# Patient Record
Sex: Male | Born: 1945 | Race: White | Hispanic: No | Marital: Married | Smoking: Never smoker
Health system: Southern US, Community
[De-identification: ages and names within clinical notes are randomized; demographics above are authoritative.]

## PROBLEM LIST (undated history)

## (undated) DIAGNOSIS — R519 Headache, unspecified: Secondary | ICD-10-CM

## (undated) DIAGNOSIS — J189 Pneumonia, unspecified organism: Secondary | ICD-10-CM

## (undated) DIAGNOSIS — I2510 Atherosclerotic heart disease of native coronary artery without angina pectoris: Secondary | ICD-10-CM

## (undated) DIAGNOSIS — I517 Cardiomegaly: Secondary | ICD-10-CM

## (undated) DIAGNOSIS — R062 Wheezing: Secondary | ICD-10-CM

## (undated) DIAGNOSIS — I10 Essential (primary) hypertension: Secondary | ICD-10-CM

## (undated) DIAGNOSIS — N471 Phimosis: Secondary | ICD-10-CM

## (undated) DIAGNOSIS — E785 Hyperlipidemia, unspecified: Secondary | ICD-10-CM

## (undated) DIAGNOSIS — R55 Syncope and collapse: Secondary | ICD-10-CM

## (undated) DIAGNOSIS — R0600 Dyspnea, unspecified: Secondary | ICD-10-CM

## (undated) DIAGNOSIS — K219 Gastro-esophageal reflux disease without esophagitis: Secondary | ICD-10-CM

## (undated) DIAGNOSIS — M1990 Unspecified osteoarthritis, unspecified site: Secondary | ICD-10-CM

## (undated) DIAGNOSIS — Z5189 Encounter for other specified aftercare: Secondary | ICD-10-CM

## (undated) DIAGNOSIS — Z973 Presence of spectacles and contact lenses: Secondary | ICD-10-CM

## (undated) DIAGNOSIS — C4491 Basal cell carcinoma of skin, unspecified: Secondary | ICD-10-CM

## (undated) DIAGNOSIS — I209 Angina pectoris, unspecified: Secondary | ICD-10-CM

## (undated) DIAGNOSIS — D179 Benign lipomatous neoplasm, unspecified: Secondary | ICD-10-CM

## (undated) DIAGNOSIS — A159 Respiratory tuberculosis unspecified: Secondary | ICD-10-CM

## (undated) HISTORY — DX: Hyperlipidemia, unspecified: E78.5

## (undated) HISTORY — PX: COLONOSCOPY: SHX174

## (undated) HISTORY — DX: Gastro-esophageal reflux disease without esophagitis: K21.9

## (undated) HISTORY — DX: Atherosclerotic heart disease of native coronary artery without angina pectoris: I25.10

## (undated) HISTORY — DX: Encounter for other specified aftercare: Z51.89

## (undated) HISTORY — DX: Essential (primary) hypertension: I10

## (undated) HISTORY — PX: JOINT REPLACEMENT: SHX530

## (undated) HISTORY — PX: CARDIAC CATHETERIZATION: SHX172

## (undated) HISTORY — PX: CORONARY ARTERY BYPASS GRAFT: SHX141

## (undated) HISTORY — DX: Respiratory tuberculosis unspecified: A15.9

## (undated) HISTORY — PX: BACK SURGERY: SHX140

## (undated) HISTORY — PX: EYE SURGERY: SHX253

## (undated) HISTORY — PX: CORONARY ANGIOPLASTY: SHX604

---
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
## 1996-03-10 HISTORY — PX: LUMBAR DISC SURGERY: SHX700

## 1999-01-17 ENCOUNTER — Other Ambulatory Visit: Admission: RE | Admit: 1999-01-17 | Discharge: 1999-01-17 | Payer: Self-pay | Admitting: Orthopedic Surgery

## 1999-03-11 HISTORY — PX: KNEE ARTHROSCOPY: SUR90

## 1999-04-25 ENCOUNTER — Encounter: Payer: Self-pay | Admitting: Orthopedic Surgery

## 1999-04-25 ENCOUNTER — Ambulatory Visit (HOSPITAL_COMMUNITY): Admission: RE | Admit: 1999-04-25 | Discharge: 1999-04-25 | Payer: Self-pay | Admitting: Orthopedic Surgery

## 2001-03-10 DIAGNOSIS — Z8719 Personal history of other diseases of the digestive system: Secondary | ICD-10-CM

## 2001-03-10 HISTORY — DX: Personal history of other diseases of the digestive system: Z87.19

## 2001-10-15 ENCOUNTER — Ambulatory Visit (HOSPITAL_COMMUNITY): Admission: RE | Admit: 2001-10-15 | Discharge: 2001-10-15 | Payer: Self-pay | Admitting: Internal Medicine

## 2001-10-15 ENCOUNTER — Encounter: Payer: Self-pay | Admitting: Internal Medicine

## 2004-03-08 ENCOUNTER — Inpatient Hospital Stay (HOSPITAL_COMMUNITY): Admission: EM | Admit: 2004-03-08 | Discharge: 2004-03-19 | Payer: Self-pay | Admitting: Emergency Medicine

## 2004-03-10 DIAGNOSIS — Z5189 Encounter for other specified aftercare: Secondary | ICD-10-CM

## 2004-03-10 HISTORY — DX: Encounter for other specified aftercare: Z51.89

## 2004-03-10 HISTORY — PX: OTHER SURGICAL HISTORY: SHX169

## 2004-04-05 ENCOUNTER — Encounter
Admission: RE | Admit: 2004-04-05 | Discharge: 2004-04-05 | Payer: Self-pay | Admitting: Thoracic Surgery (Cardiothoracic Vascular Surgery)

## 2004-04-08 ENCOUNTER — Encounter (HOSPITAL_COMMUNITY): Admission: RE | Admit: 2004-04-08 | Discharge: 2004-07-07 | Payer: Self-pay | Admitting: *Deleted

## 2004-12-23 ENCOUNTER — Ambulatory Visit: Payer: Self-pay | Admitting: Internal Medicine

## 2005-12-30 ENCOUNTER — Ambulatory Visit: Payer: Self-pay | Admitting: Internal Medicine

## 2006-03-10 DIAGNOSIS — Z860100 Personal history of colon polyps, unspecified: Secondary | ICD-10-CM

## 2006-03-10 HISTORY — DX: Personal history of colonic polyps: Z86.010

## 2006-03-10 HISTORY — DX: Personal history of colon polyps, unspecified: Z86.0100

## 2006-12-21 ENCOUNTER — Ambulatory Visit: Payer: Self-pay | Admitting: Internal Medicine

## 2006-12-29 ENCOUNTER — Ambulatory Visit: Payer: Self-pay | Admitting: Internal Medicine

## 2006-12-29 ENCOUNTER — Encounter: Payer: Self-pay | Admitting: Internal Medicine

## 2008-03-10 HISTORY — PX: ANGIOPLASTY: SHX39

## 2008-04-03 ENCOUNTER — Ambulatory Visit (HOSPITAL_COMMUNITY): Admission: RE | Admit: 2008-04-03 | Discharge: 2008-04-04 | Payer: Self-pay | Admitting: Cardiology

## 2008-06-14 ENCOUNTER — Observation Stay (HOSPITAL_COMMUNITY): Admission: AD | Admit: 2008-06-14 | Discharge: 2008-06-15 | Payer: Self-pay | Admitting: Cardiology

## 2010-03-10 HISTORY — PX: CATARACT EXTRACTION W/ INTRAOCULAR LENS  IMPLANT, BILATERAL: SHX1307

## 2010-06-19 LAB — CARDIAC PANEL(CRET KIN+CKTOT+MB+TROPI): Relative Index: 4.7 — ABNORMAL HIGH (ref 0.0–2.5)

## 2010-06-19 LAB — CBC
HCT: 42.1 % (ref 39.0–52.0)
HCT: 43.4 % (ref 39.0–52.0)
Hemoglobin: 15 g/dL (ref 13.0–17.0)
Hemoglobin: 15 g/dL (ref 13.0–17.0)
MCHC: 34.6 g/dL (ref 30.0–36.0)
MCHC: 35.6 g/dL (ref 30.0–36.0)
RBC: 4.35 MIL/uL (ref 4.22–5.81)
RDW: 13.1 % (ref 11.5–15.5)
RDW: 13.3 % (ref 11.5–15.5)

## 2010-06-19 LAB — BASIC METABOLIC PANEL
BUN: 14 mg/dL (ref 6–23)
Calcium: 8.8 mg/dL (ref 8.4–10.5)
GFR calc non Af Amer: >60 mL/min (ref >60)
Glucose, Bld: 112 mg/dL — ABNORMAL HIGH (ref 70–99)

## 2010-06-24 LAB — BASIC METABOLIC PANEL
BUN: 13 mg/dL (ref 6–23)
CO2: 26 mEq/L (ref 19–32)
GFR calc non Af Amer: >60 mL/min (ref >60)
Glucose, Bld: 93 mg/dL (ref 70–99)
Potassium: 3.7 mEq/L (ref 3.5–5.1)

## 2010-06-24 LAB — CBC
HCT: 42.8 % (ref 39.0–52.0)
MCHC: 34.8 g/dL (ref 30.0–36.0)
Platelets: 172 10*3/uL (ref 150–400)
RDW: 12.3 % (ref 11.5–15.5)

## 2010-07-23 NOTE — Cardiovascular Report (Signed)
NAMEOWYN, RAULSTON               ACCOUNT NO.:  [id number]   MEDICAL RECORD NO.:  [id number]          PATIENT TYPE:  INP   LOCATION:  2503                         FACILITY:  [facility]   PHYSICIAN:  Cristy Hilts. Jacinto Halim, MD       DATE OF BIRTH:  March 22, 1945   DATE OF PROCEDURE:  04/03/2008  DATE OF DISCHARGE:                            CARDIAC CATHETERIZATION   PROCEDURE PERFORMED:  PTCA and stenting of the saphenous vein graft to  obtuse marginal which has a skip graft supplying the diagonal 1 which is  a free RIMA that attaches from the saphenous vein graft to the diagonal  1.  There is stenosis in the ostium of this bypass graft.   INDICATIONS:  Mr. Terelle Dobler is a pleasant 65 year old gentleman who  has been complaining of exertional chest pain similar to his angina  pectoris prior to his bypass surgery.  He had undergone diagnostic  cardiac catheterization at [facility] on March 15, 2008.  This had revealed a high-grade stenosis in saphenous vein graft to the  obtuse marginal.  Given the fact that this supplied 2 vessels that is  the diagonal 1 and obtuse marginal, he is now brought to the cardiac  catheterization lab for elective angioplasty.   INTERVENTIONAL DATA:  Successful PTCA and stenting of the proximal  segment of the saphenous vein graft with implantation of a 4.0 x 15-mm  Driver stent which was deployed at peak of 14 atmospheric pressure.  The  stenosis was reduced from 80% to 0% with brisk TIMI 3 to TIMI 3 flow  maintained at the end of the procedure.   RECOMMENDATIONS:  The patient will be continued on aspirin and Plavix.  He will be discharged home in the morning if he remains stable.  A total  of 85 mL of contrast was utilized for diagnostic angiography.   As far as the right coronary artery stenosis is concerned, I reviewed  the cineangiograms with my colleagues, there is dual supply of the PDA  and PLA branches and the stenosis proximal to the graft  insertion does  not appear to be significant as this retrogradely fills the PLA branch.  Hence, this is to be left alone.   I suspect he will have significant improvement in his anginal symptoms.   TECHNIQUE OF THE PROCEDURE:  Under usual sterile precautions using a 7-  French right femoral arterial access, initially a 7-French hockey-stick  guide and then eventually a 7-French AL-1 and then a 7-French LCB  catheter was utilized to engage the saphenous vein graft.  Using  Angiomax for anticoagulation, a 190 cm x 0.014th of an inch Asahi  Prowater guidewire was utilized and the lesion was easily crossed.  The  lesion was predilated with a 3.5 x 12-mm Sprinter balloon at 14  atmospheric pressure followed by stenting with a 4.0 x 15-mm Driver  stent.  The same stent balloon was utilized for performing multiple  balloon inflations at a peak of 14 atmospheric pressure for 35-40  seconds.  Intracoronary nitroglycerin was administered.  Angiography was  performed.  Excellent  results were noted.  The guidewire was withdrawn.  Guide catheter disengaged and pulled out of body.   Right femoral arteriography was performed through the arterial access  sheath and access was closed with StarClose with excellent hemostasis.      Cristy Hilts. Jacinto Halim, MD  Electronically Signed     JRG/MEDQ  D:  04/03/2008  T:  04/04/2008  Job:  [id number]   cc:   Gaspar Garbe, M.D.

## 2010-07-23 NOTE — Discharge Summary (Signed)
[name], [name]               ACCOUNT NO.:  [id number]   MEDICAL RECORD NO.:  [id number]          PATIENT TYPE:  INP   LOCATION:  2504                         FACILITY:  [facility]   PHYSICIAN:  Cristy Hilts. Jacinto Halim, MD       DATE OF BIRTH:  Nov 08, 1945   DATE OF ADMISSION:  06/14/2008  DATE OF DISCHARGE:  06/15/2008                               DISCHARGE SUMMARY   DISCHARGE DIAGNOSES:  1. Recurrent angina despite medication titration.  2. Known coronary artery disease with history of CABG in 2006 with      LIMA to LAD, SVG to PDA, SVG to diagonal 1 and OM1, recent status      post PTCA and stenting of his  skip vein graft on April 03, 2008,      with implantation of non drug-eluting stent.  3. Recath secondary to recurrent angina despite medication titration      on June 14, 2008, electively revealing high grade and his distal      RCA and mid RCA with stenting using a Zion 3.0 x 18 and a Zion 3.5      x 15.  His RIMA to his diagonal was patent with patent stents on      April 03, 2008 and his SVG to his OM1 had some stenosis with a      valve abnormality.  His LIMA to his LAD was patent.  He did have 80-      90% in his proximal LAD.  4. Hyperlipidemia.  5. Hypertension.  6. ED.   DISCHARGE MEDICATIONS:  1. Prevacid 30 mg every day.  2. Metoprolol tartrate 25 mg every day.  3. Ramipril 5 mg every day.  4. Crestor 20 mg half every day.  5. Niaspan 1000 mg two every day.  6. Fish oil 1000 mg two every day.  7. Aspirin 325 mg a day.  8. Zetia 10 mg a day.  9. Nitroglycerin p.r.n. for chest discomfort.  10.Plavix 75 mg a day.  11.fish oil 1000 mg two a day.  12.Tylenol p.r.n.  13.Docusate sodium 100 mg a day.  14.CoQ10 50 mg twice a day.  15.Multivitamin and Vitamin C daily.   DISCHARGE INSTRUCTIONS:  The patient should do no strenuous activity,  pushing, pulling, or extended walking for 1 week.  He has an appointment  with Dr. Jacinto Halim on June 23, 2008 at 3:45.   HOSPITAL  COURSE:  Mr. Tobler is a 65 year old male who had known CAD  who was seen in my office by Dr. Jacinto Halim.  He is complaining of a chest  pain.  It was nitro-responsive chest pain like a band across his chest.  It was felt that he should undergo cardiac catheterization.  He was  having lifestyle-limiting chest pain.  He underwent cath on June 14, 2008 electively.  He was found to have high-grade to his distal and mid  RCA.  He had in his skip graft of SVG to his OM1.  He did have some area  of stenosis in vein valve that was somewhat obtrusive.  He underwent  stenting to his high grade RCA, and he did well.  He was seen by cardiac  rehab the following day.  His right groin was stable.  His blood  pressure was stable.  Dr. Jacinto Halim decided that he could be discharged  home.  If he continued to have chest pain, then he would pursue a PCI to  his skip vein graft to his OMs.      Lezlie Octave, N.P.      Cristy Hilts. Jacinto Halim, MD  Electronically Signed    BB/MEDQ  D:  06/15/2008  T:  06/16/2008  Job:  [id number]   cc:   Gaspar Garbe, M.D.

## 2010-07-26 NOTE — Discharge Summary (Signed)
NAMEANSAR, SKODA               ACCOUNT NO.:  [id number]   MEDICAL RECORD NO.:  [id number]          PATIENT TYPE:  INP   LOCATION:  2005                         FACILITY:  [facility]   PHYSICIAN:  Salvatore Decent. Dorris Fetch, M.D.DATE OF BIRTH:  14-Sep-1945   DATE OF ADMISSION:  03/08/2004  DATE OF DISCHARGE:  03/19/2004                                 DISCHARGE SUMMARY   HISTORY OF PRESENT ILLNESS:  This is a 65 year old white male with a past  medical history of hypertension, remote TB, surgical repair to herniated  disk, arthroscopic knee surgery, who presented to [facility] emergency  department with chest pain in the mid substernal region radiating to the  left chest and left arm.  It was associated with shortness of breath,  nausea, vomiting, and diaphoresis.  It was exertional, relieved with rest  after about 1 hour. The patient first developed symptoms approximately 3  weeks ago but they have become increasingly frequent and severe.  On initial  evaluation he was chest pain free after initial emergency department prompt  treatment with nitroglycerin.  He was felt to require further evaluation and  treatment for probable cardiac disease.   PAST MEDICAL HISTORY:  As noted above.   ALLERGIES:  CODEINE (causes nausea), PENICILLIN (causes rash).   MEDICATIONS ON ADMISSION:  1.  Lipitor 10 mg daily.  2.  Hydrochlorothiazide 25 mg daily.  3.  Prevacid 30 mg daily.  4.  Vitamin C 1000 mg daily.  5.  D.O.S. 100 mg b.i.d.  6.  Viagra p.r.n.  7.  Lamisil p.r.n.  8.  Ibuprofen p.r.n.  9.  Tylenol p.r.n.   For family history, social history, review of systems and physical exam  please see the history and physical done at the time of admission.   HOSPITAL COURSE:  The patient was admitted emergently for unstable angina  and rule out myocardial infarction. Beta blocker was initially held  secondary to heart rate in the low 60's. He was started on intravenous  heparin and nitroglycerin,  aspirin, continued on statin and was scheduled  for cardiac catheterization.  The patient did rule in for myocardial damage  by troponin with peak at 0.22.  He remained chest pain free up until  catheterization on March 12, 2004.  This showed significant multi-vessel  coronary artery disease. Please see the detailed catheterization report for  all details. Due to the findings, surgical consultation was obtained with  Viviann Spare C. Dorris Fetch, M.D. who evaluated the patient and studies. Please  see his consultation for further details, but he was noted to be an  acceptable candidate for surgical revascularization.  He was scheduled for  the procedure and on March 14, 2004 he was taken to the operating room  where he underwent the following procedure: Coronary artery bypass grafting  x4. The following grafts were placed:  1.  Left internal mammary artery to the LAD.  2.  Right internal mammary artery as a free graft to the diagonal.  3.  Saphenous vein graft to the obtuse marginal.  4.  Saphenous vein graft to the posterior descending.  The patient tolerated the procedure well and was taken to the surgical  intensive care unit in stable condition.   POSTOPERATIVE HOSPITAL COURSE:  The patient has done well. Significant  issues however were postoperative anemia for which he did require  transfusion.  He is felt to be clinically stable. He did not tolerate iron.  His most recent hemoglobin and hematocrit dated March 19, 2004 is 8.6 and  24.5.  He additionally has had some difficulty with nausea and medications  had been readjusted.  He does not appear to tolerate pain medications very  well due to this nausea.  Additionally as stated, the Niferex iron  supplement was discontinued.   Other laboratories are stable. BUN and creatinine are within normal limits  at 15 and 1.0 on March 18, 2004.  He has tolerated a gentle diuresis. All  incisions are healing well without signs of infection.  He has tolerated  routine pulmonary toilet and oxygen has been weaned, maintaining good oxygen  saturations on room air. His cardiac rhythm is normal sinus rhythm and he  has had no significant ectopy or dysrhythmia postoperatively. His  rehabilitation has gone nicely and he has tolerated routine cardiac  rehabilitation phase I modalities without problem. His current status is  felt to be quite stable for discharge on March 19, 2004.   DISCHARGE MEDICATIONS:  1.  Aspirin 325 mg daily.  2.  Lopressor 25 mg b.i.d.  3.  Zocor 80 mg daily.  4.  He is also instructed to resume his Prevacid and can resume his vitamins      as well.  5.  For pain Ultram 50 mg 1-2 q.6h p.r.n. He is instructed to take this with      food.   ACTIVITY:  He is given written instructions regarding activities, diet,  wound care and follow up.   FOLLOW UP:  Dr. Dorris Fetch in 3 weeks, the office will call with this.  He is also instructed to see his cardiologist, Dr. Domingo Sep in 2 weeks.   FINAL DIAGNOSES:  1.  Severe multi-vessel coronary artery disease as described. No status post      surgical revascularization.  He did have unstable angina on admission      with positive troponin I enzymes.  2.  Hypertension.  3.  Hyperlipidemia.  4.  Previous surgeries as described.   CONDITION ON DISCHARGE:  Stable and improved.       WEG/MEDQ  D:  03/19/2004  T:  03/19/2004  Job:  [id number]

## 2010-07-26 NOTE — Cardiovascular Report (Signed)
NAMEJAYTHAN, HINELY               ACCOUNT NO.:  [id number]   MEDICAL RECORD NO.:  [id number]          PATIENT TYPE:  INP   LOCATION:  4705                         FACILITY:  [facility]   PHYSICIAN:  Cristy Hilts. Jacinto Halim, MD       DATE OF BIRTH:  02-12-1946   DATE OF PROCEDURE:  03/12/2004  DATE OF DISCHARGE:                              CARDIAC CATHETERIZATION   REFERRING PHYSICIAN:  [facility] [address].   PROCEDURE PERFORMED:  1.  Left ventriculography.  2.  Selective right-to-left coronary arteriography.  3.  Unbypassed left internal mammary artery and right internal mammary      artery arteriography including visualization of right and left      subclavian.  4.  Abdominal aortogram.   INDICATION:  Mr. Coor is a 65 year old Caucasian male with a strong  family history of coronary artery disease, history of abdominal aortic  aneurysm in the family who was admitted to the hospital with unstable angina  with positive troponin, negative CPKs.  Multiple cardiac risk factors  includes hypertension, hyperlipidemia, and family history.  He was brought  to the cardiac catheterization lab to evaluate his coronary anatomy.  Abdominal aortography was performed because of family history of abdominal  aortic aneurysm and premature death secondary to ruptured aortic aneurysm.   HEMODYNAMIC DATA:  The left ventricular pressure were 121/210 with end-  diastolic pressure of 9 mmHg.  The aortic pressure was 115/73, with a mean  of 90 mmHg.  There was no pressure gradient across the aortic valve.   ANGIOGRAPHIC DATA:  1.  Left ventricle:  Left ventricular function was normal.  The ejection      fraction was estimated at 60%.  There was no significant mitral      regurgitation.  2.  Right coronary artery:  Right coronary artery is a large caliber vessel.      The ostium has a 70% stenosis. and there was damping of the 6 [place] JR-      4.  The distal RCA also has a 70% luminal irregularity.  It  gives off a      large PDA and PLA.  3.  Left main:  Left main is a large caliber vessel.  It is normal.  4.  Circumflex:  The circumflex is a large caliber vessel.  It gives origin      to a large first marginal #1.  The circumflex ostium has an 80 to 90%      stenosis and is hazy.  The obtuse marginal #1 has mid 70% stenosis.  5.  Left anterior descending:  The left anterior descending is a large      caliber vessel.  At the proximal segment, it has a shelf-like lesion and      has an 80% stenosis in its proximal segment.  It gives off a very large      diagonal #1.  The diagonal #1 has an ulcerated 90% stenosis in its      proximal segment.  The LAD itself after the origin of the diagonal has  90% and, just after the origin of diagonal #2, also has a 90% lesion.      The diagonal #2 ostium also has a 90% stenosis.  6.  Right innominate artery and LIMA:  The right innominate and LIMA are      widely patent.  7.  Left subclavian artery and LIMA:  The left subclavian and LIMA are      widely patent.  8.  Abdominal aortogram:  Abdominal aortogram revealed presence of two renal      arteries, one on either side, which are widely patent.  The LAD      bifurcation was grossly normal.  There was no evidence of abdominal      aortic aneurysm.   IMPRESSIONS:  1.  Normal left ventricular systolic function and ejection fraction of 60%.  2.  Complex two-vessel coronary artery disease including ostial 70% right      coronary artery, distal 70% right coronary artery stenosis.  Ostial      circumflex 80 to 90% stenosis.  Obtuse marginal #1 70% stenosis.  Ramus      intermediate has an 80% ostial stenosis; however, it is very small.  3.  Left anterior descending has complex stenotic lesions including proximal      80% long segment stenosis, large diagonal #1 which has an ulcerated 90%      proximal stenosis.  After the origin of the diagonal #1, the left      anterior descending itself has a 90%  stenosis, and the left anterior      descending after the origin of the diagonal #2 has another 90% stenosis,      and the diagonal #2 has an ostial 90% stenosis.   Given his coronary anatomy, recommend coronary artery bypass grafting,  continue aggressive risk factor modification.  CVTS has been called.   TECHNIQUE OF THE PROCEDURE:  Under the usual sterile fashion using a 6  French right femoral artery access, a 6 [place] multipurpose PT catheter was  advanced to the ascending aorta over an 0.035-inch J-wire.  The catheter was  gently advanced to the left ventricle.  The left ventricular pressure was  monitored.  Hand contrast injection of the left ventricle was performed in  both the LAO and RAO projections.  The catheter was flushed with saline,  pulled back into the ascending aorta and pressure gradient across the aortic  valve was monitored.  The right coronary artery was selectively engaged and  angiography was performed.  Then the catheter was pulled back into the  abdominal aorta and abdominal aortogram was performed.  A 6 French Judkins  left 4 diagnostic catheter was utilized to engage the left mammary coronary  artery and angiography was performed.  Then, the catheter was pulled out of  the body and a 6 [place] Judkins right 4 diagnostic catheter was utilized to  engage the right coronary artery and angiography was performed.  This same  catheter was utilized to engage the right innominate and left subclavian  artery and angiography was performed.  Then, the catheter was pulled  from  the body in the usual fashion.  The patient tolerated the procedure.  No  immediate was complication noted.      Kirk Ruths  D:  03/12/2004  T:  03/12/2004  Job:  [id number]

## 2010-07-26 NOTE — Op Note (Signed)
[name], [name]               ACCOUNT NO.:  [id number]   MEDICAL RECORD NO.:  [id number]          PATIENT TYPE:  INP   LOCATION:  2005                         FACILITY:  [facility]   PHYSICIAN:  Salvatore Decent. 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   PROCEDURES:  1.  Median sternotomy.  2.  Extracorporeal circulation.  3.  Coronary artery bypass grafting times four (left internal mammary artery      to the LAD, free right internal mammary artery to first diagonal,      saphenous vein graft to obtuse marginal one, saphenous vein graft to      posterior descending artery).   SURGEON:  Salvatore Decent. Dorris Fetch, M.D.   ASSISTANT:  Toribio Harbour, N.P.   ANESTHESIA:  General.   FINDINGS:  OM1 fair quality, posterior descending artery poor quality,  diagonal and LAD good quality, internal mammary artery is good quality,  saphenous vein segments used were fair quality. She had a vein which  trifurcated on both sides with very little vein which was of adequate size  and caliber for use.   CLINICAL NOTE:  The patient is a 65 year old gentleman who presented with  new onset angina which progressed to unstable angina. He underwent  catheterization which showed complex three vessel coronary artery disease  which was not amenable to percutaneous intervention.  The patient was  referred for coronary artery bypass grafting.  The indications, risks,  benefits and alternatives were discussed in detail with the patient. He  understood and accepted the risks and agreed to proceed.   DESCRIPTION OF PROCEDURE:  The patient was brought to the preoperative  holding area on March 14, 2004.  There lines were placed to  monitor  arterial, central venous and pulmonary arterial pressure.  Intravenous  antibiotics were administered.  The patient was taken to the operating room,  anesthetized and intubated. A Foley catheter was placed.  The chest, abdomen  and legs were prepped and draped in the usual fashion.   A median sternotomy was performed.  The left internal mammary artery was  harvested using standard technique.  Simultaneously an incision was made in  the medial aspect of the right leg at the level of the knee.  The greater  saphenous vein was explored.  There was a small vein which was very  superficial and as the vein was traced proximally and distally just below  the knee there was a confluence of three branches which came together and  then split again as three separate branches.  None of these branches were  large enough to harvest individually.  Therefore, an incision was made in  the medial aspect of the left leg and a similar anatomic finding was present  there as well.  The vein was large at the ankle and appeared to be of  good  quality.  An incision was made at the ankle overlying the saphenous vein and  it was harvested from the ankle using standard open technique. As the  incision was lengthened approximately five to six inches above the ankle the  vein trifurcated.  An incision then was made in the right groin and at the  saphenofemoral junction the vein was of good quality.  As it was dissected  downward the vein trifurcated and only a very short segment of this was  usable.  The left ankle was similarly explored and there was very little  usable vein there.  The left groin was explored and there was adequate vein  for posterior descending graft.  The segments from the right ankle and groin  were used as a composite graft to the obtuse marginal.  The patient was  heparinized prior to dividing the distal end of the left mammary artery.  This was a good quality vessel with good flow. The  right internal mammary  artery then was harvested.  After harvesting the right internal mammary  artery which had a relatively high bifurcation, it was seen to be too short  to reach any of the target vessels as a pedicle graft.  Therefore, it was  divided proximally and the stump was suture ligated with a 2-0 silk suture.   The pericardium was opened and the ascending aorta was inspected and  palpated.  There was no palpable atherosclerotic disease.  After ensuring  adequate anticoagulation with ACT measurement the aorta was cannulated via  concentric 2-0 Ethibond non-pledgeted pursestring sutures.  A dual stage  venous cannula was placed via pursestring suture in the right atrial  appendage.  Cardiopulmonary bypass was instituted and the patient was cooled  to 32 degrees Celsius.  The coronary arteries were inspected and anastomotic  sites were chosen.  The conduits were inspected and cut to length.  A foam  pad was placed in the pericardium to protect the left phrenic nerve.  A  temperature probe was placed in the myocardial septum and a cardioplegia  cannula was placed in the ascending aorta.   The aorta was cross clamped and the left ventricle was emptied via the  aortic root.  Cardiac arrest was achieved with a combination of cold  antegrade blood cardioplegia and topical iced saline.  After achieving a  complete diastolic arrest and adequate myocardial septal cooling, the  following distal anastomoses were performed.   First, a reverse saphenous vein graft was placed inside the posterior  descending. This was a 1.0 mm poor quality target.  It was diffusely  diseased.  The graft was placed in the mid portion of the vessel beyond all  the obvious plaque, but it was a very small and poor quality vessel beyond  that.  The anastomosis was performed with a running 7-0 Prolene suture.  There was adequate flow through the graft.  Cardioplegia was administered and there was good  hemostasis.   Next, a reverse saphenous vein graft was placed end to side to the first  obtuse marginal branch of the left circumflex coronary artery.  This vessel  had a 70% stenosis.  There also was an ostial 80 to 90% circumflex stenosis.  The OM1 was a 1.5 mm vessel. It was of fair quality and it was diffusely  diseased.  The vein graft was placed with a running 7-0 Prolene suture in an  end to side fashion.  There was good flow through this graft.  Cardioplegia  was administered.  There was good hemostasis.   Next, the distal end of the free right internal mammary artery was  spatulated.  It was anastomosed end to side to the first diagonal.  The  first diagonal branch was a 1.5 mm good quality target.  It was definitely a  better target than either the posterior descending or the OM and that is why  the right mammary was used for it.  The anastomosis was performed with a  running 8-0 Prolene suture.  The anastomosis was probed proximally and  distally prior to tying the suture.  Cardioplegia was administered and there  was good back bleeding from the graft.   Next the left internal mammary artery was brought through a window in the  pericardium. The distal end was spatulated and then was anastomosed end to  side to the distal LAD.  The LAD was a 1.5 mm good quality target.  After  completion of the mammary to LAD anastomosis, the bulldog clamp was briefly  removed to inspect for hemostasis.  Immediate and rapid septal re-warming  was noted.  The bulldog clamp was replaced.  The mammary pedicle was tacked  to the epicardial surface of the heart with 6-0 Prolene sutures.   Additional cardioplegia was administered.  The vein grafts in the right  internal mammary artery were cut to length.  The proximal vein graft  anastomoses were performed to 4.4 mm punch aortotomies with running 6-0  Prolene sutures. At the completion of the final vein graft anastomosis the  patient was placed in  Trendelenburg position.  The aortic root was de-aired,  lidocaine was administered and re-warming was begun. After de-airing the  aortic root the aortic cross clamp was removed.  Total cross clamp time was  67 minutes.   Bulldog clamps were placed proximally and distally on the obtuse marginal  vein graft.  A longitudinal venotomy was performed and the proximal free  right mammary anastomosis was performed end to side to the vein graft with a  running 7-0 Prolene suture. This anastomosis was de-aired and the bulldog  clamps were removed.  All proximal and distal anastomoses were inspected for  hemostasis.  Epicardial pacing wires were placed on the right ventricle and  right atrium.  When the patient had been re-warmed to a core temperature of  37.0 degrees Celsius, he was weaned from cardiopulmonary bypass without  difficulty.  The total bypass time was 182 minutes.  A test dose of Protamine was administered and was well tolerated. The atrial  and aortic cannulae were removed.  The remainder of the Protamine was  administered without incident.  The chest was irrigated with one liter of  warm normal saline containing 1 gram of vancomycin.  Hemostasis was  achieved.  The pericardium was reapproximated with interrupted 3-0 silk  sutures and came together easily without tension.  The sternum was closed  with heavy gauge stainless steel wires.  The remainder of the incisions were  closed in standard fashion.  Subcuticular closures were used for the skin.  All sponge, needle and instrument counts were correct at the end of the  procedure.  There were no intraoperative complications.  The patient was  taken from the operating room to the surgical intensive care unit in  critical but stable condition.       SCH/MEDQ  D:  03/18/2004  T:  03/18/2004  Job:  [id number]   cc:   Dani Gobble, MD  Fax:  [phone] 

## 2010-07-26 NOTE — Consult Note (Signed)
[name], [name]               ACCOUNT NO.:  [id number]   MEDICAL RECORD NO.:  [id number]          PATIENT TYPE:  INP   LOCATION:  4705                         FACILITY:  [facility]   PHYSICIAN:  Salvatore Decent. Dorris Fetch, M.D.DATE OF BIRTH:  March 29, 1945   DATE OF CONSULTATION:  03/12/2004  DATE OF DISCHARGE:                                   CONSULTATION   REASON FOR CONSULTATION:  Three-vessel coronary disease with unstable  angina.   HISTORY OF PRESENT ILLNESS:  [name] is a 65 year old gentleman with a  history of hypertension and dyslipidemia.  He presented on March 08, 2004  with a complaint of mid sternal chest pain radiating to his left arm.  He  did have shortness of breath as well as some nausea and diaphoresis.  He had  first noticed this pain about three weeks prior to admission.  This would  come on with exertion.  It was localized to his chest.  It was a tightness,  a pressure-type sensation associated with shortness of breath and relieved  with rest.  He had noted that this had gradually increased over time.  The  day of presentation, he had an episode with only mild-to-moderate exertion  which began in his chest and radiated to his arm.  It was severe and lasted  approximately an hour.  It was accompanied by the symptoms previously  mentioned.  He presented to [facility] emergency department and was given  nitroglycerin and had prompt relief of his discomfort.  He has no prior  cardiac history.  He was admitted and ruled out for myocardial infarction.  He was treated with aspirin and heparin.  He also was given Plavix on  December 31.  The cardiac catheterization was performed today which showed a  good left ventricular function.  There was severe three-vessel disease with  both ostial LAD, circumflex, and right coronary artery disease.  There was  an ulcerated 90% plaque in the large first diagonal branch off the LAD.  The  patient is currently painfree.   PAST  MEDICAL HISTORY:  Significant for hypertension, dyslipidemia,  gastroesophageal reflux.  Previous surgeries for herniated disk, knee  arthroscopy.  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  He denies any diabetes, stroke,  TIA, DVT, PE.   MEDICATIONS ON ADMISSION:  1.  Lipitor 10 mg daily.  2.  Hydrochlorothiazide 25 mg daily.  3.  Prevacid 30 mg daily.  4.  Vitamin C 1000 mg daily.  5.  He was using Viagra p.r.n.  6.  Tylenol p.r.n.  7.  Stool softener.  8.  Lamisil.   ALLERGIES:  He is allergic to CODEINE, which causes nausea, and PENICILLIN,  which causes a rash.   FAMILY HISTORY:  Significant for a stroke, coronary artery disease,  tuberculosis, and abdominal aortic aneurysm.   SOCIAL HISTORY:  He is married.  He works as a Consulting civil engineer.  He has  three children.  He has not used tobacco or alcohol.   REVIEW OF SYSTEMS:  Overall good health until this recent event.  He does  have some occasional  back pain, some arthritis-type complaints.  No recent  significant illnesses.  He denies any recent change in bowel or bladder  habits other than some mild reflux symptoms that are stable.   PHYSICAL EXAMINATION:  VITAL SIGNS:  Listed on the flow sheet.  GENERAL:  [name] is a 65 year old white male in no acute distress.  He  is well-developed and well-nourished in no acute distress.  HEENT:  Unremarkable.  NECK:  Supple without thyromegaly, adenopathy, or bruits.  LUNGS:  Clear with equal breath sounds bilaterally.  HEART:  Regular rate and rhythm.  Normal S1 and S2.  There is a 1-2/6  systolic murmur at the left upper sternal border.  ABDOMEN:  Soft and nontender.  EXTREMITIES:  Pulses 2+ throughout.  There is a slightly delayed refilling  with Allen's test on the left side.  There is brisk capillary refill  throughout and 2+ pulses throughout.  NEUROLOGIC:  Alert and oriented x 3.  Grossly intact.  SKIN:  Warm and dry.   LABORATORY DATA:  EKG showed sinus rhythm with no acute  ischemic changes.   White count 6.7, hematocrit 45, platelets 290.  Sodium 136, potassium 3.5,  BUN and creatinine 14 and 1.1.  His glucose was 94.  PT was 12.3.  PTT 30.  LFTs and albumin within normal limits.  CK was 105 with an MB of 3.0.  Troponin was 0.26.   IMPRESSION:  [name] is a 65 year old gentleman who presents with new  onset of progressive angina, which has now progressed to unstable angina.  He has had chest pain with no exertion while in the hospital.  He has severe  three-vessel disease at catheterization, and coronary artery bypass grafting  is indicating for survival benefit as well as relief of symptoms.  He has  good target from the left anterior descending artery distribution as well as  the first obtuse marginal.  His distal circumflex does not appear to be  graftable.  His posterior descending is graftable but does have what appears  to be more diffuse disease.  He certainly is a good candidate for coronary  artery bypass grafting.  Given his young age, would plan to use bilateral  mammary arteries.   I think any additional benefit from radial artery in this case would be  negligible.  I discussed in details with [name] the indications, risks,  benefits and alternatives as well as the nature and extent of the operation,  including incisions to be used.  He understands the palliative nature of the  operation.  He may need additional procedures in the future in the form of  catheterization, angioplasty, and/or bypass surgery.  He understands the  risks include but  are not limited to death, stroke, MI, DVT, PE, bleeding, possible need for  transfusions, infections, as well as other organ system dysfunctions,  including respiratory, renal, or GI complications.  He understands and  accepts these risks and agrees to proceed.  We will plan for surgery at the  earliest possible date, which will be March 14, 2004.      SCH/MEDQ  D:  03/12/2004  T:   03/12/2004  Job:  [id number]   cc:   Cristy Hilts. Jacinto Halim, MD  [address]  [place]  [place] [address]  Fax: [phone]

## 2010-07-26 NOTE — Assessment & Plan Note (Signed)
[facility]                           GASTROENTEROLOGY OFFICE NOTE   [name], [name]                      MRN:          [id number]  DATE:12/30/2005                            DOB:          [birth year]    HISTORY:  The patient presents today for followup.  He is a 65 year old with  a history of gastroesophageal reflux disease complicated by erosive  esophagitis and peptic stricture, which has required prior esophageal  dilation in August of 2003.  Since that time, he has been maintained on  Prevacid 30 mg daily.  He was last evaluated December 23, 2004, and was doing  well.  Since that time, he has continued on Prevacid 30 mg daily.  In recent  months, he has noted some breakthrough heartburn, particularly at night.  If  he takes Tums at night, this symptom is relieved.  He denies recurrent  dysphagia.  He has had weight gain over the past year.  He also has a family  history of colon cancer in his father and possibly his mother.  He is due  for a repeat screen next year.  No lower GI complaints, such as change in  bowel habits or bleeding.   CURRENT MEDICATIONS:  1. Prevacid 30 mg daily.  2. Toprol XL 50 mg daily.  3. Altace 5 mg daily.  4. Crestor 20 mg daily.  5. TriCor 48 mg daily.  6. Niaspan 1000 mg daily.  7. Aspirin 325 mg daily.  8. Fish oil 2 g daily.  9. Multivitamin 1 daily.  10.Plavix 75 mg daily.   PHYSICAL EXAM:  Well-appearing male in no acute distress.  Blood pressure 126/70, heart rate is 68, weight is 192 pounds (increased 16  pounds).  HEENT:  Sclerae anicteric.  LUNGS:  Clear.  HEART:  Regular.  ABDOMEN:  Soft without tenderness, mass, or hernia.  Good bowel sounds  heard.   IMPRESSION:  1. Gastroesophageal reflux disease with a history of ulcerative      esophagitis and peptic stricture.  The patient is currently      experiencing breakthrough symptoms on 30 mg of Prevacid daily.  I      suspect this is due to  his weight gain.  No recurrent dysphagia.  2. Family history of colon cancer in a first degree relative.  Due for      followup screening in October of 2008.   RECOMMENDATIONS:  1. Continue Prevacid 30 mg daily.  2. Reflux precautions with attention to weight loss.  3. Okay to use Tums at night p.r.n.  4. Screening colonoscopy in October of 2008.  5. Office followup in 1 year unless interval questions or problems.            ______________________________  Wilhemina Bonito. Eda Keys., MD      JNP/MedQ  DD:  12/30/2005  DT:  12/31/2005  Job #:  [id number]

## 2011-03-11 DIAGNOSIS — K5790 Diverticulosis of intestine, part unspecified, without perforation or abscess without bleeding: Secondary | ICD-10-CM

## 2011-03-11 HISTORY — DX: Diverticulosis of intestine, part unspecified, without perforation or abscess without bleeding: K57.90

## 2011-12-22 ENCOUNTER — Encounter: Payer: Self-pay | Admitting: Internal Medicine

## 2011-12-25 ENCOUNTER — Encounter: Payer: Self-pay | Admitting: Internal Medicine

## 2012-02-03 ENCOUNTER — Encounter: Payer: Self-pay | Admitting: Internal Medicine

## 2012-02-03 ENCOUNTER — Ambulatory Visit (AMBULATORY_SURGERY_CENTER): Payer: Medicare Other | Admitting: *Deleted

## 2012-02-03 VITALS — Ht 67.0 in | Wt 200.0 lb

## 2012-02-03 DIAGNOSIS — Z1211 Encounter for screening for malignant neoplasm of colon: Secondary | ICD-10-CM

## 2012-02-03 MED ORDER — MOVIPREP 100 G PO SOLR: ORAL | Status: DC

## 2012-02-24 ENCOUNTER — Ambulatory Visit (AMBULATORY_SURGERY_CENTER): Payer: Medicare Other | Admitting: Internal Medicine

## 2012-02-24 ENCOUNTER — Encounter: Payer: Self-pay | Admitting: Internal Medicine

## 2012-02-24 VITALS — BP 123/69 | HR 46 | Temp 97.7°F | Resp 14 | Ht 67.0 in | Wt 200.0 lb

## 2012-02-24 DIAGNOSIS — Z86010 Personal history of colonic polyps: Secondary | ICD-10-CM

## 2012-02-24 DIAGNOSIS — Z1211 Encounter for screening for malignant neoplasm of colon: Secondary | ICD-10-CM

## 2012-02-24 MED ORDER — SODIUM CHLORIDE 0.9 % IV SOLN: 500.0000 mL | INTRAVENOUS | Status: DC

## 2012-02-24 NOTE — Progress Notes (Signed)
Patient did not experience any of the following events: a burn prior to discharge; a fall within the facility; wrong site/side/patient/procedure/implant event; or a hospital transfer or hospital admission upon discharge from the facility. (G8907) Patient did not have preoperative order for IV antibiotic SSI prophylaxis. (G8918)  

## 2012-02-24 NOTE — Patient Instructions (Addendum)
Mild diverticulosis.  Otherwise a normal colon.  Recommend repeat colonoscopy in 10 years.    YOU HAD AN ENDOSCOPIC PROCEDURE TODAY AT THE [facility]: Refer to the procedure report that was given to you for any specific questions about what was found during the examination.  If the procedure report does not answer your questions, please call your gastroenterologist to clarify.  If you requested that your care partner not be given the details of your procedure findings, then the procedure report has been included in a sealed envelope for you to review at your convenience later.  YOU SHOULD EXPECT: Some feelings of bloating in the abdomen. Passage of more gas than usual.  Walking can help get rid of the air that was put into your GI tract during the procedure and reduce the bloating. If you had a lower endoscopy (such as a colonoscopy or flexible sigmoidoscopy) you may notice spotting of blood in your stool or on the toilet paper. If you underwent a bowel prep for your procedure, then you may not have a normal bowel movement for a few days.  DIET: Your first meal following the procedure should be a light meal and then it is ok to progress to your normal diet.  A half-sandwich or bowl of soup is an example of a good first meal.  Heavy or fried foods are harder to digest and may make you feel nauseous or bloated.  Likewise meals heavy in dairy and vegetables can cause extra gas to form and this can also increase the bloating.  Drink plenty of fluids but you should avoid alcoholic beverages for 24 hours.  ACTIVITY: Your care partner should take you home directly after the procedure.  You should plan to take it easy, moving slowly for the rest of the day.  You can resume normal activity the day after the procedure however you should NOT DRIVE or use heavy machinery for 24 hours (because of the sedation medicines used during the test).    SYMPTOMS TO REPORT IMMEDIATELY: A gastroenterologist can be  reached at any hour.  During normal business hours, 8:30 AM to 5:00 PM Monday through Friday, call [phone].  After hours and on weekends, please call the GI answering service at [phone] who will take a message and have the physician on call contact you.   Following lower endoscopy (colonoscopy or flexible sigmoidoscopy):  Excessive amounts of blood in the stool  Significant tenderness or worsening of abdominal pains  Swelling of the abdomen that is new, acute  Fever of 100F or higher   FOLLOW UP: If any biopsies were taken you will be contacted by phone or by letter within the next 1-3 weeks.  Call your gastroenterologist if you have not heard about the biopsies in 3 weeks.  Our staff will call the home number listed on your records the next business day following your procedure to check on you and address any questions or concerns that you may have at that time regarding the information given to you following your procedure. This is a courtesy call and so if there is no answer at the home number and we have not heard from you through the emergency physician on call, we will assume that you have returned to your regular daily activities without incident.  SIGNATURES/CONFIDENTIALITY: You and/or your care partner have signed paperwork which will be entered into your electronic medical record.  These signatures attest to the fact that that the information above on  your After Visit Summary has been reviewed and is understood.  Full responsibility of the confidentiality of this discharge information lies with you and/or your care-partner.

## 2012-02-24 NOTE — Op Note (Signed)
[facility] [address] [place], [address]   COLONOSCOPY PROCEDURE REPORT  PATIENT: [name], [name]  MR#: [id number] BIRTHDATE: 1945-05-29 , 66  yrs. old GENDER: Male ENDOSCOPIST: Roxy Cedar, MD REFERRED HQ:[id number] Program Recall PROCEDURE DATE:  02/24/2012 PROCEDURE:   Colonoscopy, surveillance ASA CLASS:   Class II INDICATIONS:Patient's personal history of adenomatous colon polyps. Small adenoma 12-2006 MEDICATIONS: MAC sedation, administered by CRNA and propofol (Diprivan) 200mg  IV  DESCRIPTION OF PROCEDURE:   After the risks benefits and alternatives of the procedure were thoroughly explained, informed consent was obtained.  A digital rectal exam revealed no abnormalities of the rectum.   The LB [id number]  endoscope was introduced through the anus and advanced to the cecum, which was identified by both the appendix and ileocecal valve. No adverse events experienced.   The quality of the prep was adequate, using MoviPrep  The instrument was then slowly withdrawn as the colon was fully examined.      COLON FINDINGS: Mild diverticulosis was noted in the sigmoid colon. The colon was otherwise normal.  There was no  inflammation, polyps or cancers .  Retroflexed views revealed internal hemorrhoids. The time to cecum=3 minutes 29 seconds.  Withdrawal time=9 minutes 24 seconds.  The scope was withdrawn and the procedure completed. COMPLICATIONS: There were no complications.  ENDOSCOPIC IMPRESSION: 1.   Mild diverticulosis was noted in the sigmoid colon 2.   The colon was otherwise normal  RECOMMENDATIONS: 1. Continue current colorectal screening recommendations for "routine risk" patients with a repeat colonoscopy in 10 years.   eSigned:  Roxy Cedar, MD 02/24/2012 10:32 AM   cc: Guerry Bruin, MD and The Patient

## 2012-02-25 ENCOUNTER — Telehealth: Payer: Self-pay

## 2012-02-25 NOTE — Telephone Encounter (Signed)
  Follow up Call-  Call back number 02/24/2012  Post procedure Call Back phone  # [phone]  Permission to leave phone message Yes     Patient questions:  Do you have a fever, pain , or abdominal swelling? no Pain Score  0 *  Have you tolerated food without any problems? yes  Have you been able to return to your normal activities? yes  Do you have any questions about your discharge instructions: Diet   no Medications  no Follow up visit  no  Do you have questions or concerns about your Care? no  Actions: * If pain score is 4 or above: No action needed, pain <4.

## 2013-10-26 ENCOUNTER — Encounter (HOSPITAL_BASED_OUTPATIENT_CLINIC_OR_DEPARTMENT_OTHER): Payer: Self-pay | Admitting: *Deleted

## 2013-10-26 NOTE — H&P (Signed)
  [facility] [address] [place], [place] [address] [phone] A Division of [facility]  Ninetta Lights, M.D.   Robert A. Noemi Chapel, M.D.   Faythe Casa, M.D.   Johnny Bridge, M.D.   Almedia Balls, M.D Ernesta Amble. Percell Miller, M.D.  Joseph Pierini, M.D.  Lanier Prude, M.D.    Verner Chol, M.D. Mary L. Fenton Malling, PA-C  Kirstin A. Shepperson, PA-C  Josh Laredo, PA-C [place], [place]   RE: Ming, [place]   [id number]      DOB: May 08, 1945 PROGRESS NOTE: 10-19-13 Reason for visit:  Referral from Dr. Alfonso Ramus for right triceps tendon rupture. Date of injury: 10/06/13. History of present illness:  Mr. [name] was trying to close a window  and as he was pushing down on it he felt a  pop and immediate pain at his posterior elbow. He had x-rays which were negative. He had a recent MRI that demonstrated a majority rupture of his triceps tendon there is some small amount of medial fibers intact. MRI also showed tearing of his mobile wad tendon fibers but this is partial thickness.  He has had pain and difficulty with activity, swelling has come down.   Please see associated documentation for this clinic visit for further past medical, family, surgical and social history, review of systems, and exam findings as this was reviewed by me.  EXAMINATION: Well appearing male in no apparent distress. Right upper extremity has palpable defect at his triceps tendon insertion. He has active extension of roughly 3/5 he has tenderness at his lateral epicondyle but no palpable defect he has active wrist with some pain but good strength. He has lack of full extension of roughly 30 degrees.  IMAGING: Review of his MRI demonstrates almost complete tear of his triceps tendon there are a few intact medial fibers. Small amount of tearing and inflammation the origin of his extensor tendon of the wrist.  ASSESSMENT/PLAN: 1. He has a  partial injury to his mobile wad tendon and almost complete tear of triceps tendon. 2. I think the lateral epicondyle we will observe and do therapy for that.  3. For his triceps tendon I discussed we could consider non-operative treatment, I think he would be limited in his motion with extension and weakness. He is working and extremely active and does not want to accept residual weakness so he would like to have a repair of his triceps tendon I think this is a reasonable option and I will set him up for this. Discussed risks benefits and possible complications and he would like to go forward.  Ernesta Amble.  Percell Miller, M.D.  Electronically verified by Ernesta Amble. Percell Miller, M.D. TDM:kah D 10-19-13 T 10-21-13

## 2013-10-26 NOTE — Progress Notes (Signed)
Pt working-saw dr ganji-cleared for surgery-called for notes-no resp problems

## 2013-10-26 NOTE — Progress Notes (Signed)
10/26/13 0935  OBSTRUCTIVE SLEEP APNEA  Have you ever been diagnosed with sleep apnea through a sleep study? No  Do you snore loudly (loud enough to be heard through closed doors)?  0  Do you often feel tired, fatigued, or sleepy during the daytime? 0  Has anyone observed you stop breathing during your sleep? 0  Do you have, or are you being treated for high blood pressure? 1  BMI more than 35 kg/m2? 0  Age over 69 years old? 1  Neck circumference greater than 40 cm/16 inches? 1  Gender: 1  Obstructive Sleep Apnea Score 4  Score 4 or greater  Results sent to PCP

## 2013-10-27 ENCOUNTER — Encounter (HOSPITAL_BASED_OUTPATIENT_CLINIC_OR_DEPARTMENT_OTHER): Payer: Self-pay

## 2013-10-27 ENCOUNTER — Encounter (HOSPITAL_BASED_OUTPATIENT_CLINIC_OR_DEPARTMENT_OTHER)
Admission: RE | Disposition: A | Discharge status: Home / Self Care | Payer: Self-pay | Source: Ambulatory Visit | Attending: Orthopedic Surgery

## 2013-10-27 ENCOUNTER — Ambulatory Visit (HOSPITAL_BASED_OUTPATIENT_CLINIC_OR_DEPARTMENT_OTHER)
Admission: RE | Admit: 2013-10-27 | Discharge: 2013-10-27 | Disposition: A | Discharge status: Home / Self Care | Payer: Worker's Compensation | Source: Ambulatory Visit | Attending: Orthopedic Surgery | Admitting: Orthopedic Surgery

## 2013-10-27 ENCOUNTER — Encounter (HOSPITAL_BASED_OUTPATIENT_CLINIC_OR_DEPARTMENT_OTHER): Payer: Worker's Compensation | Admitting: Anesthesiology

## 2013-10-27 ENCOUNTER — Ambulatory Visit (HOSPITAL_BASED_OUTPATIENT_CLINIC_OR_DEPARTMENT_OTHER): Payer: Worker's Compensation | Admitting: Anesthesiology

## 2013-10-27 DIAGNOSIS — Z79899 Other long term (current) drug therapy: Secondary | ICD-10-CM | POA: Insufficient documentation

## 2013-10-27 DIAGNOSIS — I2510 Atherosclerotic heart disease of native coronary artery without angina pectoris: Secondary | ICD-10-CM | POA: Insufficient documentation

## 2013-10-27 DIAGNOSIS — K219 Gastro-esophageal reflux disease without esophagitis: Secondary | ICD-10-CM | POA: Insufficient documentation

## 2013-10-27 DIAGNOSIS — M249 Joint derangement, unspecified: Secondary | ICD-10-CM | POA: Insufficient documentation

## 2013-10-27 DIAGNOSIS — I10 Essential (primary) hypertension: Secondary | ICD-10-CM | POA: Insufficient documentation

## 2013-10-27 HISTORY — DX: Unspecified osteoarthritis, unspecified site: M19.90

## 2013-10-27 HISTORY — PX: TENDON RECONSTRUCTION: SHX2487

## 2013-10-27 LAB — POCT I-STAT, CHEM 8
BUN: 18 mg/dL (ref 6–23)
CHLORIDE: 102 meq/L (ref 96–112)
Calcium, Ion: 1.27 mmol/L (ref 1.13–1.30)
Creatinine, Ser: 1.2 mg/dL (ref 0.50–1.35)
Glucose, Bld: 117 mg/dL — ABNORMAL HIGH (ref 70–99)
HEMATOCRIT: 46 % (ref 39.0–52.0)
HEMOGLOBIN: 15.6 g/dL (ref 13.0–17.0)
Potassium: 4.2 mEq/L (ref 3.7–5.3)
SODIUM: 137 meq/L (ref 137–147)
TCO2: 26 mmol/L (ref 0–100)

## 2013-10-27 SURGERY — RECONSTRUCTION, TENDON OR LIGAMENT, ELBOW
Anesthesia: Regional | Site: Arm Upper | Laterality: Right

## 2013-10-27 MED ORDER — ACETAMINOPHEN 500 MG PO TABS
1000.0000 mg | ORAL_TABLET | Freq: Once | ORAL | Status: AC
  Administered 2013-10-27: 1000 mg via ORAL

## 2013-10-27 MED ORDER — DEXAMETHASONE SODIUM PHOSPHATE 4 MG/ML IJ SOLN
INTRAMUSCULAR | Status: DC | PRN
  Administered 2013-10-27: 10 mg via INTRAVENOUS

## 2013-10-27 MED ORDER — FENTANYL CITRATE 0.05 MG/ML IJ SOLN
INTRAMUSCULAR | Status: AC
  Filled 2013-10-27: qty 2

## 2013-10-27 MED ORDER — MIDAZOLAM HCL 2 MG/2ML IJ SOLN
INTRAMUSCULAR | Status: AC
  Filled 2013-10-27: qty 2

## 2013-10-27 MED ORDER — LACTATED RINGERS IV SOLN
INTRAVENOUS | Status: DC
  Administered 2013-10-27: 09:00:00 via INTRAVENOUS

## 2013-10-27 MED ORDER — DEXTROSE-NACL 5-0.45 % IV SOLN: 100.0000 mL/h | INTRAVENOUS | Status: DC

## 2013-10-27 MED ORDER — FENTANYL CITRATE 0.05 MG/ML IJ SOLN
50.0000 ug | INTRAMUSCULAR | Status: DC | PRN
  Administered 2013-10-27: 100 ug via INTRAVENOUS

## 2013-10-27 MED ORDER — LIDOCAINE HCL (CARDIAC) 20 MG/ML IV SOLN
INTRAVENOUS | Status: DC | PRN
  Administered 2013-10-27: 40 mg via INTRAVENOUS

## 2013-10-27 MED ORDER — PROPOFOL 10 MG/ML IV BOLUS
INTRAVENOUS | Status: DC | PRN
  Administered 2013-10-27: 140 mg via INTRAVENOUS

## 2013-10-27 MED ORDER — METHOCARBAMOL 500 MG PO TABS: 500.0000 mg | ORAL_TABLET | Freq: Four times a day (QID) | ORAL | Status: DC | PRN

## 2013-10-27 MED ORDER — CEFAZOLIN SODIUM-DEXTROSE 2-3 GM-% IV SOLR
INTRAVENOUS | Status: AC
  Filled 2013-10-27: qty 50

## 2013-10-27 MED ORDER — FENTANYL CITRATE 0.05 MG/ML IJ SOLN
INTRAMUSCULAR | Status: AC
  Filled 2013-10-27: qty 6

## 2013-10-27 MED ORDER — ACETAMINOPHEN 500 MG PO TABS
ORAL_TABLET | ORAL | Status: AC
  Filled 2013-10-27: qty 2

## 2013-10-27 MED ORDER — DOCUSATE SODIUM 100 MG PO CAPS
100.0000 mg | ORAL_CAPSULE | Freq: Two times a day (BID) | ORAL | Status: DC
Start: 2013-10-27 — End: 2016-03-16

## 2013-10-27 MED ORDER — MIDAZOLAM HCL 2 MG/2ML IJ SOLN
1.0000 mg | INTRAMUSCULAR | Status: DC | PRN
  Administered 2013-10-27: 2 mg via INTRAVENOUS

## 2013-10-27 MED ORDER — CEFAZOLIN SODIUM-DEXTROSE 2-3 GM-% IV SOLR: 2.0000 g | INTRAVENOUS | Status: DC

## 2013-10-27 MED ORDER — ONDANSETRON HCL 4 MG/2ML IJ SOLN
INTRAMUSCULAR | Status: DC | PRN
  Administered 2013-10-27: 4 mg via INTRAVENOUS

## 2013-10-27 MED ORDER — OXYCODONE-ACETAMINOPHEN 5-325 MG PO TABS: 2.0000 | ORAL_TABLET | ORAL | Status: DC | PRN

## 2013-10-27 MED ORDER — ROPIVACAINE HCL 5 MG/ML IJ SOLN
INTRAMUSCULAR | Status: DC | PRN
  Administered 2013-10-27: 40 mL via PERINEURAL

## 2013-10-27 MED ORDER — ONDANSETRON HCL 4 MG PO TABS: 4.0000 mg | ORAL_TABLET | Freq: Three times a day (TID) | ORAL | Status: DC | PRN

## 2013-10-27 MED ORDER — CEFAZOLIN SODIUM-DEXTROSE 2-3 GM-% IV SOLR
INTRAVENOUS | Status: DC | PRN
  Administered 2013-10-27: 2 g via INTRAVENOUS

## 2013-10-27 SURGICAL SUPPLY — 68 items
ANCHOR SUT BIO SW 4.75 W/FIB (Anchor) ×3 IMPLANT
BANDAGE ELASTIC 4 VELCRO ST LF (GAUZE/BANDAGES/DRESSINGS) ×6 IMPLANT
BENZOIN TINCTURE PRP APPL 2/3 (GAUZE/BANDAGES/DRESSINGS) IMPLANT
BLADE SURG 15 STRL LF DISP TIS (BLADE) ×1 IMPLANT
BLADE SURG 15 STRL SS (BLADE) ×2
BNDG COHESIVE 4X5 TAN STRL (GAUZE/BANDAGES/DRESSINGS) ×3 IMPLANT
BNDG ESMARK 4X9 LF (GAUZE/BANDAGES/DRESSINGS) ×3 IMPLANT
CANISTER SUCT 1200ML W/VALVE (MISCELLANEOUS) ×3 IMPLANT
CHLORAPREP W/TINT 26ML (MISCELLANEOUS) ×3 IMPLANT
CLOSURE WOUND 1/2 X4 (GAUZE/BANDAGES/DRESSINGS)
COVER TABLE BACK 60X90 (DRAPES) ×3 IMPLANT
CUFF TOURNIQUET SINGLE 18IN (TOURNIQUET CUFF) ×3 IMPLANT
DECANTER SPIKE VIAL GLASS SM (MISCELLANEOUS) IMPLANT
DRAPE EXTREMITY T 121X128X90 (DRAPE) ×3 IMPLANT
DRAPE U 20/CS (DRAPES) ×3 IMPLANT
DRAPE U-SHAPE 47X51 STRL (DRAPES) ×3 IMPLANT
DRSG PAD ABDOMINAL 8X10 ST (GAUZE/BANDAGES/DRESSINGS) ×3 IMPLANT
ELECT REM PT RETURN 9FT ADLT (ELECTROSURGICAL) ×3
ELECTRODE REM PT RTRN 9FT ADLT (ELECTROSURGICAL) ×1 IMPLANT
GAUZE SPONGE 4X4 12PLY STRL (GAUZE/BANDAGES/DRESSINGS) ×3 IMPLANT
GAUZE XEROFORM 1X8 LF (GAUZE/BANDAGES/DRESSINGS) IMPLANT
GLOVE BIO SURGEON STRL SZ 6.5 (GLOVE) ×2 IMPLANT
GLOVE BIO SURGEON STRL SZ7.5 (GLOVE) ×3 IMPLANT
GLOVE BIO SURGEONS STRL SZ 6.5 (GLOVE) ×1
GLOVE BIOGEL PI IND STRL 7.0 (GLOVE) ×2 IMPLANT
GLOVE BIOGEL PI IND STRL 8 (GLOVE) ×1 IMPLANT
GLOVE BIOGEL PI INDICATOR 7.0 (GLOVE) ×4
GLOVE BIOGEL PI INDICATOR 8 (GLOVE) ×2
GLOVE ORTHO TXT STRL SZ7.5 (GLOVE) ×3 IMPLANT
GOWN STRL REUS W/ TWL LRG LVL3 (GOWN DISPOSABLE) ×3 IMPLANT
GOWN STRL REUS W/ TWL XL LVL3 (GOWN DISPOSABLE) ×1 IMPLANT
GOWN STRL REUS W/TWL LRG LVL3 (GOWN DISPOSABLE) ×6
GOWN STRL REUS W/TWL XL LVL3 (GOWN DISPOSABLE) ×5 IMPLANT
KIT SUTURETAK 3 SPEAR TROCAR (KITS) ×3 IMPLANT
NEEDLE 1/2 CIR CATGUT .05X1.09 (NEEDLE) ×3 IMPLANT
NEEDLE HYPO 25X1 1.5 SAFETY (NEEDLE) IMPLANT
NS IRRIG 1000ML POUR BTL (IV SOLUTION) ×3 IMPLANT
PACK BASIN DAY SURGERY FS (CUSTOM PROCEDURE TRAY) ×3 IMPLANT
PAD CAST 4YDX4 CTTN HI CHSV (CAST SUPPLIES) ×3 IMPLANT
PADDING CAST ABS 4INX4YD NS (CAST SUPPLIES) ×2
PADDING CAST ABS COTTON 4X4 ST (CAST SUPPLIES) ×1 IMPLANT
PADDING CAST COTTON 4X4 STRL (CAST SUPPLIES) ×6
PENCIL BUTTON HOLSTER BLD 10FT (ELECTRODE) ×3 IMPLANT
SLEEVE SCD COMPRESS KNEE MED (MISCELLANEOUS) ×3 IMPLANT
SLING ARM LRG ADULT FOAM STRAP (SOFTGOODS) IMPLANT
SLING ARM MED ADULT FOAM STRAP (SOFTGOODS) IMPLANT
SPLINT FAST PLASTER 5X30 (CAST SUPPLIES) ×20
SPLINT PLASTER CAST FAST 5X30 (CAST SUPPLIES) ×10 IMPLANT
STOCKINETTE 4X48 STRL (DRAPES) ×3 IMPLANT
STRIP CLOSURE SKIN 1/2X4 (GAUZE/BANDAGES/DRESSINGS) IMPLANT
SUCTION FRAZIER TIP 10 FR DISP (SUCTIONS) ×3 IMPLANT
SUT 2 FIBERLOOP 20 STRT BLUE (SUTURE)
SUT FIBERWIRE #2 38 T-5 BLUE (SUTURE) ×3
SUT VIC AB 0 CT1 27 (SUTURE) ×2
SUT VIC AB 0 CT1 27XBRD ANBCTR (SUTURE) ×1 IMPLANT
SUT VIC AB 2-0 SH 27 (SUTURE)
SUT VIC AB 2-0 SH 27XBRD (SUTURE) IMPLANT
SUT VICRYL 3-0 CR8 SH (SUTURE) ×3 IMPLANT
SUTURE 2 FIBERLOOP 20 STRT BLU (SUTURE) IMPLANT
SUTURE FIBERWR #2 38 T-5 BLUE (SUTURE) ×1 IMPLANT
SYR BULB 3OZ (MISCELLANEOUS) ×3 IMPLANT
TOPAZ MICROBLATION (SURGICAL WAND) IMPLANT
TOWEL OR 17X24 6PK STRL BLUE (TOWEL DISPOSABLE) ×3 IMPLANT
TOWEL OR NON WOVEN STRL DISP B (DISPOSABLE) ×6 IMPLANT
TUBE CONNECTING 20'X1/4 (TUBING) ×1
TUBE CONNECTING 20X1/4 (TUBING) ×2 IMPLANT
UNDERPAD 30X30 INCONTINENT (UNDERPADS AND DIAPERS) ×3 IMPLANT
WAND TOPAZ EPF (SURGICAL WAND) IMPLANT

## 2013-10-27 NOTE — Discharge Instructions (Signed)
Keep splint clean and dry until follow up  Resume your aspirin  Regional Anesthesia Blocks  1. Numbness or the inability to move the "blocked" extremity may last from 3-48 hours after placement. The length of time depends on the medication injected and your individual response to the medication. If the numbness is not going away after 48 hours, call your surgeon.  2. The extremity that is blocked will need to be protected until the numbness is gone and the  Strength has returned. Because you cannot feel it, you will need to take extra care to avoid injury. Because it may be weak, you may have difficulty moving it or using it. You may not know what position it is in without looking at it while the block is in effect.  3. For blocks in the legs and feet, returning to weight bearing and walking needs to be done carefully. You will need to wait until the numbness is entirely gone and the strength has returned. You should be able to move your leg and foot normally before you try and bear weight or walk. You will need someone to be with you when you first try to ensure you do not fall and possibly risk injury.  4. Bruising and tenderness at the needle site are common side effects and will resolve in a few days.  5. Persistent numbness or new problems with movement should be communicated to the surgeon or the [place] [phone] [place] [phone]).   Post Anesthesia Home Care Instructions  Activity: Get plenty of rest for the remainder of the day. A responsible adult should stay with you for 24 hours following the procedure.  For the next 24 hours, DO NOT: -Drive a car -Paediatric nurse -Drink alcoholic beverages -Take any medication unless instructed by your physician -Make any legal decisions or sign important papers.  Meals: Start with liquid foods such as gelatin or soup. Progress to regular foods as tolerated. Avoid greasy, spicy, heavy foods. If  nausea and/or vomiting occur, drink only clear liquids until the nausea and/or vomiting subsides. Call your physician if vomiting continues.  Special Instructions/Symptoms: Your throat may feel dry or sore from the anesthesia or the breathing tube placed in your throat during surgery. If this causes discomfort, gargle with warm salt water. The discomfort should disappear within 24 hours.

## 2013-10-27 NOTE — Interval H&P Note (Signed)
History and Physical Interval Note:  10/27/2013 12:33 AM  [name]  has presented today for surgery, with the diagnosis of Right Sprain/Strain/Tear Upper Arm Unspecified  The various methods of treatment have been discussed with the patient and family. After consideration of risks, benefits and other options for treatment, the patient has consented to  Procedure(s): RIGHT REINSERTION FUPTURED BICEPS/TRICEPS TENDON DISTAL WITHOUT TENDON GRAFT (Right) as a surgical intervention .  The patient's history has been reviewed, patient examined, no change in status, stable for surgery.  I have reviewed the patient's chart and labs.  Questions were answered to the patient's satisfaction.     Luann Aspinwall, D

## 2013-10-27 NOTE — Anesthesia Procedure Notes (Addendum)
Anesthesia Regional Block:  Supraclavicular block  Pre-Anesthetic Checklist: ,, timeout performed, Correct Patient, Correct Site, Correct Laterality, Correct Procedure, Correct Position, site marked, Risks and benefits discussed, pre-op evaluation, post-op pain management  Laterality: Right  Prep: Maximum Sterile Barrier Precautions used and chloraprep       Needles:  Injection technique: Single-shot  Needle Type: Echogenic Stimulator Needle     Needle Length: 5cm 5 cm Needle Gauge: 22 and 22 G    Additional Needles:  Procedures: ultrasound guided (picture in chart) Supraclavicular block Narrative:  Start time: 10/27/2013 8:41 AM End time: 10/27/2013 8:51 AM Injection made incrementally with aspirations every 5 mL. Anesthesiologist: Fitzgerald,MD  Additional Notes: 2% Lidocaine skin wheel. Intercostobrachial block with 10cc of 0.5% Ropivicaine plain.   Procedure Name: LMA Insertion Performed by: Tawni Millers Pre-anesthesia Checklist: Patient identified, Emergency Drugs available, Suction available and Patient being monitored Patient Re-evaluated:Patient Re-evaluated prior to inductionOxygen Delivery Method: Circle System Utilized Preoxygenation: Pre-oxygenation with 100% oxygen Intubation Type: IV induction Ventilation: Mask ventilation without difficulty LMA: LMA inserted LMA Size: 5.0 Number of attempts: 1 Airway Equipment and Method: bite block Placement Confirmation: positive ETCO2 Tube secured with: Tape Dental Injury: Teeth and Oropharynx as per pre-operative assessment

## 2013-10-27 NOTE — Anesthesia Preprocedure Evaluation (Addendum)
Anesthesia Evaluation  Patient identified by MRN, date of birth, ID band Patient awake    Reviewed: Allergy & Precautions, H&P , NPO status , Patient's Chart, lab work & pertinent test results, reviewed documented beta blocker date and time   Airway Mallampati: II TM Distance: >3 FB Neck ROM: Full    Dental no notable dental hx. (+) Teeth Intact, Dental Advisory Given   Pulmonary neg pulmonary ROS,  breath sounds clear to auscultation  Pulmonary exam normal       Cardiovascular hypertension, On Medications and On Home Beta Blockers + angina with exertion + CAD Rhythm:Regular Rate:Normal     Neuro/Psych negative neurological ROS  negative psych ROS   GI/Hepatic Neg liver ROS, GERD-  Medicated and Controlled,  Endo/Other  negative endocrine ROS  Renal/GU negative Renal ROS  negative genitourinary   Musculoskeletal   Abdominal   Peds  Hematology negative hematology ROS (+)   Anesthesia Other Findings   Reproductive/Obstetrics negative OB ROS                          Anesthesia Physical Anesthesia Plan  ASA: III  Anesthesia Plan: General and Regional   Post-op Pain Management:    Induction: Intravenous  Airway Management Planned: LMA  Additional Equipment:   Intra-op Plan:   Post-operative Plan: Extubation in OR  Informed Consent: I have reviewed the patients History and Physical, chart, labs and discussed the procedure including the risks, benefits and alternatives for the proposed anesthesia with the patient or authorized representative who has indicated his/her understanding and acceptance.   Dental advisory given  Plan Discussed with: CRNA  Anesthesia Plan Comments:         Anesthesia Quick Evaluation

## 2013-10-27 NOTE — Progress Notes (Signed)
Assisted Dr. Ola Spurr with right, ultrasound guided, interscalene  block. Side rails up, monitors on throughout procedure. See vital signs in flow sheet. Tolerated Procedure well.

## 2013-10-27 NOTE — Transfer of Care (Signed)
Immediate Anesthesia Transfer of Care Note  Patient: [name]  Procedure(s) Performed: Procedure(s): RIGHT OPEN TRICEPS REPAIR (Right)  Patient Location: PACU  Anesthesia Type:GA combined with regional for post-op pain  Level of Consciousness: sedated and patient cooperative  Airway & Oxygen Therapy: Patient Spontanous Breathing and Patient connected to nasal cannula oxygen  Post-op Assessment: Report given to PACU RN and Post -op Vital signs reviewed and stable  Post vital signs: Reviewed and stable  Complications: No apparent anesthesia complications

## 2013-10-27 NOTE — Op Note (Signed)
10/27/2013  10:07 AM  PATIENT:  [name]    PRE-OPERATIVE DIAGNOSIS:  Right Sprain/Strain/Tear Upper Arm Unspecified  POST-OPERATIVE DIAGNOSIS:  Same  PROCEDURE:  RIGHT REINSERTION FUPTURED BICEPS/TRICEPS TENDON DISTAL WITHOUT TENDON GRAFT  SURGEON:  Edmonia Lynch, D, MD  ASSISTANT: Joya Gaskins, OPA  ANESTHESIA:   Gen  PREOPERATIVE INDICATIONS:  [name] is a  68 y.o. male with a diagnosis of Right Sprain/Strain/Tear Upper Arm Unspecified who failed conservative measures and elected for surgical management.    The risks benefits and alternatives were discussed with the patient preoperatively including but not limited to the risks of infection, bleeding, nerve injury, cardiopulmonary complications, the need for revision surgery, among others, and the patient was willing to proceed.  OPERATIVE IMPLANTS: 4.5 corkscrew  OPERATIVE FINDINGS: triceps partial rupture  BLOOD LOSS: min  COMPLICATIONS: none  TOURNIQUET TIME: 50min  OPERATIVE PROCEDURE:  Patient was identified in the preoperative holding area and site was marked by me He was transported to the operating theater and placed on the table in supine position taking care to pad all bony prominences. After a preincinduction time out anesthesia was induced. The left upper extremity was prepped and draped in normal sterile fashion and a pre-incision timeout was performed. He received ancef for preoperative antibiotics.   I inflated the tourniquet to 250 mm mercury after exsanguinating the limb. I then made a roughly 6 cm incision from the tip of his olecranon status triceps. I dissected down sharply to the fascia over top of the triceps tendon there was there were superficial fibers intact I did split this and identifiable within the main substance of the tendon was a ruptured retracted portion of tendon the remaining tendon was actually more extensive than I thought it would be based on the MRI but was somewhat  wispy.  I then placed a tap and punch for a 45 corkscrew I took a fluoroscopic shots to confirm that it was not within the joint and was happy with that. I loaded the corkscrew with a fiber tape as well as the FiberWire stitch the reason I added the FiberWire was to have a stitch to close down the split in the remaining tendon.  I then used the fiber tape to whipstitch up and back into the ruptured portion of the tendon and this delivered nicely down to the anchor. I tied this into place and was happy with no gapping at 90 of flexion. I then cut this and use the FiberWire to close the defect in the tendon that had remained. I buried and not on this and kept as well. As very happy with the closure the tendon the rehabilitation position and the contour of it there is no longer a palpable defect. There is no tension at 90. I then thoroughly irrigated the wound closed with a Vicryl and a buried Monocryl stitch. Sterile dressing was applied and placed in a splint extended to just past 90.  His taken the PACU in stable condition.  POST OPERATIVE PLAN: He'll resume his normal aspirin he'll ambulate for DVT prophylaxis to keep the splint clean dry and intact.

## 2013-10-27 NOTE — Interval H&P Note (Signed)
History and Physical Interval Note:  10/27/2013 12:34 AM  [name]  has presented today for surgery, with the diagnosis of Right Sprain/Strain/Tear Upper Arm Unspecified  The various methods of treatment have been discussed with the patient and family. After consideration of risks, benefits and other options for treatment, the patient has consented to  Procedure(s): RIGHT REINSERTION FUPTURED BICEPS/TRICEPS TENDON DISTAL WITHOUT TENDON GRAFT (Right) as a surgical intervention .  The patient's history has been reviewed, patient examined, no change in status, stable for surgery.  I have reviewed the patient's chart and labs.  Questions were answered to the patient's satisfaction.     Gayle Collard, D

## 2013-10-27 NOTE — Anesthesia Postprocedure Evaluation (Signed)
  Anesthesia Post-op Note  Patient: [name]  Procedure(s) Performed: Procedure(s): RIGHT OPEN TRICEPS REPAIR (Right)  Patient Location: PACU  Anesthesia Type:General and block  Level of Consciousness: awake and alert   Airway and Oxygen Therapy: Patient Spontanous Breathing  Post-op Pain: none  Post-op Assessment: Post-op Vital signs reviewed, Patient's Cardiovascular Status Stable and Respiratory Function Stable  Post-op Vital Signs: Reviewed  Filed Vitals:   10/27/13 1030  BP: 163/75  Pulse: 50  Temp:   Resp: 17    Complications: No apparent anesthesia complications

## 2013-10-31 ENCOUNTER — Emergency Department (HOSPITAL_COMMUNITY): Payer: Worker's Compensation

## 2013-10-31 ENCOUNTER — Encounter (HOSPITAL_COMMUNITY): Payer: Self-pay | Admitting: Emergency Medicine

## 2013-10-31 ENCOUNTER — Emergency Department (HOSPITAL_COMMUNITY)
Admission: EM | Admit: 2013-10-31 | Discharge: 2013-10-31 | Disposition: A | Discharge status: Home / Self Care | Payer: Worker's Compensation | Attending: Emergency Medicine | Admitting: Emergency Medicine

## 2013-10-31 DIAGNOSIS — Z79899 Other long term (current) drug therapy: Secondary | ICD-10-CM | POA: Diagnosis not present

## 2013-10-31 DIAGNOSIS — K5909 Other constipation: Secondary | ICD-10-CM | POA: Diagnosis not present

## 2013-10-31 DIAGNOSIS — Z7982 Long term (current) use of aspirin: Secondary | ICD-10-CM | POA: Insufficient documentation

## 2013-10-31 DIAGNOSIS — K219 Gastro-esophageal reflux disease without esophagitis: Secondary | ICD-10-CM | POA: Insufficient documentation

## 2013-10-31 DIAGNOSIS — I10 Essential (primary) hypertension: Secondary | ICD-10-CM | POA: Diagnosis not present

## 2013-10-31 DIAGNOSIS — R1084 Generalized abdominal pain: Secondary | ICD-10-CM | POA: Diagnosis not present

## 2013-10-31 DIAGNOSIS — E785 Hyperlipidemia, unspecified: Secondary | ICD-10-CM | POA: Diagnosis not present

## 2013-10-31 DIAGNOSIS — R112 Nausea with vomiting, unspecified: Secondary | ICD-10-CM | POA: Diagnosis not present

## 2013-10-31 DIAGNOSIS — M129 Arthropathy, unspecified: Secondary | ICD-10-CM | POA: Insufficient documentation

## 2013-10-31 DIAGNOSIS — Z880 Allergy status to penicillin: Secondary | ICD-10-CM | POA: Diagnosis not present

## 2013-10-31 DIAGNOSIS — R079 Chest pain, unspecified: Secondary | ICD-10-CM

## 2013-10-31 DIAGNOSIS — I2510 Atherosclerotic heart disease of native coronary artery without angina pectoris: Secondary | ICD-10-CM | POA: Diagnosis not present

## 2013-10-31 DIAGNOSIS — Z8611 Personal history of tuberculosis: Secondary | ICD-10-CM | POA: Insufficient documentation

## 2013-10-31 DIAGNOSIS — Z951 Presence of aortocoronary bypass graft: Secondary | ICD-10-CM | POA: Diagnosis not present

## 2013-10-31 LAB — BASIC METABOLIC PANEL
Anion gap: 12 (ref 5–15)
BUN: 17 mg/dL (ref 6–23)
CALCIUM: 9.5 mg/dL (ref 8.4–10.5)
CO2: 25 meq/L (ref 19–32)
CREATININE: 0.97 mg/dL (ref 0.50–1.35)
Chloride: 95 mEq/L — ABNORMAL LOW (ref 96–112)
GFR calc Af Amer: >90 mL/min (ref >90)
GFR calc non Af Amer: 82 mL/min — ABNORMAL LOW (ref >90)
Glucose, Bld: 111 mg/dL — ABNORMAL HIGH (ref 70–99)
Potassium: 4.9 mEq/L (ref 3.7–5.3)
SODIUM: 132 meq/L — AB (ref 137–147)

## 2013-10-31 LAB — CBC WITH DIFFERENTIAL/PLATELET
Basophils Absolute: 0 10*3/uL (ref 0.0–0.1)
Basophils Relative: 0 % (ref 0–1)
EOS PCT: 3 % (ref 0–5)
Eosinophils Absolute: 0.2 10*3/uL (ref 0.0–0.7)
HEMATOCRIT: 39.8 % (ref 39.0–52.0)
Hemoglobin: 14.4 g/dL (ref 13.0–17.0)
LYMPHS ABS: 2.5 10*3/uL (ref 0.7–4.0)
LYMPHS PCT: 31 % (ref 12–46)
MCH: 32.8 pg (ref 26.0–34.0)
MCHC: 36.2 g/dL — ABNORMAL HIGH (ref 30.0–36.0)
MCV: 90.7 fL (ref 78.0–100.0)
MONO ABS: 0.8 10*3/uL (ref 0.1–1.0)
Monocytes Relative: 9 % (ref 3–12)
Neutro Abs: 4.7 10*3/uL (ref 1.7–7.7)
Neutrophils Relative %: 57 % (ref 43–77)
Platelets: 246 10*3/uL (ref 150–400)
RBC: 4.39 MIL/uL (ref 4.22–5.81)
RDW: 12.1 % (ref 11.5–15.5)
WBC: 8.1 10*3/uL (ref 4.0–10.5)

## 2013-10-31 LAB — TROPONIN I

## 2013-10-31 LAB — HEPATIC FUNCTION PANEL
ALK PHOS: 52 U/L (ref 39–117)
ALT: 17 U/L (ref 0–53)
AST: 47 U/L — ABNORMAL HIGH (ref 0–37)
Albumin: 3.4 g/dL — ABNORMAL LOW (ref 3.5–5.2)
Bilirubin, Direct: <0.2 mg/dL (ref 0.0–0.3)
TOTAL PROTEIN: 7.4 g/dL (ref 6.0–8.3)
Total Bilirubin: 1 mg/dL (ref 0.3–1.2)

## 2013-10-31 LAB — D-DIMER, QUANTITATIVE: D-Dimer, Quant: 1.3 ug/mL-FEU — ABNORMAL HIGH (ref 0.00–0.48)

## 2013-10-31 LAB — LIPASE, BLOOD: Lipase: 18 U/L (ref 11–59)

## 2013-10-31 MED ORDER — TECHNETIUM TC 99M DIETHYLENETRIAME-PENTAACETIC ACID
40.0000 | Freq: Once | INTRAVENOUS | Status: DC | PRN
Start: 2013-10-31 — End: 2013-10-31

## 2013-10-31 MED ORDER — TECHNETIUM TO 99M ALBUMIN AGGREGATED
6.0000 | Freq: Once | INTRAVENOUS | Status: AC | PRN
  Administered 2013-10-31: 6 via INTRAVENOUS

## 2013-10-31 MED ORDER — IOHEXOL 300 MG/ML  SOLN
25.0000 mL | Freq: Once | INTRAMUSCULAR | Status: AC | PRN
  Administered 2013-10-31: 25 mL via ORAL

## 2013-10-31 MED ORDER — ONDANSETRON HCL 4 MG/2ML IJ SOLN
4.0000 mg | Freq: Once | INTRAMUSCULAR | Status: AC
  Administered 2013-10-31: 4 mg via INTRAVENOUS
  Filled 2013-10-31: qty 2

## 2013-10-31 MED ORDER — MORPHINE SULFATE 4 MG/ML IJ SOLN
6.0000 mg | Freq: Once | INTRAMUSCULAR | Status: AC
  Administered 2013-10-31: 6 mg via INTRAVENOUS
  Filled 2013-10-31: qty 2

## 2013-10-31 MED ORDER — GI COCKTAIL ~~LOC~~
30.0000 mL | Freq: Once | ORAL | Status: AC
  Administered 2013-10-31: 30 mL via ORAL
  Filled 2013-10-31: qty 30

## 2013-10-31 MED ORDER — IOHEXOL 350 MG/ML SOLN
100.0000 mL | Freq: Once | INTRAVENOUS | Status: AC | PRN
  Administered 2013-10-31: 100 mL via INTRAVENOUS

## 2013-10-31 MED ORDER — SODIUM CHLORIDE 0.9 % IV BOLUS (SEPSIS)
1000.0000 mL | Freq: Once | INTRAVENOUS | Status: AC
  Administered 2013-10-31: 1000 mL via INTRAVENOUS

## 2013-10-31 MED ORDER — POLYETHYLENE GLYCOL 3350 17 G PO PACK: 17.0000 g | PACK | Freq: Every day | ORAL | Status: DC | PRN

## 2013-10-31 MED ORDER — MORPHINE SULFATE 4 MG/ML IJ SOLN: 6.0000 mg | Freq: Once | INTRAMUSCULAR | Status: DC

## 2013-10-31 NOTE — ED Provider Notes (Signed)
Case signed out to me by Doctor Venora Maples in followup on CT scan. Patient was seen for chest pain and shortness of breath. His initial workup included a d-dimer which was elevated. Because the patient had recent surgery and immobilization of the arm, PE was considered. CT angiography of chest was performed to further evaluate. It was an inadequate study, no acute pathology seen. Patient had resolution of the chest pain and was complaining of abdominal pain, therefore CT abdomen and pelvis was performed. This was an unremarkable study other than constipation. Patient had a repeat troponin which was negative. Upon my recheck of the patient he had not had any chest pain for quite some time here in the ER, was feeling much improved. A VQ scan was ordered because of the inadequate CT angiography, this was negative for PE. Patient will be discharged home, follow up with his primary doctor in the office this week.  Orpah Greek, MD 10/31/13 1318

## 2013-10-31 NOTE — ED Notes (Addendum)
Pt arrives via EMS with c/o chest pain with ST depression per EMS EKG. Pain left side of chest and abdomen. Extensive cardiac hx, GERD. Constipation since Friday. 3 SL nitro and 325 MG aspirin PTA. No IV. 130/72 last BP. 2L placed by EMS for [facility], 97% RA

## 2013-10-31 NOTE — Discharge Instructions (Signed)
Chest Pain (Nonspecific) °It is often hard to give a specific diagnosis for the cause of chest pain. There is always a chance that your pain could be related to something serious, such as a heart attack or a blood clot in the lungs. You need to follow up with your health care provider for further evaluation. °CAUSES  °· Heartburn. °· Pneumonia or bronchitis. °· Anxiety or stress. °· Inflammation around your heart (pericarditis) or lung (pleuritis or pleurisy). °· A blood clot in the lung. °· A collapsed lung (pneumothorax). It can develop suddenly on its own (spontaneous pneumothorax) or from trauma to the chest. °· Shingles infection (herpes zoster virus). °The chest wall is composed of bones, muscles, and cartilage. Any of these can be the source of the pain. °· The bones can be bruised by injury. °· The muscles or cartilage can be strained by coughing or overwork. °· The cartilage can be affected by inflammation and become sore (costochondritis). °DIAGNOSIS  °Lab tests or other studies may be needed to find the cause of your pain. Your health care provider may have you take a test called an ambulatory electrocardiogram (ECG). An ECG records your heartbeat patterns over a 24-hour period. You may also have other tests, such as: °· Transthoracic echocardiogram (TTE). During echocardiography, sound waves are used to evaluate how blood flows through your heart. °· Transesophageal echocardiogram (TEE). °· Cardiac monitoring. This allows your health care provider to monitor your heart rate and rhythm in real time. °· Holter monitor. This is a portable device that records your heartbeat and can help diagnose heart arrhythmias. It allows your health care provider to track your heart activity for several days, if needed. °· Stress tests by exercise or by giving medicine that makes the heart beat faster. °TREATMENT  °· Treatment depends on what may be causing your chest pain. Treatment may include: °¨ Acid blockers for  heartburn. °¨ Anti-inflammatory medicine. °¨ Pain medicine for inflammatory conditions. °¨ Antibiotics if an infection is present. °· You may be advised to change lifestyle habits. This includes stopping smoking and avoiding alcohol, caffeine, and chocolate. °· You may be advised to keep your head raised (elevated) when sleeping. This reduces the chance of acid going backward from your stomach into your esophagus. °Most of the time, nonspecific chest pain will improve within 2-3 days with rest and mild pain medicine.  °HOME CARE INSTRUCTIONS  °· If antibiotics were prescribed, take them as directed. Finish them even if you start to feel better. °· For the next few days, avoid physical activities that bring on chest pain. Continue physical activities as directed. °· Do not use any tobacco products, including cigarettes, chewing tobacco, or electronic cigarettes. °· Avoid drinking alcohol. °· Only take medicine as directed by your health care provider. °· Follow your health care provider's suggestions for further testing if your chest pain does not go away. °· Keep any follow-up appointments you made. If you do not go to an appointment, you could develop lasting (chronic) problems with pain. If there is any problem keeping an appointment, call to reschedule. °SEEK MEDICAL CARE IF:  °· Your chest pain does not go away, even after treatment. °· You have a rash with blisters on your chest. °· You have a fever. °SEEK IMMEDIATE MEDICAL CARE IF:  °· You have increased chest pain or pain that spreads to your arm, neck, jaw, back, or abdomen. °· You have shortness of breath. °· You have an increasing cough, or you cough   up blood.  You have severe back or abdominal pain.  You feel nauseous or vomit.  You have severe weakness.  You faint.  You have chills. This is an emergency. Do not wait to see if the pain will go away. Get medical help at once. Call your local emergency services (911 in [place] Do not drive  yourself to the hospital. MAKE SURE YOU:   Understand these instructions.  Will watch your condition.  Will get help right away if you are not doing well or get worse. Document Released: 12/04/2004 Document Revised: 03/01/2013 Document Reviewed: 09/30/2007 [facility] Patient Information 2015 [place], [place]. This information is not intended to replace advice given to you by your health care provider. Make sure you discuss any questions you have with your health care provider.  Abdominal Pain Many things can cause abdominal pain. Usually, abdominal pain is not caused by a disease and will improve without treatment. It can often be observed and treated at home. Your health care provider will do a physical exam and possibly order blood tests and X-rays to help determine the seriousness of your pain. However, in many cases, more time must pass before a clear cause of the pain can be found. Before that point, your health care provider may not know if you need more testing or further treatment. HOME CARE INSTRUCTIONS  Monitor your abdominal pain for any changes. The following actions may help to alleviate any discomfort you are experiencing:  Only take over-the-counter or prescription medicines as directed by your health care provider.  Do not take laxatives unless directed to do so by your health care provider.  Try a clear liquid diet (broth, tea, or water) as directed by your health care provider. Slowly move to a bland diet as tolerated. SEEK MEDICAL CARE IF:  You have unexplained abdominal pain.  You have abdominal pain associated with nausea or diarrhea.  You have pain when you urinate or have a bowel movement.  You experience abdominal pain that wakes you in the night.  You have abdominal pain that is worsened or improved by eating food.  You have abdominal pain that is worsened with eating fatty foods.  You have a fever. SEEK IMMEDIATE MEDICAL CARE IF:   Your pain does not go  away within 2 hours.  You keep throwing up (vomiting).  Your pain is felt only in portions of the abdomen, such as the right side or the left lower portion of the abdomen.  You pass bloody or black tarry stools. MAKE SURE YOU:  Understand these instructions.   Will watch your condition.   Will get help right away if you are not doing well or get worse.  Document Released: 12/04/2004 Document Revised: 03/01/2013 Document Reviewed: 11/03/2012 [facility] Patient Information 2015 [place], [place]. This information is not intended to replace advice given to you by your health care provider. Make sure you discuss any questions you have with your health care provider.  Constipation Constipation is when a person has fewer than three bowel movements a week, has difficulty having a bowel movement, or has stools that are dry, hard, or larger than normal. As people grow older, constipation is more common. If you try to fix constipation with medicines that make you have a bowel movement (laxatives), the problem may get worse. Long-term laxative use may cause the muscles of the colon to become weak. A low-fiber diet, not taking in enough fluids, and taking certain medicines may make constipation worse.  CAUSES  Certain medicines, such as antidepressants, pain medicine, iron supplements, antacids, and water pills.   Certain diseases, such as diabetes, irritable bowel syndrome (IBS), thyroid disease, or depression.   Not drinking enough water.   Not eating enough fiber-rich foods.   Stress or travel.   Lack of physical activity or exercise.   Ignoring the urge to have a bowel movement.   Using laxatives too much.  SIGNS AND SYMPTOMS   Having fewer than three bowel movements a week.   Straining to have a bowel movement.   Having stools that are hard, dry, or larger than normal.   Feeling full or bloated.   Pain in the lower abdomen.   Not feeling relief after having a  bowel movement.  DIAGNOSIS  Your health care provider will take a medical history and perform a physical exam. Further testing may be done for severe constipation. Some tests may include:  A barium enema X-ray to examine your rectum, colon, and, sometimes, your small intestine.   A sigmoidoscopy to examine your lower colon.   A colonoscopy to examine your entire colon. TREATMENT  Treatment will depend on the severity of your constipation and what is causing it. Some dietary treatments include drinking more fluids and eating more fiber-rich foods. Lifestyle treatments may include regular exercise. If these diet and lifestyle recommendations do not help, your health care provider may recommend taking over-the-counter laxative medicines to help you have bowel movements. Prescription medicines may be prescribed if over-the-counter medicines do not work.  HOME CARE INSTRUCTIONS   Eat foods that have a lot of fiber, such as fruits, vegetables, whole grains, and beans.  Limit foods high in fat and processed sugars, such as french fries, hamburgers, cookies, candies, and soda.   A fiber supplement may be added to your diet if you cannot get enough fiber from foods.   Drink enough fluids to keep your urine clear or pale yellow.   Exercise regularly or as directed by your health care provider.   Go to the restroom when you have the urge to go. Do not hold it.   Only take over-the-counter or prescription medicines as directed by your health care provider. Do not take other medicines for constipation without talking to your health care provider first.  [place] IF:   You have bright red blood in your stool.   Your constipation lasts for more than 4 days or gets worse.   You have abdominal or rectal pain.   You have thin, pencil-like stools.   You have unexplained weight loss. MAKE SURE YOU:   Understand these instructions.  Will watch your  condition.  Will get help right away if you are not doing well or get worse. Document Released: 11/23/2003 Document Revised: 03/01/2013 Document Reviewed: 12/06/2012 [facility] Patient Information 2015 [place], [place]. This information is not intended to replace advice given to you by your health care provider. Make sure you discuss any questions you have with your health care provider.

## 2013-10-31 NOTE — ED Provider Notes (Addendum)
CSN: [id number]     Arrival date & time 10/31/13  0236 History   First MD Initiated Contact with Patient 10/31/13 0239     Chief Complaint  Patient presents with  . Chest Pain      HPI Patient presents emergency department complaining of some chest pain which are broken this evening.  He does report some shortness of breath as well.  His chest pain however was pleuritic.  He feels that he can't get a good deep breath.  He recently had right arm surgery and has his right arm casted.  This occurred approximately 3 days ago.  No history of DVT or pulmonary embolism.  He reports that is also had developing upper and left-sided abdominal pain over the past 24 hours.  His pain seems to be worsening at this time.  He had nausea and vomiting in route.  He was concerned about his chest pain as he does have a history of coronary artery disease status post CABG.  Aspirin nitroglycerin were given.  His EKG on arrival demonstrates normal sinus rhythm with no ST changes.  He still having discomfort at this time.  He does report some constipation since his surgery.  He reports his never had abdominal pain like this before.  Denies diarrhea.  No fevers or chills.   Past Medical History  Diagnosis Date  . Hypertension   . Hyperlipidemia   . CAD (coronary artery disease)   . Blood transfusion without reported diagnosis 2006    during open heart surgery  . GERD (gastroesophageal reflux disease)   . Tuberculosis 1956  . Arthritis    Past Surgical History  Procedure Laterality Date  . Heart bypass  2006    4 vessels  . Angioplasty  2010    3 stents placed  . Lumbar disc surgery  1998  . Knee arthroscopy  2001  . Cataract extraction w/ intraocular lens  implant, bilateral  2012    bilateral  . Colonoscopy     Family History  Problem Relation Age of Onset  . Colon cancer Mother 72  . Colon cancer Father 74  . Stomach cancer Neg Hx    History  Substance Use Topics  . Smoking status: Never Smoker    . Smokeless tobacco: Never Used  . Alcohol Use: No    Review of Systems  All other systems reviewed and are negative.     Allergies  Codeine and Penicillins  Home Medications   Prior to Admission medications   Medication Sig Start Date End Date Taking? Authorizing Provider  acetaminophen (TYLENOL 8 HOUR) 650 MG CR tablet Take 650 mg by mouth every 8 (eight) hours as needed.    Historical Provider, MD  aspirin 325 MG tablet Take 325 mg by mouth daily.    Historical Provider, MD  Calcium Carbonate Antacid (TUMS ULTRA PO) Take by mouth as needed.    Historical Provider, MD  co-enzyme Q-10 30 MG capsule Take 200 mg by mouth 2 (two) times daily.    Historical Provider, MD  CRESTOR 20 MG tablet Take 20 mg by mouth daily. Takes 1/2 tablet daily 12/04/11   Historical Provider, MD  docusate sodium (COLACE) 100 MG capsule Take 100 mg by mouth 2 (two) times daily.    Historical Provider, MD  docusate sodium (COLACE) 100 MG capsule Take 1 capsule (100 mg total) by mouth 2 (two) times daily. 10/27/13   Renette Butters, MD  fish oil-omega-3 fatty acids 1000 MG capsule  Take 1 g by mouth 2 (two) times daily.    Historical Provider, MD  lansoprazole (PREVACID) 15 MG capsule Take 15 mg by mouth 2 (two) times daily.    Historical Provider, MD  losartan (COZAAR) 100 MG tablet Take 100 mg by mouth daily. Takes 1/2 tablet daily    Historical Provider, MD  methocarbamol (ROBAXIN) 500 MG tablet Take 1 tablet (500 mg total) by mouth every 6 (six) hours as needed. 10/27/13   Renette Butters, MD  metoprolol tartrate (LOPRESSOR) 25 MG tablet Take 25 mg by mouth 2 (two) times daily. Takes 1/2 tablet daily 12/19/11   Historical Provider, MD  Multiple Vitamin (MULTIVITAMIN) tablet Take 1 tablet by mouth daily.    Historical Provider, MD  NITROSTAT 0.4 MG SL tablet Place 0.4 mg under the tongue every 5 (five) minutes as needed.  10/28/11   Historical Provider, MD  ondansetron (ZOFRAN) 4 MG tablet Take 1 tablet (4 mg  total) by mouth every 8 (eight) hours as needed for nausea or vomiting. 10/27/13   Renette Butters, MD  oxyCODONE-acetaminophen (ROXICET) 5-325 MG per tablet Take 2 tablets by mouth every 4 (four) hours as needed. 10/27/13   Renette Butters, MD  Polyethyl Glycol-Propyl Glycol (SYSTANE OP) Apply to eye as needed.    Historical Provider, MD  Polyethyl Glycol-Propyl Glycol (SYSTANE) 0.4-0.3 % GEL Apply to eye as needed.    Historical Provider, MD  RANEXA 1000 MG SR tablet Take 1,000 mg by mouth 2 (two) times daily.  01/28/12   Historical Provider, MD  tadalafil (CIALIS) 20 MG tablet Take 20 mg by mouth daily as needed.    Historical Provider, MD  vitamin C (ASCORBIC ACID) 500 MG tablet Take 500 mg by mouth daily.    Historical Provider, MD  ZETIA 10 MG tablet Take 10 mg by mouth daily. Takes 1/2 tablet daily 12/19/11   Historical Provider, MD   BP 152/74  Pulse 69  Temp(Src) 98.1 F (36.7 C) (Oral)  Resp 14  Ht 5\' 8"  (1.727 m)  Wt 195 lb (88.451 kg)  BMI 29.66 kg/m2  SpO2 97% Physical Exam  Nursing note and vitals reviewed. Constitutional: He is oriented to person, place, and time. He appears well-developed and well-nourished.  HENT:  Head: Normocephalic and atraumatic.  Eyes: EOM are normal.  Neck: Normal range of motion.  Cardiovascular: Normal rate, regular rhythm, normal heart sounds and intact distal pulses.   Pulmonary/Chest: Effort normal and breath sounds normal. No respiratory distress. He exhibits no tenderness.  Abdominal: Soft.  Epigastric and left upper quadrant abdominal tenderness without guarding or rebound.  Mild abdominal distention  Musculoskeletal: Normal range of motion. He exhibits no edema.  Right upper extremity is in a plaster splint.  Can't we'll right fingers.  Normal right radial pulse.  No obvious swelling noted of his right hand  Neurological: He is alert and oriented to person, place, and time.  Skin: Skin is warm and dry.  Psychiatric: He has a normal  mood and affect. Judgment normal.    ED Course  Procedures (including critical care time)  Angiocath insertion Performed by: Hoy Morn Consent: Verbal consent obtained. Risks and benefits: risks, benefits and alternatives were discussed Time out: Immediately prior to procedure a "time out" was called to verify the correct patient, procedure, equipment, support staff and site/side marked as required. Preparation: Patient was prepped and draped in the usual sterile fashion. Vein Location: left antecubital fossa Ultrasound Guided Gauge: 20 Normal blood  return and flush without difficulty Patient tolerance: Patient tolerated the procedure well with no immediate complications.    Labs Review Labs Reviewed  CBC WITH DIFFERENTIAL - Abnormal; Notable for the following:    MCHC 36.2 (*)    All other components within normal limits  BASIC METABOLIC PANEL - Abnormal; Notable for the following:    Sodium 132 (*)    Chloride 95 (*)    Glucose, Bld 111 (*)    GFR calc non Af Amer 82 (*)    All other components within normal limits  D-DIMER, QUANTITATIVE - Abnormal; Notable for the following:    D-Dimer, Quant 1.30 (*)    All other components within normal limits  HEPATIC FUNCTION PANEL - Abnormal; Notable for the following:    Albumin 3.4 (*)    AST 47 (*)    All other components within normal limits  TROPONIN I  LIPASE, BLOOD  TROPONIN I    Imaging Review Dg Chest Portable 1 View  10/31/2013   CLINICAL DATA:  Left chest and abdomen pain.  Constipation.  EXAM: PORTABLE CHEST - 1 VIEW  COMPARISON:  04/03/2008  FINDINGS: Postoperative changes in the mediastinum. The heart size and mediastinal contours are within normal limits. Both lungs are clear. The visualized skeletal structures are unremarkable.  IMPRESSION: No active disease.   Electronically Signed   By: Lucienne Capers M.D.   On: 10/31/2013 03:36  I personally reviewed the imaging tests through PACS system I reviewed  available ER/hospitalization records through the EMR    EKG Interpretation   Date/Time:  Monday October 31 2013 04:20:46 EDT Ventricular Rate:  63 PR Interval:  148 QRS Duration: 104 QT Interval:  411 QTC Calculation: 421 R Axis:   -28 Text Interpretation:  Sinus rhythm Probable left atrial enlargement  Borderline left axis deviation No significant change was found Confirmed  by Jinnifer Montejano  MD, Gurley Climer ([id number]) on 10/31/2013 8:06:00 AM      MDM   Final diagnoses:  None   Unclear etiology of patient's symptoms.  The majority of his complaints when I went in the room her more focused in his abdomen.  He was still having some chest pain as well.  Given the pleuritic component a d-dimer was obtained.  He is at risk for DVT given his recent surgery and immobilization of his right upper extremity.  Patient will undergo CT angiogram of his chest to further evaluate.  As the ER course when on his chest pain is completely resolved he continued to have upper abdominal discomfort and nausea.  CT scan of his abdomen and pelvis were also be performed.  My suspicion for aortic dissection is low at this time.  EKG x2 are without ischemic changes.  A second troponin will be added.  CT scan is pending at this time.  Care to Dr Betsey Holiday.      Hoy Morn, MD 10/31/13 1761  Hoy Morn, MD 10/31/13 [phone]

## 2013-11-01 ENCOUNTER — Encounter (HOSPITAL_BASED_OUTPATIENT_CLINIC_OR_DEPARTMENT_OTHER): Payer: Self-pay | Admitting: Orthopedic Surgery

## 2014-03-14 ENCOUNTER — Ambulatory Visit (INDEPENDENT_AMBULATORY_CARE_PROVIDER_SITE_OTHER): Payer: Worker's Compensation | Admitting: Sports Medicine

## 2014-03-14 ENCOUNTER — Encounter: Payer: Self-pay | Admitting: Sports Medicine

## 2014-03-14 VITALS — BP 122/65 | HR 61 | Ht 68.0 in | Wt 200.0 lb

## 2014-03-14 DIAGNOSIS — M25529 Pain in unspecified elbow: Secondary | ICD-10-CM

## 2014-03-14 DIAGNOSIS — M25521 Pain in right elbow: Secondary | ICD-10-CM

## 2014-03-14 DIAGNOSIS — M79629 Pain in unspecified upper arm: Secondary | ICD-10-CM

## 2014-03-14 DIAGNOSIS — G5621 Lesion of ulnar nerve, right upper limb: Secondary | ICD-10-CM | POA: Insufficient documentation

## 2014-03-14 MED ORDER — TRIAMCINOLONE ACETONIDE 10 MG/ML IJ SUSP
10.0000 mg | Freq: Once | INTRAMUSCULAR | Status: AC
  Administered 2014-03-14: 5 mg via INTRA_ARTICULAR

## 2014-03-14 NOTE — Progress Notes (Signed)
  [name] - 69 y.o. male MRN [id number]  Date of birth: 1945-05-03  Referring Physician: Fredonia Highland, MD Consultation request: Ulnar nerve entrapment  SUBJECTIVE:  Including CC & ROS.  Patient is a 69 yo male present by request of his orthopedic surgeon for continue ulnar nerve symptoms which developed after a few week of being in a sling after surgery for triceps tendon repair. Patient has had 4-6 weeks of pain localized over the cubital tunnel with radiating numbness and tingling down into the 4-5th digits. Pain is worse and radiating with full extension and full flexion. He has been working PT for is triceps strengthening and they have treated is ulnar nerve pain with a night splint and modalities with PT. The patient triceps tear was related to a work related injury.    ROS: Review of systems otherwise negative except for information present in HPI  HISTORY: Past Medical, Surgical, Social, and Family History Reviewed & Updated per EMR. Pertinent Historical Findings include: HTN, CAD, history of back  DATA REVIEWED: Last xray from July 2015 right after injury at work causing triceps muscle tear. No fracture on xray  PHYSICAL EXAM:  VS: BP:122/65 mmHg  HR:61bpm  TEMP: ( )  RESP:   HT:5\' 8"  (172.7 cm)   WT:200 lb (90.719 kg)  BMI:30.5 ELBOW EXAM: General: well nourished Skin of UE: warm; dry, no rashes, lesions, ecchymosis or erythema. Vascular: radial pulses 2+ bilaterally Neurologically: Sensation to light touch upper extremities equal and intact bilaterally.     Observation: no elbow edema, no swelling, no erythema, no bruising  Palpation: no tenderness to deep over the olecranon process, radial head, medial epicondyle or lateral epicondyle    ROM: normal supination and pronation, elbow extension, full flexion    Strength: Normal 5/5 strength with extension/ flexion, pronation and supination.           Special test: stable medial and lateral collateral ligaments, no  tenderness with finger extension   MSK [place]: Evaluation of the cubital tunnel revealed thickening and scar tissue around the ulnar nerve on the lateral assess close to the olecranon with some compression of the nerve with in the grove.   ASSESSMENT & PLAN: See problem based charting & AVS for pt instructions. Patient present for [place] evaluation of ulnar nerve entrapment follow repair of the triceps tear in 10/27/13. Patient has had ulnar neuropathic pain radiating into the fingers for several week.   Recommendations: -After ulnar nerve [place] evaluation in the cubital tunnel with scar tissue entrapment we recommended hydrodissection.  -Patient will be f/u with surgeon Dr. Percell Miller on 03/29/13 -Patient will continue the light duty previously discussed with Dr. Percell Miller at his last visit.   Injection procedure: Consent obtained and verified. Sterile betadine prep. Furthur cleansed with alcohol and betadine. Topical analgesic spray: Ethyl chloride. Injection Indication: ulnar nerve entrapment Approached in typical fashion with: lateral approach to transverse ulnar nerve using long axis  Approach with [place] 8L probe Meds: 0.5cc of 10mg  Kenalog 0.5cc of lidocaine 1%  Needle: 25g 5/8 inch Completed without difficulty  Aftercare instructions and Red flags advised. Advised to call if fevers/chills, erythema, induration, drainage, or persistent bleeding.

## 2014-03-23 NOTE — Addendum Note (Signed)
Addended by: Louie Casa on: 03/23/2014 10:41 AM   Modules accepted: Level of Service

## 2015-02-23 ENCOUNTER — Other Ambulatory Visit: Payer: Self-pay | Admitting: General Surgery

## 2015-03-23 DIAGNOSIS — R7302 Impaired glucose tolerance (oral): Secondary | ICD-10-CM | POA: Diagnosis not present

## 2015-03-23 DIAGNOSIS — Z125 Encounter for screening for malignant neoplasm of prostate: Secondary | ICD-10-CM | POA: Diagnosis not present

## 2015-03-23 DIAGNOSIS — I10 Essential (primary) hypertension: Secondary | ICD-10-CM | POA: Diagnosis not present

## 2015-03-30 DIAGNOSIS — H9190 Unspecified hearing loss, unspecified ear: Secondary | ICD-10-CM | POA: Diagnosis not present

## 2015-03-30 DIAGNOSIS — I209 Angina pectoris, unspecified: Secondary | ICD-10-CM | POA: Diagnosis not present

## 2015-03-30 DIAGNOSIS — Z1212 Encounter for screening for malignant neoplasm of rectum: Secondary | ICD-10-CM | POA: Diagnosis not present

## 2015-03-30 DIAGNOSIS — N183 Chronic kidney disease, stage 3 (moderate): Secondary | ICD-10-CM | POA: Diagnosis not present

## 2015-03-30 DIAGNOSIS — Z0000 Encounter for general adult medical examination without abnormal findings: Secondary | ICD-10-CM | POA: Diagnosis not present

## 2015-03-30 DIAGNOSIS — Z1389 Encounter for screening for other disorder: Secondary | ICD-10-CM | POA: Diagnosis not present

## 2015-03-30 DIAGNOSIS — E668 Other obesity: Secondary | ICD-10-CM | POA: Diagnosis not present

## 2015-03-30 DIAGNOSIS — Z6832 Body mass index (BMI) 32.0-32.9, adult: Secondary | ICD-10-CM | POA: Diagnosis not present

## 2015-03-30 DIAGNOSIS — F5221 Male erectile disorder: Secondary | ICD-10-CM | POA: Diagnosis not present

## 2015-03-30 DIAGNOSIS — Z9861 Coronary angioplasty status: Secondary | ICD-10-CM | POA: Diagnosis not present

## 2015-03-30 DIAGNOSIS — I1310 Hypertensive heart and chronic kidney disease without heart failure, with stage 1 through stage 4 chronic kidney disease, or unspecified chronic kidney disease: Secondary | ICD-10-CM | POA: Diagnosis not present

## 2015-03-30 DIAGNOSIS — E291 Testicular hypofunction: Secondary | ICD-10-CM | POA: Diagnosis not present

## 2015-03-30 DIAGNOSIS — R7302 Impaired glucose tolerance (oral): Secondary | ICD-10-CM | POA: Diagnosis not present

## 2015-07-26 DIAGNOSIS — E782 Mixed hyperlipidemia: Secondary | ICD-10-CM | POA: Diagnosis not present

## 2015-07-26 DIAGNOSIS — I25119 Atherosclerotic heart disease of native coronary artery with unspecified angina pectoris: Secondary | ICD-10-CM | POA: Diagnosis not present

## 2015-08-01 DIAGNOSIS — I209 Angina pectoris, unspecified: Secondary | ICD-10-CM | POA: Diagnosis not present

## 2015-08-01 DIAGNOSIS — R0609 Other forms of dyspnea: Secondary | ICD-10-CM | POA: Diagnosis not present

## 2015-08-01 DIAGNOSIS — I25119 Atherosclerotic heart disease of native coronary artery with unspecified angina pectoris: Secondary | ICD-10-CM | POA: Diagnosis not present

## 2015-08-09 DIAGNOSIS — I2510 Atherosclerotic heart disease of native coronary artery without angina pectoris: Secondary | ICD-10-CM | POA: Diagnosis not present

## 2015-08-09 DIAGNOSIS — R0602 Shortness of breath: Secondary | ICD-10-CM | POA: Diagnosis not present

## 2015-08-09 DIAGNOSIS — I209 Angina pectoris, unspecified: Secondary | ICD-10-CM | POA: Diagnosis not present

## 2015-08-10 DIAGNOSIS — R0602 Shortness of breath: Secondary | ICD-10-CM | POA: Diagnosis not present

## 2015-08-10 DIAGNOSIS — I200 Unstable angina: Secondary | ICD-10-CM | POA: Diagnosis not present

## 2015-08-10 DIAGNOSIS — I10 Essential (primary) hypertension: Secondary | ICD-10-CM | POA: Diagnosis not present

## 2015-08-10 DIAGNOSIS — I2510 Atherosclerotic heart disease of native coronary artery without angina pectoris: Secondary | ICD-10-CM | POA: Diagnosis not present

## 2015-09-19 DIAGNOSIS — H353111 Nonexudative age-related macular degeneration, right eye, early dry stage: Secondary | ICD-10-CM | POA: Diagnosis not present

## 2015-09-19 DIAGNOSIS — H52222 Regular astigmatism, left eye: Secondary | ICD-10-CM | POA: Diagnosis not present

## 2015-09-19 DIAGNOSIS — H40013 Open angle with borderline findings, low risk, bilateral: Secondary | ICD-10-CM | POA: Diagnosis not present

## 2015-09-19 DIAGNOSIS — H11423 Conjunctival edema, bilateral: Secondary | ICD-10-CM | POA: Diagnosis not present

## 2015-09-19 DIAGNOSIS — H11153 Pinguecula, bilateral: Secondary | ICD-10-CM | POA: Diagnosis not present

## 2015-09-19 DIAGNOSIS — H18413 Arcus senilis, bilateral: Secondary | ICD-10-CM | POA: Diagnosis not present

## 2015-09-19 DIAGNOSIS — H18893 Other specified disorders of cornea, bilateral: Secondary | ICD-10-CM | POA: Diagnosis not present

## 2015-09-19 DIAGNOSIS — H5212 Myopia, left eye: Secondary | ICD-10-CM | POA: Diagnosis not present

## 2015-09-19 DIAGNOSIS — Z9849 Cataract extraction status, unspecified eye: Secondary | ICD-10-CM | POA: Diagnosis not present

## 2015-09-19 DIAGNOSIS — H5340 Unspecified visual field defects: Secondary | ICD-10-CM | POA: Diagnosis not present

## 2015-09-19 DIAGNOSIS — H5201 Hypermetropia, right eye: Secondary | ICD-10-CM | POA: Diagnosis not present

## 2015-09-19 DIAGNOSIS — Z961 Presence of intraocular lens: Secondary | ICD-10-CM | POA: Diagnosis not present

## 2015-09-25 DIAGNOSIS — Z9861 Coronary angioplasty status: Secondary | ICD-10-CM | POA: Diagnosis not present

## 2015-09-25 DIAGNOSIS — I130 Hypertensive heart and chronic kidney disease with heart failure and stage 1 through stage 4 chronic kidney disease, or unspecified chronic kidney disease: Secondary | ICD-10-CM | POA: Diagnosis not present

## 2015-09-25 DIAGNOSIS — I209 Angina pectoris, unspecified: Secondary | ICD-10-CM | POA: Diagnosis not present

## 2015-09-25 DIAGNOSIS — I272 Other secondary pulmonary hypertension: Secondary | ICD-10-CM | POA: Diagnosis not present

## 2015-09-25 DIAGNOSIS — N183 Chronic kidney disease, stage 3 (moderate): Secondary | ICD-10-CM | POA: Diagnosis not present

## 2015-09-25 DIAGNOSIS — I519 Heart disease, unspecified: Secondary | ICD-10-CM | POA: Diagnosis not present

## 2015-09-25 DIAGNOSIS — Z1389 Encounter for screening for other disorder: Secondary | ICD-10-CM | POA: Diagnosis not present

## 2015-09-25 DIAGNOSIS — Z6832 Body mass index (BMI) 32.0-32.9, adult: Secondary | ICD-10-CM | POA: Diagnosis not present

## 2015-09-25 DIAGNOSIS — E291 Testicular hypofunction: Secondary | ICD-10-CM | POA: Diagnosis not present

## 2015-09-25 DIAGNOSIS — D692 Other nonthrombocytopenic purpura: Secondary | ICD-10-CM | POA: Diagnosis not present

## 2015-09-25 DIAGNOSIS — E668 Other obesity: Secondary | ICD-10-CM | POA: Diagnosis not present

## 2015-09-25 DIAGNOSIS — R7302 Impaired glucose tolerance (oral): Secondary | ICD-10-CM | POA: Diagnosis not present

## 2016-02-06 DIAGNOSIS — M545 Low back pain: Secondary | ICD-10-CM | POA: Diagnosis not present

## 2016-02-11 DIAGNOSIS — E782 Mixed hyperlipidemia: Secondary | ICD-10-CM | POA: Diagnosis not present

## 2016-02-11 DIAGNOSIS — R0609 Other forms of dyspnea: Secondary | ICD-10-CM | POA: Diagnosis not present

## 2016-02-11 DIAGNOSIS — I209 Angina pectoris, unspecified: Secondary | ICD-10-CM | POA: Diagnosis not present

## 2016-02-11 DIAGNOSIS — I25119 Atherosclerotic heart disease of native coronary artery with unspecified angina pectoris: Secondary | ICD-10-CM | POA: Diagnosis not present

## 2016-02-22 DIAGNOSIS — R062 Wheezing: Secondary | ICD-10-CM | POA: Diagnosis not present

## 2016-03-16 ENCOUNTER — Emergency Department (HOSPITAL_COMMUNITY): Payer: PPO

## 2016-03-16 ENCOUNTER — Encounter (HOSPITAL_COMMUNITY): Payer: Self-pay | Admitting: *Deleted

## 2016-03-16 ENCOUNTER — Observation Stay (HOSPITAL_COMMUNITY): Payer: PPO

## 2016-03-16 ENCOUNTER — Observation Stay (HOSPITAL_COMMUNITY)
Admission: EM | Admit: 2016-03-16 | Discharge: 2016-03-17 | Disposition: A | Discharge status: Home / Self Care | Payer: PPO | Attending: Internal Medicine | Admitting: Internal Medicine

## 2016-03-16 DIAGNOSIS — R0789 Other chest pain: Principal | ICD-10-CM | POA: Insufficient documentation

## 2016-03-16 DIAGNOSIS — Z8611 Personal history of tuberculosis: Secondary | ICD-10-CM | POA: Diagnosis not present

## 2016-03-16 DIAGNOSIS — R079 Chest pain, unspecified: Secondary | ICD-10-CM | POA: Diagnosis present

## 2016-03-16 DIAGNOSIS — I2510 Atherosclerotic heart disease of native coronary artery without angina pectoris: Secondary | ICD-10-CM | POA: Diagnosis not present

## 2016-03-16 DIAGNOSIS — I10 Essential (primary) hypertension: Secondary | ICD-10-CM | POA: Diagnosis not present

## 2016-03-16 DIAGNOSIS — R1031 Right lower quadrant pain: Secondary | ICD-10-CM | POA: Diagnosis not present

## 2016-03-16 DIAGNOSIS — E785 Hyperlipidemia, unspecified: Secondary | ICD-10-CM | POA: Diagnosis present

## 2016-03-16 DIAGNOSIS — R109 Unspecified abdominal pain: Secondary | ICD-10-CM

## 2016-03-16 DIAGNOSIS — E7849 Other hyperlipidemia: Secondary | ICD-10-CM

## 2016-03-16 DIAGNOSIS — E782 Mixed hyperlipidemia: Secondary | ICD-10-CM | POA: Insufficient documentation

## 2016-03-16 DIAGNOSIS — Z955 Presence of coronary angioplasty implant and graft: Secondary | ICD-10-CM | POA: Diagnosis not present

## 2016-03-16 DIAGNOSIS — R072 Precordial pain: Secondary | ICD-10-CM | POA: Diagnosis not present

## 2016-03-16 DIAGNOSIS — Z951 Presence of aortocoronary bypass graft: Secondary | ICD-10-CM | POA: Insufficient documentation

## 2016-03-16 DIAGNOSIS — K5900 Constipation, unspecified: Secondary | ICD-10-CM | POA: Diagnosis not present

## 2016-03-16 DIAGNOSIS — Z7982 Long term (current) use of aspirin: Secondary | ICD-10-CM | POA: Diagnosis not present

## 2016-03-16 DIAGNOSIS — K219 Gastro-esophageal reflux disease without esophagitis: Secondary | ICD-10-CM | POA: Insufficient documentation

## 2016-03-16 DIAGNOSIS — R0602 Shortness of breath: Secondary | ICD-10-CM | POA: Diagnosis not present

## 2016-03-16 DIAGNOSIS — I25119 Atherosclerotic heart disease of native coronary artery with unspecified angina pectoris: Secondary | ICD-10-CM | POA: Diagnosis present

## 2016-03-16 DIAGNOSIS — I25118 Atherosclerotic heart disease of native coronary artery with other forms of angina pectoris: Secondary | ICD-10-CM | POA: Diagnosis present

## 2016-03-16 HISTORY — DX: Angina pectoris, unspecified: I20.9

## 2016-03-16 HISTORY — DX: Pneumonia, unspecified organism: J18.9

## 2016-03-16 LAB — CBC
HCT: 44 % (ref 39.0–52.0)
HEMOGLOBIN: 15.6 g/dL (ref 13.0–17.0)
MCH: 33.2 pg (ref 26.0–34.0)
MCHC: 35.5 g/dL (ref 30.0–36.0)
MCV: 93.6 fL (ref 78.0–100.0)
Platelets: 181 10*3/uL (ref 150–400)
RBC: 4.7 MIL/uL (ref 4.22–5.81)
RDW: 13 % (ref 11.5–15.5)
WBC: 7.3 10*3/uL (ref 4.0–10.5)

## 2016-03-16 LAB — I-STAT TROPONIN, ED: Troponin i, poc: 0 ng/mL (ref 0.00–0.08)

## 2016-03-16 LAB — TROPONIN I: Troponin I: <0.03 ng/mL (ref <0.03)

## 2016-03-16 LAB — BASIC METABOLIC PANEL
ANION GAP: 9 (ref 5–15)
BUN: 23 mg/dL — ABNORMAL HIGH (ref 6–20)
CALCIUM: 9.1 mg/dL (ref 8.9–10.3)
CO2: 23 mmol/L (ref 22–32)
Chloride: 103 mmol/L (ref 101–111)
Creatinine, Ser: 1.19 mg/dL (ref 0.61–1.24)
GFR calc Af Amer: >60 mL/min (ref >60)
GFR calc non Af Amer: 60 mL/min — ABNORMAL LOW (ref >60)
GLUCOSE: 137 mg/dL — AB (ref 65–99)
POTASSIUM: 4.5 mmol/L (ref 3.5–5.1)
Sodium: 135 mmol/L (ref 135–145)

## 2016-03-16 LAB — URINALYSIS, ROUTINE W REFLEX MICROSCOPIC
Bilirubin Urine: NEGATIVE
Glucose, UA: NEGATIVE mg/dL
Hgb urine dipstick: NEGATIVE
Ketones, ur: NEGATIVE mg/dL
LEUKOCYTES UA: NEGATIVE
Nitrite: NEGATIVE
Protein, ur: NEGATIVE mg/dL
SPECIFIC GRAVITY, URINE: 1.023 (ref 1.005–1.030)
pH: 5 (ref 5.0–8.0)

## 2016-03-16 LAB — LIPID PANEL
CHOLESTEROL: 84 mg/dL (ref 0–200)
HDL: 32 mg/dL — ABNORMAL LOW (ref >40)
LDL CALC: 39 mg/dL (ref 0–99)
TRIGLYCERIDES: 67 mg/dL (ref <150)
Total CHOL/HDL Ratio: 2.6 RATIO
VLDL: 13 mg/dL (ref 0–40)

## 2016-03-16 LAB — HEPATIC FUNCTION PANEL
ALT: 19 U/L (ref 17–63)
AST: 20 U/L (ref 15–41)
Albumin: 3.2 g/dL — ABNORMAL LOW (ref 3.5–5.0)
Alkaline Phosphatase: 43 U/L (ref 38–126)
BILIRUBIN DIRECT: 0.3 mg/dL (ref 0.1–0.5)
Indirect Bilirubin: 1.5 mg/dL — ABNORMAL HIGH (ref 0.3–0.9)
Total Bilirubin: 1.8 mg/dL — ABNORMAL HIGH (ref 0.3–1.2)
Total Protein: 6.4 g/dL — ABNORMAL LOW (ref 6.5–8.1)

## 2016-03-16 LAB — MRSA PCR SCREENING: MRSA BY PCR: NEGATIVE

## 2016-03-16 LAB — I-STAT CG4 LACTIC ACID, ED: Lactic Acid, Venous: 1.73 mmol/L (ref 0.5–1.9)

## 2016-03-16 LAB — LIPASE, BLOOD: Lipase: 13 U/L (ref 11–51)

## 2016-03-16 MED ORDER — ALBUTEROL SULFATE HFA 108 (90 BASE) MCG/ACT IN AERS: 2.0000 | INHALATION_SPRAY | Freq: Every day | RESPIRATORY_TRACT | Status: DC | PRN

## 2016-03-16 MED ORDER — ROSUVASTATIN CALCIUM 20 MG PO TABS
20.0000 mg | ORAL_TABLET | Freq: Every day | ORAL | Status: DC
  Filled 2016-03-16 (×2): qty 1

## 2016-03-16 MED ORDER — GI COCKTAIL ~~LOC~~
30.0000 mL | Freq: Four times a day (QID) | ORAL | Status: DC | PRN
  Administered 2016-03-16: 30 mL via ORAL
  Filled 2016-03-16: qty 30

## 2016-03-16 MED ORDER — METOPROLOL TARTRATE 12.5 MG HALF TABLET
12.5000 mg | ORAL_TABLET | Freq: Two times a day (BID) | ORAL | Status: DC
  Administered 2016-03-16 – 2016-03-17 (×2): 12.5 mg via ORAL
  Filled 2016-03-16 (×2): qty 1

## 2016-03-16 MED ORDER — LOSARTAN POTASSIUM 25 MG PO TABS
25.0000 mg | ORAL_TABLET | Freq: Every day | ORAL | Status: DC
  Administered 2016-03-17: 25 mg via ORAL
  Filled 2016-03-16 (×2): qty 1

## 2016-03-16 MED ORDER — ONDANSETRON HCL 4 MG/2ML IJ SOLN
4.0000 mg | Freq: Four times a day (QID) | INTRAMUSCULAR | Status: DC | PRN
  Administered 2016-03-16: 4 mg via INTRAVENOUS
  Filled 2016-03-16: qty 2

## 2016-03-16 MED ORDER — TESTOSTERONE 50 MG/5GM (1%) TD GEL: 15.0000 g | Freq: Every day | TRANSDERMAL | Status: DC

## 2016-03-16 MED ORDER — OXYCODONE-ACETAMINOPHEN 5-325 MG PO TABS: 2.0000 | ORAL_TABLET | ORAL | Status: DC | PRN

## 2016-03-16 MED ORDER — ALBUTEROL SULFATE (2.5 MG/3ML) 0.083% IN NEBU: 3.0000 mL | INHALATION_SOLUTION | Freq: Every day | RESPIRATORY_TRACT | Status: DC | PRN

## 2016-03-16 MED ORDER — IOPAMIDOL (ISOVUE-300) INJECTION 61%
INTRAVENOUS | Status: AC
  Administered 2016-03-16: 100 mL
  Filled 2016-03-16: qty 30

## 2016-03-16 MED ORDER — PANTOPRAZOLE SODIUM 20 MG PO TBEC
20.0000 mg | DELAYED_RELEASE_TABLET | Freq: Every day | ORAL | Status: DC
  Administered 2016-03-17: 20 mg via ORAL
  Filled 2016-03-16 (×2): qty 1

## 2016-03-16 MED ORDER — TESTOSTERONE 20.25 MG/ACT (1.62%) TD GEL: 3.0000 | Freq: Every day | TRANSDERMAL | Status: DC

## 2016-03-16 MED ORDER — SODIUM CHLORIDE 0.9 % IV BOLUS (SEPSIS)
1000.0000 mL | Freq: Once | INTRAVENOUS | Status: AC
  Administered 2016-03-16: 1000 mL via INTRAVENOUS

## 2016-03-16 MED ORDER — ACETAMINOPHEN 325 MG PO TABS
650.0000 mg | ORAL_TABLET | ORAL | Status: DC | PRN
  Administered 2016-03-16 – 2016-03-17 (×2): 650 mg via ORAL
  Filled 2016-03-16 (×2): qty 2

## 2016-03-16 MED ORDER — LOSARTAN POTASSIUM 50 MG PO TABS: 50.0000 mg | ORAL_TABLET | Freq: Every day | ORAL | Status: DC

## 2016-03-16 MED ORDER — IOPAMIDOL (ISOVUE-300) INJECTION 61%
INTRAVENOUS | Status: AC
  Filled 2016-03-16: qty 100

## 2016-03-16 MED ORDER — RANOLAZINE ER 500 MG PO TB12
1000.0000 mg | ORAL_TABLET | Freq: Two times a day (BID) | ORAL | Status: DC
  Administered 2016-03-17: 1000 mg via ORAL
  Filled 2016-03-16 (×2): qty 2

## 2016-03-16 MED ORDER — POLYETHYLENE GLYCOL 3350 17 G PO PACK: 17.0000 g | PACK | Freq: Every day | ORAL | Status: DC | PRN

## 2016-03-16 MED ORDER — NITROGLYCERIN IN D5W 200-5 MCG/ML-% IV SOLN
0.0000 ug/min | INTRAVENOUS | Status: DC
  Administered 2016-03-16: 5 ug/min via INTRAVENOUS
  Filled 2016-03-16: qty 250

## 2016-03-16 MED ORDER — ASPIRIN 325 MG PO TABS
325.0000 mg | ORAL_TABLET | Freq: Every day | ORAL | Status: DC
  Filled 2016-03-16: qty 1

## 2016-03-16 MED ORDER — DOCUSATE SODIUM 100 MG PO CAPS
100.0000 mg | ORAL_CAPSULE | Freq: Two times a day (BID) | ORAL | Status: DC
  Filled 2016-03-16: qty 1

## 2016-03-16 MED ORDER — POLYETHYL GLYCOL-PROPYL GLYCOL 0.4-0.3 % OP GEL
1.0000 "application " | Freq: Every day | OPHTHALMIC | Status: DC
  Filled 2016-03-16: qty 10

## 2016-03-16 MED ORDER — ARTIFICIAL TEARS OP OINT
TOPICAL_OINTMENT | Freq: Every day | OPHTHALMIC | Status: DC
  Administered 2016-03-16: 1 via OPHTHALMIC
  Filled 2016-03-16 (×2): qty 3.5

## 2016-03-16 MED ORDER — MORPHINE SULFATE (PF) 4 MG/ML IV SOLN: 2.0000 mg | INTRAVENOUS | Status: DC | PRN

## 2016-03-16 MED ORDER — HEPARIN SODIUM (PORCINE) 5000 UNIT/ML IJ SOLN
5000.0000 [IU] | Freq: Three times a day (TID) | INTRAMUSCULAR | Status: DC
  Administered 2016-03-16 – 2016-03-17 (×2): 5000 [IU] via SUBCUTANEOUS
  Filled 2016-03-16 (×2): qty 1

## 2016-03-16 MED ORDER — METHOCARBAMOL 500 MG PO TABS: 500.0000 mg | ORAL_TABLET | Freq: Four times a day (QID) | ORAL | Status: DC | PRN

## 2016-03-16 NOTE — ED Provider Notes (Signed)
[facility] DEPT Provider Note   CSN: [id number] Arrival date & time: 03/16/16  1041     History   Chief Complaint No chief complaint on file.   HPI [name] is a 71 y.o. male.  71 year old Caucasian male with a past medical history significant for CAD status post quadruple bypass with 3 stents placed approx 11 year ago, hypertension that presents to the ED today with chest pain. Patient states the chest pain started approximately 8 AM this morning. Describes it as a pressure that was substernal and radiated to the left chest. Denies any radiation to the left arm or jaw. He reports being hot and cold with the episode. He also reports mild nausea no emesis. Patient took 2 nitroglycerin and aspirin at home that relieved the pain. States that the pressure is about a 2 out of 10 at this time. Patient endorses mild shortness of breath. The chest pain is not pleuritic in nature. The patient denies any exertional chest pain. States this feels different than his prior cp. Patient denies any history of DVT, unilateral leg swelling, prolonged immobilization/surgeries, tobacco use. Patient reports a 3 day history of cough that is nonproductive. Denies any other symptoms. Patient denies any fever, chills, headache, vision changes, lightheadedness, dizziness, abdominal pain, urinary symptoms, change in bowel habits.      Past Medical History:  Diagnosis Date  . Arthritis   . Blood transfusion without reported diagnosis 2006   during open heart surgery  . CAD (coronary artery disease)   . GERD (gastroesophageal reflux disease)   . Hyperlipidemia   . Hypertension   . Tuberculosis 1956    Patient Active Problem List   Diagnosis Date Noted  . 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 ANGIOPLASTY  2010   3 stents placed  . CATARACT EXTRACTION W/ INTRAOCULAR LENS  IMPLANT, BILATERAL  2012   bilateral  .  COLONOSCOPY    . heart bypass  2006   4 vessels  . KNEE ARTHROSCOPY  2001  . [place] SURGERY  1998  . TENDON RECONSTRUCTION Right 10/27/2013   Procedure: RIGHT OPEN TRICEPS REPAIR;  Surgeon: Renette Butters, MD;  Location: [facility];  Service: Orthopedics;  Laterality: Right;       Home Medications    Prior to Admission medications   Medication Sig Start Date End Date Taking? Authorizing Provider  acetaminophen (TYLENOL) 500 MG tablet Take 1,000 mg by mouth every morning.    Historical Provider, MD  aspirin 325 MG tablet Take 325 mg by mouth daily.    Historical Provider, MD  Calcium Carbonate Antacid (TUMS ULTRA PO) Take 2 tablets by mouth daily as needed (acid).     Historical Provider, MD  co-enzyme Q-10 30 MG capsule Take 100 mg by mouth 2 (two) times daily.     Historical Provider, MD  CRESTOR 20 MG tablet Take 20 mg by mouth daily.  12/04/11   Historical Provider, MD  docusate sodium (COLACE) 100 MG capsule Take 1 capsule (100 mg total) by mouth 2 (two) times daily. 10/27/13   Renette Butters, MD  fish oil-omega-3 fatty acids 1000 MG capsule Take 1 g by mouth 2 (two) times daily.    Historical Provider, MD  furosemide (LASIX) 20 MG tablet  02/10/14   Historical Provider, MD  lansoprazole (PREVACID) 15 MG capsule Take 15 mg by mouth 2 (two) times daily.  Historical Provider, MD  losartan (COZAAR) 100 MG tablet Take 50 mg by mouth daily. Takes 1/2 tablet daily    Historical Provider, MD  methocarbamol (ROBAXIN) 500 MG tablet Take 500 mg by mouth every 6 (six) hours as needed for muscle spasms.    Historical Provider, MD  metoprolol tartrate (LOPRESSOR) 25 MG tablet Take 12.5 mg by mouth 2 (two) times daily. Takes 1/2 tablet daily 12/19/11   Historical Provider, MD  Multiple Vitamin (MULTIVITAMIN) tablet Take 1 tablet by mouth daily.    Historical Provider, MD  Multiple Vitamins-Minerals (OCUVITE PRESERVISION PO) Take 1 tablet by mouth daily.    Historical  Provider, MD  NITROSTAT 0.4 MG SL tablet Place 0.4 mg under the tongue every 5 (five) minutes as needed for chest pain.  10/28/11   Historical Provider, MD  ondansetron (ZOFRAN) 4 MG tablet Take 1 tablet (4 mg total) by mouth every 8 (eight) hours as needed for nausea or vomiting. 10/27/13   Renette Butters, MD  oxyCODONE-acetaminophen (PERCOCET/ROXICET) 5-325 MG per tablet Take 2 tablets by mouth every 4 (four) hours as needed for severe pain.    Historical Provider, MD  Polyethyl Glycol-Propyl Glycol (SYSTANE OP) Apply 1-2 drops to eye daily as needed (dry eyes).     Historical Provider, MD  Polyethyl Glycol-Propyl Glycol (SYSTANE) 0.4-0.3 % GEL Apply 1-2 drops to eye daily as needed (dry eyes).     Historical Provider, MD  polyethylene glycol (MIRALAX / GLYCOLAX) packet Take 17 g by mouth daily as needed for moderate constipation. 10/31/13   Orpah Greek, MD  RANEXA 1000 MG SR tablet Take 1,000 mg by mouth 2 (two) times daily.  01/28/12   Historical Provider, MD  tadalafil (CIALIS) 20 MG tablet Take 20 mg by mouth daily as needed for erectile dysfunction.     Historical Provider, MD  vitamin C (ASCORBIC ACID) 500 MG tablet Take 500 mg by mouth daily.    Historical Provider, MD  ZETIA 10 MG tablet Take 5 mg by mouth daily. Takes 1/2 tablet daily 12/19/11   Historical Provider, MD    Family History Family History  Problem Relation Age of Onset  . Colon cancer Mother 70  . Colon cancer Father 39  . Stomach cancer Neg Hx     Social History Social History  Substance Use Topics  . Smoking status: Never Smoker  . Smokeless tobacco: Never Used  . Alcohol use No     Allergies   Codeine and Penicillins   Review of Systems Review of Systems  Constitutional: Negative for chills and fever.  HENT: Negative for congestion.   Respiratory: Positive for cough and shortness of breath.   Cardiovascular: Positive for chest pain. Negative for palpitations and leg swelling.    Gastrointestinal: Negative for abdominal pain, diarrhea, nausea and vomiting.  Genitourinary: Negative for dysuria, flank pain, frequency, hematuria and urgency.  Musculoskeletal: Negative for back pain.  Skin: Negative.   Neurological: Negative for dizziness, syncope, weakness, light-headedness, numbness and headaches.  All other systems reviewed and are negative.    Physical Exam Updated Vital Signs There were no vitals taken for this visit.  Physical Exam  Constitutional: He is oriented to person, place, and time. He appears well-developed and well-nourished. No distress.  Patient lying in stretcher in mild discomfort due to pain.  HENT:  Head: Normocephalic and atraumatic.  Mouth/Throat: Oropharynx is clear and moist.  Eyes: Conjunctivae are normal. Pupils are equal, round, and reactive to light. Right eye  exhibits no discharge. Left eye exhibits no discharge. No scleral icterus.  Neck: Normal range of motion. Neck supple. No thyromegaly present.  Cardiovascular: Normal rate, regular rhythm, normal heart sounds and intact distal pulses.  Exam reveals no gallop and no friction rub.   No murmur heard. Pulmonary/Chest: Effort normal and breath sounds normal. No respiratory distress. He has no wheezes.  Chest pain is not reproducible with palpation of chest wall.  Abdominal: Soft. Bowel sounds are normal. He exhibits no distension. There is no tenderness. There is no rebound and no guarding.  Musculoskeletal: Normal range of motion.  Lymphadenopathy:    He has no cervical adenopathy.  Neurological: He is alert and oriented to person, place, and time.  Skin: Skin is warm and dry. Capillary refill takes less than 2 seconds.  Nursing note and vitals reviewed.    ED Treatments / Results  Labs (all labs ordered are listed, but only abnormal results are displayed) Labs Reviewed  BASIC METABOLIC PANEL - Abnormal; Notable for the following:       Result Value   Glucose, Bld 137 (*)     BUN 23 (*)    GFR calc non Af Amer 60 (*)    All other components within normal limits  CBC  I-STAT TROPOININ, ED    EKG  EKG Interpretation  Date/Time:  Sunday March 16 2016 10:49:58 EST Ventricular Rate:  94 PR Interval:    QRS Duration: 104 QT Interval:  338 QTC Calculation: 423 R Axis:   -67 Text Interpretation:  Sinus rhythm Left anterior fascicular block Abnormal R-wave progression, late transition No significant change since last tracing Confirmed by Winfred Leeds  MD, [facility] [phone]) on 03/16/2016 10:59:15 AM Also confirmed by Winfred Leeds  MD, [place] [phone]), editor [place], Leda Gauze [phone])  on 03/16/2016 12:05:05 PM       Radiology Dg Chest Port 1 View  Result Date: 03/16/2016 CLINICAL DATA:  Chest pain and nausea, known coronary disease, remote bypass EXAM: PORTABLE CHEST 1 VIEW COMPARISON:  10/31/2013 FINDINGS: Previous coronary bypass changes noted. Stable heart size and vascularity. Lungs remain clear. No focal pneumonia, collapse or consolidation. Negative for edema, effusion or pneumothorax. Trachea is midline. Monitor leads overlie the chest. No acute osseous finding. IMPRESSION: Stable postoperative findings.  No superimposed acute chest process Electronically Signed   By: Jerilynn Mages.  Shick M.D.   On: 03/16/2016 11:08    Procedures Procedures (including critical care time)  Medications Ordered in ED Medications  nitroGLYCERIN 50 mg in dextrose 5 % 250 mL (0.2 mg/mL) infusion (15 mcg/min Intravenous Rate/Dose Change 03/16/16 1226)  sodium chloride 0.9 % bolus 1,000 mL (1,000 mLs Intravenous New Bag/Given 03/16/16 1118)     Initial Impression / Assessment and Plan / ED Course  I have reviewed the triage vital signs and the nursing notes.  Pertinent labs & imaging results that were available during my care of the patient were reviewed by me and considered in my medical decision making (see chart for details).  Clinical Course   Patient presents to the ED with chest pain. History  of bypass surgery and 3 stent placements 11 years ago. Concern for cardiac etiology of Chest Pain. Cardiology has been consulted and will see patient in the once admitted to the hospital. Talked with patient cardiologist Dr. Essie Hart who will see patient in the ED. Patient was continued to have chest pain in the ED. Rates his pain a 5 out of 10. He was started on nitroglycerin drip. Initial blood  pressure was slightly hypotensive. Patient given a liter of fluid. Blood pressure improved. Patient was started on nitro drip. Patient states that his pain has reduced from a 4 out of 10-2 out of 10. Chest x-ray was unremarkable. EKG without any significant changes. First troponin was negative. Chest pain is not pleuritic in nature. Patient has no history of DVT. Doubt PE. Pt has been re-evaluated prior to consult and VSS, NAD, heart RRR, pain 2/10, lungs CTAB. No acute abnormalities found on EKG and first round of cardiac enzymes negative. This case was discussed with Dr. Winfred Leeds who has seen the patient and agrees with plan to admit. Consult tried hospitalist. Dr. Aggie Moats agrees to admit patient to stepdown due to the nitro drip. Patient is hemodynamically stable this time. Patient informed of plan.  CRITICAL CARE Performed by: Ocie Cornfield   Total critical care time: 30 minutes  Critical care time was exclusive of separately billable procedures and treating other patients.  Critical care was necessary to treat or prevent imminent or life-threatening deterioration.  Critical care was time spent personally by me on the following activities: development of treatment plan with patient and/or surrogate as well as nursing, discussions with consultants, evaluation of patient's response to treatment, examination of patient, obtaining history from patient or surrogate, ordering and performing treatments and interventions, ordering and review of laboratory studies, ordering and review of radiographic studies, pulse  oximetry and re-evaluation of patient's condition.    Final Clinical Impressions(s) / ED Diagnoses   Final diagnoses:  Chest pain, unspecified type    New Prescriptions New Prescriptions   No medications on file     Doristine Devoid, PA-C 03/16/16 1235    Doristine Devoid, PA-C 03/16/16 [place], MD 03/16/16 1700

## 2016-03-16 NOTE — ED Notes (Signed)
RN and tech attempted bedside commode with pt. Pt became sweaty and vomited. Pt was unable to use restroom and placed back in bed. Pt now comfortable. Will continue to monitor.

## 2016-03-16 NOTE — ED Notes (Signed)
Lab at bedside collecting blood

## 2016-03-16 NOTE — Consult Note (Signed)
CARDIOLOGY CONSULT NOTE  Patient ID: [name] MRN: [id number] DOB/AGE: 1945/12/09 71 y.o.  Admit date: 03/16/2016 Referring Physician  [place] service Primary Physician:  Haywood Pao, MD Reason for Consultation  Chest pain  HPI: [name]  is a 71 y.o. male  with known history of coronary artery disease and chronic stable angina.He takes his nitro once or twice a month.  He woke up not feeling well and started having chest pain since 8 am and presented to the ED.   He has been having significant nausea,chest pain is continuous, points to middle of the chest and also lower chest area and upper abdominal area.  His wife is present the bedside  He states that he did take nitroglycerin with her shortly for chest pain  Due to ongoing chest pain, he was started on IV nitroglycerin.  In spite of this she continues to have chest pain and overall does not feel well.  He has chronic dyspnea, Which is mild and is remained stable. He denies any fever, but did notice that he was feeling cold and was having mild chills. No headache, no visual disturbances, no neurologic deficits.  Denies symptoms to suggest TIA or claudication.  No hemoptysis, no melena or bloody stools.  No recent weight change.  Past Medical History:  Diagnosis Date  . Arthritis   . Blood transfusion without reported diagnosis 2006   during open heart surgery  . CAD (coronary artery disease)   . GERD (gastroesophageal reflux disease)   . Hyperlipidemia   . Hypertension   . Tuberculosis 1956     Past Surgical History:  Procedure Laterality Date  . ANGIOPLASTY  2010   3 stents placed  . CATARACT EXTRACTION W/ INTRAOCULAR LENS  IMPLANT, BILATERAL  2012   bilateral  . COLONOSCOPY    . heart bypass  2006   4 vessels  . KNEE ARTHROSCOPY  2001  . [place] SURGERY  1998  . TENDON RECONSTRUCTION Right 10/27/2013   Procedure: RIGHT OPEN TRICEPS REPAIR;  Surgeon: Renette Butters, MD;  Location: [facility];  Service: Orthopedics;  Laterality: Right;     Family History  Problem Relation Age of Onset  . Colon cancer Mother 5  . Colon cancer Father 73  . Stomach cancer Neg Hx      Social History: Social History   Social History  . Marital status: Married    Spouse name: N/A  . Number of children: N/A  . Years of education: N/A   Occupational History  . Not on file.   Social History Main Topics  . Smoking status: Never Smoker  . Smokeless tobacco: Never Used  . Alcohol use No  . Drug use: No  . Sexual activity: Not on file   Other Topics Concern  . Not on file   Social History Narrative  . No narrative on file    ROS: General: Feels poorly,  Has chills Eyes: no blurry vision, diplopia, or amaurosis ENT: no sore throat or hearing loss Resp: Mild chronic dyspnea. No cough, wheezing, or hemoptysis CV: no edema or palpitations GI: Has mild  abdominal pain and nausea, vomiting, No diarrhea. No bloody stools GU: no dysuria, frequency, or hematuria Skin: no rash Neuro: no headache, numbness, tingling, or weakness of extremities Musculoskeletal: no joint pain or swelling Heme: no bleeding, DVT, or easy bruising Endo: no polydipsia or polyuria    Physical Exam: Blood pressure 108/67, pulse 78, temperature 98.5  F (36.9 C), temperature source Oral, resp. rate 22, SpO2 100 %.   General appearance: alert, cooperative, appears stated age, no distress and appears to look mildly ill. Lungs: clear to auscultation bilaterally Heart: regular rate and rhythm, S1, S2 normal, no murmur, click, rub or gallop Abdomen: Mild diffuse tenderness and right hypogastric tenderness present Extremities: extremities normal, atraumatic, no cyanosis or edema Pulses: 2+ and symmetric Neurologic: Grossly normal  Labs:   Lab Results  Component Value Date   WBC 7.3 03/16/2016   HGB 15.6 03/16/2016   HCT 44.0 03/16/2016   MCV 93.6 03/16/2016   PLT 181 03/16/2016    Recent Labs Lab  03/16/16 1104 03/16/16 1521  NA 135  --   K 4.5  --   CL 103  --   CO2 23  --   BUN 23*  --   CREATININE 1.19  --   CALCIUM 9.1  --   PROT  --  6.4*  BILITOT  --  1.8*  ALKPHOS  --  43  ALT  --  19  AST  --  20  GLUCOSE 137*  --     Lipid Panel     Component Value Date/Time   CHOL 84 03/16/2016 1521   TRIG 67 03/16/2016 1521   HDL 32 (L) 03/16/2016 1521   CHOLHDL 2.6 03/16/2016 1521   VLDL 13 03/16/2016 1521   LDLCALC 39 03/16/2016 1521    Recent Labs  03/16/16 1521  TROPONINI <0.03    TSH No results for input(s): TSH in the last 8760 hours.  Radiology: Dg Chest Port 1 View  Result Date: 03/16/2016 CLINICAL DATA:  Chest pain and nausea, known coronary disease, remote bypass EXAM: PORTABLE CHEST 1 VIEW COMPARISON:  10/31/2013 FINDINGS: Previous coronary bypass changes noted. Stable heart size and vascularity. Lungs remain clear. No focal pneumonia, collapse or consolidation. Negative for edema, effusion or pneumothorax. Trachea is midline. Monitor leads overlie the chest. No acute osseous finding. IMPRESSION: Stable postoperative findings.  No superimposed acute chest process Electronically Signed   By: Jerilynn Mages.  Shick M.D.   On: 03/16/2016 11:08    Scheduled Meds: . artificial tears   Both Eyes QHS  . [START ON 03/17/2016] aspirin  325 mg Oral Daily  . docusate sodium  100 mg Oral BID  . iopamidol      . losartan  25 mg Oral Daily  . metoprolol tartrate  12.5 mg Oral BID  . pantoprazole  20 mg Oral Daily  . ranolazine  1,000 mg Oral BID  . rosuvastatin  20 mg Oral Daily  . testosterone  15 g Transdermal Daily   Continuous Infusions: . nitroGLYCERIN Stopped (03/16/16 1457)   PRN Meds:.acetaminophen, albuterol, gi cocktail, methocarbamol, morphine injection, ondansetron (ZOFRAN) IV, oxyCODONE-acetaminophen   EKG:  Normal sinus rhythm, LAD, LAFB, No ischemia. unchanged from previous tracings.  Office Exercise sestamibi stress test/ Lexiscan sestamibi stress test  08/10/2015: 1. Resting EKG, normal sinus rhythm, incomplete right bundle branch block. Stress EKG was nondiagnostic for ischemia due to inability to achieve target heart rate 73% MPHR). Patient exercised on Bruce protocol for 8 minutes and achieved 10.16 mets. There was no ST-T changes at peak exercise. Stress terminated due to fatigue. Stress converted to [facility] pharmacologic stress. There was no additional ST-T wave changes with from plastic stress protocol. Stress symptoms included dyspnea. 2. The perfusion imaging study demonstrates a very small sized very mild ischemia in the inferior wall. Left ventricular systolic function calculated QGS was  normal at 70%. This is a low risk study.  ASSESSMENT AND PLAN:  1. Atypical chest pain, No EKG abnormalities, negative cardiac markers in spite of ongoing chest pain for several hours. Patient also appears ill, was warm to touch,having significant nausea and abdominal discomfort. 2. Atherosclerosis of native coronary artery of native heart with angina pectoris. H/O CABG 1996; Cardiac cath 4/10: LIMA to LAD, Skip SVG to OM1 + D1 with ostial 4x15 non DES, has residual stenosis in the OM 1 limb of the skip graft. SVG to PD, Xience Stent RCA 3x18, 3.5x15 patent.  3. Mixed hyperlipidemia  Recommendation: I'm not very convinced that he has unstable angina pectoris  Would keep him nothing by mouth for tonight for possible coronary angiography if he continues to have chest discomfort or cardiac markers are positive.  He also has mild abdominal discomfort and he appears to be warm to touch.  White count is normal, decreased albumin  New,etiology not known.  We'll continue to follow along with family team.  Due to low blood pressure, I have advised them to hold off on IV nitroglycerin for now unless he has recurrence of chest pain or  Cardiac markers come positive.   [name], MD 03/16/2016, 5:25 PM [facility]. [place] Pager: [phone] Office:  [phone] If no answer Cell [phone]

## 2016-03-16 NOTE — Progress Notes (Signed)
md paged about diet .  Pt needs to eat something with medication. [place] pt stated unable to take percocet or morphine but has taken Tramadol before for pain .  Will continue to monitor. Saunders Revel T

## 2016-03-16 NOTE — ED Notes (Signed)
Cardiology at bedside.

## 2016-03-16 NOTE — ED Triage Notes (Signed)
Pt arrived by [facility] for chest pain that started this am. Had nausea and sob. deneis diaphoresis. Pt took asa 324mg  and 2 nitro at home.

## 2016-03-16 NOTE — ED Notes (Signed)
Pt transported to CT ?

## 2016-03-16 NOTE — ED Provider Notes (Signed)
Complains of left anterior chest pain, feels like prior "heart" pain onset 8 AM today. He treated himself with 2 sublingual nitroglycerin with partial relief. He was also treated with aspirin 324 mg in the field. Presently pain is 4 on a scale of 1-10. On exam he is alert appears mildly uncomfortable lungs clear auscultation heart regular rate and rhythm abdomen nondistended extremities without edema.  3:30 PM patient complains of diffuse right-sided abdominal pain. On reexamination he is tender at right upper and right lower quadrants. Negative Murphy sign. Dr. Nadyne Coombes evaluate patient in the ED and is in agreement the patient may need further evaluation of abdominal discomfort. Chest discomfort has resolved. CT scan of abdomen pelvis ordered   Orlie Dakin, MD 03/16/16 1700

## 2016-03-16 NOTE — ED Notes (Signed)
Called Dr. Kela Millin and was advised to hold  the nitro drip. Per Dr. order to give colace to help move his bowels. Also to sit pt. On side of the bed and assist with bed side commode.

## 2016-03-16 NOTE — H&P (Signed)
History and Physical    [name] [id number] DOB: 1945/06/15 DOA: 03/16/2016  PCP: Haywood Pao, MD Patient coming from: home  Chief Complaint: chest pain  HPI: [name] is a 71 y.o. male with medical history significant for CAD status post CABG 2006 status post stents 2010, remote TB, hypertension hyperlipidemia presents to emergency Department chief complaint chest pain. Patient being admitted to rule out.  Information is obtained from the patient and his wife is at the bedside. He was in his usual state of health until this morning he developed sudden onset left anterior chest "pressure". He reports he walked out to the newspaper and when he back to the house the pressure came on suddenly. He rated the pain a 10 out of 10. Associated symptoms include nausea without emesis and shortness of breath. He denies headache dizziness syncope or near-syncope. He denies diaphoresis abdominal pain lower extremity edema dysuria hematuria frequency or urgency. He states he took 324 mg aspirin and 2 nitroglycerin. He came to the emergency department and rated the pain a 2 out of 10. He does report that this feels different than the pain he had in the past. He also reports he had a cold about a month ago for which she was given an inhaler. His cough cleared up until 2 days ago gradually developed a nonproductive dry cough.    ED Course: In the emergency department he is afebrile hemodynamically stable mild tachypnea is not hypoxic. He is provided with further analgesia with no relief and a nitroglycerin drip is initiated  Review of Systems: As per HPI otherwise 10 point review of systems negative.   Ambulatory Status: He ambulates independentlyand falls  Past Medical History:  Diagnosis Date  . Arthritis   . Blood transfusion without reported diagnosis 2006   during open heart surgery  . CAD (coronary artery disease)   . GERD (gastroesophageal reflux disease)   . Hyperlipidemia     . Hypertension   . Tuberculosis 1956    Past Surgical History:  Procedure Laterality Date  . ANGIOPLASTY  2010   3 stents placed  . CATARACT EXTRACTION W/ INTRAOCULAR LENS  IMPLANT, BILATERAL  2012   bilateral  . COLONOSCOPY    . heart bypass  2006   4 vessels  . KNEE ARTHROSCOPY  2001  . [place] SURGERY  1998  . TENDON RECONSTRUCTION Right 10/27/2013   Procedure: RIGHT OPEN TRICEPS REPAIR;  Surgeon: Renette Butters, MD;  Location: [facility];  Service: Orthopedics;  Laterality: Right;    Social History   Social History  . Marital status: Married    Spouse name: N/A  . Number of children: N/A  . Years of education: N/A   Occupational History  . Not on file.   Social History Main Topics  . Smoking status: Never Smoker  . Smokeless tobacco: Never Used  . Alcohol use No  . Drug use: No  . Sexual activity: Not on file   Other Topics Concern  . Not on file   Social History Narrative  . No narrative on file   Lives at home with his wife. He is retired Allergies  Allergen Reactions  . Codeine Nausea And Vomiting  . Penicillins Rash    Tolerates Cefazolin without problems...[employer]    Family History  Problem Relation Age of Onset  . Colon cancer Mother 27  . Colon cancer Father 63  . Stomach cancer Neg Hx  Prior to Admission medications   Medication Sig Start Date End Date Taking? Authorizing Provider  aspirin 325 MG tablet Take 325 mg by mouth daily.   Yes Historical Provider, MD  Calcium Carbonate Antacid (TUMS ULTRA PO) Take 2 tablets by mouth daily as needed (acid).    Yes Historical Provider, MD  co-enzyme Q-10 30 MG capsule Take 100 mg by mouth 2 (two) times daily.    Yes Historical Provider, MD  CRESTOR 20 MG tablet Take 20 mg by mouth every evening.  12/04/11  Yes Historical Provider, MD  fish oil-omega-3 fatty acids 1000 MG capsule Take 1 g by mouth 2 (two) times daily.   Yes Historical Provider, MD  lansoprazole (PREVACID) 15 MG  capsule Take 15 mg by mouth 2 (two) times daily.   Yes Historical Provider, MD  losartan (COZAAR) 25 MG tablet Take 25 mg by mouth daily.   Yes Historical Provider, MD  NITROSTAT 0.4 MG SL tablet Place 0.4 mg under the tongue every 5 (five) minutes as needed for chest pain.  10/28/11  Yes Historical Provider, MD  acetaminophen (TYLENOL) 500 MG tablet Take 1,000 mg by mouth every morning.    Historical Provider, MD  docusate sodium (COLACE) 100 MG capsule Take 1 capsule (100 mg total) by mouth 2 (two) times daily. 10/27/13   Renette Butters, MD  furosemide (LASIX) 20 MG tablet  02/10/14   Historical Provider, MD  losartan (COZAAR) 100 MG tablet Take 50 mg by mouth daily. Takes 1/2 tablet daily    Historical Provider, MD  methocarbamol (ROBAXIN) 500 MG tablet Take 500 mg by mouth every 6 (six) hours as needed for muscle spasms.    Historical Provider, MD  metoprolol tartrate (LOPRESSOR) 25 MG tablet Take 12.5 mg by mouth 2 (two) times daily. Takes 1/2 tablet daily 12/19/11   Historical Provider, MD  Multiple Vitamin (MULTIVITAMIN) tablet Take 1 tablet by mouth daily.    Historical Provider, MD  Multiple Vitamins-Minerals (OCUVITE PRESERVISION PO) Take 1 tablet by mouth daily.    Historical Provider, MD  ondansetron (ZOFRAN) 4 MG tablet Take 1 tablet (4 mg total) by mouth every 8 (eight) hours as needed for nausea or vomiting. 10/27/13   Renette Butters, MD  oxyCODONE-acetaminophen (PERCOCET/ROXICET) 5-325 MG per tablet Take 2 tablets by mouth every 4 (four) hours as needed for severe pain.    Historical Provider, MD  Polyethyl Glycol-Propyl Glycol (SYSTANE OP) Apply 1-2 drops to eye daily as needed (dry eyes).     Historical Provider, MD  Polyethyl Glycol-Propyl Glycol (SYSTANE) 0.4-0.3 % GEL Apply 1-2 drops to eye daily as needed (dry eyes).     Historical Provider, MD  polyethylene glycol (MIRALAX / GLYCOLAX) packet Take 17 g by mouth daily as needed for moderate constipation. 10/31/13   Orpah Greek, MD  RANEXA 1000 MG SR tablet Take 1,000 mg by mouth 2 (two) times daily.  01/28/12   Historical Provider, MD  tadalafil (CIALIS) 20 MG tablet Take 20 mg by mouth daily as needed for erectile dysfunction.     Historical Provider, MD  vitamin C (ASCORBIC ACID) 500 MG tablet Take 500 mg by mouth daily.    Historical Provider, MD  ZETIA 10 MG tablet Take 5 mg by mouth daily. Takes 1/2 tablet daily 12/19/11   Historical Provider, MD    Physical Exam: Vitals:   03/16/16 1210 03/16/16 1215 03/16/16 1230 03/16/16 1245  BP: 133/75 132/72 132/75 108/72  Pulse: 84 85 94  87  Resp: 24 (!) 33 (!) 33 20  Temp:      TempSrc:      SpO2: 95% 99% 99% 99%     General:  Appears Slightly anxious and somewhat uncomfortable Eyes:  PERRL, EOMI, normal lids, iris ENT:  grossly normal hearing, lips & tongue, mucous membranes of his mouth are pink but dry Neck:  no LAD, masses or thyromegaly Cardiovascular:  RRR, no m/r/g. No LE edema. Chest non-tender to palpation Respiratory:  CTA bilaterally, no w/r/r. Normal respiratory effort. Dry cough during exam Abdomen:  soft, ntnd, obese positive bowel sounds no guarding or rebounding Skin:  no rash or induration seen on limited exam Musculoskeletal:  grossly normal tone BUE/BLE, good ROM, no bony abnormality Psychiatric:  grossly normal mood and affect, speech fluent and appropriate, AOx3 Neurologic:  CN 2-12 grossly intact, moves all extremities in coordinated fashion, sensation intact  Labs on Admission: I have personally reviewed following labs and imaging studies  CBC:  Recent Labs Lab 03/16/16 1104  WBC 7.3  HGB 15.6  HCT 44.0  MCV 93.6  PLT 0000000   Basic Metabolic Panel:  Recent Labs Lab 03/16/16 1104  NA 135  K 4.5  CL 103  CO2 23  GLUCOSE 137*  BUN 23*  CREATININE 1.19  CALCIUM 9.1   GFR: CrCl cannot be calculated (Unknown ideal weight.). Liver Function Tests: No results for input(s): AST, ALT, ALKPHOS, BILITOT, PROT,  ALBUMIN in the last 168 hours. No results for input(s): LIPASE, AMYLASE in the last 168 hours. No results for input(s): AMMONIA in the last 168 hours. Coagulation Profile: No results for input(s): INR, PROTIME in the last 168 hours. Cardiac Enzymes: No results for input(s): CKTOTAL, CKMB, CKMBINDEX, TROPONINI in the last 168 hours. BNP (last 3 results) No results for input(s): PROBNP in the last 8760 hours. HbA1C: No results for input(s): HGBA1C in the last 72 hours. CBG: No results for input(s): GLUCAP in the last 168 hours. Lipid Profile: No results for input(s): CHOL, HDL, LDLCALC, TRIG, CHOLHDL, LDLDIRECT in the last 72 hours. Thyroid Function Tests: No results for input(s): TSH, T4TOTAL, FREET4, T3FREE, THYROIDAB in the last 72 hours. Anemia Panel: No results for input(s): VITAMINB12, FOLATE, FERRITIN, TIBC, IRON, RETICCTPCT in the last 72 hours. Urine analysis: No results found for: COLORURINE, APPEARANCEUR, LABSPEC, PHURINE, GLUCOSEU, HGBUR, BILIRUBINUR, KETONESUR, PROTEINUR, UROBILINOGEN, NITRITE, LEUKOCYTESUR  Creatinine Clearance: CrCl cannot be calculated (Unknown ideal weight.).  Sepsis Labs: @LABRCNTIP (procalcitonin:4,lacticidven:4) )No results found for this or any previous visit (from the past 240 hour(s)).   Radiological Exams on Admission: Dg Chest Port 1 View  Result Date: 03/16/2016 CLINICAL DATA:  Chest pain and nausea, known coronary disease, remote bypass EXAM: PORTABLE CHEST 1 VIEW COMPARISON:  10/31/2013 FINDINGS: Previous coronary bypass changes noted. Stable heart size and vascularity. Lungs remain clear. No focal pneumonia, collapse or consolidation. Negative for edema, effusion or pneumothorax. Trachea is midline. Monitor leads overlie the chest. No acute osseous finding. IMPRESSION: Stable postoperative findings.  No superimposed acute chest process Electronically Signed   By: Jerilynn Mages.  Shick M.D.   On: 03/16/2016 11:08    EKG: Independently reviewed. Sinus  rhythm Left anterior fascicular block Abnormal R-wave progression, late transition  Assessment/Plan Principal Problem:   Chest pain Active Problems:   Hypertension   Hyperlipidemia   CAD (coronary artery disease)   Constipation   #1. Chest pain. Heart score 5. Some typical features. Risk factors include hypertension, hyperlipidemia, family history, CAD. Initial troponin negative. EKG without  acute changes. Chest x-ray no acute chest process. Pain persisted in the emergency department rated 2 out of 10. Nitroglycerin drip initiated. Dr. Einar Gip notified and indicated he would see the patient in consultation -Admit to telemetry -Cycle troponin -Serial EKG -When necessary Zofran -GI cocktail -When necessary morphine -Continue aspirin -Continue nitroglycerin drip -Continue statin -Lipid panel  #2. Hypertension. Fair control in the emergency department. Home medications include losartan, metoprolol -Continue home medications with parameters -Monitor closely  #3. Hyperlipidemia. -Obtain lipid panel -Continue Crestor  #4. CAD. Status post CABG 2006 status post stent 2010. Patient reports most recent stress test 6 months ago and reports results were "okay" -See above -Await Dr.Ganji recommendation  #5. Constipation. Patient reports been 2 days since his last bowel movement. Home medications include Colace twice a day. -continue stool softner -consider MOM    DVT prophylaxis: lovenox  Code Status: full  Family Communication: wife and daughter at bedside  Disposition Plan: home  Consults called: ganji  Admission status: obs    Radene Gunning MD [facility]  If 7PM-7AM, please contact night-coverage [email or web address] Password [email or web address]  03/16/2016, 1:13 PM

## 2016-03-17 ENCOUNTER — Observation Stay (HOSPITAL_COMMUNITY): Payer: PPO

## 2016-03-17 DIAGNOSIS — K5900 Constipation, unspecified: Secondary | ICD-10-CM | POA: Diagnosis not present

## 2016-03-17 DIAGNOSIS — Z955 Presence of coronary angioplasty implant and graft: Secondary | ICD-10-CM | POA: Diagnosis not present

## 2016-03-17 DIAGNOSIS — Z951 Presence of aortocoronary bypass graft: Secondary | ICD-10-CM | POA: Diagnosis not present

## 2016-03-17 DIAGNOSIS — E782 Mixed hyperlipidemia: Secondary | ICD-10-CM | POA: Diagnosis not present

## 2016-03-17 DIAGNOSIS — R079 Chest pain, unspecified: Secondary | ICD-10-CM

## 2016-03-17 DIAGNOSIS — R0789 Other chest pain: Secondary | ICD-10-CM | POA: Diagnosis not present

## 2016-03-17 DIAGNOSIS — R0602 Shortness of breath: Secondary | ICD-10-CM | POA: Diagnosis not present

## 2016-03-17 DIAGNOSIS — R1011 Right upper quadrant pain: Secondary | ICD-10-CM | POA: Diagnosis not present

## 2016-03-17 DIAGNOSIS — I10 Essential (primary) hypertension: Secondary | ICD-10-CM | POA: Diagnosis not present

## 2016-03-17 DIAGNOSIS — Z8611 Personal history of tuberculosis: Secondary | ICD-10-CM | POA: Diagnosis not present

## 2016-03-17 DIAGNOSIS — I2510 Atherosclerotic heart disease of native coronary artery without angina pectoris: Secondary | ICD-10-CM | POA: Diagnosis not present

## 2016-03-17 DIAGNOSIS — Z7982 Long term (current) use of aspirin: Secondary | ICD-10-CM | POA: Diagnosis not present

## 2016-03-17 DIAGNOSIS — K219 Gastro-esophageal reflux disease without esophagitis: Secondary | ICD-10-CM | POA: Diagnosis not present

## 2016-03-17 LAB — COMPREHENSIVE METABOLIC PANEL
ALK PHOS: 40 U/L (ref 38–126)
ALT: 17 U/L (ref 17–63)
AST: 22 U/L (ref 15–41)
Albumin: 3.1 g/dL — ABNORMAL LOW (ref 3.5–5.0)
Anion gap: 5 (ref 5–15)
BUN: 15 mg/dL (ref 6–20)
CHLORIDE: 103 mmol/L (ref 101–111)
CO2: 25 mmol/L (ref 22–32)
CREATININE: 1.13 mg/dL (ref 0.61–1.24)
Calcium: 8.2 mg/dL — ABNORMAL LOW (ref 8.9–10.3)
GFR calc Af Amer: >60 mL/min (ref >60)
Glucose, Bld: 117 mg/dL — ABNORMAL HIGH (ref 65–99)
Potassium: 3.8 mmol/L (ref 3.5–5.1)
Sodium: 133 mmol/L — ABNORMAL LOW (ref 135–145)
Total Bilirubin: 1.4 mg/dL — ABNORMAL HIGH (ref 0.3–1.2)
Total Protein: 6 g/dL — ABNORMAL LOW (ref 6.5–8.1)

## 2016-03-17 MED ORDER — PANTOPRAZOLE SODIUM 40 MG PO TBEC: 40.0000 mg | DELAYED_RELEASE_TABLET | Freq: Every day | ORAL | 1 refills | Status: AC

## 2016-03-17 MED ORDER — POLYETHYLENE GLYCOL 3350 17 G PO PACK: 17.0000 g | PACK | Freq: Every day | ORAL | 0 refills | Status: DC

## 2016-03-17 NOTE — Discharge Summary (Signed)
[name], is a 71 y.o. male  DOB [birth year]  MRN [id number].  Admission date:  03/16/2016  Admitting Physician  Elwin Mocha, MD  Discharge Date:  03/17/2016   Primary MD  Haywood Pao, MD  Recommendations for primary care physician for things to follow:  - patient to folow with dr Einar Gip as outpatient, to have Echo done as outpatient   Admission Diagnosis  Essential hypertension [I10] Other hyperlipidemia [E78.4] Chest pain, unspecified type [R07.9]   Discharge Diagnosis  Essential hypertension [I10] Other hyperlipidemia [E78.4] Chest pain, unspecified type [R07.9]   Principal Problem:   Chest pain Active Problems:   Hypertension   Hyperlipidemia   CAD (coronary artery disease)   Constipation      Past Medical History:  Diagnosis Date  . Anginal pain ([place])   . Arthritis   . Blood transfusion without reported diagnosis 2006   during open heart surgery  . CAD (coronary artery disease)   . GERD (gastroesophageal reflux disease)   . Hyperlipidemia   . Hypertension   . Pneumonia   . Tuberculosis 1956    Past Surgical History:  Procedure Laterality Date  . ANGIOPLASTY  2010   3 stents placed  . BACK SURGERY    . CATARACT EXTRACTION W/ INTRAOCULAR LENS  IMPLANT, BILATERAL  2012   bilateral  . COLONOSCOPY    . CORONARY ARTERY BYPASS GRAFT    . heart bypass  2006   4 vessels  . KNEE ARTHROSCOPY  2001  . [place] SURGERY  1998  . TENDON RECONSTRUCTION Right 10/27/2013   Procedure: RIGHT OPEN TRICEPS REPAIR;  Surgeon: Renette Butters, MD;  Location: [facility];  Service: Orthopedics;  Laterality: Right;       History of present illness and  Hospital Course:     Kindly see H&P for history of present illness and admission details, please review complete Labs, Consult reports and Test reports for all details in brief  HPI  from the history and  physical done on the day of admission 03/16/2016 HPI: [name] is a 71 y.o. male with medical history significant for CAD status post CABG 2006 status post stents 2010, remote TB, hypertension hyperlipidemia presents to emergency Department chief complaint chest pain. Patient being admitted to rule out.  Information is obtained from the patient and his wife is at the bedside. He was in his usual state of health until this morning he developed sudden onset left anterior chest "pressure". He reports he walked out to the newspaper and when he back to the house the pressure came on suddenly. He rated the pain a 10 out of 10. Associated symptoms include nausea without emesis and shortness of breath. He denies headache dizziness syncope or near-syncope. He denies diaphoresis abdominal pain lower extremity edema dysuria hematuria frequency or urgency. He states he took 324 mg aspirin and 2 nitroglycerin. He came to the emergency department and rated the pain a 2 out of 10. He does report that  this feels different than the pain he had in the past. He also reports he had a cold about a month ago for which she was given an inhaler. His cough cleared up until 2 days ago gradually developed a nonproductive dry cough.    ED Course: In the emergency department he is afebrile hemodynamically stable mild tachypnea is not hypoxic. He is provided with further analgesia with no relief and a nitroglycerin drip is initiated   Hospital Course    Chest pain. -Cardiology input appreciated , per cards Atypical chest pain, easily reproducible and not NTG responsive. No EKG abnormalities, negative cardiac markers in spite of ongoing chest pain for several hours. - Follow with Dr. Einar Gip as outpatient, echo can be done in outpatient settings  Abdominal pain - No acute finding in CT abdomen pelvis, as well no acute finding in the right upper quadrant ultrasound, LFTs within normal limits, abdominal exam is benign  today, most likely related to gastritis, or constipation. We'll change his PPI to Protonix 40 mg twice a day, and will start him on laxatives  Hypertension.  - Continue home medication  Hyperlipidemia. -Continue Crestor  CAD.  - Continue with home medication       Discharge Condition: stable  Follow UP  Follow-up Information    Haywood Pao, MD Follow up in 1 week(s).   Specialty:  Internal Medicine Contact information: [place] [address] [phone]        [name], MD Follow up in 2 week(s).   Specialty:  Cardiology Contact information: [address] [place] [address] [phone]             Discharge Instructions  and  Discharge Medications     Discharge Instructions    Discharge instructions    Complete by:  As directed    Follow with Primary MD Haywood Pao, MD in 7 days   Get CBC, CMP, checked  by Primary MD next visit.    Activity: As tolerated with Full fall precautions use walker/cane & assistance as needed   Disposition Home    Diet: Heart Healthy  , with feeding assistance and aspiration precautions.  For Heart failure patients - Check your Weight same time everyday, if you gain over 2 pounds, or you develop in leg swelling, experience more shortness of breath or chest pain, call your Primary MD immediately. Follow Cardiac Low Salt Diet and 1.5 lit/day fluid restriction.   On your next visit with your primary care physician please Get Medicines reviewed and adjusted.   Please request your Prim.MD to go over all Hospital Tests and Procedure/Radiological results at the follow up, please get all Hospital records sent to your Prim MD by signing hospital release before you go home.   If you experience worsening of your admission symptoms, develop shortness of breath, life threatening emergency, suicidal or homicidal thoughts you must seek medical attention immediately by calling 911  or calling your MD immediately  if symptoms less severe.  You Must read complete instructions/literature along with all the possible adverse reactions/side effects for all the Medicines you take and that have been prescribed to you. Take any new Medicines after you have completely understood and accpet all the possible adverse reactions/side effects.   Do not drive, operating heavy machinery, perform activities at heights, swimming or participation in water activities or provide baby sitting services if your were admitted for syncope or siezures until you have seen by Primary  MD or a Neurologist and advised to do so again.  Do not drive when taking Pain medications.    Do not take more than prescribed Pain, Sleep and Anxiety Medications  Special Instructions: If you have smoked or chewed Tobacco  in the last 2 yrs please stop smoking, stop any regular Alcohol  and or any Recreational drug use.  Wear Seat belts while driving.   Please note  You were cared for by a hospitalist during your hospital stay. If you have any questions about your discharge medications or the care you received while you were in the hospital after you are discharged, you can call the unit and asked to speak with the hospitalist on call if the hospitalist that took care of you is not available. Once you are discharged, your primary care physician will handle any further medical issues. Please note that NO REFILLS for any discharge medications will be authorized once you are discharged, as it is imperative that you return to your primary care physician (or establish a relationship with a primary care physician if you do not have one) for your aftercare needs so that they can reassess your need for medications and monitor your lab values.   Increase activity slowly    Complete by:  As directed      Allergies as of 03/17/2016      Reactions   Codeine Nausea And Vomiting   Penicillins Rash   Tolerates Cefazolin without  problems...[employer]      Medication List    STOP taking these medications   lansoprazole 15 MG capsule Commonly known as:  PREVACID     TAKE these medications   acetaminophen 500 MG tablet Commonly known as:  TYLENOL Take 1,000 mg by mouth every morning.   ANDROGEL PUMP 20.25 MG/ACT (1.62%) Gel Generic drug:  Testosterone Apply 3 sprays topically every morning.   aspirin 325 MG tablet Take 325 mg by mouth every evening.   CoQ10 100 MG Caps Take 100 mg by mouth 2 (two) times daily.   CRESTOR 20 MG tablet Generic drug:  rosuvastatin Take 20 mg by mouth every evening.   fish oil-omega-3 fatty acids 1000 MG capsule Take 1 g by mouth 2 (two) times daily.   losartan 25 MG tablet Commonly known as:  COZAAR Take 25 mg by mouth daily.   metoprolol tartrate 25 MG tablet Commonly known as:  LOPRESSOR Take 12.5 mg by mouth 2 (two) times daily.   multivitamin tablet Take 1 tablet by mouth daily.   NITROSTAT 0.4 MG SL tablet Generic drug:  nitroGLYCERIN Place 0.4 mg under the tongue every 5 (five) minutes as needed for chest pain.   pantoprazole 40 MG tablet Commonly known as:  PROTONIX Take 1 tablet (40 mg total) by mouth daily.   polyethylene glycol packet Commonly known as:  MIRALAX Take 17 g by mouth daily.   PRESERVISION AREDS 2 Caps Take 1 capsule by mouth 2 (two) times daily.   PROAIR HFA 108 (90 Base) MCG/ACT inhaler Generic drug:  albuterol Inhale 2 puffs into the lungs daily as needed for shortness of breath.   RANEXA 1000 MG SR tablet Generic drug:  ranolazine Take 1,000 mg by mouth 2 (two) times daily.   SYSTANE 0.4-0.3 % Gel ophthalmic gel Generic drug:  Polyethyl Glycol-Propyl Glycol Apply 1-2 drops to eye daily as needed (dry eyes).   SYSTANE 0.4-0.3 % Gel ophthalmic gel Generic drug:  Polyethyl Glycol-Propyl Glycol Place 1 application into both eyes at  bedtime.   tadalafil 20 MG tablet Commonly known as:  CIALIS Take 20 mg by mouth daily as  needed for erectile dysfunction.   TUMS ULTRA PO Take 2 tablets by mouth daily as needed (acid).   vitamin C 500 MG tablet Commonly known as:  ASCORBIC ACID Take 500 mg by mouth daily.   ZETIA 10 MG tablet Generic drug:  ezetimibe Take 5 mg by mouth every evening. Takes 1/2 tablet daily         Diet and Activity recommendation: See Discharge Instructions above   Consults obtained -  Cardiology Dr Einar Gip   Major procedures and Radiology Reports - PLEASE review detailed and final reports for all details, in brief -     Ct Abdomen Pelvis W Contrast  Result Date: 03/16/2016 CLINICAL DATA:  Acute onset of right lower quadrant abdominal pain, nausea and vomiting. Initial encounter. EXAM: CT ABDOMEN AND PELVIS WITH CONTRAST TECHNIQUE: Multidetector CT imaging of the abdomen and pelvis was performed using the standard protocol following bolus administration of intravenous contrast. CONTRAST:  178mL ISOVUE-300 IOPAMIDOL (ISOVUE-300) INJECTION 61% COMPARISON:  CT of the abdomen and pelvis performed 10/31/2013 FINDINGS: Lower chest: Minimal bibasilar atelectasis is noted. Diffuse coronary artery calcifications are seen. The patient is status post median sternotomy. Hepatobiliary: The liver is unremarkable in appearance. The gallbladder is unremarkable in appearance. The common bile duct remains normal in caliber. Pancreas: The pancreas is within normal limits. Spleen: The spleen is unremarkable in appearance. Adrenals/Urinary Tract: The adrenal glands are unremarkable in appearance. The kidneys are within normal limits. There is no evidence of hydronephrosis. No renal or ureteral stones are identified. Nonspecific perinephric stranding is noted bilaterally. There is diffuse distention of the left ureter, without evidence of distal obstruction. This is of uncertain significance. Stomach/Bowel: The stomach is unremarkable in appearance. The small bowel is within normal limits. The appendix is normal  in caliber, without evidence of appendicitis. The colon is unremarkable in appearance. Vascular/Lymphatic: Scattered calcification is seen along the abdominal aorta and its branches. The abdominal aorta is otherwise grossly unremarkable. The inferior vena cava is grossly unremarkable. No retroperitoneal lymphadenopathy is seen. No pelvic sidewall lymphadenopathy is identified. Reproductive: The bladder is moderately distended and grossly unremarkable. The prostate is borderline prominent, measuring 4.9 cm in transverse dimension, with minimal calcification. Other: No additional soft tissue abnormalities are seen. Musculoskeletal: No acute osseous abnormalities are identified. The visualized musculature is unremarkable in appearance. IMPRESSION: 1. No acute abnormality seen to explain the patient's symptoms. 2. Scattered aortic atherosclerosis. 3. Diffuse coronary artery calcifications seen. 4. Borderline prominent prostate. Electronically Signed   By: Garald Balding M.D.   On: 03/16/2016 19:10   Dg Chest Port 1 View  Result Date: 03/16/2016 CLINICAL DATA:  Chest pain and nausea, known coronary disease, remote bypass EXAM: PORTABLE CHEST 1 VIEW COMPARISON:  10/31/2013 FINDINGS: Previous coronary bypass changes noted. Stable heart size and vascularity. Lungs remain clear. No focal pneumonia, collapse or consolidation. Negative for edema, effusion or pneumothorax. Trachea is midline. Monitor leads overlie the chest. No acute osseous finding. IMPRESSION: Stable postoperative findings.  No superimposed acute chest process Electronically Signed   By: Jerilynn Mages.  Shick M.D.   On: 03/16/2016 11:08   US Abdomen Limited Ruq  Result Date: 03/17/2016 CLINICAL DATA:  Chronic right upper quadrant abdominal pain, nausea. EXAM: US ABDOMEN LIMITED - RIGHT UPPER QUADRANT COMPARISON:  None. FINDINGS: Gallbladder: No gallstones or wall thickening visualized. No sonographic Murphy sign noted by sonographer. Common bile duct:  Diameter: 6.2  mm which is within normal limits. Liver: No focal lesion identified. Within normal limits in parenchymal echogenicity. IMPRESSION: No abnormality seen in the right upper quadrant of the abdomen. Electronically Signed   By: Marijo Conception, M.D.   On: 03/17/2016 10:43    Micro Results    Recent Results (from the past 240 hour(s))  MRSA PCR Screening     Status: None   Collection Time: 03/16/16  8:45 PM  Result Value Ref Range Status   MRSA by PCR NEGATIVE NEGATIVE Final    Comment:        The GeneXpert MRSA Assay (FDA approved for NASAL specimens only), is one component of a comprehensive MRSA colonization surveillance program. It is not intended to diagnose MRSA infection nor to guide or monitor treatment for MRSA infections.        Today   Subjective:   Christepher Fronheiser today has no headache,Further chest pain today, hurts epigastric pain significantly subsided  rated lunch with no problems .  Objective:   Blood pressure 105/68, pulse 61, temperature 98.1 F (36.7 C), temperature source Oral, resp. rate 17, height 5\' 8"  (1.727 m), weight 91.1 kg (200 lb 12.8 oz), SpO2 98 %.   Intake/Output Summary (Last 24 hours) at 03/17/16 1412 Last data filed at 03/17/16 1200  Gross per 24 hour  Intake              240 ml  Output              975 ml  Net             -735 ml    Exam Awake Alert, Oriented x 3, No new F.N deficits, Normal affect [place].AT,PERRAL Supple Neck,No JVD, No cervical lymphadenopathy appriciated.  Symmetrical Chest wall movement, Good air movement bilaterally, CTAB RRR,No Gallops,Rubs or new Murmurs, No Parasternal Heave +ve B.Sounds, Abd Soft, Non tender, No organomegaly appriciated, No rebound -guarding or rigidity. No Cyanosis, Clubbing or edema, No new Rash or bruise  Data Review   CBC w Diff:  Lab Results  Component Value Date   WBC 7.3 03/16/2016   HGB 15.6 03/16/2016   HCT 44.0 03/16/2016   PLT 181 03/16/2016   LYMPHOPCT 31 10/31/2013    MONOPCT 9 10/31/2013   EOSPCT 3 10/31/2013   BASOPCT 0 10/31/2013    CMP:  Lab Results  Component Value Date   NA 133 (L) 03/17/2016   K 3.8 03/17/2016   CL 103 03/17/2016   CO2 25 03/17/2016   BUN 15 03/17/2016   CREATININE 1.13 03/17/2016   PROT 6.0 (L) 03/17/2016   ALBUMIN 3.1 (L) 03/17/2016   BILITOT 1.4 (H) 03/17/2016   ALKPHOS 40 03/17/2016   AST 22 03/17/2016   ALT 17 03/17/2016  .   Total Time in preparing paper work, data evaluation and todays exam - 35 minutes  Kristelle Cavallaro M.D on 03/17/2016 at 2:12 PM  [facility]   Office  [phone]

## 2016-03-17 NOTE — Care Management Obs Status (Signed)
[facility] NOTIFICATION   Patient Details  Name: [name] MRN: [id number] Date of Birth: 03/21/1945   Medicare Observation Status Notification Given:  Yes    Lacretia Leigh, RN 03/17/2016, 11:07 AM

## 2016-03-17 NOTE — Discharge Instructions (Signed)
Follow with Primary MD Haywood Pao, MD in 7 days   Get CBC, CMP, checked  by Primary MD next visit.    Activity: As tolerated with Full fall precautions use walker/cane & assistance as needed   Disposition Home    Diet: Heart Healthy  , with feeding assistance and aspiration precautions.  For Heart failure patients - Check your Weight same time everyday, if you gain over 2 pounds, or you develop in leg swelling, experience more shortness of breath or chest pain, call your Primary MD immediately. Follow Cardiac Low Salt Diet and 1.5 lit/day fluid restriction.   On your next visit with your primary care physician please Get Medicines reviewed and adjusted.   Please request your Prim.MD to go over all Hospital Tests and Procedure/Radiological results at the follow up, please get all Hospital records sent to your Prim MD by signing hospital release before you go home.   If you experience worsening of your admission symptoms, develop shortness of breath, life threatening emergency, suicidal or homicidal thoughts you must seek medical attention immediately by calling 911 or calling your MD immediately  if symptoms less severe.  You Must read complete instructions/literature along with all the possible adverse reactions/side effects for all the Medicines you take and that have been prescribed to you. Take any new Medicines after you have completely understood and accpet all the possible adverse reactions/side effects.   Do not drive, operating heavy machinery, perform activities at heights, swimming or participation in water activities or provide baby sitting services if your were admitted for syncope or siezures until you have seen by Primary MD or a Neurologist and advised to do so again.  Do not drive when taking Pain medications.    Do not take more than prescribed Pain, Sleep and Anxiety Medications  Special Instructions: If you have smoked or chewed Tobacco  in the last 2 yrs  please stop smoking, stop any regular Alcohol  and or any Recreational drug use.  Wear Seat belts while driving.   Please note  You were cared for by a hospitalist during your hospital stay. If you have any questions about your discharge medications or the care you received while you were in the hospital after you are discharged, you can call the unit and asked to speak with the hospitalist on call if the hospitalist that took care of you is not available. Once you are discharged, your primary care physician will handle any further medical issues. Please note that NO REFILLS for any discharge medications will be authorized once you are discharged, as it is imperative that you return to your primary care physician (or establish a relationship with a primary care physician if you do not have one) for your aftercare needs so that they can reassess your need for medications and monitor your lab values.

## 2016-03-17 NOTE — Progress Notes (Signed)
Subjective:  Still nauseous. Feels better and thinks appetite coming back. Still has diffuse abdominal discomfort and chest pain.  Objective:  Vital Signs in the last 24 hours: Temp:  [97.5 F (36.4 C)-98.5 F (36.9 C)] 98.5 F (36.9 C) (01/08 0850) Pulse Rate:  [61-100] 62 (01/08 0850) Resp:  [14-33] 17 (01/08 0850) BP: (98-137)/(62-84) 123/67 (01/08 0850) SpO2:  [95 %-100 %] 99 % (01/08 0850) Weight:  [91.1 kg (200 lb 12.8 oz)] 91.1 kg (200 lb 12.8 oz) (01/07 2107)  Intake/Output from previous day: 01/07 0701 - 01/08 0700 In: 120 [P.O.:120] Out: 975 [Urine:975]  Physical Exam:  General appearance: alert, cooperative, appears stated age and no distress Eyes: negative findings: lids and lashes normal Neck: no adenopathy, no carotid bruit, no JVD, supple, symmetrical, trachea midline and thyroid not enlarged, symmetric, no tenderness/mass/nodules Neck: JVP - normal, carotids 2+= without bruits Resp: clear to auscultation bilaterally Chest wall: mild sternal tenderness Cardio: regular rate and rhythm, S1, S2 normal, no murmur, click, rub or gallop GI: soft, non-tender; bowel sounds normal; no masses,  no organomegaly Extremities: extremities normal, atraumatic, no cyanosis or edema    Lab Results: BMP  Recent Labs  03/16/16 1104  NA 135  K 4.5  CL 103  CO2 23  GLUCOSE 137*  BUN 23*  CREATININE 1.19  CALCIUM 9.1  GFRNONAA 60*  GFRAA >60    CBC  Recent Labs Lab 03/16/16 1104  WBC 7.3  RBC 4.70  HGB 15.6  HCT 44.0  PLT 181  MCV 93.6  MCH 33.2  MCHC 35.5  RDW 13.0    HEMOGLOBIN A1C No results found for: HGBA1C, MPG  Cardiac Panel (last 3 results)  Recent Labs  03/16/16 1521 03/16/16 1949  TROPONINI <0.03 <0.03    BNP (last 3 results) No results for input(s): PROBNP in the last 8760 hours.  TSH No results for input(s): TSH in the last 8760 hours.  CHOLESTEROL  Recent Labs  03/16/16 1521  CHOL 84    Hepatic Function Panel  Recent  Labs  03/16/16 1521  PROT 6.4*  ALBUMIN 3.2*  AST 20  ALT 19  ALKPHOS 43  BILITOT 1.8*  BILIDIR 0.3  IBILI 1.5*    Imaging: Imaging results have been reviewed  Cardiac Studies:  EKG: NSR, LAD, LAFB, No ischemia. No change from previous..   Assessment/Plan:  1. Atypical chest pain, easily reproducible and not NTG responsive. No EKG abnormalities, negative cardiac markers in spite of ongoing chest pain for several hours. Patient also appears ill, was warm to touch,having significant nausea and abdominal discomfort. 2. Atherosclerosis of native coronary artery of native heart with angina pectoris. H/O CABG 1996; Cardiac cath 4/10: LIMA to LAD, Skip SVG to OM1 + D1 with ostial 4x15 non DES, has residual stenosis in the OM 1 limb of the skip graft. SVG to PD, Xience Stent RCA 3x18, 3.5x15 patent.  3. Mixed hyperlipidemia 4. Non specific Acute gastritis  Recommendation: I do not think his presentation is consistent with ACS. I have ordered an echocardiogram however this is not necessary from my standpoint for cardiac evaluation and he can be discharged home as he feels better and his appetite is improving and if he tolerates her breakfast as there is nothing specific that he can find by CT scan and by physical exam, I suspect nonspecific acute gastritis. He is advised to follow-up with his PCP if symptoms persist. I'll be happy to continue to see him, patient is aware that he  can easily get in touch with me at any point. I have known him for many years.   [name], M.D. 03/17/2016, 9:38 AM [facility] Pager: [phone] Office: [phone] If no answer: [phone]

## 2016-03-21 DIAGNOSIS — H18413 Arcus senilis, bilateral: Secondary | ICD-10-CM | POA: Diagnosis not present

## 2016-03-21 DIAGNOSIS — H5340 Unspecified visual field defects: Secondary | ICD-10-CM | POA: Diagnosis not present

## 2016-03-21 DIAGNOSIS — H40023 Open angle with borderline findings, high risk, bilateral: Secondary | ICD-10-CM | POA: Diagnosis not present

## 2016-03-21 DIAGNOSIS — H11423 Conjunctival edema, bilateral: Secondary | ICD-10-CM | POA: Diagnosis not present

## 2016-03-21 DIAGNOSIS — Z961 Presence of intraocular lens: Secondary | ICD-10-CM | POA: Diagnosis not present

## 2016-03-21 DIAGNOSIS — H11153 Pinguecula, bilateral: Secondary | ICD-10-CM | POA: Diagnosis not present

## 2016-03-21 DIAGNOSIS — Z9849 Cataract extraction status, unspecified eye: Secondary | ICD-10-CM | POA: Diagnosis not present

## 2016-03-21 DIAGNOSIS — H353131 Nonexudative age-related macular degeneration, bilateral, early dry stage: Secondary | ICD-10-CM | POA: Diagnosis not present

## 2016-03-26 DIAGNOSIS — I10 Essential (primary) hypertension: Secondary | ICD-10-CM | POA: Diagnosis not present

## 2016-03-26 DIAGNOSIS — E7800 Pure hypercholesterolemia, unspecified: Secondary | ICD-10-CM | POA: Diagnosis not present

## 2016-03-26 DIAGNOSIS — Z125 Encounter for screening for malignant neoplasm of prostate: Secondary | ICD-10-CM | POA: Diagnosis not present

## 2016-03-26 DIAGNOSIS — R7302 Impaired glucose tolerance (oral): Secondary | ICD-10-CM | POA: Diagnosis not present

## 2016-04-02 DIAGNOSIS — E291 Testicular hypofunction: Secondary | ICD-10-CM | POA: Diagnosis not present

## 2016-04-02 DIAGNOSIS — D692 Other nonthrombocytopenic purpura: Secondary | ICD-10-CM | POA: Diagnosis not present

## 2016-04-02 DIAGNOSIS — N183 Chronic kidney disease, stage 3 (moderate): Secondary | ICD-10-CM | POA: Diagnosis not present

## 2016-04-02 DIAGNOSIS — R7302 Impaired glucose tolerance (oral): Secondary | ICD-10-CM | POA: Diagnosis not present

## 2016-04-02 DIAGNOSIS — E668 Other obesity: Secondary | ICD-10-CM | POA: Diagnosis not present

## 2016-04-02 DIAGNOSIS — I130 Hypertensive heart and chronic kidney disease with heart failure and stage 1 through stage 4 chronic kidney disease, or unspecified chronic kidney disease: Secondary | ICD-10-CM | POA: Diagnosis not present

## 2016-04-02 DIAGNOSIS — I519 Heart disease, unspecified: Secondary | ICD-10-CM | POA: Diagnosis not present

## 2016-04-02 DIAGNOSIS — Z1389 Encounter for screening for other disorder: Secondary | ICD-10-CM | POA: Diagnosis not present

## 2016-04-02 DIAGNOSIS — Z0000 Encounter for general adult medical examination without abnormal findings: Secondary | ICD-10-CM | POA: Diagnosis not present

## 2016-04-02 DIAGNOSIS — I209 Angina pectoris, unspecified: Secondary | ICD-10-CM | POA: Diagnosis not present

## 2016-04-02 DIAGNOSIS — Z6831 Body mass index (BMI) 31.0-31.9, adult: Secondary | ICD-10-CM | POA: Diagnosis not present

## 2016-04-08 DIAGNOSIS — R0989 Other specified symptoms and signs involving the circulatory and respiratory systems: Secondary | ICD-10-CM | POA: Diagnosis not present

## 2016-04-08 DIAGNOSIS — R079 Chest pain, unspecified: Secondary | ICD-10-CM | POA: Diagnosis not present

## 2016-04-15 DIAGNOSIS — Z1212 Encounter for screening for malignant neoplasm of rectum: Secondary | ICD-10-CM | POA: Diagnosis not present

## 2016-04-18 DIAGNOSIS — M7041 Prepatellar bursitis, right knee: Secondary | ICD-10-CM | POA: Diagnosis not present

## 2016-04-23 DIAGNOSIS — M7041 Prepatellar bursitis, right knee: Secondary | ICD-10-CM | POA: Diagnosis not present

## 2016-05-07 DIAGNOSIS — M7041 Prepatellar bursitis, right knee: Secondary | ICD-10-CM | POA: Diagnosis not present

## 2016-08-20 DIAGNOSIS — I25119 Atherosclerotic heart disease of native coronary artery with unspecified angina pectoris: Secondary | ICD-10-CM | POA: Diagnosis not present

## 2016-08-20 DIAGNOSIS — I209 Angina pectoris, unspecified: Secondary | ICD-10-CM | POA: Diagnosis not present

## 2016-08-20 DIAGNOSIS — I951 Orthostatic hypotension: Secondary | ICD-10-CM | POA: Diagnosis not present

## 2016-08-20 DIAGNOSIS — R0609 Other forms of dyspnea: Secondary | ICD-10-CM | POA: Diagnosis not present

## 2016-09-19 DIAGNOSIS — Z9841 Cataract extraction status, right eye: Secondary | ICD-10-CM | POA: Diagnosis not present

## 2016-09-19 DIAGNOSIS — H1712 Central corneal opacity, left eye: Secondary | ICD-10-CM | POA: Diagnosis not present

## 2016-09-19 DIAGNOSIS — H11423 Conjunctival edema, bilateral: Secondary | ICD-10-CM | POA: Diagnosis not present

## 2016-09-19 DIAGNOSIS — H04123 Dry eye syndrome of bilateral lacrimal glands: Secondary | ICD-10-CM | POA: Diagnosis not present

## 2016-09-19 DIAGNOSIS — H11153 Pinguecula, bilateral: Secondary | ICD-10-CM | POA: Diagnosis not present

## 2016-09-19 DIAGNOSIS — Z961 Presence of intraocular lens: Secondary | ICD-10-CM | POA: Diagnosis not present

## 2016-09-19 DIAGNOSIS — H353131 Nonexudative age-related macular degeneration, bilateral, early dry stage: Secondary | ICD-10-CM | POA: Diagnosis not present

## 2016-09-19 DIAGNOSIS — H02403 Unspecified ptosis of bilateral eyelids: Secondary | ICD-10-CM | POA: Diagnosis not present

## 2016-09-19 DIAGNOSIS — H40023 Open angle with borderline findings, high risk, bilateral: Secondary | ICD-10-CM | POA: Diagnosis not present

## 2016-09-19 DIAGNOSIS — H35363 Drusen (degenerative) of macula, bilateral: Secondary | ICD-10-CM | POA: Diagnosis not present

## 2016-09-19 DIAGNOSIS — H5340 Unspecified visual field defects: Secondary | ICD-10-CM | POA: Diagnosis not present

## 2016-09-19 DIAGNOSIS — H18413 Arcus senilis, bilateral: Secondary | ICD-10-CM | POA: Diagnosis not present

## 2016-10-09 DIAGNOSIS — R7302 Impaired glucose tolerance (oral): Secondary | ICD-10-CM | POA: Diagnosis not present

## 2016-10-09 DIAGNOSIS — E298 Other testicular dysfunction: Secondary | ICD-10-CM | POA: Diagnosis not present

## 2016-10-09 DIAGNOSIS — Z6831 Body mass index (BMI) 31.0-31.9, adult: Secondary | ICD-10-CM | POA: Diagnosis not present

## 2016-10-09 DIAGNOSIS — D692 Other nonthrombocytopenic purpura: Secondary | ICD-10-CM | POA: Diagnosis not present

## 2016-10-09 DIAGNOSIS — Z9861 Coronary angioplasty status: Secondary | ICD-10-CM | POA: Diagnosis not present

## 2016-10-09 DIAGNOSIS — I208 Other forms of angina pectoris: Secondary | ICD-10-CM | POA: Diagnosis not present

## 2016-10-09 DIAGNOSIS — I519 Heart disease, unspecified: Secondary | ICD-10-CM | POA: Diagnosis not present

## 2016-10-09 DIAGNOSIS — N183 Chronic kidney disease, stage 3 (moderate): Secondary | ICD-10-CM | POA: Diagnosis not present

## 2016-10-09 DIAGNOSIS — I2789 Other specified pulmonary heart diseases: Secondary | ICD-10-CM | POA: Diagnosis not present

## 2016-10-09 DIAGNOSIS — E7800 Pure hypercholesterolemia, unspecified: Secondary | ICD-10-CM | POA: Diagnosis not present

## 2016-10-09 DIAGNOSIS — I130 Hypertensive heart and chronic kidney disease with heart failure and stage 1 through stage 4 chronic kidney disease, or unspecified chronic kidney disease: Secondary | ICD-10-CM | POA: Diagnosis not present

## 2016-10-09 DIAGNOSIS — E668 Other obesity: Secondary | ICD-10-CM | POA: Diagnosis not present

## 2016-11-11 DIAGNOSIS — H02831 Dermatochalasis of right upper eyelid: Secondary | ICD-10-CM | POA: Diagnosis not present

## 2016-11-11 DIAGNOSIS — H02834 Dermatochalasis of left upper eyelid: Secondary | ICD-10-CM | POA: Diagnosis not present

## 2017-01-04 DIAGNOSIS — R509 Fever, unspecified: Secondary | ICD-10-CM | POA: Diagnosis not present

## 2017-01-04 DIAGNOSIS — J206 Acute bronchitis due to rhinovirus: Secondary | ICD-10-CM | POA: Diagnosis not present

## 2017-01-04 DIAGNOSIS — J039 Acute tonsillitis, unspecified: Secondary | ICD-10-CM | POA: Diagnosis not present

## 2017-01-27 DIAGNOSIS — H02831 Dermatochalasis of right upper eyelid: Secondary | ICD-10-CM | POA: Diagnosis not present

## 2017-01-27 DIAGNOSIS — H53453 Other localized visual field defect, bilateral: Secondary | ICD-10-CM | POA: Diagnosis not present

## 2017-01-27 DIAGNOSIS — H02834 Dermatochalasis of left upper eyelid: Secondary | ICD-10-CM | POA: Diagnosis not present

## 2017-03-19 DIAGNOSIS — I209 Angina pectoris, unspecified: Secondary | ICD-10-CM | POA: Diagnosis not present

## 2017-03-19 DIAGNOSIS — I951 Orthostatic hypotension: Secondary | ICD-10-CM | POA: Diagnosis not present

## 2017-03-19 DIAGNOSIS — I25119 Atherosclerotic heart disease of native coronary artery with unspecified angina pectoris: Secondary | ICD-10-CM | POA: Diagnosis not present

## 2017-03-19 DIAGNOSIS — R0609 Other forms of dyspnea: Secondary | ICD-10-CM | POA: Diagnosis not present

## 2017-03-27 DIAGNOSIS — H04123 Dry eye syndrome of bilateral lacrimal glands: Secondary | ICD-10-CM | POA: Diagnosis not present

## 2017-03-27 DIAGNOSIS — Z961 Presence of intraocular lens: Secondary | ICD-10-CM | POA: Diagnosis not present

## 2017-03-27 DIAGNOSIS — H40023 Open angle with borderline findings, high risk, bilateral: Secondary | ICD-10-CM | POA: Diagnosis not present

## 2017-03-27 DIAGNOSIS — H40003 Preglaucoma, unspecified, bilateral: Secondary | ICD-10-CM | POA: Diagnosis not present

## 2017-03-27 DIAGNOSIS — Z9849 Cataract extraction status, unspecified eye: Secondary | ICD-10-CM | POA: Diagnosis not present

## 2017-03-27 DIAGNOSIS — H5340 Unspecified visual field defects: Secondary | ICD-10-CM | POA: Diagnosis not present

## 2017-03-27 DIAGNOSIS — H18413 Arcus senilis, bilateral: Secondary | ICD-10-CM | POA: Diagnosis not present

## 2017-03-27 DIAGNOSIS — H353131 Nonexudative age-related macular degeneration, bilateral, early dry stage: Secondary | ICD-10-CM | POA: Diagnosis not present

## 2017-03-27 DIAGNOSIS — H11423 Conjunctival edema, bilateral: Secondary | ICD-10-CM | POA: Diagnosis not present

## 2017-03-27 DIAGNOSIS — H11153 Pinguecula, bilateral: Secondary | ICD-10-CM | POA: Diagnosis not present

## 2017-04-01 DIAGNOSIS — E7800 Pure hypercholesterolemia, unspecified: Secondary | ICD-10-CM | POA: Diagnosis not present

## 2017-04-01 DIAGNOSIS — R82998 Other abnormal findings in urine: Secondary | ICD-10-CM | POA: Diagnosis not present

## 2017-04-01 DIAGNOSIS — R7302 Impaired glucose tolerance (oral): Secondary | ICD-10-CM | POA: Diagnosis not present

## 2017-04-01 DIAGNOSIS — I10 Essential (primary) hypertension: Secondary | ICD-10-CM | POA: Diagnosis not present

## 2017-04-01 DIAGNOSIS — Z125 Encounter for screening for malignant neoplasm of prostate: Secondary | ICD-10-CM | POA: Diagnosis not present

## 2017-04-08 DIAGNOSIS — I2789 Other specified pulmonary heart diseases: Secondary | ICD-10-CM | POA: Diagnosis not present

## 2017-04-08 DIAGNOSIS — I208 Other forms of angina pectoris: Secondary | ICD-10-CM | POA: Diagnosis not present

## 2017-04-08 DIAGNOSIS — D692 Other nonthrombocytopenic purpura: Secondary | ICD-10-CM | POA: Diagnosis not present

## 2017-04-08 DIAGNOSIS — R7302 Impaired glucose tolerance (oral): Secondary | ICD-10-CM | POA: Diagnosis not present

## 2017-04-08 DIAGNOSIS — I130 Hypertensive heart and chronic kidney disease with heart failure and stage 1 through stage 4 chronic kidney disease, or unspecified chronic kidney disease: Secondary | ICD-10-CM | POA: Diagnosis not present

## 2017-04-08 DIAGNOSIS — Z9861 Coronary angioplasty status: Secondary | ICD-10-CM | POA: Diagnosis not present

## 2017-04-08 DIAGNOSIS — N183 Chronic kidney disease, stage 3 (moderate): Secondary | ICD-10-CM | POA: Diagnosis not present

## 2017-04-08 DIAGNOSIS — Z0000 Encounter for general adult medical examination without abnormal findings: Secondary | ICD-10-CM | POA: Diagnosis not present

## 2017-04-08 DIAGNOSIS — I519 Heart disease, unspecified: Secondary | ICD-10-CM | POA: Diagnosis not present

## 2017-04-08 DIAGNOSIS — Z1389 Encounter for screening for other disorder: Secondary | ICD-10-CM | POA: Diagnosis not present

## 2017-04-08 DIAGNOSIS — Z6831 Body mass index (BMI) 31.0-31.9, adult: Secondary | ICD-10-CM | POA: Diagnosis not present

## 2017-04-08 DIAGNOSIS — E668 Other obesity: Secondary | ICD-10-CM | POA: Diagnosis not present

## 2017-04-09 DIAGNOSIS — Z23 Encounter for immunization: Secondary | ICD-10-CM | POA: Diagnosis not present

## 2017-04-14 DIAGNOSIS — Z1212 Encounter for screening for malignant neoplasm of rectum: Secondary | ICD-10-CM | POA: Diagnosis not present

## 2017-04-20 DIAGNOSIS — M5137 Other intervertebral disc degeneration, lumbosacral region: Secondary | ICD-10-CM | POA: Diagnosis not present

## 2017-04-20 DIAGNOSIS — Q72891 Other reduction defects of right lower limb: Secondary | ICD-10-CM | POA: Diagnosis not present

## 2017-04-20 DIAGNOSIS — M9903 Segmental and somatic dysfunction of lumbar region: Secondary | ICD-10-CM | POA: Diagnosis not present

## 2017-04-20 DIAGNOSIS — M5417 Radiculopathy, lumbosacral region: Secondary | ICD-10-CM | POA: Diagnosis not present

## 2017-04-20 DIAGNOSIS — M9904 Segmental and somatic dysfunction of sacral region: Secondary | ICD-10-CM | POA: Diagnosis not present

## 2017-04-20 DIAGNOSIS — M9905 Segmental and somatic dysfunction of pelvic region: Secondary | ICD-10-CM | POA: Diagnosis not present

## 2017-04-21 DIAGNOSIS — M5137 Other intervertebral disc degeneration, lumbosacral region: Secondary | ICD-10-CM | POA: Diagnosis not present

## 2017-04-21 DIAGNOSIS — M5417 Radiculopathy, lumbosacral region: Secondary | ICD-10-CM | POA: Diagnosis not present

## 2017-04-21 DIAGNOSIS — Q72891 Other reduction defects of right lower limb: Secondary | ICD-10-CM | POA: Diagnosis not present

## 2017-04-21 DIAGNOSIS — M9904 Segmental and somatic dysfunction of sacral region: Secondary | ICD-10-CM | POA: Diagnosis not present

## 2017-04-21 DIAGNOSIS — M9903 Segmental and somatic dysfunction of lumbar region: Secondary | ICD-10-CM | POA: Diagnosis not present

## 2017-04-21 DIAGNOSIS — M9905 Segmental and somatic dysfunction of pelvic region: Secondary | ICD-10-CM | POA: Diagnosis not present

## 2017-04-22 DIAGNOSIS — M5137 Other intervertebral disc degeneration, lumbosacral region: Secondary | ICD-10-CM | POA: Diagnosis not present

## 2017-04-22 DIAGNOSIS — Q72891 Other reduction defects of right lower limb: Secondary | ICD-10-CM | POA: Diagnosis not present

## 2017-04-22 DIAGNOSIS — M9903 Segmental and somatic dysfunction of lumbar region: Secondary | ICD-10-CM | POA: Diagnosis not present

## 2017-04-22 DIAGNOSIS — M9905 Segmental and somatic dysfunction of pelvic region: Secondary | ICD-10-CM | POA: Diagnosis not present

## 2017-04-22 DIAGNOSIS — M9904 Segmental and somatic dysfunction of sacral region: Secondary | ICD-10-CM | POA: Diagnosis not present

## 2017-04-22 DIAGNOSIS — M5417 Radiculopathy, lumbosacral region: Secondary | ICD-10-CM | POA: Diagnosis not present

## 2017-04-23 DIAGNOSIS — M5417 Radiculopathy, lumbosacral region: Secondary | ICD-10-CM | POA: Diagnosis not present

## 2017-04-23 DIAGNOSIS — M9903 Segmental and somatic dysfunction of lumbar region: Secondary | ICD-10-CM | POA: Diagnosis not present

## 2017-04-23 DIAGNOSIS — M5137 Other intervertebral disc degeneration, lumbosacral region: Secondary | ICD-10-CM | POA: Diagnosis not present

## 2017-04-23 DIAGNOSIS — Q72891 Other reduction defects of right lower limb: Secondary | ICD-10-CM | POA: Diagnosis not present

## 2017-04-23 DIAGNOSIS — M9905 Segmental and somatic dysfunction of pelvic region: Secondary | ICD-10-CM | POA: Diagnosis not present

## 2017-04-23 DIAGNOSIS — M9904 Segmental and somatic dysfunction of sacral region: Secondary | ICD-10-CM | POA: Diagnosis not present

## 2017-04-24 DIAGNOSIS — M9903 Segmental and somatic dysfunction of lumbar region: Secondary | ICD-10-CM | POA: Diagnosis not present

## 2017-04-24 DIAGNOSIS — M5137 Other intervertebral disc degeneration, lumbosacral region: Secondary | ICD-10-CM | POA: Diagnosis not present

## 2017-04-24 DIAGNOSIS — M545 Low back pain: Secondary | ICD-10-CM | POA: Diagnosis not present

## 2017-04-24 DIAGNOSIS — Q72891 Other reduction defects of right lower limb: Secondary | ICD-10-CM | POA: Diagnosis not present

## 2017-04-24 DIAGNOSIS — M9904 Segmental and somatic dysfunction of sacral region: Secondary | ICD-10-CM | POA: Diagnosis not present

## 2017-04-24 DIAGNOSIS — M9905 Segmental and somatic dysfunction of pelvic region: Secondary | ICD-10-CM | POA: Diagnosis not present

## 2017-04-24 DIAGNOSIS — M5417 Radiculopathy, lumbosacral region: Secondary | ICD-10-CM | POA: Diagnosis not present

## 2017-04-27 DIAGNOSIS — M5417 Radiculopathy, lumbosacral region: Secondary | ICD-10-CM | POA: Diagnosis not present

## 2017-04-27 DIAGNOSIS — M9904 Segmental and somatic dysfunction of sacral region: Secondary | ICD-10-CM | POA: Diagnosis not present

## 2017-04-27 DIAGNOSIS — M9905 Segmental and somatic dysfunction of pelvic region: Secondary | ICD-10-CM | POA: Diagnosis not present

## 2017-04-27 DIAGNOSIS — M5137 Other intervertebral disc degeneration, lumbosacral region: Secondary | ICD-10-CM | POA: Diagnosis not present

## 2017-04-27 DIAGNOSIS — Q72891 Other reduction defects of right lower limb: Secondary | ICD-10-CM | POA: Diagnosis not present

## 2017-04-27 DIAGNOSIS — M9903 Segmental and somatic dysfunction of lumbar region: Secondary | ICD-10-CM | POA: Diagnosis not present

## 2017-04-28 DIAGNOSIS — M5417 Radiculopathy, lumbosacral region: Secondary | ICD-10-CM | POA: Diagnosis not present

## 2017-04-28 DIAGNOSIS — M9905 Segmental and somatic dysfunction of pelvic region: Secondary | ICD-10-CM | POA: Diagnosis not present

## 2017-04-28 DIAGNOSIS — M9903 Segmental and somatic dysfunction of lumbar region: Secondary | ICD-10-CM | POA: Diagnosis not present

## 2017-04-28 DIAGNOSIS — M9904 Segmental and somatic dysfunction of sacral region: Secondary | ICD-10-CM | POA: Diagnosis not present

## 2017-04-28 DIAGNOSIS — Q72891 Other reduction defects of right lower limb: Secondary | ICD-10-CM | POA: Diagnosis not present

## 2017-04-28 DIAGNOSIS — M5137 Other intervertebral disc degeneration, lumbosacral region: Secondary | ICD-10-CM | POA: Diagnosis not present

## 2017-04-29 DIAGNOSIS — M545 Low back pain: Secondary | ICD-10-CM | POA: Diagnosis not present

## 2017-04-29 DIAGNOSIS — M5416 Radiculopathy, lumbar region: Secondary | ICD-10-CM | POA: Diagnosis not present

## 2017-04-29 DIAGNOSIS — M5136 Other intervertebral disc degeneration, lumbar region: Secondary | ICD-10-CM | POA: Diagnosis not present

## 2017-04-30 DIAGNOSIS — Q72891 Other reduction defects of right lower limb: Secondary | ICD-10-CM | POA: Diagnosis not present

## 2017-04-30 DIAGNOSIS — M5137 Other intervertebral disc degeneration, lumbosacral region: Secondary | ICD-10-CM | POA: Diagnosis not present

## 2017-04-30 DIAGNOSIS — M9904 Segmental and somatic dysfunction of sacral region: Secondary | ICD-10-CM | POA: Diagnosis not present

## 2017-04-30 DIAGNOSIS — M9905 Segmental and somatic dysfunction of pelvic region: Secondary | ICD-10-CM | POA: Diagnosis not present

## 2017-04-30 DIAGNOSIS — M5417 Radiculopathy, lumbosacral region: Secondary | ICD-10-CM | POA: Diagnosis not present

## 2017-04-30 DIAGNOSIS — M9903 Segmental and somatic dysfunction of lumbar region: Secondary | ICD-10-CM | POA: Diagnosis not present

## 2017-05-02 DIAGNOSIS — M545 Low back pain: Secondary | ICD-10-CM | POA: Diagnosis not present

## 2017-05-04 DIAGNOSIS — M5137 Other intervertebral disc degeneration, lumbosacral region: Secondary | ICD-10-CM | POA: Diagnosis not present

## 2017-05-04 DIAGNOSIS — M9903 Segmental and somatic dysfunction of lumbar region: Secondary | ICD-10-CM | POA: Diagnosis not present

## 2017-05-04 DIAGNOSIS — M9905 Segmental and somatic dysfunction of pelvic region: Secondary | ICD-10-CM | POA: Diagnosis not present

## 2017-05-04 DIAGNOSIS — Q72891 Other reduction defects of right lower limb: Secondary | ICD-10-CM | POA: Diagnosis not present

## 2017-05-04 DIAGNOSIS — M9904 Segmental and somatic dysfunction of sacral region: Secondary | ICD-10-CM | POA: Diagnosis not present

## 2017-05-04 DIAGNOSIS — M5417 Radiculopathy, lumbosacral region: Secondary | ICD-10-CM | POA: Diagnosis not present

## 2017-05-05 DIAGNOSIS — M9904 Segmental and somatic dysfunction of sacral region: Secondary | ICD-10-CM | POA: Diagnosis not present

## 2017-05-05 DIAGNOSIS — M5137 Other intervertebral disc degeneration, lumbosacral region: Secondary | ICD-10-CM | POA: Diagnosis not present

## 2017-05-05 DIAGNOSIS — Q72891 Other reduction defects of right lower limb: Secondary | ICD-10-CM | POA: Diagnosis not present

## 2017-05-05 DIAGNOSIS — M9903 Segmental and somatic dysfunction of lumbar region: Secondary | ICD-10-CM | POA: Diagnosis not present

## 2017-05-05 DIAGNOSIS — M9905 Segmental and somatic dysfunction of pelvic region: Secondary | ICD-10-CM | POA: Diagnosis not present

## 2017-05-05 DIAGNOSIS — M5417 Radiculopathy, lumbosacral region: Secondary | ICD-10-CM | POA: Diagnosis not present

## 2017-05-07 DIAGNOSIS — Q72891 Other reduction defects of right lower limb: Secondary | ICD-10-CM | POA: Diagnosis not present

## 2017-05-07 DIAGNOSIS — M5137 Other intervertebral disc degeneration, lumbosacral region: Secondary | ICD-10-CM | POA: Diagnosis not present

## 2017-05-07 DIAGNOSIS — M9903 Segmental and somatic dysfunction of lumbar region: Secondary | ICD-10-CM | POA: Diagnosis not present

## 2017-05-07 DIAGNOSIS — M9904 Segmental and somatic dysfunction of sacral region: Secondary | ICD-10-CM | POA: Diagnosis not present

## 2017-05-07 DIAGNOSIS — M5417 Radiculopathy, lumbosacral region: Secondary | ICD-10-CM | POA: Diagnosis not present

## 2017-05-07 DIAGNOSIS — M9905 Segmental and somatic dysfunction of pelvic region: Secondary | ICD-10-CM | POA: Diagnosis not present

## 2017-05-11 DIAGNOSIS — Q72891 Other reduction defects of right lower limb: Secondary | ICD-10-CM | POA: Diagnosis not present

## 2017-05-11 DIAGNOSIS — M5417 Radiculopathy, lumbosacral region: Secondary | ICD-10-CM | POA: Diagnosis not present

## 2017-05-11 DIAGNOSIS — M5137 Other intervertebral disc degeneration, lumbosacral region: Secondary | ICD-10-CM | POA: Diagnosis not present

## 2017-05-11 DIAGNOSIS — M9905 Segmental and somatic dysfunction of pelvic region: Secondary | ICD-10-CM | POA: Diagnosis not present

## 2017-05-11 DIAGNOSIS — M9904 Segmental and somatic dysfunction of sacral region: Secondary | ICD-10-CM | POA: Diagnosis not present

## 2017-05-11 DIAGNOSIS — M9903 Segmental and somatic dysfunction of lumbar region: Secondary | ICD-10-CM | POA: Diagnosis not present

## 2017-05-12 DIAGNOSIS — M9905 Segmental and somatic dysfunction of pelvic region: Secondary | ICD-10-CM | POA: Diagnosis not present

## 2017-05-12 DIAGNOSIS — M5137 Other intervertebral disc degeneration, lumbosacral region: Secondary | ICD-10-CM | POA: Diagnosis not present

## 2017-05-12 DIAGNOSIS — M9903 Segmental and somatic dysfunction of lumbar region: Secondary | ICD-10-CM | POA: Diagnosis not present

## 2017-05-12 DIAGNOSIS — Q72891 Other reduction defects of right lower limb: Secondary | ICD-10-CM | POA: Diagnosis not present

## 2017-05-12 DIAGNOSIS — M5417 Radiculopathy, lumbosacral region: Secondary | ICD-10-CM | POA: Diagnosis not present

## 2017-05-12 DIAGNOSIS — M9904 Segmental and somatic dysfunction of sacral region: Secondary | ICD-10-CM | POA: Diagnosis not present

## 2017-05-14 DIAGNOSIS — M5417 Radiculopathy, lumbosacral region: Secondary | ICD-10-CM | POA: Diagnosis not present

## 2017-05-14 DIAGNOSIS — Q72891 Other reduction defects of right lower limb: Secondary | ICD-10-CM | POA: Diagnosis not present

## 2017-05-14 DIAGNOSIS — M5137 Other intervertebral disc degeneration, lumbosacral region: Secondary | ICD-10-CM | POA: Diagnosis not present

## 2017-05-14 DIAGNOSIS — M9903 Segmental and somatic dysfunction of lumbar region: Secondary | ICD-10-CM | POA: Diagnosis not present

## 2017-05-14 DIAGNOSIS — M9905 Segmental and somatic dysfunction of pelvic region: Secondary | ICD-10-CM | POA: Diagnosis not present

## 2017-05-14 DIAGNOSIS — M9904 Segmental and somatic dysfunction of sacral region: Secondary | ICD-10-CM | POA: Diagnosis not present

## 2017-05-18 DIAGNOSIS — M5416 Radiculopathy, lumbar region: Secondary | ICD-10-CM | POA: Diagnosis not present

## 2017-05-18 DIAGNOSIS — M5136 Other intervertebral disc degeneration, lumbar region: Secondary | ICD-10-CM | POA: Diagnosis not present

## 2017-05-18 DIAGNOSIS — M545 Low back pain: Secondary | ICD-10-CM | POA: Diagnosis not present

## 2017-06-05 DIAGNOSIS — M5416 Radiculopathy, lumbar region: Secondary | ICD-10-CM | POA: Diagnosis not present

## 2017-06-05 DIAGNOSIS — M5136 Other intervertebral disc degeneration, lumbar region: Secondary | ICD-10-CM | POA: Diagnosis not present

## 2017-06-05 DIAGNOSIS — M545 Low back pain: Secondary | ICD-10-CM | POA: Diagnosis not present

## 2017-06-22 DIAGNOSIS — M545 Low back pain: Secondary | ICD-10-CM | POA: Diagnosis not present

## 2017-06-22 DIAGNOSIS — M5136 Other intervertebral disc degeneration, lumbar region: Secondary | ICD-10-CM | POA: Diagnosis not present

## 2017-06-22 DIAGNOSIS — M5416 Radiculopathy, lumbar region: Secondary | ICD-10-CM | POA: Diagnosis not present

## 2017-09-24 DIAGNOSIS — Z9849 Cataract extraction status, unspecified eye: Secondary | ICD-10-CM | POA: Diagnosis not present

## 2017-09-24 DIAGNOSIS — H18413 Arcus senilis, bilateral: Secondary | ICD-10-CM | POA: Diagnosis not present

## 2017-09-24 DIAGNOSIS — H5212 Myopia, left eye: Secondary | ICD-10-CM | POA: Diagnosis not present

## 2017-09-24 DIAGNOSIS — H353131 Nonexudative age-related macular degeneration, bilateral, early dry stage: Secondary | ICD-10-CM | POA: Diagnosis not present

## 2017-09-24 DIAGNOSIS — H40003 Preglaucoma, unspecified, bilateral: Secondary | ICD-10-CM | POA: Diagnosis not present

## 2017-09-24 DIAGNOSIS — H11823 Conjunctivochalasis, bilateral: Secondary | ICD-10-CM | POA: Diagnosis not present

## 2017-09-24 DIAGNOSIS — H11153 Pinguecula, bilateral: Secondary | ICD-10-CM | POA: Diagnosis not present

## 2017-09-24 DIAGNOSIS — Z961 Presence of intraocular lens: Secondary | ICD-10-CM | POA: Diagnosis not present

## 2017-09-24 DIAGNOSIS — H0100A Unspecified blepharitis right eye, upper and lower eyelids: Secondary | ICD-10-CM | POA: Diagnosis not present

## 2017-09-24 DIAGNOSIS — H0100B Unspecified blepharitis left eye, upper and lower eyelids: Secondary | ICD-10-CM | POA: Diagnosis not present

## 2017-09-24 DIAGNOSIS — H04123 Dry eye syndrome of bilateral lacrimal glands: Secondary | ICD-10-CM | POA: Diagnosis not present

## 2017-09-24 DIAGNOSIS — H40023 Open angle with borderline findings, high risk, bilateral: Secondary | ICD-10-CM | POA: Diagnosis not present

## 2017-10-03 DIAGNOSIS — T169XXA Foreign body in ear, unspecified ear, initial encounter: Secondary | ICD-10-CM | POA: Diagnosis not present

## 2017-10-09 DIAGNOSIS — I272 Pulmonary hypertension, unspecified: Secondary | ICD-10-CM | POA: Diagnosis not present

## 2017-10-09 DIAGNOSIS — I519 Heart disease, unspecified: Secondary | ICD-10-CM | POA: Diagnosis not present

## 2017-10-09 DIAGNOSIS — E668 Other obesity: Secondary | ICD-10-CM | POA: Diagnosis not present

## 2017-10-09 DIAGNOSIS — D692 Other nonthrombocytopenic purpura: Secondary | ICD-10-CM | POA: Diagnosis not present

## 2017-10-09 DIAGNOSIS — Z6832 Body mass index (BMI) 32.0-32.9, adult: Secondary | ICD-10-CM | POA: Diagnosis not present

## 2017-10-09 DIAGNOSIS — R7302 Impaired glucose tolerance (oral): Secondary | ICD-10-CM | POA: Diagnosis not present

## 2017-10-09 DIAGNOSIS — I208 Other forms of angina pectoris: Secondary | ICD-10-CM | POA: Diagnosis not present

## 2017-10-09 DIAGNOSIS — N183 Chronic kidney disease, stage 3 (moderate): Secondary | ICD-10-CM | POA: Diagnosis not present

## 2017-10-09 DIAGNOSIS — E7800 Pure hypercholesterolemia, unspecified: Secondary | ICD-10-CM | POA: Diagnosis not present

## 2017-10-09 DIAGNOSIS — I130 Hypertensive heart and chronic kidney disease with heart failure and stage 1 through stage 4 chronic kidney disease, or unspecified chronic kidney disease: Secondary | ICD-10-CM | POA: Diagnosis not present

## 2017-10-09 DIAGNOSIS — Z9861 Coronary angioplasty status: Secondary | ICD-10-CM | POA: Diagnosis not present

## 2017-10-09 DIAGNOSIS — I10 Essential (primary) hypertension: Secondary | ICD-10-CM | POA: Diagnosis not present

## 2017-10-26 DIAGNOSIS — I25119 Atherosclerotic heart disease of native coronary artery with unspecified angina pectoris: Secondary | ICD-10-CM | POA: Diagnosis not present

## 2017-10-26 DIAGNOSIS — R0609 Other forms of dyspnea: Secondary | ICD-10-CM | POA: Diagnosis not present

## 2017-10-26 DIAGNOSIS — I209 Angina pectoris, unspecified: Secondary | ICD-10-CM | POA: Diagnosis not present

## 2017-10-26 DIAGNOSIS — I951 Orthostatic hypotension: Secondary | ICD-10-CM | POA: Diagnosis not present

## 2017-12-15 DIAGNOSIS — M25561 Pain in right knee: Secondary | ICD-10-CM | POA: Diagnosis not present

## 2017-12-15 DIAGNOSIS — M7041 Prepatellar bursitis, right knee: Secondary | ICD-10-CM | POA: Diagnosis not present

## 2017-12-17 DIAGNOSIS — M7041 Prepatellar bursitis, right knee: Secondary | ICD-10-CM | POA: Diagnosis not present

## 2017-12-19 DIAGNOSIS — M7041 Prepatellar bursitis, right knee: Secondary | ICD-10-CM | POA: Diagnosis not present

## 2017-12-21 DIAGNOSIS — M7041 Prepatellar bursitis, right knee: Secondary | ICD-10-CM | POA: Diagnosis not present

## 2017-12-28 DIAGNOSIS — M7041 Prepatellar bursitis, right knee: Secondary | ICD-10-CM | POA: Diagnosis not present

## 2018-03-08 DIAGNOSIS — Z6833 Body mass index (BMI) 33.0-33.9, adult: Secondary | ICD-10-CM | POA: Diagnosis not present

## 2018-03-08 DIAGNOSIS — J209 Acute bronchitis, unspecified: Secondary | ICD-10-CM | POA: Diagnosis not present

## 2018-03-08 DIAGNOSIS — I10 Essential (primary) hypertension: Secondary | ICD-10-CM | POA: Diagnosis not present

## 2018-03-08 DIAGNOSIS — R05 Cough: Secondary | ICD-10-CM | POA: Diagnosis not present

## 2018-04-01 DIAGNOSIS — H40003 Preglaucoma, unspecified, bilateral: Secondary | ICD-10-CM | POA: Diagnosis not present

## 2018-04-01 DIAGNOSIS — H11823 Conjunctivochalasis, bilateral: Secondary | ICD-10-CM | POA: Diagnosis not present

## 2018-04-01 DIAGNOSIS — H11153 Pinguecula, bilateral: Secondary | ICD-10-CM | POA: Diagnosis not present

## 2018-04-01 DIAGNOSIS — H01023 Squamous blepharitis right eye, unspecified eyelid: Secondary | ICD-10-CM | POA: Diagnosis not present

## 2018-04-01 DIAGNOSIS — H40023 Open angle with borderline findings, high risk, bilateral: Secondary | ICD-10-CM | POA: Diagnosis not present

## 2018-04-01 DIAGNOSIS — H18413 Arcus senilis, bilateral: Secondary | ICD-10-CM | POA: Diagnosis not present

## 2018-04-01 DIAGNOSIS — H01026 Squamous blepharitis left eye, unspecified eyelid: Secondary | ICD-10-CM | POA: Diagnosis not present

## 2018-04-01 DIAGNOSIS — H353131 Nonexudative age-related macular degeneration, bilateral, early dry stage: Secondary | ICD-10-CM | POA: Diagnosis not present

## 2018-04-01 DIAGNOSIS — H04123 Dry eye syndrome of bilateral lacrimal glands: Secondary | ICD-10-CM | POA: Diagnosis not present

## 2018-04-05 DIAGNOSIS — R7302 Impaired glucose tolerance (oral): Secondary | ICD-10-CM | POA: Diagnosis not present

## 2018-04-05 DIAGNOSIS — R82998 Other abnormal findings in urine: Secondary | ICD-10-CM | POA: Diagnosis not present

## 2018-04-05 DIAGNOSIS — I10 Essential (primary) hypertension: Secondary | ICD-10-CM | POA: Diagnosis not present

## 2018-04-05 DIAGNOSIS — Z125 Encounter for screening for malignant neoplasm of prostate: Secondary | ICD-10-CM | POA: Diagnosis not present

## 2018-04-05 DIAGNOSIS — E7800 Pure hypercholesterolemia, unspecified: Secondary | ICD-10-CM | POA: Diagnosis not present

## 2018-04-06 DIAGNOSIS — H40023 Open angle with borderline findings, high risk, bilateral: Secondary | ICD-10-CM | POA: Diagnosis not present

## 2018-04-12 DIAGNOSIS — R7302 Impaired glucose tolerance (oral): Secondary | ICD-10-CM | POA: Diagnosis not present

## 2018-04-12 DIAGNOSIS — Z6831 Body mass index (BMI) 31.0-31.9, adult: Secondary | ICD-10-CM | POA: Diagnosis not present

## 2018-04-12 DIAGNOSIS — Z9861 Coronary angioplasty status: Secondary | ICD-10-CM | POA: Diagnosis not present

## 2018-04-12 DIAGNOSIS — Z0000 Encounter for general adult medical examination without abnormal findings: Secondary | ICD-10-CM | POA: Diagnosis not present

## 2018-04-12 DIAGNOSIS — Z23 Encounter for immunization: Secondary | ICD-10-CM | POA: Diagnosis not present

## 2018-04-12 DIAGNOSIS — I208 Other forms of angina pectoris: Secondary | ICD-10-CM | POA: Diagnosis not present

## 2018-04-12 DIAGNOSIS — I130 Hypertensive heart and chronic kidney disease with heart failure and stage 1 through stage 4 chronic kidney disease, or unspecified chronic kidney disease: Secondary | ICD-10-CM | POA: Diagnosis not present

## 2018-04-12 DIAGNOSIS — N183 Chronic kidney disease, stage 3 (moderate): Secondary | ICD-10-CM | POA: Diagnosis not present

## 2018-04-12 DIAGNOSIS — E668 Other obesity: Secondary | ICD-10-CM | POA: Diagnosis not present

## 2018-04-12 DIAGNOSIS — I2789 Other specified pulmonary heart diseases: Secondary | ICD-10-CM | POA: Diagnosis not present

## 2018-04-12 DIAGNOSIS — I10 Essential (primary) hypertension: Secondary | ICD-10-CM | POA: Diagnosis not present

## 2018-04-12 DIAGNOSIS — Z1331 Encounter for screening for depression: Secondary | ICD-10-CM | POA: Diagnosis not present

## 2018-04-12 DIAGNOSIS — E7800 Pure hypercholesterolemia, unspecified: Secondary | ICD-10-CM | POA: Diagnosis not present

## 2018-04-14 ENCOUNTER — Other Ambulatory Visit: Payer: Self-pay | Admitting: Internal Medicine

## 2018-04-14 DIAGNOSIS — R599 Enlarged lymph nodes, unspecified: Secondary | ICD-10-CM

## 2018-04-16 DIAGNOSIS — Z1212 Encounter for screening for malignant neoplasm of rectum: Secondary | ICD-10-CM | POA: Diagnosis not present

## 2018-04-20 ENCOUNTER — Ambulatory Visit
Admission: RE | Admit: 2018-04-20 | Discharge: 2018-04-20 | Disposition: A | Discharge status: Home / Self Care | Payer: PPO | Source: Ambulatory Visit | Attending: Internal Medicine | Admitting: Internal Medicine

## 2018-04-20 DIAGNOSIS — D170 Benign lipomatous neoplasm of skin and subcutaneous tissue of head, face and neck: Secondary | ICD-10-CM | POA: Diagnosis not present

## 2018-04-20 DIAGNOSIS — D179 Benign lipomatous neoplasm, unspecified: Secondary | ICD-10-CM

## 2018-04-20 DIAGNOSIS — R599 Enlarged lymph nodes, unspecified: Secondary | ICD-10-CM

## 2018-04-20 HISTORY — DX: Benign lipomatous neoplasm, unspecified: D17.9

## 2018-04-20 MED ORDER — IOPAMIDOL (ISOVUE-300) INJECTION 61%
75.0000 mL | Freq: Once | INTRAVENOUS | Status: AC | PRN
  Administered 2018-04-20: 75 mL via INTRAVENOUS

## 2018-05-03 ENCOUNTER — Ambulatory Visit: Payer: Self-pay | Admitting: Cardiology

## 2018-05-09 DIAGNOSIS — I517 Cardiomegaly: Secondary | ICD-10-CM

## 2018-05-09 DIAGNOSIS — I454 Nonspecific intraventricular block: Secondary | ICD-10-CM

## 2018-05-09 DIAGNOSIS — I444 Left anterior fascicular block: Secondary | ICD-10-CM

## 2018-05-09 DIAGNOSIS — R9431 Abnormal electrocardiogram [ECG] [EKG]: Secondary | ICD-10-CM

## 2018-05-09 HISTORY — DX: Left anterior fascicular block: I44.4

## 2018-05-09 HISTORY — DX: Nonspecific intraventricular block: I45.4

## 2018-05-09 HISTORY — DX: Abnormal electrocardiogram (ECG) (EKG): R94.31

## 2018-05-09 HISTORY — DX: Cardiomegaly: I51.7

## 2018-05-18 DIAGNOSIS — N471 Phimosis: Secondary | ICD-10-CM | POA: Diagnosis not present

## 2018-05-27 NOTE — Progress Notes (Signed)
Subjective:  Primary Physician:  Haywood Pao, MD  Patient ID: [name], male    DOB: 1945-10-28, 73 y.o.   MRN: [id number]  Chief Complaint  Patient presents with  . Coronary Artery Disease  . Follow-up    62mh     HPI: [name] is a 73y.o. male  with known history of coronary artery disease and chronic stable angina, hypertension, mixed hyperlipidemia, GERD, chronic back pain, DJD. He has stable angina. 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  Patient presents for 6 month follow up for CAD. He has mild dyspnea that is remained stable, no PND or orthopnea. No TIA or claudication.  He is presently doing well and has not used sublingual nitroglycerin in the past 6 months.  He has mild chronic dyspnea that is remained stable.  Past Medical History:  Diagnosis Date  . Anginal pain ([place]   . Arthritis   . Blood transfusion without reported diagnosis 2006   during open heart surgery  . CAD (coronary artery disease)   . GERD (gastroesophageal reflux disease)   . Hyperlipidemia   . Hypertension   . Pneumonia   . Tuberculosis 1956    Past Surgical History:  Procedure Laterality Date  . ANGIOPLASTY  2010   3 stents placed  . BACK SURGERY    . CATARACT EXTRACTION W/ INTRAOCULAR LENS  IMPLANT, BILATERAL  2012   bilateral  . COLONOSCOPY    . CORONARY ARTERY BYPASS GRAFT    . heart bypass  2006   4 vessels  . KNEE ARTHROSCOPY  2001  . [place]  1998  . TENDON RECONSTRUCTION Right 10/27/2013   Procedure: RIGHT OPEN TRICEPS REPAIR;  Surgeon: TRenette Butters MD;  [facility] [facility]  Service: Orthopedics;  Laterality: Right;    Social History   Socioeconomic History  . Marital status: Married    Spouse name: Not on file  . Number of children: 3  . Years of education: Not on file  . Highest education level: Not on file   Occupational History  . Not on file  Social Needs  . Financial resource strain: Not on file  . Food insecurity:    Worry: Not on file    Inability: Not on file  . Transportation needs:    Medical: Not on file    Non-medical: Not on file  Tobacco Use  . Smoking status: Never Smoker  . Smokeless tobacco: Never Used  Substance and Sexual Activity  . Alcohol use: No  . Drug use: No  . Sexual activity: Not on file  Lifestyle  . Physical activity:    Days per week: Not on file    Minutes per session: Not on file  . Stress: Not on file  Relationships  . Social connections:    Talks on phone: Not on file    Gets together: Not on file    Attends religious service: Not on file    Active member of club or organization: Not on file    Attends meetings of clubs or organizations: Not on file    Relationship status: Not on file  . Intimate partner violence:    Fear of current or ex partner: Not on file    Emotionally abused: Not on file    Physically abused: Not on file    Forced sexual activity: Not  on file  Other Topics Concern  . Not on file  Social History Narrative  . Not on file    Current Outpatient Medications on File Prior to Visit  Medication Sig Dispense Refill  . acetaminophen (TYLENOL) 500 MG tablet Take 1,000 mg by mouth every morning.    Marland Kitchen aspirin 325 MG tablet Take 325 mg by mouth every evening.     . Calcium Carbonate Antacid (TUMS ULTRA PO) Take 2 tablets by mouth daily as needed (acid).     . Coenzyme Q10 (COQ10) 100 MG CAPS Take 100 mg by mouth 2 (two) times daily.    . CRESTOR 20 MG tablet Take 20 mg by mouth every evening.     . finasteride (PROSCAR) 5 MG tablet Take 1 tablet by mouth daily.    . fish oil-omega-3 fatty acids 1000 MG capsule Take 1 g by mouth 2 (two) times daily.    Marland Kitchen losartan (COZAAR) 25 MG tablet Take 25 mg by mouth daily.    . Methylcobalamin (B12-ACTIVE PO) Take by mouth daily.    . metoprolol tartrate (LOPRESSOR) 25 MG tablet Take 25 mg  by mouth daily.     . Multiple Vitamin (MULTIVITAMIN) tablet Take 1 tablet by mouth daily.    . Multiple Vitamins-Minerals (PRESERVISION AREDS 2) CAPS Take 1 capsule by mouth 2 (two) times daily.    Marland Kitchen NITROSTAT 0.4 MG SL tablet Place 0.4 mg under the tongue every 5 (five) minutes as needed for chest pain.     . pantoprazole (PROTONIX) 40 MG tablet Take 1 tablet (40 mg total) by mouth daily. 30 tablet 1  . Polyethyl Glycol-Propyl Glycol (SYSTANE) 0.4-0.3 % GEL ophthalmic gel Place 1 application into both eyes at bedtime.    Vladimir Faster Glycol-Propyl Glycol (SYSTANE) 0.4-0.3 % GEL Apply 1-2 drops to eye daily as needed (dry eyes).     . polyethylene glycol (MIRALAX) packet Take 17 g by mouth daily. 14 each 0  . RANEXA 1000 MG SR tablet Take 1,000 mg by mouth 2 (two) times daily.     . vitamin C (ASCORBIC ACID) 500 MG tablet Take 500 mg by mouth daily.    Marland Kitchen ZETIA 10 MG tablet Take 5 mg by mouth every evening. Takes 1/2 tablet daily     No current facility-administered medications on file prior to visit.     Review of Systems  Constitutional: Negative for malaise/fatigue and weight loss.  Respiratory: Positive for shortness of breath (on exertion, mild and stable) and wheezing (occasional). Negative for cough and hemoptysis.   Cardiovascular: Negative for chest pain, palpitations, claudication and leg swelling.  Gastrointestinal: Positive for constipation. Negative for abdominal pain, blood in stool, heartburn and vomiting.  Genitourinary: Negative for dysuria.  Musculoskeletal: Positive for back pain (stable) and joint pain. Negative for myalgias.  Neurological: Positive for dizziness (when looking up on occasion). Negative for focal weakness and headaches.  Endo/Heme/Allergies: Does not bruise/bleed easily.  Psychiatric/Behavioral: Negative for depression. The patient is not nervous/anxious.   All other systems reviewed and are negative.      Objective:    Height 5' 7"  (1.702 m), weight  201 lb (91.2 kg), SpO2 99 %. Body mass index is 31.48 kg/m.  Flowsheets     05/28/18 9:44 AM  05/28/18 10:01 AM  05/28/18 10:02 AM   BP  143/71  149/70  135/63   BP Location  LeftArm  LeftArm  LeftArm   Patient Position  Supine  Sitting  Standing  Cuff Size  Large  Large  Large   Pulse  53  55  56   SpO2  95%  98%  99%   Weight  201lb6.4oz(91.4kg)  201lb(91.2kg)  201lb(91.2kg)   Height  5'7"(1.785m  5'7"(1.7066m 5'7"(1.70234m    Physical Exam  Constitutional: He appears well-developed. No distress.  Mildly obese  HENT:  Head: Atraumatic.  Eyes: Conjunctivae are normal.  Neck: Neck supple. No thyromegaly present.  Short neck and difficult to evaluate JVP  Cardiovascular: Normal rate, regular rhythm and normal heart sounds. Exam reveals no gallop.  No murmur heard. Pulses:      Carotid pulses are 2+ on the right side and 2+ on the left side.      Dorsalis pedis pulses are 2+ on the right side and 2+ on the left side.       Posterior tibial pulses are 2+ on the right side and 2+ on the left side.  Femoral and popliteal pulse difficult to feel due to patient's body habitus.   Pulmonary/Chest: Effort normal and breath sounds normal.  Abdominal: Soft. Bowel sounds are normal. A hernia is present. Hernia confirmed positive in the ventral area (reducible).  Obese. Pannus present  Musculoskeletal: Normal range of motion.        General: No edema.  Neurological: He is alert.  Skin: Skin is warm and dry.  Psychiatric: He has a normal mood and affect.   CARDIAC STUDIES:   Echocardiogram 04/08/2016: 1. Left ventricle cavity is normal in size. Mild concentric hypertrophy of the left ventricle. Normal global wall motion. Calculated EF 55%. 2. Left atrial cavity is normal in size. An aneurysm of inter atrial septum is present. No convincing evidence for patent foramen ovale. 3. Right atrial cavity appears to be slightly dilated. 4. Right  ventricle cavity is slightly dilated. Normal right ventricular function. 5. Trace aortic regurgitation. Mild calcification of the aortic valve annulus. Mild aortic valve leaflet calcification. Mildly restricted aortic valve leaflets. Trace aortic valve stenosis with peak gradient 16 and mean gradient 8 mm of Hg. 6. Trace mitral regurgitation. 7. Mild tricuspid regurgitation. Mild pulmonary hypertension with approx. PA syst. pressure of 31 mm of Hg.  c.f. echo. of 08/09/2015, RV, RA appear to be slightly dilated. There is trace AR.  Treadmill stress test 08/10/2015: 1. Resting EKG, normal sinus rhythm, incomplete right bundle branch block.  Stress EKG was nondiagnostic for ischemia due to inability to achieve target heart rate 73% MPHR).  Patient exercised on Bruce protocol for 8 minutes and achieved 10.16 mets.  There was no ST-T changes at peak exercise. Stress terminated due to fatigue.  Stress converted to [facility] stress.  There was no additional ST-T wave changes with from plastic stress protocol. Stress symptoms included dyspnea. 2. The perfusion imaging study demonstrates a very small sized very mild ischemia in the inferior wall.  Left ventricular systolic function calculated QGS was normal at 70%. This is a low risk study.  Carotid artery duplex 08/17/2014: No hemodynamically significant arterial disease in the internal carotid artery bilaterally. Mild soft plaque noted.  CT CHEST 10/31/2013: 1. Poor quality exam for evaluation of pulmonary embolism. Motion and patient arm position degradation. No evidence of large/central pulmonary embolism. 2. Cardiomegaly with prior median sternotomy. 3. Small hiatal hernia.  CT ABDOMEN AND PELVIS 10/31/2013: 1.  No acute process or explanation for pain. 2. Mid to distal left ureteric dilatation. No cause identified. 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 3.  Aortic and branch vessel  atherosclerosis. 4.  Possible constipation.  Cardiac cath 06/2008: LIMA to LAD, Skip SVG to OM1 + D1 with ostial 4x15 non DES, has residual stenosis in the OM 1 limb of the skip graft. SVG to PD, Xience  Stent RCA 3x18, 3.5x15. CABG 1996.  Recent Labs:   04/05/2018: A1c 5.9%.glucose 121, creatinine 1.4, eGFR 49/60, potassium 4.5, CMP normal. Normal H and H, macrocytic indicies. cholesterol 105, triglycerides 122, HDL 40, LDL 41.   04/01/2017: Glucose 113, creatinine 1.2, EGFR 59/72, potassium 4.5, CMP normal.  Normal H&H, MCV 99.7, MCH 33.3, RDW 11.7%, CBC otherwise normal. Cholesterol 103, triglycerides 137, HDL 35, LDL 41.  Apolipoprotein B 60.  03/26/2016: Apolipoprotein B 65. 04/02/2016: normal testosterone level  07/27/2013:  Serum testosterone 260, markedly reduced, free testosterone 4.4, markedly reduced.    Assessment & Recommendations:   Coronary artery disease of native artery of native heart with stable angina pectoris (HCC) - LIMA to LAD, Skip SVG to OM1 + D1 with ostial 4x15 non DES, has residual stenosis in the OM 1 limb of the skip graft. SVG to PD, Xience  Stent RCA 3x18, 3.5x15. - Plan: EKG 12-Lead  Hx of CABG  Essential hypertension  Mixed hyperlipidemia  Screening for AAA (abdominal aortic aneurysm) - Plan: PCV AORTA DUPLEX   EKG 05/28/2018: Sinus bradycardia at the rate of 52 bpm, left atrial abnormality, left axis deviation, left anterior fascicular block.  IVCD, LVH. No significant change from  EKG 10/26/2017.   Recommendation:  Patient is here on a six-month office visit and follow-up of coronary artery disease, he has had to use one sublingual or tenderness in the past 6 months, he remains otherwise stable.  Blood pressure is well controlled, I reviewed his recent labs, lipids are also noted excellent control.  Continue present medications.  With regard to abdominal aortic aneurysm, in 2017 he has had a CT of the abdomen which did not reveal any abdominal aortic  aneurysm but his father died at age of 80 with AAA rupture.  And also there is a question about MRI revealing AAA when it was done a year ago for back pain.  In the office concern, I'll go ahead and set up a abdominal duplex.  Unless abnormal I'll see him back in a year.  [name], MD, [facility] 05/28/2018, 10:12 AM [facility] [place] Pager: [phone] Office: [phone] If no answer Cell [phone]

## 2018-05-28 ENCOUNTER — Ambulatory Visit: Payer: PPO | Admitting: Cardiology

## 2018-05-28 ENCOUNTER — Other Ambulatory Visit: Payer: Self-pay

## 2018-05-28 ENCOUNTER — Encounter: Payer: Self-pay | Admitting: Cardiology

## 2018-05-28 VITALS — Ht 67.0 in | Wt 201.0 lb

## 2018-05-28 DIAGNOSIS — I10 Essential (primary) hypertension: Secondary | ICD-10-CM

## 2018-05-28 DIAGNOSIS — E782 Mixed hyperlipidemia: Secondary | ICD-10-CM | POA: Diagnosis not present

## 2018-05-28 DIAGNOSIS — Z136 Encounter for screening for cardiovascular disorders: Secondary | ICD-10-CM | POA: Diagnosis not present

## 2018-05-28 DIAGNOSIS — I25118 Atherosclerotic heart disease of native coronary artery with other forms of angina pectoris: Secondary | ICD-10-CM | POA: Diagnosis not present

## 2018-05-28 DIAGNOSIS — Z951 Presence of aortocoronary bypass graft: Secondary | ICD-10-CM | POA: Diagnosis not present

## 2018-06-10 ENCOUNTER — Other Ambulatory Visit: Payer: Self-pay | Admitting: Cardiology

## 2018-06-14 ENCOUNTER — Other Ambulatory Visit: Payer: PPO

## 2018-07-21 DIAGNOSIS — D485 Neoplasm of uncertain behavior of skin: Secondary | ICD-10-CM | POA: Diagnosis not present

## 2018-07-21 DIAGNOSIS — C4441 Basal cell carcinoma of skin of scalp and neck: Secondary | ICD-10-CM | POA: Diagnosis not present

## 2018-07-21 DIAGNOSIS — B351 Tinea unguium: Secondary | ICD-10-CM | POA: Diagnosis not present

## 2018-07-28 ENCOUNTER — Other Ambulatory Visit: Payer: Self-pay | Admitting: Urology

## 2018-08-03 ENCOUNTER — Other Ambulatory Visit: Payer: Self-pay | Admitting: Cardiology

## 2018-08-03 MED ORDER — METOPROLOL TARTRATE 25 MG PO TABS: 25.0000 mg | ORAL_TABLET | Freq: Every day | ORAL | 3 refills | Status: DC

## 2018-08-06 ENCOUNTER — Other Ambulatory Visit: Payer: Self-pay

## 2018-08-24 ENCOUNTER — Ambulatory Visit (INDEPENDENT_AMBULATORY_CARE_PROVIDER_SITE_OTHER): Payer: PPO

## 2018-08-24 ENCOUNTER — Other Ambulatory Visit: Payer: Self-pay

## 2018-08-24 DIAGNOSIS — Z136 Encounter for screening for cardiovascular disorders: Secondary | ICD-10-CM

## 2018-08-25 NOTE — Progress Notes (Signed)
S/w patient gave him results of Aortic Duplex

## 2018-08-26 ENCOUNTER — Other Ambulatory Visit (HOSPITAL_COMMUNITY)
Admission: RE | Admit: 2018-08-26 | Discharge: 2018-08-26 | Disposition: A | Discharge status: Home / Self Care | Payer: PPO | Source: Ambulatory Visit | Attending: Urology | Admitting: Urology

## 2018-08-26 DIAGNOSIS — Z1159 Encounter for screening for other viral diseases: Secondary | ICD-10-CM | POA: Diagnosis not present

## 2018-08-26 NOTE — H&P (Signed)
HPI: [name] is a 73 year-old male with symptomatic phimosis.  He has had difficulties with rolling his foreskin back for 1 year. His symptoms have gotten worse over the last year. He does have diabetes.   He does not have dysuria. He has not had inflammation of the head of his penis. He has not had to use creams on the lesion.   He would like to be circumcised.   05/18/18: The patient is a 73 year old male who recently noted difficulty retracting his foreskin. This has been occurring for approximately 1 year and has been slowly worsening. He said it got to the point where he was unable to retract his foreskin but had been using Neosporin and is able to now retract the foreskin but it still causes pain. He has no voiding symptoms other than some postvoid dribbling.     ALLERGIES: Codeine - Vomiting Penicillin - Skin Rash    MEDICATIONS: Crestor 20 mg tablet  Finasteride 5 mg tablet  Metoprolol Succinate 25 mg tablet, extended release 24 hr  Aspirin Ec 325 mg tablet, delayed release  Coq10  Fish Oil  Losartan Potassium 25 mg tablet  Miralax 17 gram/dose powder  Multivitamin  Nitroglycerin 0.4 mg tablet, sublingual  Pantoprazole Sodium 40 mg tablet, delayed release  Preservision Areds 2  Ranolazine Er 1,000 mg tablet, extended release 12 hr  Systane Balance 0.6 % drops  Tums  Vitamin B12 5,000 mcg tablet,disintegrating  Vitamin B6  Vitamin C 500 mg tablet  Zetia 10 mg tablet     GU PSH: None   NON-GU PSH: Cabg; Artery-vein; Four - 2006 Cardiac Stent Placement - 2010 Cataract surgery, Bilateral Elbow Arthroscopy/surgery, Left - 2015 Knee Arthroscopy/surgery, Right Revise Lumbar Spine    GU PMH: None   NON-GU PMH: Arthritis Coronary Artery Disease GERD Hypercholesterolemia Hypertension Personal history of tuberculosis    FAMILY HISTORY: copd - Brother, Father Glaucoma - Brother Heart Disease - Mother, Brother macular degeneration - Mother Poliomyelitis -  Brother Prostate Cancer - Father Tuberculosis - Brother, Father   SOCIAL HISTORY: Marital Status: Married Preferred Language: English; Ethnicity: Not Hispanic Or Latino; Race: White Current Smoking Status: Patient has never smoked.   Tobacco Use Assessment Completed: Used Tobacco in last 30 days? Does not use smokeless tobacco. Has not drank since 05/08/1988.  Does not use drugs. Drinks 4+ caffeinated drinks per day. Has had a blood transfusion.    REVIEW OF SYSTEMS:    GU Review Male:   Patient reports frequent urination, hard to postpone urination, get up at night to urinate, leakage of urine, stream starts and stops, and erection problems. Patient denies burning/ pain with urination, trouble starting your stream, have to strain to urinate , and penile pain.  Gastrointestinal (Upper):   Patient reports indigestion/ heartburn. Patient denies nausea and vomiting.  Gastrointestinal (Lower):   Patient reports constipation. Patient denies diarrhea.  Constitutional:   Patient reports fatigue. Patient denies fever, night sweats, and weight loss.  Skin:   Patient denies skin rash/ lesion and itching.  Eyes:   Patient denies blurred vision and double vision.  Ears/ Nose/ Throat:   Patient denies sore throat and sinus problems.  Hematologic/Lymphatic:   Patient reports easy bruising. Patient denies swollen glands.  Cardiovascular:   Patient denies leg swelling and chest pains.  Respiratory:   Patient reports cough and shortness of breath.   Endocrine:   Patient denies excessive thirst.  Musculoskeletal:   Patient reports back pain and joint pain.  Neurological:   Patient reports dizziness. Patient denies headaches.  Psychologic:   Patient denies depression and anxiety.   VITAL SIGNS:    Weight 200 lb / 90.72 kg  Height 67 in / 170.18 cm  BP 144/75 mmHg  Pulse 54 /min  BMI 31.3 kg/m   GU PHYSICAL EXAMINATION:    Scrotum: No lesions. No edema. No cysts. No warts.  Epididymides: Right:  no spermatocele, no masses, no cysts, no tenderness, no induration, no enlargement. Left: no spermatocele, no masses, no cysts, no tenderness, no induration, no enlargement.  Testes: No tenderness, no swelling, no enlargement left testes. No tenderness, no swelling, no enlargement right testes. Normal location left testes. Normal location right testes. No mass, no cyst, no varicocele, no hydrocele left testes. No mass, no cyst, no varicocele, no hydrocele right testes.  Urethral Meatus: Normal size. No lesion, no wart, no discharge, no polyp. Normal location.  Penis: Penis uncircumcised, phimosis. No foreskin warts, no cracks. No dorsal peyronie's plaques, no left corporal peyronie's plaques, no right corporal peyronie's plaques, no scarring, no shaft warts. No balanitis, no meatal stenosis.    MULTI-SYSTEM PHYSICAL EXAMINATION:    Constitutional: Well-nourished. No physical deformities. Normally developed. Good grooming.  Neck: Neck symmetrical, not swollen. Normal tracheal position.  Respiratory: No labored breathing, no use of accessory muscles.   Cardiovascular: Normal temperature, normal extremity pulses, no swelling, no varicosities.  Lymphatic: No enlargement of neck, axillae, groin.  Skin: No paleness, no jaundice, no cyanosis. No lesion, no ulcer, no rash.  Neurologic / Psychiatric: Oriented to time, oriented to place, oriented to person. No depression, no anxiety, no agitation.  Gastrointestinal: No mass, no tenderness, no rigidity, non obese abdomen.  Eyes: Normal conjunctivae. Normal eyelids.  Ears, Nose, Mouth, and Throat: Left ear no scars, no lesions, no masses. Right ear no scars, no lesions, no masses. Nose no scars, no lesions, no masses. Normal hearing. Normal lips.  Musculoskeletal: Normal gait and station of head and neck.     PAST DATA REVIEWED:  Source Of History:  Patient  Lab Test Review:   PSA, BUN/Creatinine  Urine Test Review:   Urinalysis  Notes:                      In 1/20 his creatinine was 1.4 with a PSA of 0.39 and a urinalysis that was completely clear.   PROCEDURES:          Urinalysis Dipstick Dipstick Cont'd  Color: Yellow Bilirubin: Neg mg/dL  Appearance: Clear Ketones: Neg mg/dL  Specific Gravity: 1.020 Blood: Neg ery/uL  pH: 6.5 Protein: Neg mg/dL  Glucose: Neg mg/dL Urobilinogen: 0.2 mg/dL    Nitrites: Neg    Leukocyte Esterase: Neg leu/uL    ASSESSMENT/PLAN:     ICD-10 Details  1 GU:   Phimosis - N47.1 After discussing conservative management with possible topical steroid he has elected to proceed with surgical management and therefore will be scheduled for a circumcision.          Notes:   We discussed the surgical management of phimosis with a circumcision. I went over with him the incision used, the risks and complications, the probability of success, the outpatient nature of the procedure as well as the anticipated postoperative course. He understands and has elected to proceed.

## 2018-08-27 ENCOUNTER — Encounter (HOSPITAL_BASED_OUTPATIENT_CLINIC_OR_DEPARTMENT_OTHER): Payer: Self-pay

## 2018-08-27 ENCOUNTER — Other Ambulatory Visit: Payer: Self-pay

## 2018-08-27 LAB — SARS CORONAVIRUS 2 (TAT 6-24 HRS): SARS Coronavirus 2: NEGATIVE

## 2018-08-27 NOTE — Progress Notes (Signed)
SPOKE W/  [name]     SCREENING SYMPTOMS OF COVID 19:   COUGH--NO  RUNNY NOSE--- NO  SORE THROAT---NO  NASAL CONGESTION----NO  SNEEZING----NO  SHORTNESS OF BREATH---YES  DIFFICULTY BREATHING---NO  TEMP >100.0 -----NO  UNEXPLAINED BODY ACHES------NO  CHILLS -------- NO  HEADACHES ---------NO  LOSS OF SMELL/ TASTE --------NO    HAVE YOU OR ANY FAMILY MEMBER TRAVELLED PAST 14 DAYS OUT OF THE   COUNTY---NO STATE----NO COUNTRY----NO  HAVE YOU OR ANY FAMILY MEMBER BEEN EXPOSED TO ANYONE WITH COVID 19? NO

## 2018-08-27 NOTE — Progress Notes (Signed)
Spoke with:  [name] and [name] NPO:  After Midnight, no gum, candy, or mints   Arrival time: 1000AM Labs: Istat 4 (EKG chart/epic) AM medications: Finasteride, Pantoprazole, Ranolazine Pre op orders: Yes Ride home:  [name] (Wife) [phone]

## 2018-08-27 NOTE — Anesthesia Preprocedure Evaluation (Addendum)
Anesthesia Evaluation  Patient identified by MRN, date of birth, ID band Patient awake    Reviewed: Allergy & Precautions, NPO status , Patient's Chart, lab work & pertinent test results  Airway Mallampati: II  TM Distance: >3 FB Neck ROM: Full    Dental  (+) Teeth Intact, Dental Advisory Given   Pulmonary    breath sounds clear to auscultation       Cardiovascular hypertension,  Rhythm:Regular Rate:Normal     Neuro/Psych    GI/Hepatic   Endo/Other    Renal/GU      Musculoskeletal   Abdominal   Peds  Hematology   Anesthesia Other Findings   Reproductive/Obstetrics                            Anesthesia Physical Anesthesia Plan  ASA: III  Anesthesia Plan: General   Post-op Pain Management:    Induction: Intravenous  PONV Risk Score and Plan: Ondansetron  Airway Management Planned: LMA  Additional Equipment:   Intra-op Plan:   Post-operative Plan:   Informed Consent: I have reviewed the patients History and Physical, chart, labs and discussed the procedure including the risks, benefits and alternatives for the proposed anesthesia with the patient or authorized representative who has indicated his/her understanding and acceptance.     Dental advisory given  Plan Discussed with: CRNA and Anesthesiologist  Anesthesia Plan Comments: (Stable CAD S/P CABG 2006, no angina normal EF by echo 2018.  Plan GA with LMA  See PAT note 08/27/2018, Konrad Felix, PA-C  [facility] )      Anesthesia Quick Evaluation

## 2018-08-27 NOTE — Progress Notes (Signed)
Anesthesia Chart Review   Case: [id number] Date/Time: 08/30/18 1145   Procedure: CIRCUMCISION ADULT (N/A ) - ONLY NEEDS 45 MIN   Anesthesia type: General   Pre-op diagnosis: PHIMOSIS   Location: [facility] OR ROOM 3 / [place]   Surgeon: Kathie Rhodes, MD      DISCUSSION: 73 yo never smoker with h/o HTN, CAD (s/p CABG), GERD, HLD, phimosis scheduled for above procedure 08/30/2018 with Dr. Kathie Rhodes.   Pt last seen by cardiologist, Dr. [name], 05/28/2018.  Stable at this visit with 1 year follow up recommended.   Anticipate pt can proceed with planned procedure barring acute status change and after evaluation DOS (SDW). VS: Ht 5\' 7"  (1.702 m)   Wt 90.7 kg   BMI 31.32 kg/m   PROVIDERS: Tisovec, Fransico Him, MD is PCP   Clayton Lefort, MD is Cardiologist  LABS: SDW (all labs ordered are listed, but only abnormal results are displayed)  Labs Reviewed - No data to display   IMAGES:   EKG: EKG 05/28/2018: Sinus bradycardia at the rate of 52 bpm, left atrial abnormality, left axis deviation, left anterior fascicular block. IVCD, LVH.  CV:  Past Medical History:  Diagnosis Date  . Anginal pain ([place])   . Arthritis   . Basal cell carcinoma    back of neck  . Blood transfusion without reported diagnosis 2006   during open heart surgery  . CAD (coronary artery disease)   . Diverticulosis 2013   Noted on Colonoscopy  . Dyspnea    with activity and walking, sometime has SOB walking to mailbox and back to the house  . GERD (gastroesophageal reflux disease)   . History of colon polyps 2008   Noted on Colonoscopy  . History of esophageal stricture 2003   Noted on EGD  . Hyperlipidemia   . Hypertension   . IVCD (intraventricular conduction defect) 05/2018   Noted on EKG  . 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 Left axis deviation 05/2018   Noted on EKG  . 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 LVH (left  ventricular hypertrophy) 05/2018   Noted on EKG  . Phimosis   . Pneumonia   . Tuberculosis 1956  . Wears glasses   . Wheezing     Past Surgical History:  Procedure Laterality Date  . ANGIOPLASTY  2010   3 stents placed  . CATARACT EXTRACTION W/ INTRAOCULAR LENS  IMPLANT, BILATERAL  2012   bilateral  . COLONOSCOPY  2013, 2008  . CORONARY ARTERY BYPASS GRAFT  2006   x4  . ESOPHAGOGASTRODUODENOSCOPY  10/2011  . KNEE ARTHROSCOPY  2001  . [place] SURGERY  1998  . TENDON RECONSTRUCTION Right 10/27/2013   Procedure: RIGHT OPEN TRICEPS REPAIR;  Surgeon: Renette Butters, MD;  Location: [facility];  Service: Orthopedics;  Laterality: Right;    MEDICATIONS: No current facility-administered medications for this encounter.    Marland Kitchen OVER THE COUNTER MEDICATION  . terbinafine (LAMISIL) 250 MG tablet  . acetaminophen (TYLENOL) 500 MG tablet  . aspirin 325 MG tablet  . Calcium Carbonate Antacid (TUMS ULTRA PO)  . Coenzyme Q10 (COQ10) 100 MG CAPS  . CRESTOR 20 MG tablet  . finasteride (PROSCAR) 5 MG tablet  . fish oil-omega-3 fatty acids 1000 MG capsule  . losartan (COZAAR) 25 MG tablet  . Methylcobalamin (B12-ACTIVE PO)  . metoprolol tartrate (LOPRESSOR) 25  MG tablet  . Multiple Vitamin (MULTIVITAMIN) tablet  . Multiple Vitamins-Minerals (PRESERVISION AREDS 2) CAPS  . NITROSTAT 0.4 MG SL tablet  . pantoprazole (PROTONIX) 40 MG tablet  . Polyethyl Glycol-Propyl Glycol (SYSTANE) 0.4-0.3 % GEL ophthalmic gel  . Polyethyl Glycol-Propyl Glycol (SYSTANE) 0.4-0.3 % GEL  . polyethylene glycol (MIRALAX) packet  . ranolazine (RANEXA) 1000 MG SR tablet  . vitamin C (ASCORBIC ACID) 500 MG tablet  . ZETIA 10 MG tablet   Maia Plan [facility] Pre-Surgical Testing [phone] 08/27/18 3:39 PM

## 2018-08-30 ENCOUNTER — Encounter (HOSPITAL_BASED_OUTPATIENT_CLINIC_OR_DEPARTMENT_OTHER)
Admission: RE | Disposition: A | Discharge status: Home / Self Care | Payer: Self-pay | Source: Home / Self Care | Attending: Urology

## 2018-08-30 ENCOUNTER — Ambulatory Visit (HOSPITAL_BASED_OUTPATIENT_CLINIC_OR_DEPARTMENT_OTHER): Payer: PPO | Admitting: Physician Assistant

## 2018-08-30 ENCOUNTER — Encounter (HOSPITAL_BASED_OUTPATIENT_CLINIC_OR_DEPARTMENT_OTHER): Payer: Self-pay | Admitting: *Deleted

## 2018-08-30 ENCOUNTER — Ambulatory Visit (HOSPITAL_BASED_OUTPATIENT_CLINIC_OR_DEPARTMENT_OTHER)
Admission: RE | Admit: 2018-08-30 | Discharge: 2018-08-30 | Disposition: A | Discharge status: Home / Self Care | Payer: PPO | Attending: Urology | Admitting: Urology

## 2018-08-30 ENCOUNTER — Other Ambulatory Visit: Payer: Self-pay

## 2018-08-30 DIAGNOSIS — E119 Type 2 diabetes mellitus without complications: Secondary | ICD-10-CM | POA: Insufficient documentation

## 2018-08-30 DIAGNOSIS — N471 Phimosis: Secondary | ICD-10-CM | POA: Diagnosis not present

## 2018-08-30 DIAGNOSIS — E785 Hyperlipidemia, unspecified: Secondary | ICD-10-CM | POA: Insufficient documentation

## 2018-08-30 DIAGNOSIS — E7800 Pure hypercholesterolemia, unspecified: Secondary | ICD-10-CM | POA: Insufficient documentation

## 2018-08-30 DIAGNOSIS — Z79899 Other long term (current) drug therapy: Secondary | ICD-10-CM | POA: Diagnosis not present

## 2018-08-30 DIAGNOSIS — Z951 Presence of aortocoronary bypass graft: Secondary | ICD-10-CM | POA: Insufficient documentation

## 2018-08-30 DIAGNOSIS — Z7982 Long term (current) use of aspirin: Secondary | ICD-10-CM | POA: Insufficient documentation

## 2018-08-30 DIAGNOSIS — I10 Essential (primary) hypertension: Secondary | ICD-10-CM | POA: Insufficient documentation

## 2018-08-30 DIAGNOSIS — I2510 Atherosclerotic heart disease of native coronary artery without angina pectoris: Secondary | ICD-10-CM | POA: Insufficient documentation

## 2018-08-30 DIAGNOSIS — K219 Gastro-esophageal reflux disease without esophagitis: Secondary | ICD-10-CM | POA: Insufficient documentation

## 2018-08-30 DIAGNOSIS — Z85828 Personal history of other malignant neoplasm of skin: Secondary | ICD-10-CM | POA: Diagnosis not present

## 2018-08-30 DIAGNOSIS — Z955 Presence of coronary angioplasty implant and graft: Secondary | ICD-10-CM | POA: Diagnosis not present

## 2018-08-30 HISTORY — PX: CIRCUMCISION: SHX1350

## 2018-08-30 HISTORY — DX: Presence of spectacles and contact lenses: Z97.3

## 2018-08-30 HISTORY — DX: Dyspnea, unspecified: R06.00

## 2018-08-30 HISTORY — DX: Basal cell carcinoma of skin, unspecified: C44.91

## 2018-08-30 HISTORY — DX: Wheezing: R06.2

## 2018-08-30 HISTORY — DX: Phimosis: N47.1

## 2018-08-30 LAB — POCT I-STAT 4, (NA,K, GLUC, HGB,HCT)
Glucose, Bld: 112 mg/dL — ABNORMAL HIGH (ref 70–99)
HCT: 41 % (ref 39.0–52.0)
Hemoglobin: 13.9 g/dL (ref 13.0–17.0)
Potassium: 4.7 mmol/L (ref 3.5–5.1)
Sodium: 137 mmol/L (ref 135–145)

## 2018-08-30 SURGERY — CIRCUMCISION, ADULT
Anesthesia: General | Site: Penis

## 2018-08-30 MED ORDER — LIDOCAINE HCL (CARDIAC) PF 100 MG/5ML IV SOSY
PREFILLED_SYRINGE | INTRAVENOUS | Status: DC | PRN
  Administered 2018-08-30: 50 mg via INTRAVENOUS

## 2018-08-30 MED ORDER — LIDOCAINE 2% (20 MG/ML) 5 ML SYRINGE
INTRAMUSCULAR | Status: AC
  Filled 2018-08-30: qty 5

## 2018-08-30 MED ORDER — BUPIVACAINE HCL 0.5 % IJ SOLN
INTRAMUSCULAR | Status: DC | PRN
  Administered 2018-08-30: 10 mL

## 2018-08-30 MED ORDER — FENTANYL CITRATE (PF) 100 MCG/2ML IJ SOLN
INTRAMUSCULAR | Status: AC
  Filled 2018-08-30: qty 2

## 2018-08-30 MED ORDER — PROPOFOL 10 MG/ML IV BOLUS
INTRAVENOUS | Status: AC
  Filled 2018-08-30: qty 20

## 2018-08-30 MED ORDER — ONDANSETRON HCL 4 MG/2ML IJ SOLN
INTRAMUSCULAR | Status: AC
  Filled 2018-08-30: qty 2

## 2018-08-30 MED ORDER — PHENYLEPHRINE 40 MCG/ML (10ML) SYRINGE FOR IV PUSH (FOR BLOOD PRESSURE SUPPORT)
PREFILLED_SYRINGE | INTRAVENOUS | Status: AC
  Filled 2018-08-30: qty 10

## 2018-08-30 MED ORDER — ONDANSETRON HCL 4 MG/2ML IJ SOLN
INTRAMUSCULAR | Status: DC | PRN
  Administered 2018-08-30: 4 mg via INTRAVENOUS

## 2018-08-30 MED ORDER — FENTANYL CITRATE (PF) 100 MCG/2ML IJ SOLN
INTRAMUSCULAR | Status: DC | PRN
  Administered 2018-08-30: 75 ug via INTRAVENOUS

## 2018-08-30 MED ORDER — EPHEDRINE SULFATE-NACL 50-0.9 MG/10ML-% IV SOSY
PREFILLED_SYRINGE | INTRAVENOUS | Status: DC | PRN
  Administered 2018-08-30 (×2): 10 mg via INTRAVENOUS
  Administered 2018-08-30: 5 mg via INTRAVENOUS
  Administered 2018-08-30: 10 mg via INTRAVENOUS

## 2018-08-30 MED ORDER — FENTANYL CITRATE (PF) 100 MCG/2ML IJ SOLN
25.0000 ug | INTRAMUSCULAR | Status: DC | PRN
  Filled 2018-08-30: qty 1

## 2018-08-30 MED ORDER — DEXAMETHASONE SODIUM PHOSPHATE 10 MG/ML IJ SOLN
INTRAMUSCULAR | Status: AC
  Filled 2018-08-30: qty 1

## 2018-08-30 MED ORDER — SODIUM CHLORIDE 0.9 % IR SOLN
Status: DC | PRN
  Administered 2018-08-30: 500 mL

## 2018-08-30 MED ORDER — DEXAMETHASONE SODIUM PHOSPHATE 10 MG/ML IJ SOLN
INTRAMUSCULAR | Status: DC | PRN
  Administered 2018-08-30: 5 mg via INTRAVENOUS

## 2018-08-30 MED ORDER — PHENYLEPHRINE 40 MCG/ML (10ML) SYRINGE FOR IV PUSH (FOR BLOOD PRESSURE SUPPORT)
PREFILLED_SYRINGE | INTRAVENOUS | Status: DC | PRN
  Administered 2018-08-30 (×2): 80 ug via INTRAVENOUS

## 2018-08-30 MED ORDER — BACITRACIN-NEOMYCIN-POLYMYXIN 400-5-5000 EX OINT
TOPICAL_OINTMENT | CUTANEOUS | Status: DC | PRN
  Administered 2018-08-30: 1 via TOPICAL

## 2018-08-30 MED ORDER — ONDANSETRON HCL 4 MG/2ML IJ SOLN
4.0000 mg | Freq: Once | INTRAMUSCULAR | Status: DC | PRN
  Filled 2018-08-30: qty 2

## 2018-08-30 MED ORDER — EPHEDRINE 5 MG/ML INJ
INTRAVENOUS | Status: AC
  Filled 2018-08-30: qty 10

## 2018-08-30 MED ORDER — CEFAZOLIN SODIUM-DEXTROSE 2-4 GM/100ML-% IV SOLN
2.0000 g | Freq: Once | INTRAVENOUS | Status: AC
  Administered 2018-08-30: 2 g via INTRAVENOUS
  Filled 2018-08-30: qty 100

## 2018-08-30 MED ORDER — CEFAZOLIN SODIUM-DEXTROSE 2-4 GM/100ML-% IV SOLN
INTRAVENOUS | Status: AC
  Filled 2018-08-30: qty 100

## 2018-08-30 MED ORDER — LACTATED RINGERS IV SOLN
INTRAVENOUS | Status: DC
  Administered 2018-08-30 (×2): via INTRAVENOUS
  Filled 2018-08-30: qty 1000

## 2018-08-30 MED ORDER — HYDROCODONE-ACETAMINOPHEN 10-325 MG PO TABS: 1.0000 | ORAL_TABLET | ORAL | 0 refills | Status: DC | PRN

## 2018-08-30 MED ORDER — PROPOFOL 10 MG/ML IV BOLUS
INTRAVENOUS | Status: DC | PRN
  Administered 2018-08-30: 170 mg via INTRAVENOUS

## 2018-08-30 SURGICAL SUPPLY — 36 items
BANDAGE CO FLEX L/F 1IN X 5YD (GAUZE/BANDAGES/DRESSINGS) IMPLANT
BANDAGE CO FLEX L/F 2IN X 5YD (GAUZE/BANDAGES/DRESSINGS) ×2 IMPLANT
BLADE CLIPPER SENSICLIP SURGIC (BLADE) IMPLANT
BLADE SURG 15 STRL LF DISP TIS (BLADE) ×1 IMPLANT
BLADE SURG 15 STRL SS (BLADE) ×1
BNDG COHESIVE 3X5 TAN STRL LF (GAUZE/BANDAGES/DRESSINGS) ×1 IMPLANT
BNDG CONFORM 2 STRL LF (GAUZE/BANDAGES/DRESSINGS) ×1 IMPLANT
COVER BACK TABLE 60X90IN (DRAPES) ×2 IMPLANT
COVER MAYO STAND STRL (DRAPES) ×2 IMPLANT
COVER WAND RF STERILE (DRAPES) ×2 IMPLANT
DRAIN PENROSE 18X1/4 LTX STRL (WOUND CARE) IMPLANT
DRAPE LAPAROTOMY 100X72 PEDS (DRAPES) ×2 IMPLANT
ELECT REM PT RETURN 9FT ADLT (ELECTROSURGICAL) ×2
ELECTRODE REM PT RTRN 9FT ADLT (ELECTROSURGICAL) ×1 IMPLANT
GAUZE SPONGE 4X4 12PLY STRL LF (GAUZE/BANDAGES/DRESSINGS) ×1 IMPLANT
GLOVE BIO SURGEON STRL SZ8 (GLOVE) ×2 IMPLANT
GOWN STRL REUS W/ TWL LRG LVL3 (GOWN DISPOSABLE) ×1 IMPLANT
GOWN STRL REUS W/ TWL XL LVL3 (GOWN DISPOSABLE) ×1 IMPLANT
GOWN STRL REUS W/TWL LRG LVL3 (GOWN DISPOSABLE) ×1
GOWN STRL REUS W/TWL XL LVL3 (GOWN DISPOSABLE) ×1
IV NS IRRIG 3000ML ARTHROMATIC (IV SOLUTION) IMPLANT
KIT TURNOVER CYSTO (KITS) ×2 IMPLANT
NEEDLE HYPO 22GX1.5 SAFETY (NEEDLE) ×2 IMPLANT
NS IRRIG 500ML POUR BTL (IV SOLUTION) IMPLANT
PACK BASIN DAY SURGERY FS (CUSTOM PROCEDURE TRAY) ×2 IMPLANT
PENCIL BUTTON HOLSTER BLD 10FT (ELECTRODE) ×2 IMPLANT
SUT CHROMIC 3 0 SH 27 (SUTURE) ×4 IMPLANT
SUT CHROMIC 4 0 RB 1X27 (SUTURE) IMPLANT
SUT CHROMIC 5 0 RB 1 27 (SUTURE) ×2 IMPLANT
SYR CONTROL 10ML LL (SYRINGE) ×2 IMPLANT
TOWEL OR 17X26 10 PK STRL BLUE (TOWEL DISPOSABLE) ×2 IMPLANT
TRAY DSU PREP LF (CUSTOM PROCEDURE TRAY) ×2 IMPLANT
TUBE CONNECTING 12X1/4 (SUCTIONS) ×2 IMPLANT
WATER STERILE IRR 3000ML UROMA (IV SOLUTION) IMPLANT
WATER STERILE IRR 500ML POUR (IV SOLUTION) ×2 IMPLANT
YANKAUER SUCT BULB TIP NO VENT (SUCTIONS) ×2 IMPLANT

## 2018-08-30 NOTE — Progress Notes (Signed)
Upon entering phase 2, pt noted to have blood dripping from penis.  Pressure held w 4x4's x 1 min.  No drainage noted after wards.  Pressure dressing applied w folded 4x4's and coban. Ice pack applied.  Explained to pt that loosing the dressing upon stand is not unsual and that it may happen again. Pt instructed if it happens at home to apply pressure w wash clot, gauze if availbe. Apply new drsg and call Dr. Karsten Ro to inform him of occurrence. Pt verbalized his understanding.

## 2018-08-30 NOTE — Discharge Instructions (Signed)
[place] - Preparing for Surgery  Before surgery, you can play an important role.  Because skin is not sterile, your skin needs to be as free of germs as possible.  You can reduce the number of germs on you skin by washing with CHG (chlorahexidine gluconate) soap before surgery.  CHG is an antiseptic cleaner which kills germs and bonds with the skin to continue killing germs even after washing.  Oral Hygiene is also important in reducing the risk of infection.  Remember to brush your teeth with your regular toothpaste the morning of surgery.  Please DO NOT use if you have an allergy to CHG or antibacterial soaps.  If your skin becomes reddened/irritated stop using the CHG and inform your nurse when you arrive at Short Stay.  Do not shave (including legs and underarms) for at least 48 hours prior to the first CHG shower.  You may shave your face.  Please follow these instructions carefully:   1.  Shower with CHG Soap the night before surgery and the morning of Surgery.  2.  If you choose to wash your hair, wash your hair first as usual with your normal shampoo.  3.  After you shampoo, rinse your hair and body thoroughly to remove the shampoo. 4.  Use CHG as you would any other liquid soap.  You can apply chg directly to the skin and wash gently with a      scrungie or washcloth.           5.  Apply the CHG Soap to your body ONLY FROM THE NECK DOWN.   Do not use on open wounds or open sores. Avoid contact with your eyes, ears, mouth and genitals (private parts).  Wash genitals (private parts) with your normal soap.  6.  Wash thoroughly, paying special attention to the area where your surgery will be performed.  7.  Thoroughly rinse your body with warm water from the neck down.  8.  DO NOT shower/wash with your normal soap after using and rinsing off the CHG Soap.  9.  Pat yourself dry with a clean towel.            10.  Wear clean pajamas.            11.  Place clean sheets on your bed the  night of your first shower and do not sleep with pets.  Day of Surgery  Do not apply any lotions/deoderants the morning of surgery.   Please wear clean clothes to the hospital/surgery center. Remember to brush your teeth with toothpaste.   Post Anesthesia Home Care Instructions  Activity: Get plenty of rest for the remainder of the day. A responsible adult should stay with you for 24 hours following the procedure.  For the next 24 hours, DO NOT: -Drive a car -Paediatric nurse -Drink alcoholic beverages -Take any medication unless instructed by your physician -Make any legal decisions or sign important papers.  Meals: Start with liquid foods such as gelatin or soup. Progress to regular foods as tolerated. Avoid greasy, spicy, heavy foods. If nausea and/or vomiting occur, drink only clear liquids until the nausea and/or vomiting subsides. Call your physician if vomiting continues.  Special Instructions/Symptoms: Your throat may feel dry or sore from the anesthesia or the breathing tube placed in your throat during surgery. If this causes discomfort, gargle with warm salt water. The discomfort should disappear within 24 hours.  If you had a scopolamine patch placed behind your  ear for the management of post- operative nausea and/or vomiting:  1. The medication in the patch is effective for 72 hours, after which it should be removed.  Wrap patch in a tissue and discard in the trash. Wash hands thoroughly with soap and water. 2. You may remove the patch earlier than 72 hours if you experience unpleasant side effects which may include dry mouth, dizziness or visual disturbances. 3. Avoid touching the patch. Wash your hands with soap and water after contact with the patch.   Postoperative instructions for circumcision  Wound:  In most cases your incision will have absorbable sutures that run along the course of your incision and will dissolve within the first 10-20 days. Some will fall  out even earlier. Expect some redness as the sutures dissolved but this should occur only around the sutures. If there is generalized redness, especially with increasing pain or swelling, let us know. The penis will very likely get "black and blue" as the blood in the tissues spread. Sometimes the whole penis will turn colors. The black and blue is followed by a yellow and brown color. In time, all the discoloration will go away.  Diet:  You may return to your normal diet within 24 hours following your surgery. You may note some mild nausea and possibly vomiting the first 6-8 hours following surgery. This is usually due to the side effects of anesthesia, and will disappear quite soon. I would suggest clear liquids and a very light meal the first evening following your surgery.  Activity:  Your physical activity should be restricted the first 48 hours. During that time you should remain relatively inactive, moving about only when necessary. During the first 7-10 days following surgery he should avoid lifting any heavy objects (anything greater than 15 pounds), and avoid strenuous exercise. If you work, ask [place] specifically about your restrictions, both for work and home. We will write a note to your employer if needed.  Ice packs can be placed on and off over the penis for the first 48 hours to help relieve the pain and keep the swelling down. Frozen peas or corn in a ZipLock bag can be frozen, used and re-frozen. Fifteen minutes on and 15 minutes off is a reasonable schedule.   Hygiene:  You may shower 48 hours after your surgery. Tub bathing should be restricted until the seventh day.  Medication:  You will be sent home with some type of pain medication. In many cases you will be sent home with a narcotic pain pill (Vicodin or Tylox). If the pain is not too bad, you may take either Tylenol (acetaminophen) or Advil (ibuprofen) which contain no narcotic agents, and might be tolerated a little better,  with fewer side effects. If the pain medication you are sent home with does not control the pain, you will have to let us know. Some narcotic pain medications cannot be given or refilled by a phone call to a pharmacy.  Problems you should report to [place]:   Fever of 101.0 degrees Fahrenheit or greater.  Moderate or severe swelling under the skin incision or involving the scrotum.  Drug reaction such as hives, a rash, nausea or vomiting.   Pain unrelieved by pain meds  Inability to void

## 2018-08-30 NOTE — Anesthesia Postprocedure Evaluation (Signed)
Anesthesia Post Note  Patient: [name]  Procedure(s) Performed: CIRCUMCISION ADULT (N/A Penis)     Patient location during evaluation: PACU Anesthesia Type: General Level of consciousness: awake and alert Pain management: pain level controlled Vital Signs Assessment: post-procedure vital signs reviewed and stable Respiratory status: spontaneous breathing, nonlabored ventilation, respiratory function stable and patient connected to nasal cannula oxygen Cardiovascular status: blood pressure returned to baseline and stable Postop Assessment: no apparent nausea or vomiting Anesthetic complications: no    Last Vitals:  Vitals:   08/30/18 1207 08/30/18 1342  BP:  133/62  Pulse: (!) 51 (!) 57  Resp: 16 18  Temp:  36.6 C  SpO2: 96% 100%    Last Pain:  Vitals:   08/30/18 1342  TempSrc: Oral  PainSc:                  Jeanet Lupe COKER

## 2018-08-30 NOTE — Op Note (Signed)
PATIENT:  [name]  PRE-OPERATIVE DIAGNOSIS: Phimosis  POST-OPERATIVE DIAGNOSIS: Same  PROCEDURE: Circumcision  SURGEON:  Claybon Jabs  INDICATION: [name] is a 73 year old male with symptomatic phimosis having developed symptoms approximately 1 year ago and noting slow progression with difficulty retracting his foreskin and pain with retraction.  He does have diabetes.  We discussed the options for management and he is elected to proceed with circumcision  ANESTHESIA:  General  EBL:  Minimal  DRAINS: None  LOCAL MEDICATIONS USED: 1/2 percent plain Marcaine  SPECIMEN: None  Description of procedure: After informed consent the patient was taken to the operating room and placed on the table in a supine position. General anesthesia was then administered. Once fully anesthetized the genitalia were sterilely prepped and draped in standard fashion. An official timeout was then performed.  I first performed a dorsal penile block in the standard fashion injecting half percent plain Marcaine at the base of the penis.  I then marked the location of the corona on the shaft skin with a surgical marker and retracted the foreskin.  The circumcising incision was made approximately 4 mm from the corona circumferentially.  The foreskin was then replaced in its normal anatomic position and I then incised along the previous marking.  The foreskin was then sharply excised and bleeding points were cauterized.  The frenulum had retracted up somewhat so I reapproximated the edges with interrupted 5-0 chromic and then placed a 3-0 chromic U stitch at the 6 o'clock position as well as a second 3-0 chromic at the 12 o'clock position.  The skin edges were then reapproximated by running the 3-0 chromic sutures on each side.  I applied antibiotic ointment, folded 4 x 4 gauze gently wrapped around the penis and a Coban.  The patient tolerated procedure well with no intraoperative complications.  PLAN OF  CARE: Discharge to home after PACU  PATIENT DISPOSITION:  PACU - hemodynamically stable.

## 2018-08-30 NOTE — Anesthesia Procedure Notes (Signed)
Procedure Name: LMA Insertion Date/Time: 08/30/2018 10:48 AM Performed by: Raenette Rover, CRNA Pre-anesthesia Checklist: Patient identified, Emergency Drugs available, Suction available and Patient being monitored Patient Re-evaluated:Patient Re-evaluated prior to induction Oxygen Delivery Method: Circle system utilized Preoxygenation: Pre-oxygenation with 100% oxygen Induction Type: IV induction LMA: LMA inserted LMA Size: 4.0 Number of attempts: 1 Placement Confirmation: positive ETCO2,  CO2 detector and breath sounds checked- equal and bilateral Tube secured with: Tape Dental Injury: Teeth and Oropharynx as per pre-operative assessment

## 2018-08-30 NOTE — Transfer of Care (Signed)
Immediate Anesthesia Transfer of Care Note  Patient: [name]  Procedure(s) Performed: CIRCUMCISION ADULT (N/A Penis)  Patient Location: PACU  Anesthesia Type:General  Level of Consciousness: drowsy and patient cooperative  Airway & Oxygen Therapy: Patient Spontanous Breathing and Patient connected to nasal cannula oxygen  Post-op Assessment: Report given to RN and Post -op Vital signs reviewed and stable  Post vital signs: Reviewed and stable  Last Vitals:  Vitals Value Taken Time  BP 144/67 08/30/18 1133  Temp    Pulse 53 08/30/18 1134  Resp 11 08/30/18 1134  SpO2 95 % 08/30/18 1134  Vitals shown include unvalidated device data.  Last Pain:  Vitals:   08/30/18 0951  TempSrc: Oral  PainSc: 0-No pain      Patients Stated Pain Goal: 6 (87/27/61 8485)  Complications: No apparent anesthesia complications

## 2018-08-31 ENCOUNTER — Encounter (HOSPITAL_BASED_OUTPATIENT_CLINIC_OR_DEPARTMENT_OTHER): Payer: Self-pay | Admitting: Urology

## 2018-09-03 ENCOUNTER — Other Ambulatory Visit: Payer: Self-pay | Admitting: Cardiology

## 2018-09-03 NOTE — Telephone Encounter (Signed)
Please fill

## 2018-09-14 DIAGNOSIS — N5201 Erectile dysfunction due to arterial insufficiency: Secondary | ICD-10-CM | POA: Diagnosis not present

## 2018-09-14 DIAGNOSIS — N471 Phimosis: Secondary | ICD-10-CM | POA: Diagnosis not present

## 2018-09-27 DIAGNOSIS — H40023 Open angle with borderline findings, high risk, bilateral: Secondary | ICD-10-CM | POA: Diagnosis not present

## 2018-10-12 DIAGNOSIS — N5201 Erectile dysfunction due to arterial insufficiency: Secondary | ICD-10-CM | POA: Diagnosis not present

## 2018-10-20 DIAGNOSIS — D692 Other nonthrombocytopenic purpura: Secondary | ICD-10-CM | POA: Diagnosis not present

## 2018-10-20 DIAGNOSIS — R7302 Impaired glucose tolerance (oral): Secondary | ICD-10-CM | POA: Diagnosis not present

## 2018-10-20 DIAGNOSIS — I272 Pulmonary hypertension, unspecified: Secondary | ICD-10-CM | POA: Diagnosis not present

## 2018-10-20 DIAGNOSIS — N183 Chronic kidney disease, stage 3 (moderate): Secondary | ICD-10-CM | POA: Diagnosis not present

## 2018-10-20 DIAGNOSIS — I209 Angina pectoris, unspecified: Secondary | ICD-10-CM | POA: Diagnosis not present

## 2018-10-20 DIAGNOSIS — H9190 Unspecified hearing loss, unspecified ear: Secondary | ICD-10-CM | POA: Diagnosis not present

## 2018-10-20 DIAGNOSIS — I130 Hypertensive heart and chronic kidney disease with heart failure and stage 1 through stage 4 chronic kidney disease, or unspecified chronic kidney disease: Secondary | ICD-10-CM | POA: Diagnosis not present

## 2018-10-20 DIAGNOSIS — E7800 Pure hypercholesterolemia, unspecified: Secondary | ICD-10-CM | POA: Diagnosis not present

## 2018-10-20 DIAGNOSIS — I519 Heart disease, unspecified: Secondary | ICD-10-CM | POA: Diagnosis not present

## 2018-10-20 DIAGNOSIS — Z9861 Coronary angioplasty status: Secondary | ICD-10-CM | POA: Diagnosis not present

## 2018-10-20 DIAGNOSIS — E669 Obesity, unspecified: Secondary | ICD-10-CM | POA: Diagnosis not present

## 2018-10-20 DIAGNOSIS — E291 Testicular hypofunction: Secondary | ICD-10-CM | POA: Diagnosis not present

## 2018-10-26 DIAGNOSIS — Z85828 Personal history of other malignant neoplasm of skin: Secondary | ICD-10-CM | POA: Diagnosis not present

## 2018-10-26 DIAGNOSIS — B351 Tinea unguium: Secondary | ICD-10-CM | POA: Diagnosis not present

## 2018-12-06 DIAGNOSIS — M25562 Pain in left knee: Secondary | ICD-10-CM | POA: Diagnosis not present

## 2018-12-06 DIAGNOSIS — M7041 Prepatellar bursitis, right knee: Secondary | ICD-10-CM | POA: Diagnosis not present

## 2018-12-16 DIAGNOSIS — R509 Fever, unspecified: Secondary | ICD-10-CM | POA: Diagnosis not present

## 2018-12-16 DIAGNOSIS — Z20828 Contact with and (suspected) exposure to other viral communicable diseases: Secondary | ICD-10-CM | POA: Diagnosis not present

## 2018-12-17 DIAGNOSIS — Z20828 Contact with and (suspected) exposure to other viral communicable diseases: Secondary | ICD-10-CM | POA: Diagnosis not present

## 2018-12-17 DIAGNOSIS — R509 Fever, unspecified: Secondary | ICD-10-CM | POA: Diagnosis not present

## 2018-12-20 ENCOUNTER — Other Ambulatory Visit: Payer: Self-pay | Admitting: Cardiology

## 2018-12-24 ENCOUNTER — Other Ambulatory Visit: Payer: Self-pay

## 2018-12-24 MED ORDER — EZETIMIBE 10 MG PO TABS: 5.0000 mg | ORAL_TABLET | Freq: Every evening | ORAL | 1 refills | Status: DC

## 2018-12-27 IMAGING — US US ABDOMEN LIMITED
1 series · 14 of 25 positions shown · non-contrast
Comparison: None.

CLINICAL DATA: Chronic right upper quadrant abdominal pain, nausea.

EXAM:
US ABDOMEN LIMITED - RIGHT UPPER QUADRANT

[Series 1: us abdomen limited · 0.26mm/px · 14 of 44 slices shown]
[im 1/44]
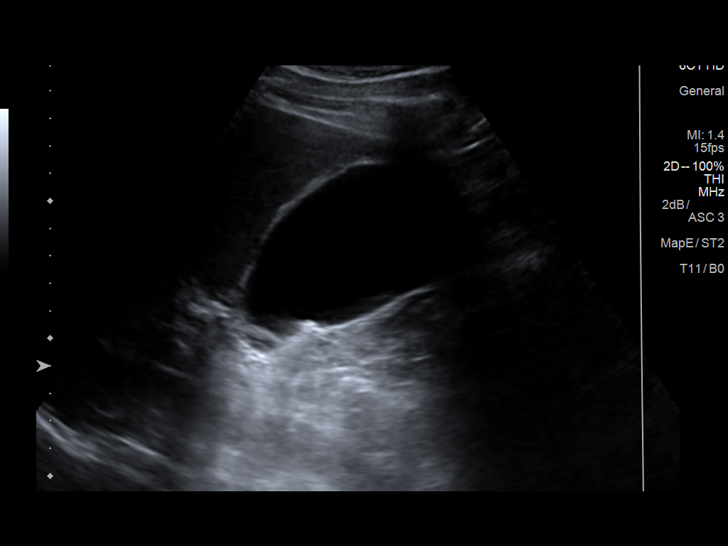
[im 4/44]
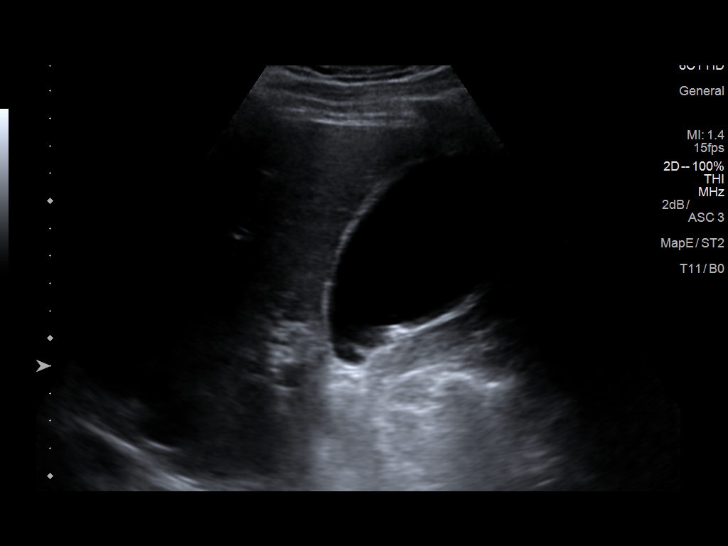
[im 8/44]
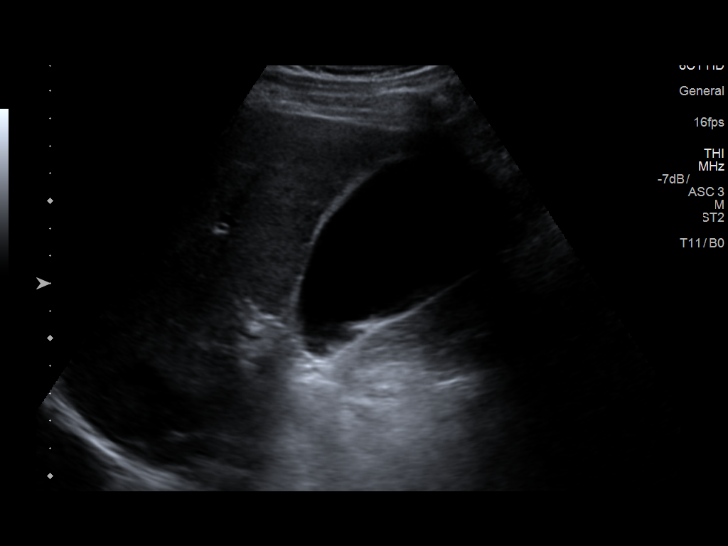
[im 11/44]
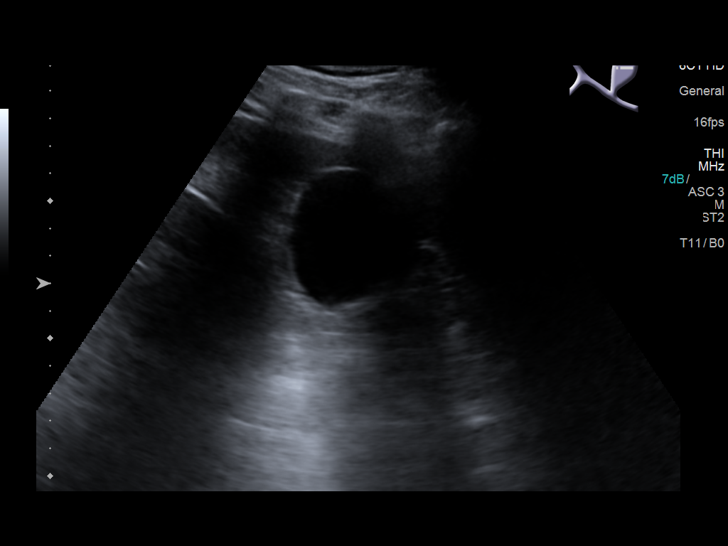
[im 15/44]
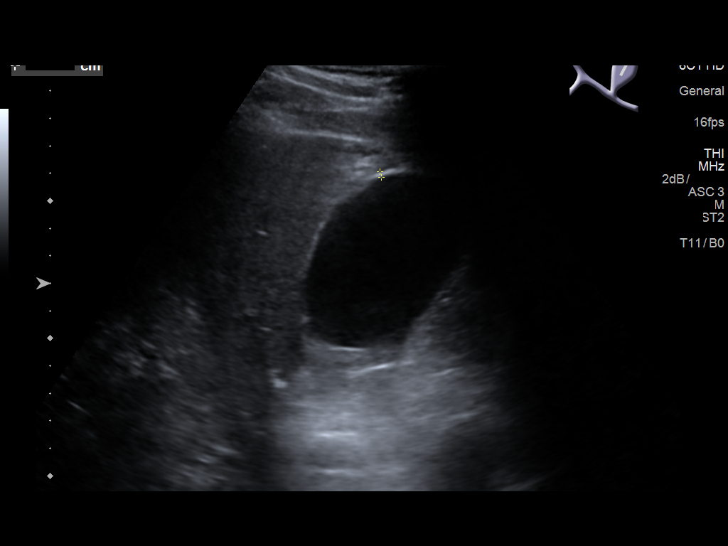
[im 17/44]
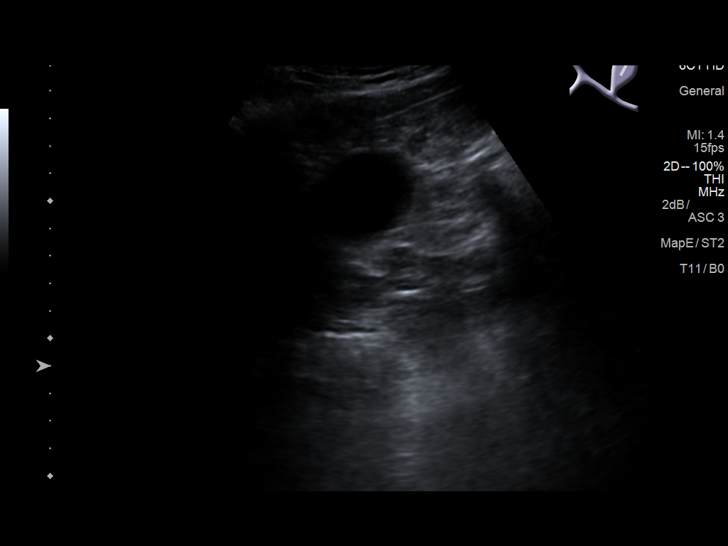
[im 20/44]
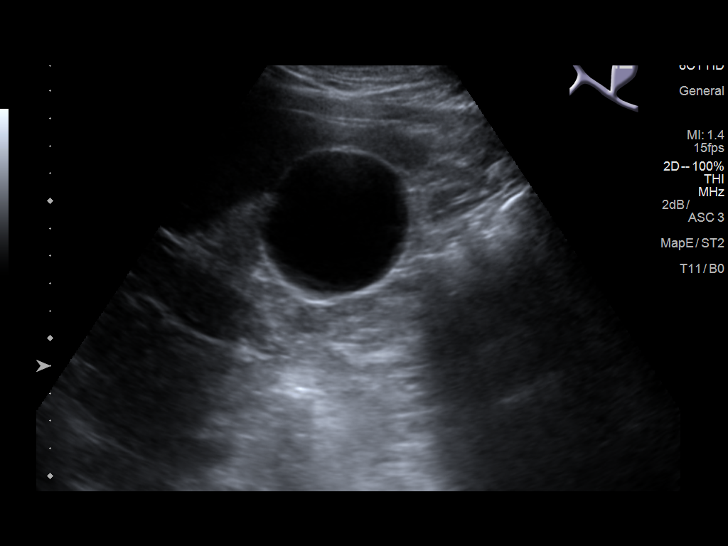
[im 24/44]
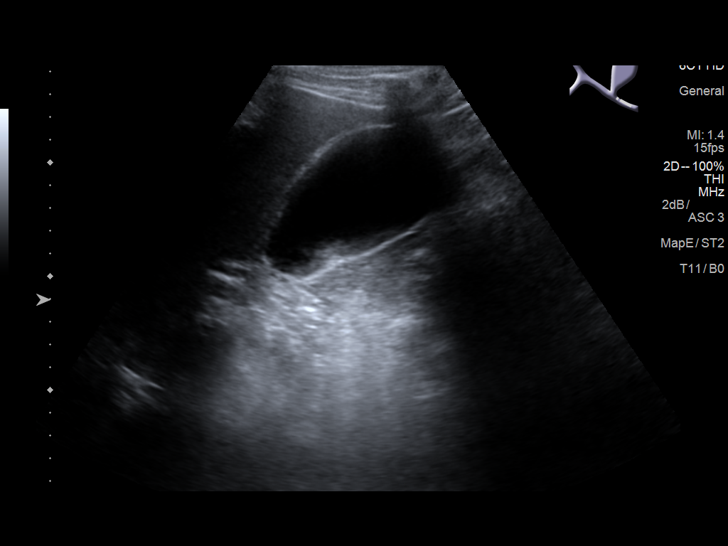
[im 27/44]
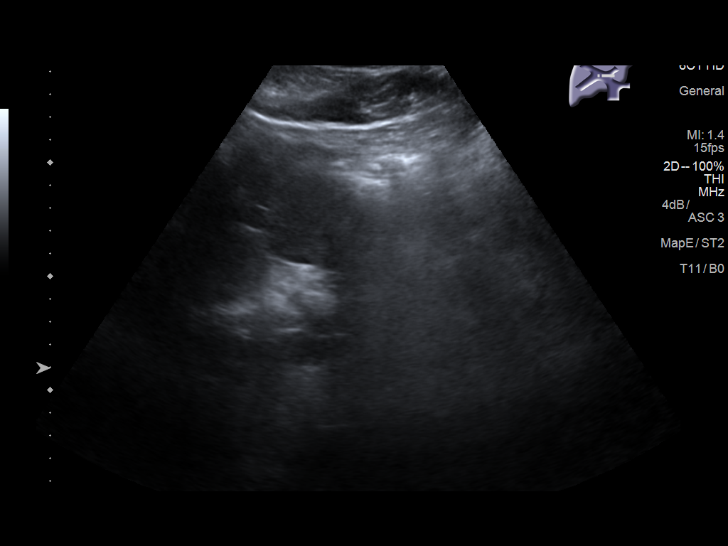
[im 29/44]
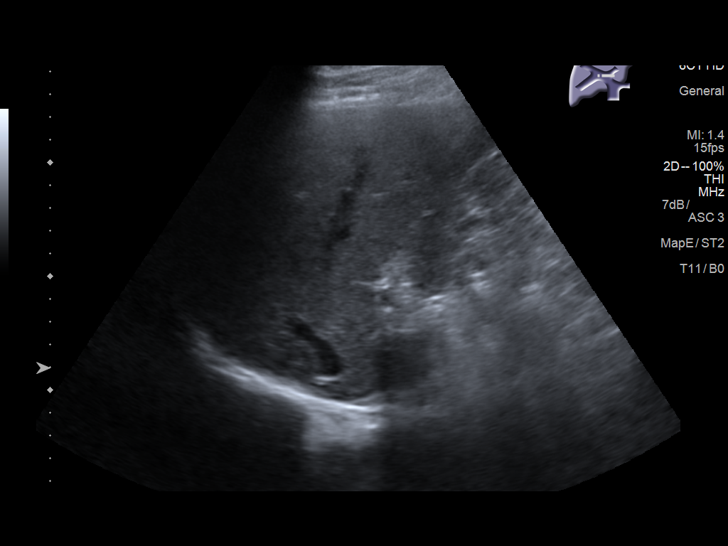
[im 33/44]
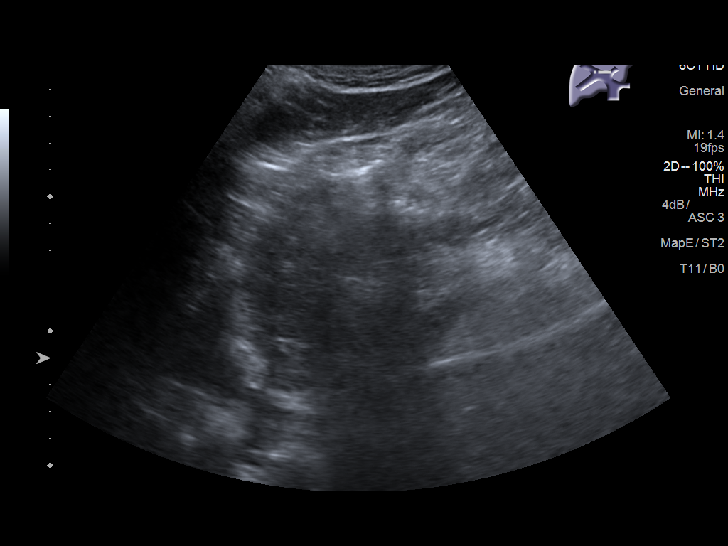
[im 36/44]
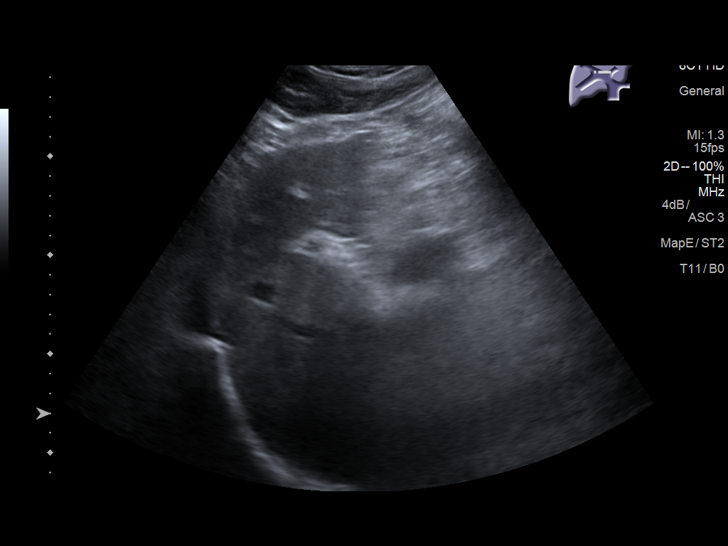
[im 40/44]
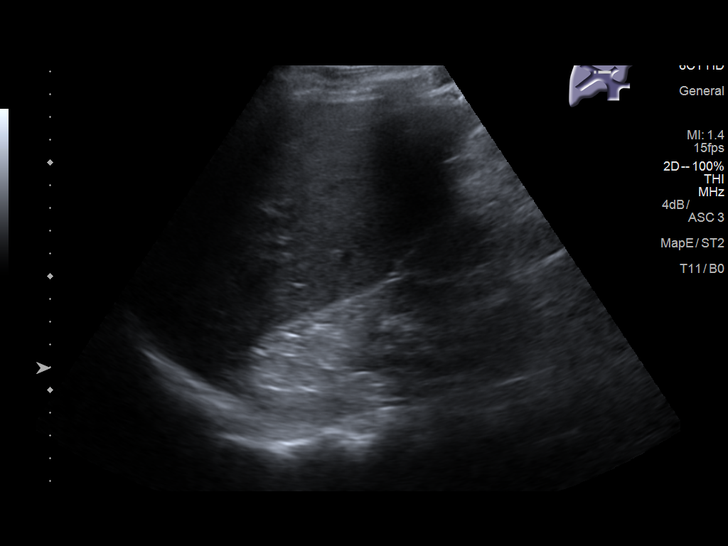
[im 44/44]
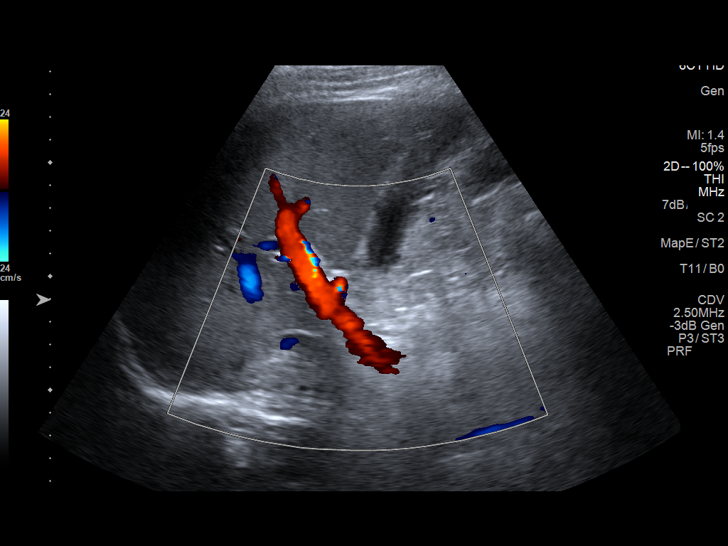

[14 of 25 positions shown; findings below may reference images not displayed]

FINDINGS: Gallbladder:

No gallstones or wall thickening visualized. No sonographic Murphy
sign noted by sonographer.

Common bile duct:

Diameter: 6.2 mm which is within normal limits.

Liver:

No focal lesion identified. Within normal limits in parenchymal
echogenicity.
IMPRESSION: No abnormality seen in the right upper quadrant of the abdomen.

## 2019-01-24 DIAGNOSIS — M5031 Other cervical disc degeneration,  high cervical region: Secondary | ICD-10-CM | POA: Diagnosis not present

## 2019-01-24 DIAGNOSIS — Q72891 Other reduction defects of right lower limb: Secondary | ICD-10-CM | POA: Diagnosis not present

## 2019-01-24 DIAGNOSIS — M47817 Spondylosis without myelopathy or radiculopathy, lumbosacral region: Secondary | ICD-10-CM | POA: Diagnosis not present

## 2019-01-24 DIAGNOSIS — M5417 Radiculopathy, lumbosacral region: Secondary | ICD-10-CM | POA: Diagnosis not present

## 2019-01-24 DIAGNOSIS — M9903 Segmental and somatic dysfunction of lumbar region: Secondary | ICD-10-CM | POA: Diagnosis not present

## 2019-01-24 DIAGNOSIS — M9904 Segmental and somatic dysfunction of sacral region: Secondary | ICD-10-CM | POA: Diagnosis not present

## 2019-01-24 DIAGNOSIS — M5136 Other intervertebral disc degeneration, lumbar region: Secondary | ICD-10-CM | POA: Diagnosis not present

## 2019-01-24 DIAGNOSIS — M5137 Other intervertebral disc degeneration, lumbosacral region: Secondary | ICD-10-CM | POA: Diagnosis not present

## 2019-01-24 DIAGNOSIS — M9905 Segmental and somatic dysfunction of pelvic region: Secondary | ICD-10-CM | POA: Diagnosis not present

## 2019-01-24 DIAGNOSIS — M50322 Other cervical disc degeneration at C5-C6 level: Secondary | ICD-10-CM | POA: Diagnosis not present

## 2019-01-24 DIAGNOSIS — M47816 Spondylosis without myelopathy or radiculopathy, lumbar region: Secondary | ICD-10-CM | POA: Diagnosis not present

## 2019-01-24 DIAGNOSIS — M9901 Segmental and somatic dysfunction of cervical region: Secondary | ICD-10-CM | POA: Diagnosis not present

## 2019-01-25 DIAGNOSIS — M5136 Other intervertebral disc degeneration, lumbar region: Secondary | ICD-10-CM | POA: Diagnosis not present

## 2019-01-25 DIAGNOSIS — M9901 Segmental and somatic dysfunction of cervical region: Secondary | ICD-10-CM | POA: Diagnosis not present

## 2019-01-25 DIAGNOSIS — M9905 Segmental and somatic dysfunction of pelvic region: Secondary | ICD-10-CM | POA: Diagnosis not present

## 2019-01-25 DIAGNOSIS — M9904 Segmental and somatic dysfunction of sacral region: Secondary | ICD-10-CM | POA: Diagnosis not present

## 2019-01-25 DIAGNOSIS — M5137 Other intervertebral disc degeneration, lumbosacral region: Secondary | ICD-10-CM | POA: Diagnosis not present

## 2019-01-25 DIAGNOSIS — M5417 Radiculopathy, lumbosacral region: Secondary | ICD-10-CM | POA: Diagnosis not present

## 2019-01-25 DIAGNOSIS — M5031 Other cervical disc degeneration,  high cervical region: Secondary | ICD-10-CM | POA: Diagnosis not present

## 2019-01-25 DIAGNOSIS — Q72891 Other reduction defects of right lower limb: Secondary | ICD-10-CM | POA: Diagnosis not present

## 2019-01-25 DIAGNOSIS — M47816 Spondylosis without myelopathy or radiculopathy, lumbar region: Secondary | ICD-10-CM | POA: Diagnosis not present

## 2019-01-25 DIAGNOSIS — M50322 Other cervical disc degeneration at C5-C6 level: Secondary | ICD-10-CM | POA: Diagnosis not present

## 2019-01-25 DIAGNOSIS — M47817 Spondylosis without myelopathy or radiculopathy, lumbosacral region: Secondary | ICD-10-CM | POA: Diagnosis not present

## 2019-01-25 DIAGNOSIS — M9903 Segmental and somatic dysfunction of lumbar region: Secondary | ICD-10-CM | POA: Diagnosis not present

## 2019-01-27 DIAGNOSIS — M9904 Segmental and somatic dysfunction of sacral region: Secondary | ICD-10-CM | POA: Diagnosis not present

## 2019-01-27 DIAGNOSIS — M5137 Other intervertebral disc degeneration, lumbosacral region: Secondary | ICD-10-CM | POA: Diagnosis not present

## 2019-01-27 DIAGNOSIS — M50322 Other cervical disc degeneration at C5-C6 level: Secondary | ICD-10-CM | POA: Diagnosis not present

## 2019-01-27 DIAGNOSIS — M47817 Spondylosis without myelopathy or radiculopathy, lumbosacral region: Secondary | ICD-10-CM | POA: Diagnosis not present

## 2019-01-27 DIAGNOSIS — M9903 Segmental and somatic dysfunction of lumbar region: Secondary | ICD-10-CM | POA: Diagnosis not present

## 2019-01-27 DIAGNOSIS — M9905 Segmental and somatic dysfunction of pelvic region: Secondary | ICD-10-CM | POA: Diagnosis not present

## 2019-01-27 DIAGNOSIS — M5136 Other intervertebral disc degeneration, lumbar region: Secondary | ICD-10-CM | POA: Diagnosis not present

## 2019-01-27 DIAGNOSIS — M47816 Spondylosis without myelopathy or radiculopathy, lumbar region: Secondary | ICD-10-CM | POA: Diagnosis not present

## 2019-01-27 DIAGNOSIS — M9901 Segmental and somatic dysfunction of cervical region: Secondary | ICD-10-CM | POA: Diagnosis not present

## 2019-01-27 DIAGNOSIS — M5417 Radiculopathy, lumbosacral region: Secondary | ICD-10-CM | POA: Diagnosis not present

## 2019-01-27 DIAGNOSIS — M5031 Other cervical disc degeneration,  high cervical region: Secondary | ICD-10-CM | POA: Diagnosis not present

## 2019-01-27 DIAGNOSIS — Q72891 Other reduction defects of right lower limb: Secondary | ICD-10-CM | POA: Diagnosis not present

## 2019-01-31 DIAGNOSIS — M47817 Spondylosis without myelopathy or radiculopathy, lumbosacral region: Secondary | ICD-10-CM | POA: Diagnosis not present

## 2019-01-31 DIAGNOSIS — M9903 Segmental and somatic dysfunction of lumbar region: Secondary | ICD-10-CM | POA: Diagnosis not present

## 2019-01-31 DIAGNOSIS — M50322 Other cervical disc degeneration at C5-C6 level: Secondary | ICD-10-CM | POA: Diagnosis not present

## 2019-01-31 DIAGNOSIS — Q72891 Other reduction defects of right lower limb: Secondary | ICD-10-CM | POA: Diagnosis not present

## 2019-01-31 DIAGNOSIS — M9905 Segmental and somatic dysfunction of pelvic region: Secondary | ICD-10-CM | POA: Diagnosis not present

## 2019-01-31 DIAGNOSIS — M9904 Segmental and somatic dysfunction of sacral region: Secondary | ICD-10-CM | POA: Diagnosis not present

## 2019-01-31 DIAGNOSIS — M47816 Spondylosis without myelopathy or radiculopathy, lumbar region: Secondary | ICD-10-CM | POA: Diagnosis not present

## 2019-01-31 DIAGNOSIS — M5031 Other cervical disc degeneration,  high cervical region: Secondary | ICD-10-CM | POA: Diagnosis not present

## 2019-01-31 DIAGNOSIS — M5137 Other intervertebral disc degeneration, lumbosacral region: Secondary | ICD-10-CM | POA: Diagnosis not present

## 2019-01-31 DIAGNOSIS — M9901 Segmental and somatic dysfunction of cervical region: Secondary | ICD-10-CM | POA: Diagnosis not present

## 2019-01-31 DIAGNOSIS — M5136 Other intervertebral disc degeneration, lumbar region: Secondary | ICD-10-CM | POA: Diagnosis not present

## 2019-01-31 DIAGNOSIS — M5417 Radiculopathy, lumbosacral region: Secondary | ICD-10-CM | POA: Diagnosis not present

## 2019-02-01 DIAGNOSIS — M9901 Segmental and somatic dysfunction of cervical region: Secondary | ICD-10-CM | POA: Diagnosis not present

## 2019-02-01 DIAGNOSIS — M5417 Radiculopathy, lumbosacral region: Secondary | ICD-10-CM | POA: Diagnosis not present

## 2019-02-01 DIAGNOSIS — M5137 Other intervertebral disc degeneration, lumbosacral region: Secondary | ICD-10-CM | POA: Diagnosis not present

## 2019-02-01 DIAGNOSIS — M50322 Other cervical disc degeneration at C5-C6 level: Secondary | ICD-10-CM | POA: Diagnosis not present

## 2019-02-01 DIAGNOSIS — M9904 Segmental and somatic dysfunction of sacral region: Secondary | ICD-10-CM | POA: Diagnosis not present

## 2019-02-01 DIAGNOSIS — M47816 Spondylosis without myelopathy or radiculopathy, lumbar region: Secondary | ICD-10-CM | POA: Diagnosis not present

## 2019-02-01 DIAGNOSIS — M5136 Other intervertebral disc degeneration, lumbar region: Secondary | ICD-10-CM | POA: Diagnosis not present

## 2019-02-01 DIAGNOSIS — M5031 Other cervical disc degeneration,  high cervical region: Secondary | ICD-10-CM | POA: Diagnosis not present

## 2019-02-01 DIAGNOSIS — M47817 Spondylosis without myelopathy or radiculopathy, lumbosacral region: Secondary | ICD-10-CM | POA: Diagnosis not present

## 2019-02-01 DIAGNOSIS — M9903 Segmental and somatic dysfunction of lumbar region: Secondary | ICD-10-CM | POA: Diagnosis not present

## 2019-02-01 DIAGNOSIS — Q72891 Other reduction defects of right lower limb: Secondary | ICD-10-CM | POA: Diagnosis not present

## 2019-02-01 DIAGNOSIS — M9905 Segmental and somatic dysfunction of pelvic region: Secondary | ICD-10-CM | POA: Diagnosis not present

## 2019-02-02 DIAGNOSIS — Q72891 Other reduction defects of right lower limb: Secondary | ICD-10-CM | POA: Diagnosis not present

## 2019-02-02 DIAGNOSIS — M5417 Radiculopathy, lumbosacral region: Secondary | ICD-10-CM | POA: Diagnosis not present

## 2019-02-02 DIAGNOSIS — M5031 Other cervical disc degeneration,  high cervical region: Secondary | ICD-10-CM | POA: Diagnosis not present

## 2019-02-02 DIAGNOSIS — M5136 Other intervertebral disc degeneration, lumbar region: Secondary | ICD-10-CM | POA: Diagnosis not present

## 2019-02-02 DIAGNOSIS — M9904 Segmental and somatic dysfunction of sacral region: Secondary | ICD-10-CM | POA: Diagnosis not present

## 2019-02-02 DIAGNOSIS — M50322 Other cervical disc degeneration at C5-C6 level: Secondary | ICD-10-CM | POA: Diagnosis not present

## 2019-02-02 DIAGNOSIS — M9901 Segmental and somatic dysfunction of cervical region: Secondary | ICD-10-CM | POA: Diagnosis not present

## 2019-02-02 DIAGNOSIS — M9903 Segmental and somatic dysfunction of lumbar region: Secondary | ICD-10-CM | POA: Diagnosis not present

## 2019-02-02 DIAGNOSIS — M5137 Other intervertebral disc degeneration, lumbosacral region: Secondary | ICD-10-CM | POA: Diagnosis not present

## 2019-02-02 DIAGNOSIS — M47817 Spondylosis without myelopathy or radiculopathy, lumbosacral region: Secondary | ICD-10-CM | POA: Diagnosis not present

## 2019-02-02 DIAGNOSIS — M9905 Segmental and somatic dysfunction of pelvic region: Secondary | ICD-10-CM | POA: Diagnosis not present

## 2019-02-02 DIAGNOSIS — M47816 Spondylosis without myelopathy or radiculopathy, lumbar region: Secondary | ICD-10-CM | POA: Diagnosis not present

## 2019-02-07 DIAGNOSIS — Q72891 Other reduction defects of right lower limb: Secondary | ICD-10-CM | POA: Diagnosis not present

## 2019-02-07 DIAGNOSIS — M5137 Other intervertebral disc degeneration, lumbosacral region: Secondary | ICD-10-CM | POA: Diagnosis not present

## 2019-02-07 DIAGNOSIS — M5136 Other intervertebral disc degeneration, lumbar region: Secondary | ICD-10-CM | POA: Diagnosis not present

## 2019-02-07 DIAGNOSIS — M5417 Radiculopathy, lumbosacral region: Secondary | ICD-10-CM | POA: Diagnosis not present

## 2019-02-07 DIAGNOSIS — M5031 Other cervical disc degeneration,  high cervical region: Secondary | ICD-10-CM | POA: Diagnosis not present

## 2019-02-07 DIAGNOSIS — M9905 Segmental and somatic dysfunction of pelvic region: Secondary | ICD-10-CM | POA: Diagnosis not present

## 2019-02-07 DIAGNOSIS — M9904 Segmental and somatic dysfunction of sacral region: Secondary | ICD-10-CM | POA: Diagnosis not present

## 2019-02-07 DIAGNOSIS — M50322 Other cervical disc degeneration at C5-C6 level: Secondary | ICD-10-CM | POA: Diagnosis not present

## 2019-02-07 DIAGNOSIS — M47817 Spondylosis without myelopathy or radiculopathy, lumbosacral region: Secondary | ICD-10-CM | POA: Diagnosis not present

## 2019-02-07 DIAGNOSIS — M9901 Segmental and somatic dysfunction of cervical region: Secondary | ICD-10-CM | POA: Diagnosis not present

## 2019-02-07 DIAGNOSIS — M47816 Spondylosis without myelopathy or radiculopathy, lumbar region: Secondary | ICD-10-CM | POA: Diagnosis not present

## 2019-02-07 DIAGNOSIS — M9903 Segmental and somatic dysfunction of lumbar region: Secondary | ICD-10-CM | POA: Diagnosis not present

## 2019-02-08 DIAGNOSIS — M9904 Segmental and somatic dysfunction of sacral region: Secondary | ICD-10-CM | POA: Diagnosis not present

## 2019-02-08 DIAGNOSIS — M5417 Radiculopathy, lumbosacral region: Secondary | ICD-10-CM | POA: Diagnosis not present

## 2019-02-08 DIAGNOSIS — M25562 Pain in left knee: Secondary | ICD-10-CM | POA: Diagnosis not present

## 2019-02-08 DIAGNOSIS — M50322 Other cervical disc degeneration at C5-C6 level: Secondary | ICD-10-CM | POA: Diagnosis not present

## 2019-02-08 DIAGNOSIS — M9905 Segmental and somatic dysfunction of pelvic region: Secondary | ICD-10-CM | POA: Diagnosis not present

## 2019-02-08 DIAGNOSIS — M47816 Spondylosis without myelopathy or radiculopathy, lumbar region: Secondary | ICD-10-CM | POA: Diagnosis not present

## 2019-02-08 DIAGNOSIS — M9901 Segmental and somatic dysfunction of cervical region: Secondary | ICD-10-CM | POA: Diagnosis not present

## 2019-02-08 DIAGNOSIS — M5137 Other intervertebral disc degeneration, lumbosacral region: Secondary | ICD-10-CM | POA: Diagnosis not present

## 2019-02-08 DIAGNOSIS — M5031 Other cervical disc degeneration,  high cervical region: Secondary | ICD-10-CM | POA: Diagnosis not present

## 2019-02-08 DIAGNOSIS — M47817 Spondylosis without myelopathy or radiculopathy, lumbosacral region: Secondary | ICD-10-CM | POA: Diagnosis not present

## 2019-02-08 DIAGNOSIS — M5136 Other intervertebral disc degeneration, lumbar region: Secondary | ICD-10-CM | POA: Diagnosis not present

## 2019-02-08 DIAGNOSIS — M9903 Segmental and somatic dysfunction of lumbar region: Secondary | ICD-10-CM | POA: Diagnosis not present

## 2019-02-08 DIAGNOSIS — Q72891 Other reduction defects of right lower limb: Secondary | ICD-10-CM | POA: Diagnosis not present

## 2019-02-10 DIAGNOSIS — M5417 Radiculopathy, lumbosacral region: Secondary | ICD-10-CM | POA: Diagnosis not present

## 2019-02-10 DIAGNOSIS — M47817 Spondylosis without myelopathy or radiculopathy, lumbosacral region: Secondary | ICD-10-CM | POA: Diagnosis not present

## 2019-02-10 DIAGNOSIS — M5031 Other cervical disc degeneration,  high cervical region: Secondary | ICD-10-CM | POA: Diagnosis not present

## 2019-02-10 DIAGNOSIS — M9905 Segmental and somatic dysfunction of pelvic region: Secondary | ICD-10-CM | POA: Diagnosis not present

## 2019-02-10 DIAGNOSIS — Q72891 Other reduction defects of right lower limb: Secondary | ICD-10-CM | POA: Diagnosis not present

## 2019-02-10 DIAGNOSIS — M50322 Other cervical disc degeneration at C5-C6 level: Secondary | ICD-10-CM | POA: Diagnosis not present

## 2019-02-10 DIAGNOSIS — M9901 Segmental and somatic dysfunction of cervical region: Secondary | ICD-10-CM | POA: Diagnosis not present

## 2019-02-10 DIAGNOSIS — M9903 Segmental and somatic dysfunction of lumbar region: Secondary | ICD-10-CM | POA: Diagnosis not present

## 2019-02-10 DIAGNOSIS — M9904 Segmental and somatic dysfunction of sacral region: Secondary | ICD-10-CM | POA: Diagnosis not present

## 2019-02-10 DIAGNOSIS — M5137 Other intervertebral disc degeneration, lumbosacral region: Secondary | ICD-10-CM | POA: Diagnosis not present

## 2019-02-10 DIAGNOSIS — M5136 Other intervertebral disc degeneration, lumbar region: Secondary | ICD-10-CM | POA: Diagnosis not present

## 2019-02-10 DIAGNOSIS — M47816 Spondylosis without myelopathy or radiculopathy, lumbar region: Secondary | ICD-10-CM | POA: Diagnosis not present

## 2019-02-11 ENCOUNTER — Other Ambulatory Visit: Payer: Self-pay

## 2019-02-11 MED ORDER — LOSARTAN POTASSIUM 25 MG PO TABS: 25.0000 mg | ORAL_TABLET | Freq: Every evening | ORAL | 0 refills | Status: DC

## 2019-02-14 DIAGNOSIS — M9901 Segmental and somatic dysfunction of cervical region: Secondary | ICD-10-CM | POA: Diagnosis not present

## 2019-02-14 DIAGNOSIS — M47817 Spondylosis without myelopathy or radiculopathy, lumbosacral region: Secondary | ICD-10-CM | POA: Diagnosis not present

## 2019-02-14 DIAGNOSIS — M5417 Radiculopathy, lumbosacral region: Secondary | ICD-10-CM | POA: Diagnosis not present

## 2019-02-14 DIAGNOSIS — M47816 Spondylosis without myelopathy or radiculopathy, lumbar region: Secondary | ICD-10-CM | POA: Diagnosis not present

## 2019-02-14 DIAGNOSIS — M9904 Segmental and somatic dysfunction of sacral region: Secondary | ICD-10-CM | POA: Diagnosis not present

## 2019-02-14 DIAGNOSIS — M5137 Other intervertebral disc degeneration, lumbosacral region: Secondary | ICD-10-CM | POA: Diagnosis not present

## 2019-02-14 DIAGNOSIS — M50322 Other cervical disc degeneration at C5-C6 level: Secondary | ICD-10-CM | POA: Diagnosis not present

## 2019-02-14 DIAGNOSIS — Q72891 Other reduction defects of right lower limb: Secondary | ICD-10-CM | POA: Diagnosis not present

## 2019-02-14 DIAGNOSIS — M9905 Segmental and somatic dysfunction of pelvic region: Secondary | ICD-10-CM | POA: Diagnosis not present

## 2019-02-14 DIAGNOSIS — M9903 Segmental and somatic dysfunction of lumbar region: Secondary | ICD-10-CM | POA: Diagnosis not present

## 2019-02-14 DIAGNOSIS — M5031 Other cervical disc degeneration,  high cervical region: Secondary | ICD-10-CM | POA: Diagnosis not present

## 2019-02-14 DIAGNOSIS — M5136 Other intervertebral disc degeneration, lumbar region: Secondary | ICD-10-CM | POA: Diagnosis not present

## 2019-02-16 DIAGNOSIS — M47817 Spondylosis without myelopathy or radiculopathy, lumbosacral region: Secondary | ICD-10-CM | POA: Diagnosis not present

## 2019-02-16 DIAGNOSIS — M47816 Spondylosis without myelopathy or radiculopathy, lumbar region: Secondary | ICD-10-CM | POA: Diagnosis not present

## 2019-02-16 DIAGNOSIS — M9903 Segmental and somatic dysfunction of lumbar region: Secondary | ICD-10-CM | POA: Diagnosis not present

## 2019-02-16 DIAGNOSIS — M5137 Other intervertebral disc degeneration, lumbosacral region: Secondary | ICD-10-CM | POA: Diagnosis not present

## 2019-02-16 DIAGNOSIS — M9904 Segmental and somatic dysfunction of sacral region: Secondary | ICD-10-CM | POA: Diagnosis not present

## 2019-02-16 DIAGNOSIS — M50322 Other cervical disc degeneration at C5-C6 level: Secondary | ICD-10-CM | POA: Diagnosis not present

## 2019-02-16 DIAGNOSIS — M5136 Other intervertebral disc degeneration, lumbar region: Secondary | ICD-10-CM | POA: Diagnosis not present

## 2019-02-16 DIAGNOSIS — M5417 Radiculopathy, lumbosacral region: Secondary | ICD-10-CM | POA: Diagnosis not present

## 2019-02-16 DIAGNOSIS — M9905 Segmental and somatic dysfunction of pelvic region: Secondary | ICD-10-CM | POA: Diagnosis not present

## 2019-02-16 DIAGNOSIS — M9901 Segmental and somatic dysfunction of cervical region: Secondary | ICD-10-CM | POA: Diagnosis not present

## 2019-02-16 DIAGNOSIS — Q72891 Other reduction defects of right lower limb: Secondary | ICD-10-CM | POA: Diagnosis not present

## 2019-02-16 DIAGNOSIS — M5031 Other cervical disc degeneration,  high cervical region: Secondary | ICD-10-CM | POA: Diagnosis not present

## 2019-02-17 DIAGNOSIS — M9904 Segmental and somatic dysfunction of sacral region: Secondary | ICD-10-CM | POA: Diagnosis not present

## 2019-02-17 DIAGNOSIS — M5417 Radiculopathy, lumbosacral region: Secondary | ICD-10-CM | POA: Diagnosis not present

## 2019-02-17 DIAGNOSIS — M5031 Other cervical disc degeneration,  high cervical region: Secondary | ICD-10-CM | POA: Diagnosis not present

## 2019-02-17 DIAGNOSIS — M545 Low back pain: Secondary | ICD-10-CM | POA: Diagnosis not present

## 2019-02-17 DIAGNOSIS — M47816 Spondylosis without myelopathy or radiculopathy, lumbar region: Secondary | ICD-10-CM | POA: Diagnosis not present

## 2019-02-17 DIAGNOSIS — M9905 Segmental and somatic dysfunction of pelvic region: Secondary | ICD-10-CM | POA: Diagnosis not present

## 2019-02-17 DIAGNOSIS — Q72891 Other reduction defects of right lower limb: Secondary | ICD-10-CM | POA: Diagnosis not present

## 2019-02-17 DIAGNOSIS — M5136 Other intervertebral disc degeneration, lumbar region: Secondary | ICD-10-CM | POA: Diagnosis not present

## 2019-02-17 DIAGNOSIS — M5416 Radiculopathy, lumbar region: Secondary | ICD-10-CM | POA: Diagnosis not present

## 2019-02-17 DIAGNOSIS — M50322 Other cervical disc degeneration at C5-C6 level: Secondary | ICD-10-CM | POA: Diagnosis not present

## 2019-02-17 DIAGNOSIS — M9903 Segmental and somatic dysfunction of lumbar region: Secondary | ICD-10-CM | POA: Diagnosis not present

## 2019-02-17 DIAGNOSIS — M25551 Pain in right hip: Secondary | ICD-10-CM | POA: Diagnosis not present

## 2019-02-17 DIAGNOSIS — M47817 Spondylosis without myelopathy or radiculopathy, lumbosacral region: Secondary | ICD-10-CM | POA: Diagnosis not present

## 2019-02-17 DIAGNOSIS — M5137 Other intervertebral disc degeneration, lumbosacral region: Secondary | ICD-10-CM | POA: Diagnosis not present

## 2019-02-17 DIAGNOSIS — M9901 Segmental and somatic dysfunction of cervical region: Secondary | ICD-10-CM | POA: Diagnosis not present

## 2019-02-22 DIAGNOSIS — M5137 Other intervertebral disc degeneration, lumbosacral region: Secondary | ICD-10-CM | POA: Diagnosis not present

## 2019-02-22 DIAGNOSIS — M9905 Segmental and somatic dysfunction of pelvic region: Secondary | ICD-10-CM | POA: Diagnosis not present

## 2019-02-22 DIAGNOSIS — M5136 Other intervertebral disc degeneration, lumbar region: Secondary | ICD-10-CM | POA: Diagnosis not present

## 2019-02-22 DIAGNOSIS — M9904 Segmental and somatic dysfunction of sacral region: Secondary | ICD-10-CM | POA: Diagnosis not present

## 2019-02-22 DIAGNOSIS — M47817 Spondylosis without myelopathy or radiculopathy, lumbosacral region: Secondary | ICD-10-CM | POA: Diagnosis not present

## 2019-02-22 DIAGNOSIS — M5031 Other cervical disc degeneration,  high cervical region: Secondary | ICD-10-CM | POA: Diagnosis not present

## 2019-02-22 DIAGNOSIS — Q72891 Other reduction defects of right lower limb: Secondary | ICD-10-CM | POA: Diagnosis not present

## 2019-02-22 DIAGNOSIS — M9901 Segmental and somatic dysfunction of cervical region: Secondary | ICD-10-CM | POA: Diagnosis not present

## 2019-02-22 DIAGNOSIS — M47816 Spondylosis without myelopathy or radiculopathy, lumbar region: Secondary | ICD-10-CM | POA: Diagnosis not present

## 2019-02-22 DIAGNOSIS — M50322 Other cervical disc degeneration at C5-C6 level: Secondary | ICD-10-CM | POA: Diagnosis not present

## 2019-02-22 DIAGNOSIS — M9903 Segmental and somatic dysfunction of lumbar region: Secondary | ICD-10-CM | POA: Diagnosis not present

## 2019-02-22 DIAGNOSIS — M5417 Radiculopathy, lumbosacral region: Secondary | ICD-10-CM | POA: Diagnosis not present

## 2019-02-23 DIAGNOSIS — B351 Tinea unguium: Secondary | ICD-10-CM | POA: Diagnosis not present

## 2019-02-23 DIAGNOSIS — L308 Other specified dermatitis: Secondary | ICD-10-CM | POA: Diagnosis not present

## 2019-02-25 DIAGNOSIS — M9901 Segmental and somatic dysfunction of cervical region: Secondary | ICD-10-CM | POA: Diagnosis not present

## 2019-02-25 DIAGNOSIS — M47817 Spondylosis without myelopathy or radiculopathy, lumbosacral region: Secondary | ICD-10-CM | POA: Diagnosis not present

## 2019-02-25 DIAGNOSIS — M9904 Segmental and somatic dysfunction of sacral region: Secondary | ICD-10-CM | POA: Diagnosis not present

## 2019-02-25 DIAGNOSIS — M50322 Other cervical disc degeneration at C5-C6 level: Secondary | ICD-10-CM | POA: Diagnosis not present

## 2019-02-25 DIAGNOSIS — M9905 Segmental and somatic dysfunction of pelvic region: Secondary | ICD-10-CM | POA: Diagnosis not present

## 2019-02-25 DIAGNOSIS — M9903 Segmental and somatic dysfunction of lumbar region: Secondary | ICD-10-CM | POA: Diagnosis not present

## 2019-02-25 DIAGNOSIS — Q72891 Other reduction defects of right lower limb: Secondary | ICD-10-CM | POA: Diagnosis not present

## 2019-02-25 DIAGNOSIS — M47816 Spondylosis without myelopathy or radiculopathy, lumbar region: Secondary | ICD-10-CM | POA: Diagnosis not present

## 2019-02-25 DIAGNOSIS — M5137 Other intervertebral disc degeneration, lumbosacral region: Secondary | ICD-10-CM | POA: Diagnosis not present

## 2019-02-25 DIAGNOSIS — M5136 Other intervertebral disc degeneration, lumbar region: Secondary | ICD-10-CM | POA: Diagnosis not present

## 2019-02-25 DIAGNOSIS — M5417 Radiculopathy, lumbosacral region: Secondary | ICD-10-CM | POA: Diagnosis not present

## 2019-02-25 DIAGNOSIS — M5031 Other cervical disc degeneration,  high cervical region: Secondary | ICD-10-CM | POA: Diagnosis not present

## 2019-02-28 DIAGNOSIS — M47816 Spondylosis without myelopathy or radiculopathy, lumbar region: Secondary | ICD-10-CM | POA: Diagnosis not present

## 2019-02-28 DIAGNOSIS — M50322 Other cervical disc degeneration at C5-C6 level: Secondary | ICD-10-CM | POA: Diagnosis not present

## 2019-02-28 DIAGNOSIS — M5417 Radiculopathy, lumbosacral region: Secondary | ICD-10-CM | POA: Diagnosis not present

## 2019-02-28 DIAGNOSIS — M9903 Segmental and somatic dysfunction of lumbar region: Secondary | ICD-10-CM | POA: Diagnosis not present

## 2019-02-28 DIAGNOSIS — M5136 Other intervertebral disc degeneration, lumbar region: Secondary | ICD-10-CM | POA: Diagnosis not present

## 2019-02-28 DIAGNOSIS — M5137 Other intervertebral disc degeneration, lumbosacral region: Secondary | ICD-10-CM | POA: Diagnosis not present

## 2019-02-28 DIAGNOSIS — M47817 Spondylosis without myelopathy or radiculopathy, lumbosacral region: Secondary | ICD-10-CM | POA: Diagnosis not present

## 2019-02-28 DIAGNOSIS — M9905 Segmental and somatic dysfunction of pelvic region: Secondary | ICD-10-CM | POA: Diagnosis not present

## 2019-02-28 DIAGNOSIS — M5031 Other cervical disc degeneration,  high cervical region: Secondary | ICD-10-CM | POA: Diagnosis not present

## 2019-02-28 DIAGNOSIS — Q72891 Other reduction defects of right lower limb: Secondary | ICD-10-CM | POA: Diagnosis not present

## 2019-02-28 DIAGNOSIS — M9901 Segmental and somatic dysfunction of cervical region: Secondary | ICD-10-CM | POA: Diagnosis not present

## 2019-02-28 DIAGNOSIS — M9904 Segmental and somatic dysfunction of sacral region: Secondary | ICD-10-CM | POA: Diagnosis not present

## 2019-03-01 DIAGNOSIS — M545 Low back pain: Secondary | ICD-10-CM | POA: Diagnosis not present

## 2019-03-02 DIAGNOSIS — M5417 Radiculopathy, lumbosacral region: Secondary | ICD-10-CM | POA: Diagnosis not present

## 2019-03-02 DIAGNOSIS — M47816 Spondylosis without myelopathy or radiculopathy, lumbar region: Secondary | ICD-10-CM | POA: Diagnosis not present

## 2019-03-02 DIAGNOSIS — M5136 Other intervertebral disc degeneration, lumbar region: Secondary | ICD-10-CM | POA: Diagnosis not present

## 2019-03-02 DIAGNOSIS — M9904 Segmental and somatic dysfunction of sacral region: Secondary | ICD-10-CM | POA: Diagnosis not present

## 2019-03-02 DIAGNOSIS — M9901 Segmental and somatic dysfunction of cervical region: Secondary | ICD-10-CM | POA: Diagnosis not present

## 2019-03-02 DIAGNOSIS — M5137 Other intervertebral disc degeneration, lumbosacral region: Secondary | ICD-10-CM | POA: Diagnosis not present

## 2019-03-02 DIAGNOSIS — M50322 Other cervical disc degeneration at C5-C6 level: Secondary | ICD-10-CM | POA: Diagnosis not present

## 2019-03-02 DIAGNOSIS — M9905 Segmental and somatic dysfunction of pelvic region: Secondary | ICD-10-CM | POA: Diagnosis not present

## 2019-03-02 DIAGNOSIS — M9903 Segmental and somatic dysfunction of lumbar region: Secondary | ICD-10-CM | POA: Diagnosis not present

## 2019-03-02 DIAGNOSIS — Q72891 Other reduction defects of right lower limb: Secondary | ICD-10-CM | POA: Diagnosis not present

## 2019-03-02 DIAGNOSIS — M47817 Spondylosis without myelopathy or radiculopathy, lumbosacral region: Secondary | ICD-10-CM | POA: Diagnosis not present

## 2019-03-02 DIAGNOSIS — M5031 Other cervical disc degeneration,  high cervical region: Secondary | ICD-10-CM | POA: Diagnosis not present

## 2019-03-14 DIAGNOSIS — M48061 Spinal stenosis, lumbar region without neurogenic claudication: Secondary | ICD-10-CM | POA: Diagnosis not present

## 2019-03-14 DIAGNOSIS — M5416 Radiculopathy, lumbar region: Secondary | ICD-10-CM | POA: Diagnosis not present

## 2019-03-14 DIAGNOSIS — M545 Low back pain: Secondary | ICD-10-CM | POA: Diagnosis not present

## 2019-03-23 DIAGNOSIS — M545 Low back pain: Secondary | ICD-10-CM | POA: Diagnosis not present

## 2019-03-23 DIAGNOSIS — M48061 Spinal stenosis, lumbar region without neurogenic claudication: Secondary | ICD-10-CM | POA: Diagnosis not present

## 2019-03-30 DIAGNOSIS — H0102B Squamous blepharitis left eye, upper and lower eyelids: Secondary | ICD-10-CM | POA: Diagnosis not present

## 2019-03-30 DIAGNOSIS — H40023 Open angle with borderline findings, high risk, bilateral: Secondary | ICD-10-CM | POA: Diagnosis not present

## 2019-03-30 DIAGNOSIS — H11153 Pinguecula, bilateral: Secondary | ICD-10-CM | POA: Diagnosis not present

## 2019-03-30 DIAGNOSIS — H353131 Nonexudative age-related macular degeneration, bilateral, early dry stage: Secondary | ICD-10-CM | POA: Diagnosis not present

## 2019-03-30 DIAGNOSIS — H18413 Arcus senilis, bilateral: Secondary | ICD-10-CM | POA: Diagnosis not present

## 2019-03-30 DIAGNOSIS — H11823 Conjunctivochalasis, bilateral: Secondary | ICD-10-CM | POA: Diagnosis not present

## 2019-03-30 DIAGNOSIS — H0102A Squamous blepharitis right eye, upper and lower eyelids: Secondary | ICD-10-CM | POA: Diagnosis not present

## 2019-03-30 DIAGNOSIS — H04123 Dry eye syndrome of bilateral lacrimal glands: Secondary | ICD-10-CM | POA: Diagnosis not present

## 2019-04-01 ENCOUNTER — Other Ambulatory Visit: Payer: Self-pay | Admitting: Cardiology

## 2019-04-01 DIAGNOSIS — I2510 Atherosclerotic heart disease of native coronary artery without angina pectoris: Secondary | ICD-10-CM

## 2019-04-07 DIAGNOSIS — M545 Low back pain: Secondary | ICD-10-CM | POA: Diagnosis not present

## 2019-04-07 DIAGNOSIS — M48061 Spinal stenosis, lumbar region without neurogenic claudication: Secondary | ICD-10-CM | POA: Diagnosis not present

## 2019-04-07 DIAGNOSIS — M5416 Radiculopathy, lumbar region: Secondary | ICD-10-CM | POA: Diagnosis not present

## 2019-04-12 DIAGNOSIS — E7800 Pure hypercholesterolemia, unspecified: Secondary | ICD-10-CM | POA: Diagnosis not present

## 2019-04-12 DIAGNOSIS — Z23 Encounter for immunization: Secondary | ICD-10-CM | POA: Diagnosis not present

## 2019-04-12 DIAGNOSIS — Z125 Encounter for screening for malignant neoplasm of prostate: Secondary | ICD-10-CM | POA: Diagnosis not present

## 2019-04-12 DIAGNOSIS — R7302 Impaired glucose tolerance (oral): Secondary | ICD-10-CM | POA: Diagnosis not present

## 2019-04-15 DIAGNOSIS — I130 Hypertensive heart and chronic kidney disease with heart failure and stage 1 through stage 4 chronic kidney disease, or unspecified chronic kidney disease: Secondary | ICD-10-CM | POA: Diagnosis not present

## 2019-04-15 DIAGNOSIS — R82998 Other abnormal findings in urine: Secondary | ICD-10-CM | POA: Diagnosis not present

## 2019-04-19 DIAGNOSIS — N401 Enlarged prostate with lower urinary tract symptoms: Secondary | ICD-10-CM | POA: Diagnosis not present

## 2019-04-19 DIAGNOSIS — H9190 Unspecified hearing loss, unspecified ear: Secondary | ICD-10-CM | POA: Diagnosis not present

## 2019-04-19 DIAGNOSIS — D692 Other nonthrombocytopenic purpura: Secondary | ICD-10-CM | POA: Diagnosis not present

## 2019-04-19 DIAGNOSIS — I519 Heart disease, unspecified: Secondary | ICD-10-CM | POA: Diagnosis not present

## 2019-04-19 DIAGNOSIS — I209 Angina pectoris, unspecified: Secondary | ICD-10-CM | POA: Diagnosis not present

## 2019-04-19 DIAGNOSIS — E291 Testicular hypofunction: Secondary | ICD-10-CM | POA: Diagnosis not present

## 2019-04-19 DIAGNOSIS — Z9861 Coronary angioplasty status: Secondary | ICD-10-CM | POA: Diagnosis not present

## 2019-04-19 DIAGNOSIS — Z0000 Encounter for general adult medical examination without abnormal findings: Secondary | ICD-10-CM | POA: Diagnosis not present

## 2019-04-19 DIAGNOSIS — Z1339 Encounter for screening examination for other mental health and behavioral disorders: Secondary | ICD-10-CM | POA: Diagnosis not present

## 2019-04-19 DIAGNOSIS — I130 Hypertensive heart and chronic kidney disease with heart failure and stage 1 through stage 4 chronic kidney disease, or unspecified chronic kidney disease: Secondary | ICD-10-CM | POA: Diagnosis not present

## 2019-04-19 DIAGNOSIS — N1831 Chronic kidney disease, stage 3a: Secondary | ICD-10-CM | POA: Diagnosis not present

## 2019-04-19 DIAGNOSIS — Z1331 Encounter for screening for depression: Secondary | ICD-10-CM | POA: Diagnosis not present

## 2019-04-19 DIAGNOSIS — I272 Pulmonary hypertension, unspecified: Secondary | ICD-10-CM | POA: Diagnosis not present

## 2019-04-19 DIAGNOSIS — R7302 Impaired glucose tolerance (oral): Secondary | ICD-10-CM | POA: Diagnosis not present

## 2019-04-22 DIAGNOSIS — M48061 Spinal stenosis, lumbar region without neurogenic claudication: Secondary | ICD-10-CM | POA: Diagnosis not present

## 2019-04-23 ENCOUNTER — Other Ambulatory Visit: Payer: Self-pay | Admitting: Cardiology

## 2019-04-23 DIAGNOSIS — E782 Mixed hyperlipidemia: Secondary | ICD-10-CM

## 2019-04-25 DIAGNOSIS — Z1212 Encounter for screening for malignant neoplasm of rectum: Secondary | ICD-10-CM | POA: Diagnosis not present

## 2019-04-27 DIAGNOSIS — Z20828 Contact with and (suspected) exposure to other viral communicable diseases: Secondary | ICD-10-CM | POA: Diagnosis not present

## 2019-04-27 DIAGNOSIS — R05 Cough: Secondary | ICD-10-CM | POA: Diagnosis not present

## 2019-04-27 DIAGNOSIS — R0981 Nasal congestion: Secondary | ICD-10-CM | POA: Diagnosis not present

## 2019-05-01 ENCOUNTER — Ambulatory Visit: Payer: PPO | Attending: Internal Medicine

## 2019-05-01 DIAGNOSIS — Z23 Encounter for immunization: Secondary | ICD-10-CM

## 2019-05-01 NOTE — Progress Notes (Signed)
   Covid-19 Vaccination Clinic  Name:  [name]    MRN: [id number] DOB: May 24, 1945  05/01/2019  [name] was observed post Covid-19 immunization for 15 minutes without incidence. He was provided with Vaccine Information Sheet and instruction to access the V-Safe system.   [name] was instructed to call 911 with any severe reactions post vaccine: Marland Kitchen Difficulty breathing  . Swelling of your face and throat  . A fast heartbeat  . A bad rash all over your body  . Dizziness and weakness    Immunizations Administered    Name Date Dose VIS Date Route   Pfizer COVID-19 Vaccine 05/01/2019  8:15 AM 0.3 mL 02/18/2019 Intramuscular   Manufacturer: [place]   Lot: [id number]   [place]: [id number]

## 2019-05-04 DIAGNOSIS — M48061 Spinal stenosis, lumbar region without neurogenic claudication: Secondary | ICD-10-CM | POA: Diagnosis not present

## 2019-05-04 DIAGNOSIS — M545 Low back pain: Secondary | ICD-10-CM | POA: Diagnosis not present

## 2019-05-05 ENCOUNTER — Other Ambulatory Visit: Payer: Self-pay | Admitting: Cardiology

## 2019-05-10 DIAGNOSIS — M5416 Radiculopathy, lumbar region: Secondary | ICD-10-CM | POA: Diagnosis not present

## 2019-05-10 DIAGNOSIS — M545 Low back pain: Secondary | ICD-10-CM | POA: Diagnosis not present

## 2019-05-10 DIAGNOSIS — M48061 Spinal stenosis, lumbar region without neurogenic claudication: Secondary | ICD-10-CM | POA: Diagnosis not present

## 2019-05-18 DIAGNOSIS — M5416 Radiculopathy, lumbar region: Secondary | ICD-10-CM | POA: Diagnosis not present

## 2019-05-18 DIAGNOSIS — M48061 Spinal stenosis, lumbar region without neurogenic claudication: Secondary | ICD-10-CM | POA: Diagnosis not present

## 2019-05-18 DIAGNOSIS — M545 Low back pain: Secondary | ICD-10-CM | POA: Diagnosis not present

## 2019-05-24 ENCOUNTER — Ambulatory Visit: Payer: PPO | Attending: Internal Medicine

## 2019-05-24 DIAGNOSIS — Z23 Encounter for immunization: Secondary | ICD-10-CM

## 2019-05-24 NOTE — Progress Notes (Signed)
   Covid-19 Vaccination Clinic  Name:  [name]    MRN: [id number] DOB: 1945-04-14  05/24/2019  [name] was observed post Covid-19 immunization for 15 minutes without incident. He was provided with Vaccine Information Sheet and instruction to access the V-Safe system.   [name] was instructed to call 911 with any severe reactions post vaccine: Marland Kitchen Difficulty breathing  . Swelling of face and throat  . A fast heartbeat  . A bad rash all over body  . Dizziness and weakness   Immunizations Administered    Name Date Dose VIS Date Route   Pfizer COVID-19 Vaccine 05/24/2019  2:09 PM 0.3 mL 02/18/2019 Intramuscular   Manufacturer: [place]   Lot: [id number]   [place]: [id number]

## 2019-05-29 NOTE — Progress Notes (Signed)
Primary Physician/Referring:  Pedro Pao, MD  Patient ID: [name], male    DOB: 1945-11-23, 74 y.o.   MRN: [id number]  Chief Complaint  Patient presents with  . Coronary Artery Disease  . Hyperlipidemia   HPI:    [name]  is a 74 y.o. [place] male patient with known history of coronary artery disease and chronic stable angina,  hypertension, mixed hyperlipidemia, GERD, chronic back pain, DJD. He has stable angina. Patient is very active, works as a Therapist, sports and also Garment/textile technologist and does heavy work without any significant discomfort.   For the past 6 months he has not had any exertional angina however he has reduced his physical activity due to back pain.  He endorses new dyspnea with exertion activity for the past few months.  Has occasional dizziness when he suddenly stands up.  Past Medical History:  Diagnosis Date  . Anginal pain ([place])   . Arthritis   . Basal cell carcinoma    back of neck  . Blood transfusion without reported diagnosis 2006   during open heart surgery  . CAD (coronary artery disease)   . Diverticulosis 2013   Noted on Colonoscopy  . Dyspnea    with activity and walking, sometime has SOB walking to mailbox and back to the house  . GERD (gastroesophageal reflux disease)   . History of colon polyps 2008   Noted on Colonoscopy  . History of esophageal stricture 2003   Noted on EGD  . Hyperlipidemia   . Hypertension   . IVCD (intraventricular conduction defect) 05/2018   Noted on EKG  . 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 Left axis deviation 05/2018   Noted on EKG  . 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 LVH (left ventricular hypertrophy) 05/2018   Noted on EKG  . Phimosis   . Pneumonia   . Tuberculosis 1956  . Wears glasses   . Wheezing    Past Surgical History:  Procedure Laterality Date  . ANGIOPLASTY  2010   3 stents placed  . CATARACT  EXTRACTION W/ INTRAOCULAR LENS  IMPLANT, BILATERAL  2012   bilateral  . CIRCUMCISION N/A 08/30/2018   Procedure: CIRCUMCISION ADULT;  Surgeon: Kathie Rhodes, MD;  Location: [facility];  Service: Urology;  Laterality: N/A;  . COLONOSCOPY  2013, 2008  . CORONARY ARTERY BYPASS GRAFT  2006   x4  . ESOPHAGOGASTRODUODENOSCOPY  10/2011  . KNEE ARTHROSCOPY  2001  . [place] SURGERY  1998  . TENDON RECONSTRUCTION Right 10/27/2013   Procedure: RIGHT OPEN TRICEPS REPAIR;  Surgeon: Renette Butters, MD;  Location: [place];  Service: Orthopedics;  Laterality: Right;   Family History  Problem Relation Age of Onset  . Colon cancer Mother 56  . Hypertension Mother   . Colon cancer Father 78  . Heart attack Brother   . Heart failure Brother   . Stomach cancer Neg Hx     Social History   Tobacco Use  . Smoking status: Never Smoker  . Smokeless tobacco: Never Used  Substance Use Topics  . Alcohol use: No   ROS  Review of Systems  Cardiovascular: Negative for chest pain, dyspnea on exertion and leg swelling.  Musculoskeletal: Positive for back pain.  Gastrointestinal: Negative for melena.   Objective  Blood pressure 125/68, pulse 60, temperature 97.8 F (36.6 C), temperature  source Temporal, resp. rate 16, height 5\' 7"  (1.702 m), weight 199 lb (90.3 kg), SpO2 97 %.  Vitals with BMI 05/30/2019 08/30/2018 08/30/2018  Height 5\' 7"  - -  Weight 199 lbs - -  BMI [id number] - -  Systolic 0000000 [id number] -  Diastolic 68 62 -  Pulse 60 57 51     Physical Exam  Cardiovascular: Normal rate, regular rhythm, normal heart sounds and intact distal pulses. Exam reveals no gallop.  No murmur heard. No leg edema, no JVD.  Pulmonary/Chest: Effort normal and breath sounds normal.  Abdominal: Soft. Bowel sounds are normal.   Laboratory examination:   Recent Labs    08/30/18 1035  NA 137  K 4.7  GLUCOSE 112*   CrCl cannot be calculated (Patient's most recent lab result is  older than the maximum 21 days allowed.).  CMP Latest Ref Rng & Units 08/30/2018 03/17/2016 03/16/2016  Glucose 70 - 99 mg/dL 112(H) 117(H) 137(H)  BUN 6 - 20 mg/dL - 15 23(H)  Creatinine 0.61 - 1.24 mg/dL - 1.13 1.19  Sodium 135 - 145 mmol/L 137 133(L) 135  Potassium 3.5 - 5.1 mmol/L 4.7 3.8 4.5  Chloride 101 - 111 mmol/L - 103 103  CO2 22 - 32 mmol/L - 25 23  Calcium 8.9 - 10.3 mg/dL - 8.2(L) 9.1  Total Protein 6.5 - 8.1 g/dL - 6.0(L) 6.4(L)  Total Bilirubin 0.3 - 1.2 mg/dL - 1.4(H) 1.8(H)  Alkaline Phos 38 - 126 U/L - 40 43  AST 15 - 41 U/L - 22 20  ALT 17 - 63 U/L - 17 19   CBC Latest Ref Rng & Units 08/30/2018 03/16/2016 10/31/2013  WBC 4.0 - 10.5 K/uL - 7.3 8.1  Hemoglobin 13.0 - 17.0 g/dL 13.9 15.6 14.4  Hematocrit 39.0 - 52.0 % 41.0 44.0 39.8  Platelets 150 - 400 K/uL - 181 246   External labs:   Cholesterol, total 120.000 m 04/12/2019 HDL 42 MG/DL 04/12/2019 LDL 36.000 mg 04/12/2019 Triglycerides 209.000 04/12/2019  A1C 6.100 % 04/12/2019  Hemoglobin 13.400 g/ 04/12/2019 Creatinine, Serum 1.200 mg/ 04/12/2019  Medications and allergies   Allergies  Allergen Reactions  . Codeine Nausea And Vomiting  . Guaifenesin & Derivatives Nausea And Vomiting  . Terbinafine And Related Other (See Comments)    Muscle pain  . Penicillins Rash    Tolerates Cefazolin without problems...[employer]     Current Outpatient Medications  Medication Instructions  . acetaminophen (TYLENOL) 1,000 mg, Oral, BH-each morning  . aspirin 325 mg, Oral, Every evening  . Calcium Carbonate Antacid (TUMS ULTRA PO) 2 tablets, Oral, Daily PRN  . CoQ10 100 mg, Oral, 2 times daily  . ezetimibe (ZETIA) 5 mg, Oral, Every evening, Takes 1/2 tablet daily  . finasteride (PROSCAR) 5 MG tablet 1 tablet, Oral, BH-each morning  . fish oil-omega-3 fatty acids 1 g, Oral, 2 times daily  . fluconazole (DIFLUCAN) 200 mg, Oral, Weekly  . losartan (COZAAR) 25 MG tablet TAKE 1 TABLET BY MOUTH EVERY EVENING  . Methylcobalamin (B12-ACTIVE  PO) Oral, Daily  . metoprolol tartrate (LOPRESSOR) 25 mg, Oral, Daily  . Multiple Vitamin (MULTIVITAMIN) tablet 1 tablet, Oral, Daily  . Multiple Vitamins-Minerals (PRESERVISION AREDS 2) CAPS 1 capsule, Oral, 2 times daily  . nitroGLYCERIN (NITROSTAT) 0.4 MG SL tablet TAKE 1 TABLET EVERY 5 MINUTES AS NEEDED FOR CHEST PAIN.  Marland Kitchen OVER THE COUNTER MEDICATION 100 mg, Daily, Vit B 6   . pantoprazole (PROTONIX) 40 mg, Oral, Daily  . Polyethyl  Glycol-Propyl Glycol (SYSTANE) 0.4-0.3 % GEL ophthalmic gel 1 application, Both Eyes, Daily at bedtime  . Polyethyl Glycol-Propyl Glycol (SYSTANE) 0.4-0.3 % GEL 1-2 drops, Ophthalmic, Daily PRN  . polyethylene glycol (MIRALAX) 17 g, Oral, Daily  . ranolazine (RANEXA) 1000 MG SR tablet TAKE 1 TABLET EVERY 12 HOURS  . rosuvastatin (CRESTOR) 20 MG tablet TAKE 1 TABLET BY MOUTH EVERY DAY  . vitamin C (ASCORBIC ACID) 500 mg, Oral, Daily   Radiology:   No results found.  Cardiac Studies:   Cardiac cath 06/2008: LIMA to LAD, Skip SVG to OM1 + D1 with ostial 4x15 non DES, has residual stenosis in the OM 1 limb of the skip graft. SVG to PD, Xience  Stent RCA 3x18, 3.5x15. CABG 1996.  Treadmill stress test 08/10/2015: 1. Resting EKG, normal sinus rhythm, incomplete right bundle branch block.  Stress EKG was nondiagnostic for ischemia due to inability to achieve target heart rate 73% MPHR).  Patient exercised on Bruce protocol for 8 minutes and achieved 10.16 mets.  There was no ST-T changes at peak exercise. Stress terminated due to fatigue.  Stress converted to [facility] pharmacologic stress.  There was no additional ST-T wave changes with from plastic stress protocol. Stress symptoms included dyspnea. 2. The perfusion imaging study demonstrates a very small sized very mild ischemia in the inferior wall.  Left ventricular systolic function calculated QGS was normal at 70%. This is a low risk study.  Carotid artery duplex 08/17/2014: No hemodynamically significant arterial  disease in the internal carotid artery bilaterally. Mild soft plaque noted.  Echocardiogram 04/08/2016: 1. Left ventricle cavity is normal in size. Mild concentric hypertrophy of the left ventricle. Normal global wall motion. Calculated EF 55%. 2. Left atrial cavity is normal in size. An aneurysm of inter atrial septum is present. No convincing evidence for patent foramen ovale. 3. Right atrial cavity appears to be slightly dilated. 4. Right ventricle cavity is slightly dilated. Normal right ventricular function. 5. Trace aortic regurgitation. Mild calcification of the aortic valve annulus. Mild aortic valve leaflet calcification. Mildly restricted aortic valve leaflets. Trace aortic valve stenosis with peak gradient 16 and mean gradient 8 mm of Hg. 6. Trace mitral regurgitation. 7. Mild tricuspid regurgitation. Mild pulmonary hypertension with approx. PA syst. pressure of 31 mm of Hg.  c.f. echo. of 08/09/2015, RV, RA appear to be slightly dilated. There is trace AR.  EKG  EKG 05/28/2019: Normal sinus rhythm at rate of 61 bpm, left axis deviation, left anterior fascicular block.  IVCD, borderline criteria for LVH.  No significant change from EKG 05/28/2018   Assessment     ICD-10-CM   1. Coronary artery disease of native artery of native heart with stable angina pectoris ([place])  I25.118 EKG 12-Lead    PCV ECHOCARDIOGRAM COMPLETE  2. Dyspnea on exertion  R06.00 PCV ECHOCARDIOGRAM COMPLETE  3. Essential hypertension  I10   4. Mixed hyperlipidemia  E78.2      No orders of the defined types were placed in this encounter.   Medications Discontinued During This Encounter  Medication Reason  . HYDROcodone-acetaminophen (NORCO) 10-325 MG tablet No longer needed (for PRN medications)    Recommendations:   VALDEZ SHOTTS  is a 74 y.o. [place] male patient with known history of coronary artery disease and chronic stable angina,  hypertension, mixed hyperlipidemia, GERD, chronic back pain,  DJD. He has stable angina. 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 Has not worked since Dec 2020 due to back pain.   Except for recent onset of dyspnea probably related to deconditioning, unable exercise or do much physical activity due to back pain, also probably diastolic dysfunction, no other symptoms, no clinical evidence of heart failure and no change in EKG.  I will obtain an echocardiogram.  Unless echocardiogram is abnormal, I do not think he needs a stress test.  Do not suspect angina pectoris.  I reviewed his lipids, triglycerides elevated, we discussed regarding making lifestyle changes.  If triglycerides remain elevated, we could consider addition of Vascepa.  He will follow-up with his PCP regarding this.  Blood pressures well controlled, otherwise remained stable, I will see him back on an annual basis unless echocardiogram is abnormal.  He was previously metoprolol succinate 25 mg in the evening, was inadvertently change metoprolol tartrate 25 mg in the evening, I will switch back.  [name], MD, [facility] 05/30/2019, 9:44 AM [facility]. [place] Office: [phone]

## 2019-05-30 ENCOUNTER — Ambulatory Visit: Payer: PPO | Admitting: Cardiology

## 2019-05-30 ENCOUNTER — Encounter: Payer: Self-pay | Admitting: Cardiology

## 2019-05-30 ENCOUNTER — Other Ambulatory Visit: Payer: Self-pay

## 2019-05-30 VITALS — BP 125/68 | HR 60 | Temp 97.8°F | Resp 16 | Ht 67.0 in | Wt 199.0 lb

## 2019-05-30 DIAGNOSIS — I25118 Atherosclerotic heart disease of native coronary artery with other forms of angina pectoris: Secondary | ICD-10-CM | POA: Diagnosis not present

## 2019-05-30 DIAGNOSIS — E782 Mixed hyperlipidemia: Secondary | ICD-10-CM | POA: Diagnosis not present

## 2019-05-30 DIAGNOSIS — R0609 Other forms of dyspnea: Secondary | ICD-10-CM

## 2019-05-30 DIAGNOSIS — I10 Essential (primary) hypertension: Secondary | ICD-10-CM | POA: Diagnosis not present

## 2019-05-30 MED ORDER — METOPROLOL SUCCINATE ER 25 MG PO TB24: 25.0000 mg | ORAL_TABLET | Freq: Every day | ORAL | 3 refills | Status: DC

## 2019-06-16 DIAGNOSIS — M545 Low back pain: Secondary | ICD-10-CM | POA: Diagnosis not present

## 2019-06-16 DIAGNOSIS — M5416 Radiculopathy, lumbar region: Secondary | ICD-10-CM | POA: Diagnosis not present

## 2019-06-16 DIAGNOSIS — M48061 Spinal stenosis, lumbar region without neurogenic claudication: Secondary | ICD-10-CM | POA: Diagnosis not present

## 2019-06-21 DIAGNOSIS — M48062 Spinal stenosis, lumbar region with neurogenic claudication: Secondary | ICD-10-CM | POA: Diagnosis not present

## 2019-06-28 ENCOUNTER — Encounter: Payer: Self-pay | Admitting: Cardiology

## 2019-06-29 DIAGNOSIS — B351 Tinea unguium: Secondary | ICD-10-CM | POA: Diagnosis not present

## 2019-06-30 ENCOUNTER — Other Ambulatory Visit: Payer: Self-pay | Admitting: Neurological Surgery

## 2019-07-08 NOTE — Progress Notes (Signed)
[id number] [name], [place] [address] PhoneSE:[phone] FaxXO:[phone]    Your procedure is scheduled on Wednesday, May 5th.  Report to [facility] Main Entrance "A" at 12:50 P.M., and check in at the Admitting office.  Call this number if you have problems the morning of surgery:  [phone]  Call [phone] if you have any questions prior to your surgery date Monday-Friday 8am-4pm   Remember:  Do not eat or drink after midnight the night before your surgery    Take these medicines the morning of surgery with A SIP OF WATER  acetaminophen (TYLENOL) finasteride (PROSCAR) pantoprazole (PROTONIX)  ranolazine (RANEXA) rosuvastatin (CRESTOR)   If needed - nitroGLYCERIN (NITROSTAT), Propylene Glycol (SYSTANE BALANCE)/eye drops  Follow your surgeon's instructions on when to stop Aspirin.  If no instructions were given by your surgeon then you will need to call the office to get those instructions.    As of today, STOP taking Aleve, Naproxen, Ibuprofen, Motrin, Advil, Goody's, BC's, all herbal medications, fish oil, and all vitamins.             Do not wear jewelry.            Do not wear lotions, powders, colognes, or deodorant.            Men may shave face and neck.            Do not bring valuables to the hospital.            [facility] is not responsible for any belongings or valuables.  Do NOT Smoke (Tobacco/Vapping) or drink Alcohol 24 hours prior to your procedure If you use a CPAP at night, you may bring all equipment for your overnight stay.   Contacts, glasses, dentures or bridgework may not be worn into surgery.      For patients admitted to the hospital, discharge time will be determined by your treatment team.   Patients discharged the day of surgery will not be allowed to drive home, and someone needs to stay with them for 24 hours.  Special instructions:   [place]- Preparing For  Surgery  Before surgery, you can play an important role. Because skin is not sterile, your skin needs to be as free of germs as possible. You can reduce the number of germs on your skin by washing with CHG (chlorahexidine gluconate) Soap before surgery.  CHG is an antiseptic cleaner which kills germs and bonds with the skin to continue killing germs even after washing.    Oral Hygiene is also important to reduce your risk of infection.  Remember - BRUSH YOUR TEETH THE MORNING OF SURGERY WITH YOUR REGULAR TOOTHPASTE  Please do not use if you have an allergy to CHG or antibacterial soaps. If your skin becomes reddened/irritated stop using the CHG.  Do not shave (including legs and underarms) for at least 48 hours prior to first CHG shower. It is OK to shave your face.  Please follow these instructions carefully.   1. Shower the NIGHT BEFORE SURGERY and the MORNING OF SURGERY with CHG Soap.   2. If you chose to wash your hair, wash your hair first as usual with your normal shampoo.  3. After you shampoo, rinse your hair and body thoroughly to remove the shampoo.  4. Use CHG as you would any other liquid soap. You can apply CHG directly to the skin and wash gently with a scrungie  or a clean washcloth.   5. Apply the CHG Soap to your body ONLY FROM THE NECK DOWN.  Do not use on open wounds or open sores. Avoid contact with your eyes, ears, mouth and genitals (private parts). Wash Face and genitals (private parts)  with your normal soap.   6. Wash thoroughly, paying special attention to the area where your surgery will be performed.  7. Thoroughly rinse your body with warm water from the neck down.  8. DO NOT shower/wash with your normal soap after using and rinsing off the CHG Soap.  9. Pat yourself dry with a CLEAN TOWEL.  10. Wear CLEAN PAJAMAS to bed the night before surgery, wear comfortable clothes the morning of surgery  11. Place CLEAN SHEETS on your bed the night of your first  shower and DO NOT SLEEP WITH PETS.  Day of Surgery: Shower with CHG soap as instructed above. Do not apply any deodorants/lotions.  Please wear clean clothes to the hospital/surgery center.   Remember to brush your teeth WITH YOUR REGULAR TOOTHPASTE.   Please read over the following fact sheets that you were given.

## 2019-07-11 ENCOUNTER — Encounter (HOSPITAL_COMMUNITY): Payer: Self-pay

## 2019-07-11 ENCOUNTER — Other Ambulatory Visit (HOSPITAL_COMMUNITY)
Admission: RE | Admit: 2019-07-11 | Discharge: 2019-07-11 | Disposition: A | Discharge status: Home / Self Care | Payer: PPO | Source: Ambulatory Visit | Attending: Neurological Surgery | Admitting: Neurological Surgery

## 2019-07-11 ENCOUNTER — Other Ambulatory Visit: Payer: Self-pay

## 2019-07-11 ENCOUNTER — Encounter (HOSPITAL_COMMUNITY)
Admission: RE | Admit: 2019-07-11 | Discharge: 2019-07-11 | Disposition: A | Discharge status: Home / Self Care | Payer: PPO | Source: Ambulatory Visit | Attending: Neurological Surgery | Admitting: Neurological Surgery

## 2019-07-11 ENCOUNTER — Ambulatory Visit (HOSPITAL_COMMUNITY)
Admission: RE | Admit: 2019-07-11 | Discharge: 2019-07-11 | Disposition: A | Discharge status: Home / Self Care | Payer: PPO | Source: Ambulatory Visit | Attending: Neurological Surgery | Admitting: Neurological Surgery

## 2019-07-11 DIAGNOSIS — Z01818 Encounter for other preprocedural examination: Secondary | ICD-10-CM | POA: Diagnosis not present

## 2019-07-11 DIAGNOSIS — I444 Left anterior fascicular block: Secondary | ICD-10-CM | POA: Diagnosis not present

## 2019-07-11 DIAGNOSIS — I10 Essential (primary) hypertension: Secondary | ICD-10-CM | POA: Diagnosis not present

## 2019-07-11 DIAGNOSIS — Z7982 Long term (current) use of aspirin: Secondary | ICD-10-CM | POA: Diagnosis not present

## 2019-07-11 DIAGNOSIS — I2510 Atherosclerotic heart disease of native coronary artery without angina pectoris: Secondary | ICD-10-CM | POA: Diagnosis not present

## 2019-07-11 DIAGNOSIS — R9431 Abnormal electrocardiogram [ECG] [EKG]: Secondary | ICD-10-CM | POA: Insufficient documentation

## 2019-07-11 DIAGNOSIS — Z951 Presence of aortocoronary bypass graft: Secondary | ICD-10-CM | POA: Diagnosis not present

## 2019-07-11 DIAGNOSIS — M48061 Spinal stenosis, lumbar region without neurogenic claudication: Secondary | ICD-10-CM

## 2019-07-11 DIAGNOSIS — E785 Hyperlipidemia, unspecified: Secondary | ICD-10-CM | POA: Insufficient documentation

## 2019-07-11 DIAGNOSIS — I259 Chronic ischemic heart disease, unspecified: Secondary | ICD-10-CM | POA: Diagnosis not present

## 2019-07-11 DIAGNOSIS — Z20822 Contact with and (suspected) exposure to covid-19: Secondary | ICD-10-CM | POA: Diagnosis not present

## 2019-07-11 DIAGNOSIS — R001 Bradycardia, unspecified: Secondary | ICD-10-CM | POA: Insufficient documentation

## 2019-07-11 LAB — CBC WITH DIFFERENTIAL/PLATELET
Abs Immature Granulocytes: 0.01 10*3/uL (ref 0.00–0.07)
Basophils Absolute: 0 10*3/uL (ref 0.0–0.1)
Basophils Relative: 0 %
Eosinophils Absolute: 0.2 10*3/uL (ref 0.0–0.5)
Eosinophils Relative: 3 %
HCT: 38.2 % — ABNORMAL LOW (ref 39.0–52.0)
Hemoglobin: 12.7 g/dL — ABNORMAL LOW (ref 13.0–17.0)
Immature Granulocytes: 0 %
Lymphocytes Relative: 36 %
Lymphs Abs: 2.1 10*3/uL (ref 0.7–4.0)
MCH: 33.3 pg (ref 26.0–34.0)
MCHC: 33.2 g/dL (ref 30.0–36.0)
MCV: 100.3 fL — ABNORMAL HIGH (ref 80.0–100.0)
Monocytes Absolute: 0.5 10*3/uL (ref 0.1–1.0)
Monocytes Relative: 9 %
Neutro Abs: 3 10*3/uL (ref 1.7–7.7)
Neutrophils Relative %: 52 %
Platelets: 214 10*3/uL (ref 150–400)
RBC: 3.81 MIL/uL — ABNORMAL LOW (ref 4.22–5.81)
RDW: 12.9 % (ref 11.5–15.5)
WBC: 5.8 10*3/uL (ref 4.0–10.5)
nRBC: 0 % (ref 0.0–0.2)

## 2019-07-11 LAB — BASIC METABOLIC PANEL
Anion gap: 6 (ref 5–15)
BUN: 22 mg/dL (ref 8–23)
CO2: 27 mmol/L (ref 22–32)
Calcium: 9.4 mg/dL (ref 8.9–10.3)
Chloride: 102 mmol/L (ref 98–111)
Creatinine, Ser: 1.26 mg/dL — ABNORMAL HIGH (ref 0.61–1.24)
GFR calc Af Amer: >60 mL/min (ref >60)
GFR calc non Af Amer: 55 mL/min — ABNORMAL LOW (ref >60)
Glucose, Bld: 108 mg/dL — ABNORMAL HIGH (ref 70–99)
Potassium: 4.5 mmol/L (ref 3.5–5.1)
Sodium: 135 mmol/L (ref 135–145)

## 2019-07-11 LAB — PROTIME-INR
INR: 1 (ref 0.8–1.2)
Prothrombin Time: 13.2 seconds (ref 11.4–15.2)

## 2019-07-11 LAB — SURGICAL PCR SCREEN
MRSA, PCR: NEGATIVE
Staphylococcus aureus: NEGATIVE

## 2019-07-11 LAB — SARS CORONAVIRUS 2 (TAT 6-24 HRS): SARS Coronavirus 2: NEGATIVE

## 2019-07-11 NOTE — Progress Notes (Signed)
PCP - Dr. Domenick Gong Cardiologist - Denies  PPM/ICD - Denies  Chest x-ray - 07/11/19 EKG - 07/11/19 Stress Test - Denies ECHO - 05/30/19 Cardiac Cath - Denies  Sleep Study - Denies  Pt denies being diabetic.  Blood Thinner Instructions: N/A Aspirin Instructions: Continue as prescribed.  ERAS Protcol - No  COVID TEST- 07/11/19   Coronavirus Screening  Have you experienced the following symptoms:  Cough yes/no: No Fever (>100.19F)  yes/no: No Runny nose yes/no: No Sore throat yes/no: No Difficulty breathing/shortness of breath  yes/no: No  Have you or a family member traveled in the last 14 days and where? yes/no: No   If the patient indicates "YES" to the above questions, their PAT will be rescheduled to limit the exposure to others and, the surgeon will be notified. THE PATIENT WILL NEED TO BE ASYMPTOMATIC FOR 14 DAYS.   If the patient is not experiencing any of these symptoms, the PAT nurse will instruct them to NOT bring anyone with them to their appointment since they may have these symptoms or traveled as well.   Please remind your patients and families that hospital visitation restrictions are in effect and the importance of the restrictions.     Anesthesia review: Yes, abnormal EKG  Patient denies shortness of breath, fever, cough and chest pain at PAT appointment   All instructions explained to the patient, with a verbal understanding of the material. Patient agrees to go over the instructions while at home for a better understanding. Patient also instructed to self quarantine after being tested for COVID-19. The opportunity to ask questions was provided.

## 2019-07-12 NOTE — Anesthesia Preprocedure Evaluation (Addendum)
Anesthesia Evaluation  Patient identified by MRN, date of birth, ID band Patient awake    Reviewed: Allergy & Precautions, H&P , NPO status , Patient's Chart, lab work & pertinent test results, reviewed documented beta blocker date and time   Airway Mallampati: II  TM Distance: >3 FB Neck ROM: full    Dental no notable dental hx.    Pulmonary neg pulmonary ROS,    Pulmonary exam normal breath sounds clear to auscultation       Cardiovascular Exercise Tolerance: Good hypertension, Pt. on medications and Pt. on home beta blockers + CAD   Rhythm:regular Rate:Normal     Neuro/Psych negative neurological ROS  negative psych ROS   GI/Hepatic Neg liver ROS, GERD  Medicated,  Endo/Other  negative endocrine ROS  Renal/GU negative Renal ROS  negative genitourinary   Musculoskeletal   Abdominal   Peds  Hematology negative hematology ROS (+)   Anesthesia Other Findings   Reproductive/Obstetrics negative OB ROS                            Anesthesia Physical Anesthesia Plan  ASA: III  Anesthesia Plan: General   Post-op Pain Management:    Induction:   PONV Risk Score and Plan: 2 and Ondansetron, Dexamethasone and Treatment may vary due to age or medical condition  Airway Management Planned: Oral ETT  Additional Equipment:   Intra-op Plan:   Post-operative Plan:   Informed Consent: I have reviewed the patients History and Physical, chart, labs and discussed the procedure including the risks, benefits and alternatives for the proposed anesthesia with the patient or authorized representative who has indicated his/her understanding and acceptance.     Dental Advisory Given  Plan Discussed with: CRNA, Anesthesiologist and Surgeon  Anesthesia Plan Comments: (Follows with Dr. Einar Gip for history of CAD s/p CABG 1996, chronic stable angina, HTN, HLD. Dr. Einar Gip cleared pt for surgery per letter  in [place] 06/28/19, "ZOHAN EMERT is at low risk, from a cardiac standpoint, For his upcoming procedure lumbar spinal surgery and you may stop his ASA for 6 days prior to procedure if necessary, but prefer to continue if possible.  It is ok to proceed without further cardiac testing."  Preop labs reviewed, unremarkable.  EKG 07/11/19: Sinus bradycardia. Rate 54. Left anterior fascicular block  Echocardiogram 04/08/2016: 1. Left ventricle cavity is normal in size. Mild concentric hypertrophy of the left ventricle. Normal global wall motion. Calculated EF 55%. 2. Left atrial cavity is normal in size. An aneurysm of inter atrial septum is present. No convincing evidence for patent foramen ovale. 3. Right atrial cavity appears to be slightly dilated. 4. Right ventricle cavity is slightly dilated. Normal right ventricular function. 5. Trace aortic regurgitation. Mild calcification of the aortic valve annulus. Mild aortic valve leaflet calcification. Mildly restricted aortic valve leaflets. Trace aortic valve stenosis with peak gradient 16 and mean gradient 8 mm of Hg. 6. Trace mitral regurgitation. 7. Mild tricuspid regurgitation. Mild pulmonary hypertension with approx. PA syst. pressure of 31 mm of Hg.  c.f. echo. of 08/09/2015, RV, RA appear to be slightly dilated. There is trace AR.  Treadmill stress test 08/10/2015: 1. Resting EKG, normal sinus rhythm, incomplete right bundle branch block.  Stress EKG was nondiagnostic for ischemia due to inability to achieve target heart rate 73% MPHR).  Patient exercised on Bruce protocol for 8 minutes and achieved 10.16 mets.  There was no ST-T changes at peak exercise.  Stress terminated due to fatigue.  Stress converted to [facility] pharmacologic stress.  There was no additional ST-T wave changes with from plastic stress protocol. Stress symptoms included dyspnea. 2. The perfusion imaging study demonstrates a very small sized very mild ischemia in the inferior wall.   Left ventricular systolic function calculated QGS was normal at 70%. This is a low risk study.  Carotid artery duplex 08/17/2014: No hemodynamically significant arterial disease in the internal carotid artery bilaterally. Mild soft plaque noted. )       Anesthesia Quick Evaluation

## 2019-07-12 NOTE — Progress Notes (Signed)
Anesthesia Chart Review:  Follows with Dr. Einar Gip for history of CAD s/p CABG 1996, chronic stable angina, HTN, HLD. Dr. Einar Gip cleared pt for surgery per letter in [place] 06/28/19, "[name] is at low risk, from a cardiac standpoint, For his upcoming procedure lumbar spinal surgery and you may stop his ASA for 6 days prior to procedure if necessary, but prefer to continue if possible.  It is ok to proceed without further cardiac testing."  Preop labs reviewed, unremarkable.  EKG 07/11/19: Sinus bradycardia. Rate 54. Left anterior fascicular block  Echocardiogram 04/08/2016: 1. Left ventricle cavity is normal in size. Mild concentric hypertrophy of the left ventricle. Normal global wall motion. Calculated EF 55%. 2. Left atrial cavity is normal in size. An aneurysm of inter atrial septum is present. No convincing evidence for patent foramen ovale. 3. Right atrial cavity appears to be slightly dilated. 4. Right ventricle cavity is slightly dilated. Normal right ventricular function. 5. Trace aortic regurgitation. Mild calcification of the aortic valve annulus. Mild aortic valve leaflet calcification. Mildly restricted aortic valve leaflets. Trace aortic valve stenosis with peak gradient 16 and mean gradient 8 mm of Hg. 6. Trace mitral regurgitation. 7. Mild tricuspid regurgitation. Mild pulmonary hypertension with approx. PA syst. pressure of 31 mm of Hg.  c.f. echo. of 08/09/2015, RV, RA appear to be slightly dilated. There is trace AR.  Treadmill stress test 08/10/2015: 1. Resting EKG, normal sinus rhythm, incomplete right bundle branch block.  Stress EKG was nondiagnostic for ischemia due to inability to achieve target heart rate 73% MPHR).  Patient exercised on Bruce protocol for 8 minutes and achieved 10.16 mets.  There was no ST-T changes at peak exercise. Stress terminated due to fatigue.  Stress converted to [facility] pharmacologic stress.  There was no additional ST-T wave changes with from  plastic stress protocol. Stress symptoms included dyspnea. 2. The perfusion imaging study demonstrates a very small sized very mild ischemia in the inferior wall.  Left ventricular systolic function calculated QGS was normal at 70%. This is a low risk study.  Carotid artery duplex 08/17/2014: No hemodynamically significant arterial disease in the internal carotid artery bilaterally. Mild soft plaque noted.   Wynonia Musty [facility] Short Stay Center/Anesthesiology Phone [phone] 07/12/2019 1:05 PM

## 2019-07-13 ENCOUNTER — Ambulatory Visit (HOSPITAL_COMMUNITY): Payer: PPO

## 2019-07-13 ENCOUNTER — Ambulatory Visit (HOSPITAL_COMMUNITY)
Admission: RE | Admit: 2019-07-13 | Discharge: 2019-07-14 | Disposition: A | Discharge status: Home / Self Care | Payer: PPO | Attending: Neurological Surgery | Admitting: Neurological Surgery

## 2019-07-13 ENCOUNTER — Other Ambulatory Visit: Payer: Self-pay

## 2019-07-13 ENCOUNTER — Ambulatory Visit (HOSPITAL_COMMUNITY): Payer: PPO | Admitting: Anesthesiology

## 2019-07-13 ENCOUNTER — Ambulatory Visit (HOSPITAL_COMMUNITY): Payer: PPO | Admitting: Physician Assistant

## 2019-07-13 ENCOUNTER — Encounter (HOSPITAL_COMMUNITY)
Admission: RE | Disposition: A | Discharge status: Home / Self Care | Payer: Self-pay | Source: Home / Self Care | Attending: Neurological Surgery

## 2019-07-13 ENCOUNTER — Encounter (HOSPITAL_COMMUNITY): Payer: Self-pay | Admitting: Neurological Surgery

## 2019-07-13 DIAGNOSIS — Z881 Allergy status to other antibiotic agents status: Secondary | ICD-10-CM | POA: Diagnosis not present

## 2019-07-13 DIAGNOSIS — R0600 Dyspnea, unspecified: Secondary | ICD-10-CM | POA: Diagnosis not present

## 2019-07-13 DIAGNOSIS — M1990 Unspecified osteoarthritis, unspecified site: Secondary | ICD-10-CM | POA: Diagnosis not present

## 2019-07-13 DIAGNOSIS — Z955 Presence of coronary angioplasty implant and graft: Secondary | ICD-10-CM | POA: Insufficient documentation

## 2019-07-13 DIAGNOSIS — I444 Left anterior fascicular block: Secondary | ICD-10-CM | POA: Diagnosis not present

## 2019-07-13 DIAGNOSIS — Z7982 Long term (current) use of aspirin: Secondary | ICD-10-CM | POA: Diagnosis not present

## 2019-07-13 DIAGNOSIS — Z8249 Family history of ischemic heart disease and other diseases of the circulatory system: Secondary | ICD-10-CM | POA: Diagnosis not present

## 2019-07-13 DIAGNOSIS — E785 Hyperlipidemia, unspecified: Secondary | ICD-10-CM | POA: Diagnosis not present

## 2019-07-13 DIAGNOSIS — M48061 Spinal stenosis, lumbar region without neurogenic claudication: Secondary | ICD-10-CM | POA: Diagnosis not present

## 2019-07-13 DIAGNOSIS — Z885 Allergy status to narcotic agent status: Secondary | ICD-10-CM | POA: Diagnosis not present

## 2019-07-13 DIAGNOSIS — Z888 Allergy status to other drugs, medicaments and biological substances status: Secondary | ICD-10-CM | POA: Diagnosis not present

## 2019-07-13 DIAGNOSIS — Z9889 Other specified postprocedural states: Secondary | ICD-10-CM

## 2019-07-13 DIAGNOSIS — Z86010 Personal history of colonic polyps: Secondary | ICD-10-CM | POA: Insufficient documentation

## 2019-07-13 DIAGNOSIS — Z79899 Other long term (current) drug therapy: Secondary | ICD-10-CM | POA: Insufficient documentation

## 2019-07-13 DIAGNOSIS — Z951 Presence of aortocoronary bypass graft: Secondary | ICD-10-CM | POA: Diagnosis not present

## 2019-07-13 DIAGNOSIS — Z800 Family history of malignant neoplasm of digestive organs: Secondary | ICD-10-CM | POA: Insufficient documentation

## 2019-07-13 DIAGNOSIS — Z880 Allergy status to penicillin: Secondary | ICD-10-CM | POA: Diagnosis not present

## 2019-07-13 DIAGNOSIS — Z419 Encounter for procedure for purposes other than remedying health state, unspecified: Secondary | ICD-10-CM

## 2019-07-13 DIAGNOSIS — M5416 Radiculopathy, lumbar region: Secondary | ICD-10-CM | POA: Diagnosis not present

## 2019-07-13 DIAGNOSIS — Z9841 Cataract extraction status, right eye: Secondary | ICD-10-CM | POA: Insufficient documentation

## 2019-07-13 DIAGNOSIS — Z961 Presence of intraocular lens: Secondary | ICD-10-CM | POA: Diagnosis not present

## 2019-07-13 DIAGNOSIS — Z85828 Personal history of other malignant neoplasm of skin: Secondary | ICD-10-CM | POA: Diagnosis not present

## 2019-07-13 DIAGNOSIS — I10 Essential (primary) hypertension: Secondary | ICD-10-CM | POA: Insufficient documentation

## 2019-07-13 DIAGNOSIS — Z9842 Cataract extraction status, left eye: Secondary | ICD-10-CM | POA: Diagnosis not present

## 2019-07-13 DIAGNOSIS — K219 Gastro-esophageal reflux disease without esophagitis: Secondary | ICD-10-CM | POA: Insufficient documentation

## 2019-07-13 DIAGNOSIS — I2510 Atherosclerotic heart disease of native coronary artery without angina pectoris: Secondary | ICD-10-CM | POA: Diagnosis not present

## 2019-07-13 HISTORY — PX: LUMBAR LAMINECTOMY/DECOMPRESSION MICRODISCECTOMY: SHX5026

## 2019-07-13 SURGERY — LUMBAR LAMINECTOMY/DECOMPRESSION MICRODISCECTOMY 2 LEVELS
Anesthesia: General | Site: Back

## 2019-07-13 MED ORDER — METHOCARBAMOL 1000 MG/10ML IJ SOLN
500.0000 mg | Freq: Four times a day (QID) | INTRAVENOUS | Status: DC | PRN
  Filled 2019-07-13: qty 5

## 2019-07-13 MED ORDER — ACETAMINOPHEN 650 MG RE SUPP: 650.0000 mg | RECTAL | Status: DC | PRN

## 2019-07-13 MED ORDER — PROPOFOL 10 MG/ML IV BOLUS
INTRAVENOUS | Status: AC
  Filled 2019-07-13: qty 20

## 2019-07-13 MED ORDER — LOSARTAN POTASSIUM 50 MG PO TABS
25.0000 mg | ORAL_TABLET | Freq: Every evening | ORAL | Status: DC
  Administered 2019-07-13: 25 mg via ORAL
  Filled 2019-07-13: qty 1

## 2019-07-13 MED ORDER — MORPHINE SULFATE (PF) 2 MG/ML IV SOLN: 2.0000 mg | INTRAVENOUS | Status: DC | PRN

## 2019-07-13 MED ORDER — ONDANSETRON HCL 4 MG/2ML IJ SOLN: 4.0000 mg | Freq: Four times a day (QID) | INTRAMUSCULAR | Status: DC | PRN

## 2019-07-13 MED ORDER — METOPROLOL SUCCINATE ER 25 MG PO TB24
25.0000 mg | ORAL_TABLET | Freq: Every day | ORAL | Status: DC
  Administered 2019-07-13: 25 mg via ORAL
  Filled 2019-07-13: qty 1

## 2019-07-13 MED ORDER — FENTANYL CITRATE (PF) 100 MCG/2ML IJ SOLN
INTRAMUSCULAR | Status: DC | PRN
  Administered 2019-07-13: 100 ug via INTRAVENOUS
  Administered 2019-07-13: 50 ug via INTRAVENOUS

## 2019-07-13 MED ORDER — ROCURONIUM BROMIDE 100 MG/10ML IV SOLN
INTRAVENOUS | Status: DC | PRN
  Administered 2019-07-13: 70 mg via INTRAVENOUS

## 2019-07-13 MED ORDER — SENNA 8.6 MG PO TABS
1.0000 | ORAL_TABLET | Freq: Two times a day (BID) | ORAL | Status: DC
  Administered 2019-07-13: 8.6 mg via ORAL
  Filled 2019-07-13: qty 1

## 2019-07-13 MED ORDER — HEMOSTATIC AGENTS (NO CHARGE) OPTIME
TOPICAL | Status: DC | PRN
  Administered 2019-07-13: 1 via TOPICAL

## 2019-07-13 MED ORDER — DEXAMETHASONE SODIUM PHOSPHATE 10 MG/ML IJ SOLN
10.0000 mg | Freq: Once | INTRAMUSCULAR | Status: AC
  Administered 2019-07-13: 10 mg via INTRAVENOUS

## 2019-07-13 MED ORDER — RANOLAZINE ER 500 MG PO TB12
1000.0000 mg | ORAL_TABLET | Freq: Two times a day (BID) | ORAL | Status: DC
  Administered 2019-07-13: 1000 mg via ORAL
  Filled 2019-07-13 (×3): qty 2

## 2019-07-13 MED ORDER — ONDANSETRON HCL 4 MG/2ML IJ SOLN
INTRAMUSCULAR | Status: DC | PRN
  Administered 2019-07-13: 4 mg via INTRAVENOUS

## 2019-07-13 MED ORDER — BUPIVACAINE HCL (PF) 0.25 % IJ SOLN
INTRAMUSCULAR | Status: AC
  Filled 2019-07-13: qty 30

## 2019-07-13 MED ORDER — SODIUM CHLORIDE 0.9 % IV SOLN: 250.0000 mL | INTRAVENOUS | Status: DC

## 2019-07-13 MED ORDER — PANTOPRAZOLE SODIUM 40 MG PO TBEC
40.0000 mg | DELAYED_RELEASE_TABLET | Freq: Every day | ORAL | Status: DC
  Administered 2019-07-14: 06:00:00 40 mg via ORAL
  Filled 2019-07-13: qty 1

## 2019-07-13 MED ORDER — CHLORHEXIDINE GLUCONATE CLOTH 2 % EX PADS: 6.0000 | MEDICATED_PAD | Freq: Once | CUTANEOUS | Status: DC

## 2019-07-13 MED ORDER — SODIUM CHLORIDE 0.9% FLUSH: 3.0000 mL | INTRAVENOUS | Status: DC | PRN

## 2019-07-13 MED ORDER — DEXAMETHASONE SODIUM PHOSPHATE 10 MG/ML IJ SOLN
INTRAMUSCULAR | Status: AC
  Filled 2019-07-13: qty 1

## 2019-07-13 MED ORDER — ONDANSETRON HCL 4 MG PO TABS: 4.0000 mg | ORAL_TABLET | Freq: Four times a day (QID) | ORAL | Status: DC | PRN

## 2019-07-13 MED ORDER — LIDOCAINE 2% (20 MG/ML) 5 ML SYRINGE
INTRAMUSCULAR | Status: DC | PRN
  Administered 2019-07-13: 100 mg via INTRAVENOUS

## 2019-07-13 MED ORDER — THROMBIN 5000 UNITS EX SOLR
OROMUCOSAL | Status: DC | PRN
  Administered 2019-07-13: 5 mL via TOPICAL

## 2019-07-13 MED ORDER — PHENOL 1.4 % MT LIQD: 1.0000 | OROMUCOSAL | Status: DC | PRN

## 2019-07-13 MED ORDER — THROMBIN 5000 UNITS EX SOLR
CUTANEOUS | Status: DC | PRN
  Administered 2019-07-13 (×2): 5000 [IU] via TOPICAL

## 2019-07-13 MED ORDER — POTASSIUM CHLORIDE IN NACL 20-0.9 MEQ/L-% IV SOLN: INTRAVENOUS | Status: DC

## 2019-07-13 MED ORDER — MENTHOL 3 MG MT LOZG
1.0000 | LOZENGE | OROMUCOSAL | Status: DC | PRN
  Filled 2019-07-13: qty 9

## 2019-07-13 MED ORDER — ACETAMINOPHEN 325 MG PO TABS
650.0000 mg | ORAL_TABLET | ORAL | Status: DC | PRN
  Administered 2019-07-13 – 2019-07-14 (×2): 650 mg via ORAL
  Filled 2019-07-13 (×2): qty 2

## 2019-07-13 MED ORDER — EZETIMIBE 10 MG PO TABS
5.0000 mg | ORAL_TABLET | Freq: Every evening | ORAL | Status: DC
  Administered 2019-07-13: 5 mg via ORAL
  Filled 2019-07-13 (×2): qty 0.5

## 2019-07-13 MED ORDER — LIDOCAINE 2% (20 MG/ML) 5 ML SYRINGE
INTRAMUSCULAR | Status: AC
  Filled 2019-07-13: qty 5

## 2019-07-13 MED ORDER — ONDANSETRON HCL 4 MG/2ML IJ SOLN
INTRAMUSCULAR | Status: AC
  Filled 2019-07-13: qty 2

## 2019-07-13 MED ORDER — SODIUM CHLORIDE 0.9% FLUSH
3.0000 mL | Freq: Two times a day (BID) | INTRAVENOUS | Status: DC
  Administered 2019-07-13: 3 mL via INTRAVENOUS

## 2019-07-13 MED ORDER — THROMBIN 5000 UNITS EX SOLR
CUTANEOUS | Status: AC
  Filled 2019-07-13: qty 15000

## 2019-07-13 MED ORDER — OXYCODONE HCL 5 MG PO TABS
5.0000 mg | ORAL_TABLET | ORAL | Status: DC | PRN
  Filled 2019-07-13: qty 1

## 2019-07-13 MED ORDER — LACTATED RINGERS IV SOLN: INTRAVENOUS | Status: DC

## 2019-07-13 MED ORDER — SUGAMMADEX SODIUM 200 MG/2ML IV SOLN
INTRAVENOUS | Status: DC | PRN
  Administered 2019-07-13: 200 mg via INTRAVENOUS

## 2019-07-13 MED ORDER — VANCOMYCIN HCL IN DEXTROSE 1-5 GM/200ML-% IV SOLN
1000.0000 mg | INTRAVENOUS | Status: AC
  Administered 2019-07-13: 1000 mg via INTRAVENOUS
  Filled 2019-07-13: qty 200

## 2019-07-13 MED ORDER — ROCURONIUM BROMIDE 10 MG/ML (PF) SYRINGE
PREFILLED_SYRINGE | INTRAVENOUS | Status: AC
  Filled 2019-07-13: qty 10

## 2019-07-13 MED ORDER — BUPIVACAINE HCL (PF) 0.25 % IJ SOLN
INTRAMUSCULAR | Status: DC | PRN
  Administered 2019-07-13: 7 mL
  Administered 2019-07-13: 10 mL

## 2019-07-13 MED ORDER — PROPOFOL 10 MG/ML IV BOLUS
INTRAVENOUS | Status: DC | PRN
  Administered 2019-07-13: 120 mg via INTRAVENOUS

## 2019-07-13 MED ORDER — EPHEDRINE SULFATE-NACL 50-0.9 MG/10ML-% IV SOSY
PREFILLED_SYRINGE | INTRAVENOUS | Status: DC | PRN
  Administered 2019-07-13: 10 mg via INTRAVENOUS
  Administered 2019-07-13: 5 mg via INTRAVENOUS

## 2019-07-13 MED ORDER — FENTANYL CITRATE (PF) 250 MCG/5ML IJ SOLN
INTRAMUSCULAR | Status: AC
  Filled 2019-07-13: qty 5

## 2019-07-13 MED ORDER — POLYVINYL ALCOHOL 1.4 % OP SOLN
1.0000 [drp] | Freq: Every day | OPHTHALMIC | Status: DC | PRN
  Administered 2019-07-13: 1 [drp] via OPHTHALMIC
  Filled 2019-07-13: qty 15

## 2019-07-13 MED ORDER — VANCOMYCIN HCL IN DEXTROSE 1-5 GM/200ML-% IV SOLN
1000.0000 mg | Freq: Once | INTRAVENOUS | Status: AC
  Administered 2019-07-13: 1000 mg via INTRAVENOUS
  Filled 2019-07-13: qty 200

## 2019-07-13 MED ORDER — FINASTERIDE 5 MG PO TABS
5.0000 mg | ORAL_TABLET | Freq: Every day | ORAL | Status: DC
  Administered 2019-07-14: 5 mg via ORAL
  Filled 2019-07-13: qty 1

## 2019-07-13 MED ORDER — METHOCARBAMOL 500 MG PO TABS
500.0000 mg | ORAL_TABLET | Freq: Four times a day (QID) | ORAL | Status: DC | PRN
  Administered 2019-07-13 – 2019-07-14 (×2): 500 mg via ORAL
  Filled 2019-07-13 (×2): qty 1

## 2019-07-13 MED ORDER — 0.9 % SODIUM CHLORIDE (POUR BTL) OPTIME
TOPICAL | Status: DC | PRN
  Administered 2019-07-13: 1000 mL

## 2019-07-13 MED ORDER — SODIUM CHLORIDE 0.9 % IV SOLN
INTRAVENOUS | Status: DC | PRN
  Administered 2019-07-13: 500 mL

## 2019-07-13 MED ORDER — CELECOXIB 200 MG PO CAPS
200.0000 mg | ORAL_CAPSULE | Freq: Two times a day (BID) | ORAL | Status: DC
  Administered 2019-07-13: 200 mg via ORAL
  Filled 2019-07-13: qty 1

## 2019-07-13 MED ORDER — PHENYLEPHRINE HCL-NACL 10-0.9 MG/250ML-% IV SOLN
INTRAVENOUS | Status: DC | PRN
  Administered 2019-07-13: 25 ug/min via INTRAVENOUS

## 2019-07-13 SURGICAL SUPPLY — 50 items
BAG DECANTER FOR FLEXI CONT (MISCELLANEOUS) ×3 IMPLANT
BAND RUBBER #18 3X1/16 STRL (MISCELLANEOUS) ×2 IMPLANT
BENZOIN TINCTURE PRP APPL 2/3 (GAUZE/BANDAGES/DRESSINGS) ×3 IMPLANT
BUR CARBIDE MATCH 3.0 (BURR) ×3 IMPLANT
CANISTER SUCT 3000ML PPV (MISCELLANEOUS) ×3 IMPLANT
CARTRIDGE OIL MAESTRO DRILL (MISCELLANEOUS) ×1 IMPLANT
CLOSURE WOUND 1/2 X4 (GAUZE/BANDAGES/DRESSINGS) ×1
COVER WAND RF STERILE (DRAPES) ×3 IMPLANT
DERMABOND ADVANCED (GAUZE/BANDAGES/DRESSINGS) ×2
DERMABOND ADVANCED .7 DNX12 (GAUZE/BANDAGES/DRESSINGS) IMPLANT
DIFFUSER DRILL AIR PNEUMATIC (MISCELLANEOUS) ×3 IMPLANT
DRAPE LAPAROTOMY 100X72X124 (DRAPES) ×3 IMPLANT
DRAPE MICROSCOPE LEICA (MISCELLANEOUS) ×1 IMPLANT
DRAPE SURG 17X23 STRL (DRAPES) ×3 IMPLANT
DRSG OPSITE POSTOP 4X6 (GAUZE/BANDAGES/DRESSINGS) ×2 IMPLANT
DURAPREP 26ML APPLICATOR (WOUND CARE) ×3 IMPLANT
ELECT REM PT RETURN 9FT ADLT (ELECTROSURGICAL) ×3
ELECTRODE REM PT RTRN 9FT ADLT (ELECTROSURGICAL) ×1 IMPLANT
GAUZE 4X4 16PLY RFD (DISPOSABLE) IMPLANT
GLOVE BIO SURGEON STRL SZ7 (GLOVE) ×2 IMPLANT
GLOVE BIO SURGEON STRL SZ8 (GLOVE) ×3 IMPLANT
GLOVE BIOGEL PI IND STRL 7.0 (GLOVE) IMPLANT
GLOVE BIOGEL PI IND STRL 7.5 (GLOVE) IMPLANT
GLOVE BIOGEL PI INDICATOR 7.0 (GLOVE) ×6
GLOVE BIOGEL PI INDICATOR 7.5 (GLOVE) ×6
GOWN STRL REUS W/ TWL LRG LVL3 (GOWN DISPOSABLE) IMPLANT
GOWN STRL REUS W/ TWL XL LVL3 (GOWN DISPOSABLE) ×1 IMPLANT
GOWN STRL REUS W/TWL 2XL LVL3 (GOWN DISPOSABLE) IMPLANT
GOWN STRL REUS W/TWL LRG LVL3 (GOWN DISPOSABLE) ×6
GOWN STRL REUS W/TWL XL LVL3 (GOWN DISPOSABLE) ×6
HEMOSTAT POWDER KIT SURGIFOAM (HEMOSTASIS) ×2 IMPLANT
KIT BASIN OR (CUSTOM PROCEDURE TRAY) ×3 IMPLANT
KIT TURNOVER KIT B (KITS) ×3 IMPLANT
NDL HYPO 25X1 1.5 SAFETY (NEEDLE) ×1 IMPLANT
NDL SPNL 20GX3.5 QUINCKE YW (NEEDLE) IMPLANT
NEEDLE HYPO 25X1 1.5 SAFETY (NEEDLE) ×3 IMPLANT
NEEDLE SPNL 20GX3.5 QUINCKE YW (NEEDLE) ×3 IMPLANT
NS IRRIG 1000ML POUR BTL (IV SOLUTION) ×3 IMPLANT
OIL CARTRIDGE MAESTRO DRILL (MISCELLANEOUS)
PACK LAMINECTOMY NEURO (CUSTOM PROCEDURE TRAY) ×3 IMPLANT
PAD ARMBOARD 7.5X6 YLW CONV (MISCELLANEOUS) ×3 IMPLANT
SPONGE SURGIFOAM ABS GEL SZ50 (HEMOSTASIS) ×2 IMPLANT
STRIP CLOSURE SKIN 1/2X4 (GAUZE/BANDAGES/DRESSINGS) ×2 IMPLANT
SUT VIC AB 0 CT1 18XCR BRD8 (SUTURE) ×1 IMPLANT
SUT VIC AB 0 CT1 8-18 (SUTURE) ×3
SUT VIC AB 2-0 CP2 18 (SUTURE) ×3 IMPLANT
SUT VIC AB 3-0 SH 8-18 (SUTURE) ×3 IMPLANT
TOWEL GREEN STERILE (TOWEL DISPOSABLE) ×3 IMPLANT
TOWEL GREEN STERILE FF (TOWEL DISPOSABLE) ×3 IMPLANT
WATER STERILE IRR 1000ML POUR (IV SOLUTION) ×3 IMPLANT

## 2019-07-13 NOTE — Transfer of Care (Signed)
Immediate Anesthesia Transfer of Care Note  Patient: [name]  Procedure(s) Performed: Laminectomy and Foraminotomy - Lumbar Two-Lumbar Three - Lumbar Three-Lumbar Four (N/A Back)  Patient Location: PACU  Anesthesia Type:General  Level of Consciousness: drowsy  Airway & Oxygen Therapy: Patient Spontanous Breathing and Patient connected to face mask oxygen  Post-op Assessment: Report given to RN and Post -op Vital signs reviewed and stable  Post vital signs: Reviewed and stable  Last Vitals:  Vitals Value Taken Time  BP 178/80 07/13/19 1552  Temp    Pulse 58 07/13/19 1553  Resp 16 07/13/19 1553  SpO2 100 % 07/13/19 1553  Vitals shown include unvalidated device data.  Last Pain:  Vitals:   07/13/19 1200  TempSrc:   PainSc: 0-No pain         Complications: No apparent anesthesia complications

## 2019-07-13 NOTE — H&P (Signed)
Subjective: Patient is a 74 y.o. male admitted for spinal stenosis. Onset of symptoms was several months ago, gradually worsening since that time.  The pain is rated severe, and is located at the across the lower back and radiates to legs. The pain is described as aching and occurs all day. The symptoms have been progressive. Symptoms are exacerbated by exercise. MRI or CT showed spinal stenosis L2-3 L3-4  Past Medical History:  Diagnosis Date  . Anginal pain ([place])   . Arthritis   . Basal cell carcinoma    back of neck  . Blood transfusion without reported diagnosis 2006   during open heart surgery  . CAD (coronary artery disease)   . Diverticulosis 2013   Noted on Colonoscopy  . Dyspnea    with activity and walking, sometime has SOB walking to mailbox and back to the house  . GERD (gastroesophageal reflux disease)   . History of colon polyps 2008   Noted on Colonoscopy  . History of esophageal stricture 2003   Noted on EGD  . Hyperlipidemia   . Hypertension   . IVCD (intraventricular conduction defect) 05/2018   Noted on EKG  . 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 Left axis deviation 05/2018   Noted on EKG  . 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 LVH (left ventricular hypertrophy) 05/2018   Noted on EKG  . Phimosis   . Pneumonia   . Tuberculosis 1956  . Wears glasses   . Wheezing     Past Surgical History:  Procedure Laterality Date  . ANGIOPLASTY  2010   3 stents placed  . BACK SURGERY    . CATARACT EXTRACTION W/ INTRAOCULAR LENS  IMPLANT, BILATERAL  2012   bilateral  . CIRCUMCISION N/A 08/30/2018   Procedure: CIRCUMCISION ADULT;  Surgeon: Kathie Rhodes, MD;  Location: [facility];  Service: Urology;  Laterality: N/A;  . COLONOSCOPY  2013, 2008  . CORONARY ARTERY BYPASS GRAFT  2006   x4  . ESOPHAGOGASTRODUODENOSCOPY  10/2011  . EYE SURGERY    . JOINT REPLACEMENT    . KNEE ARTHROSCOPY  2001  .  [place] SURGERY  1998  . TENDON RECONSTRUCTION Right 10/27/2013   Procedure: RIGHT OPEN TRICEPS REPAIR;  Surgeon: Renette Butters, MD;  Location: [place];  Service: Orthopedics;  Laterality: Right;    Prior to Admission medications   Medication Sig Start Date End Date Taking? Authorizing Provider  acetaminophen (TYLENOL) 500 MG tablet Take 1,000 mg by mouth every morning.   Yes [provider]  aspirin 325 MG tablet Take 325 mg by mouth every evening.    Yes [provider]  Calcium Carbonate Antacid (TUMS ULTRA PO) Take 2 tablets by mouth daily as needed (acid).    Yes [provider]  Coenzyme Q10 (COQ10) 100 MG CAPS Take 100 mg by mouth 2 (two) times daily.   Yes [provider]  ezetimibe (ZETIA) 10 MG tablet Take 0.5 tablets (5 mg total) by mouth every evening. Takes 1/2 tablet daily 12/24/18  Yes [name], MD  finasteride (PROSCAR) 5 MG tablet Take 1 tablet by mouth every morning.  02/26/18  Yes [provider]  fish oil-omega-3 fatty acids 1000 MG capsule Take 1 g by mouth 2 (two) times daily.   Yes [provider]  fluconazole (DIFLUCAN) 200 MG tablet Take 200 mg by mouth once  a week. 05/13/19  Yes [provider]  losartan (COZAAR) 25 MG tablet TAKE 1 TABLET BY MOUTH EVERY EVENING 05/05/19  Yes [name], MD  Methylcobalamin (B12-ACTIVE PO) Take 1 tablet by mouth daily.    Yes [provider]  metoprolol succinate (TOPROL-XL) 25 MG 24 hr tablet Take 1 tablet (25 mg total) by mouth daily after supper. Take with or immediately following a meal. 05/30/19 05/24/20 Yes [name], MD  Multiple Vitamin (MULTIVITAMIN) tablet Take 1 tablet by mouth daily.   Yes [provider]  Multiple Vitamins-Minerals (PRESERVISION AREDS 2) CAPS Take 1 capsule by mouth 2 (two) times daily.   Yes [provider]  nitroGLYCERIN (NITROSTAT) 0.4 MG SL tablet TAKE 1 TABLET EVERY 5 MINUTES AS NEEDED FOR  CHEST PAIN. Patient taking differently: Place 0.4 mg under the tongue every 5 (five) minutes as needed for chest pain.  04/01/19  Yes [name], MD  pantoprazole (PROTONIX) 40 MG tablet Take 1 tablet (40 mg total) by mouth daily. Patient taking differently: Take 40 mg by mouth every morning.  03/17/16  Yes Elgergawy, Silver Huguenin, MD  Polyethyl Glycol-Propyl Glycol (SYSTANE) 0.4-0.3 % GEL ophthalmic gel Place 1 application into both eyes at bedtime.   Yes [provider]  polyethylene glycol (MIRALAX) packet Take 17 g by mouth daily. Patient taking differently: Take 17 g by mouth daily as needed for moderate constipation.  03/17/16  Yes Elgergawy, Silver Huguenin, MD  Propylene Glycol (SYSTANE BALANCE) 0.6 % SOLN Place 1 drop into both eyes daily as needed (dry eyes).   Yes [provider]  pyridoxine (B-6) 100 MG tablet Take 100 mg by mouth daily.   Yes [provider]  ranolazine (RANEXA) 1000 MG SR tablet TAKE 1 TABLET EVERY 12 HOURS Patient taking differently: Take 1,000 mg by mouth 2 (two) times daily.  12/20/18  Yes [name], MD  rosuvastatin (CRESTOR) 20 MG tablet TAKE 1 TABLET BY MOUTH EVERY DAY Patient taking differently: Take 20 mg by mouth daily.  04/26/19  Yes [name], MD  vitamin C (ASCORBIC ACID) 500 MG tablet Take 500 mg by mouth daily.   Yes [provider]   Allergies  Allergen Reactions  . Codeine Nausea And Vomiting  . Guaifenesin & Derivatives Nausea And Vomiting  . Terbinafine And Related Other (See Comments)    Muscle pain  . Penicillins Rash    Tolerates Cefazolin without problems...[employer]    Social History   Tobacco Use  . Smoking status: Never Smoker  . Smokeless tobacco: Never Used  Substance Use Topics  . Alcohol use: No    Family History  Problem Relation Age of Onset  . Colon cancer Mother 39  . Hypertension Mother   . Colon cancer Father 69  . Heart attack Brother   . Heart failure Brother   . Stomach cancer Neg Hx       Review of Systems  Positive ROS: Negative  All other systems have been reviewed and were otherwise negative with the exception of those mentioned in the HPI and as above.  Objective: Vital signs in last 24 hours: Temp:  [97.1 F (36.2 C)] 97.1 F (36.2 C) (05/05 1157) Pulse Rate:  [54] 54 (05/05 1157) Resp:  [18] 18 (05/05 1157) BP: (171)/(65) 171/65 (05/05 1157) SpO2:  [100 %] 100 % (05/05 1157) Weight:  [91.9 kg] 91.9 kg (05/05 1157)  General Appearance: Alert, cooperative, no distress, appears stated age Head: Normocephalic, without obvious abnormality, atraumatic  Eyes: PERRL, conjunctiva/corneas clear, EOM's intact    Neck: Supple, symmetrical, trachea midline Back: Symmetric, no curvature, ROM normal, no CVA tenderness Lungs:  respirations unlabored Heart: Regular rate and rhythm Abdomen: Soft, non-tender Extremities: Extremities normal, atraumatic, no cyanosis or edema Pulses: 2+ and symmetric all extremities Skin: Skin color, texture, turgor normal, no rashes or lesions  NEUROLOGIC:   Mental status: Alert and oriented x4,  no aphasia, good attention span, fund of knowledge, and memory Motor Exam - grossly normal Sensory Exam - grossly normal Reflexes: 1+ Coordination - grossly normal Gait - grossly normal Balance - grossly normal Cranial Nerves: I: smell Not tested  II: visual acuity  OS: nl    OD: nl  II: visual fields Full to confrontation  II: pupils Equal, round, reactive to light  III,VII: ptosis None  III,IV,VI: extraocular muscles  Full ROM  V: mastication Normal  V: facial light touch sensation  Normal  V,VII: corneal reflex  Present  VII: facial muscle function - upper  Normal  VII: facial muscle function - lower Normal  VIII: hearing Not tested  IX: soft palate elevation  Normal  IX,X: gag reflex Present  XI: trapezius strength  5/5  XI: sternocleidomastoid strength 5/5  XI: neck flexion strength  5/5  XII: tongue strength  Normal    Data  Review Lab Results  Component Value Date   WBC 5.8 07/11/2019   HGB 12.7 (L) 07/11/2019   HCT 38.2 (L) 07/11/2019   MCV 100.3 (H) 07/11/2019   PLT 214 07/11/2019   Lab Results  Component Value Date   NA 135 07/11/2019   K 4.5 07/11/2019   CL 102 07/11/2019   CO2 27 07/11/2019   BUN 22 07/11/2019   CREATININE 1.26 (H) 07/11/2019   GLUCOSE 108 (H) 07/11/2019   Lab Results  Component Value Date   INR 1.0 07/11/2019    Assessment/Plan:  Estimated body mass index is 31.73 kg/m as calculated from the following:   Height as of this encounter: 5\' 7"  (1.702 m).   Weight as of this encounter: 91.9 kg. Patient admitted for compressive lumbar laminectomy L2-3 and L3-4 for spinal stenosis causing back and leg pain. Patient has failed a reasonable attempt at conservative therapy.  I explained the condition and procedure to the patient and answered any questions.  Patient wishes to proceed with procedure as planned. Understands risks/ benefits and typical outcomes of procedure.   Eustace Moore 07/13/2019 12:03 PM

## 2019-07-13 NOTE — Op Note (Signed)
07/13/2019  3:45 PM  PATIENT:  [name]  74 y.o. male  PRE-OPERATIVE DIAGNOSIS: Lumbar spinal stenosis L2-3 L3-4 with back and leg pain  POST-OPERATIVE DIAGNOSIS:  same  PROCEDURE: Decompressive lumbar laminectomy, medial facetectomy foraminotomies L2-3 L3-4 bilaterally  SURGEON:  Sherley Bounds, MD  ASSISTANTS: Glenford Peers, FNP  ANESTHESIA:   General  EBL: 50 ml  Total I/O In: 1000 [I.V.:1000] Out: 50 [Blood:50]  BLOOD ADMINISTERED: none  DRAINS: None  SPECIMEN:  none  INDICATION FOR PROCEDURE: This patient presented with back and leg pain. Imaging showed severe stenosis L2-L3 4. The patient tried conservative measures without relief. Pain was debilitating. Recommended decompressive laminectomy L2-3 L3-4. Patient understood the risks, benefits, and alternatives and potential outcomes and wished to proceed.  PROCEDURE DETAILS: The patient was taken to the operating room and after induction of adequate generalized endotracheal anesthesia, the patient was rolled into the prone position on the Wilson frame and all pressure points were padded. The lumbar region was cleaned and then prepped with DuraPrep and draped in the usual sterile fashion. 5 cc of local anesthesia was injected and then a dorsal midline incision was made and carried down to the lumbo sacral fascia. The fascia was opened and the paraspinous musculature was taken down in a subperiosteal fashion to expose L2-3 and L3-4 bilaterally. Intraoperative x-ray confirmed my level, and then I removed the L2 and L3 spinous processes and used a combination of the high-speed drill and the Kerrison punches to perform laminectomy, medial facetectomy, and foraminotomy at L2-3 and L3-4 bilaterally. The underlying yellow ligament was opened and removed in a piecemeal fashion to expose the underlying dura and exiting nerve root. I undercut the lateral recess and dissected down until I was medial to and distal to the pedicle at both  levels. The nerve root was well decompressed.  I then palpated with a coronary dilator along the nerve root and into the foramen at each level to assure adequate decompression. I felt no more compression of the nerve root. I irrigated with saline solution containing bacitracin. Achieved hemostasis with bipolar cautery, lined the dura with Gelfoam, and then closed the fascia with 0 Vicryl. I closed the subcutaneous tissues with 2-0 Vicryl and the subcuticular tissues with 3-0 Vicryl. The skin was then closed with benzoin and Steri-Strips. The drapes were removed, a sterile dressing was applied.  My nurse practitioner was involved in the exposure, safe retraction of the neural elements, the disc work and the closure. the patient was awakened from general anesthesia and transferred to the recovery room in stable condition. At the end of the procedure all sponge, needle and instrument counts were correct.      PLAN OF CARE: Admit for overnight observation  PATIENT DISPOSITION:  PACU - hemodynamically stable.   Delay start of Pharmacological VTE agent (>24hrs) due to surgical blood loss or risk of bleeding:  yes

## 2019-07-13 NOTE — Progress Notes (Signed)
Pharmacy Antibiotic Note  [name] is a 74 y.o. male admitted on 07/13/2019 with lumbar spinal stenosis L2-3 L3-4 with back and leg pain.  Pt is S/P decompressive lumbar laminectomy, medial facetectomy foraminotomies L2-3 L3-4 bilaterally. Pharmacy has been consulted for vancomycin dosing for post op surgical prophylaxis.  Pt rec'd vancomycin 1 gm IV X 1 pre op today at 12:10 PM. Per neurosurgeon note, pt has no drains in place post op. WBC 5.8, afebrile. CrCl 54.7 ml/min  Plan: Vancomycin 1 gm IV X 1 at midnight tonight (~12 hrs after pre op dose)  Height: 5\' 7"  (170.2 cm) Weight: 91.9 kg (202 lb 9.6 oz) IBW/kg (Calculated) : 66.1  Temp (24hrs), Avg:97.7 F (36.5 C), Min:97.1 F (36.2 C), Max:98.2 F (36.8 C)  Recent Labs  Lab 07/11/19 1040  WBC 5.8  CREATININE 1.26*    Estimated Creatinine Clearance: 54.7 mL/min (A) (by C-G formula based on SCr of 1.26 mg/dL (H)).    Allergies  Allergen Reactions  . Codeine Nausea And Vomiting  . Guaifenesin & Derivatives Nausea And Vomiting  . Terbinafine And Related Other (See Comments)    Muscle pain  . Penicillins Rash    Tolerates Cefazolin without problems...[facility]    Microbiology results: 5/3 MRSA PCR: negative 5/3 COVID: negative  Thank you for allowing pharmacy to be a part of this patient's care.  Gillermina Hu, PharmD, BCPS, [facility] Clinical Pharmacist 07/13/2019 5:32 PM

## 2019-07-13 NOTE — Anesthesia Postprocedure Evaluation (Signed)
Anesthesia Post Note  Patient: [name]  Procedure(s) Performed: Laminectomy and Foraminotomy - Lumbar Two-Lumbar Three - Lumbar Three-Lumbar Four (N/A Back)     Patient location during evaluation: PACU Anesthesia Type: General Level of consciousness: awake Pain management: pain level controlled Respiratory status: spontaneous breathing Cardiovascular status: stable Postop Assessment: no apparent nausea or vomiting Anesthetic complications: no    Last Vitals:  Vitals:   07/13/19 1700 07/13/19 1717  BP:  (!) 154/82  Pulse: 62 (!) 59  Resp: 16 20  Temp: 36.8 C 36.5 C  SpO2: 92% 100%    Last Pain:  Vitals:   07/13/19 1730  TempSrc:   PainSc: 6                  Hatice Bubel

## 2019-07-13 NOTE — Anesthesia Procedure Notes (Signed)
Procedure Name: Intubation Date/Time: 07/13/2019 2:20 PM Performed by: Candis Shine, CRNA Pre-anesthesia Checklist: Patient identified, Emergency Drugs available, Suction available and Patient being monitored Patient Re-evaluated:Patient Re-evaluated prior to induction Oxygen Delivery Method: Circle System Utilized Preoxygenation: Pre-oxygenation with 100% oxygen Induction Type: IV induction Ventilation: Mask ventilation without difficulty and Oral airway inserted - appropriate to patient size Laryngoscope Size: Mac and 4 Grade View: Grade II Tube type: Oral Tube size: 8.0 mm Number of attempts: 1 Airway Equipment and Method: Stylet and Oral airway Placement Confirmation: ETT inserted through vocal cords under direct vision,  positive ETCO2 and breath sounds checked- equal and bilateral Secured at: 22 cm Tube secured with: Tape Dental Injury: Teeth and Oropharynx as per pre-operative assessment

## 2019-07-14 ENCOUNTER — Encounter: Payer: Self-pay | Admitting: *Deleted

## 2019-07-14 DIAGNOSIS — M48061 Spinal stenosis, lumbar region without neurogenic claudication: Secondary | ICD-10-CM | POA: Diagnosis not present

## 2019-07-14 MED ORDER — METHOCARBAMOL 500 MG PO TABS: 500.0000 mg | ORAL_TABLET | Freq: Four times a day (QID) | ORAL | 0 refills | Status: DC | PRN

## 2019-07-14 NOTE — Anesthesia Postprocedure Evaluation (Signed)
Anesthesia Post Note  Patient: [name]  Procedure(s) Performed: Laminectomy and Foraminotomy - Lumbar Two-Lumbar Three - Lumbar Three-Lumbar Four (N/A Back)     Patient location during evaluation: PACU Anesthesia Type: General Level of consciousness: awake Pain management: pain level controlled Vital Signs Assessment: post-procedure vital signs reviewed and stable Respiratory status: spontaneous breathing Cardiovascular status: stable Postop Assessment: no apparent nausea or vomiting Anesthetic complications: no    Last Vitals:  Vitals:   07/14/19 0352 07/14/19 0751  BP: 127/60 (!) 134/57  Pulse: (!) 58 62  Resp: 20 19  Temp: 36.9 C 36.7 C  SpO2: 99% 100%    Last Pain:  Vitals:   07/14/19 0751  TempSrc: Oral  PainSc:                  [name]

## 2019-07-14 NOTE — Plan of Care (Signed)
Pt and wife given D/C instructions with verbal understanding. Rx's were sent to the pharmacy by MD. Pt's incision is clean and dry with no sign of infection. Pt's IV was removed prior to D/C. Pt D/C'd home via wheelchair per MD order. Pt is stable @ D/C and has no other needs at this time. Holli Humbles, RN

## 2019-07-14 NOTE — Discharge Summary (Signed)
Physician Discharge Summary  Patient ID: [name] MRN: [id number] DOB/AGE: October 14, 1945 74 y.o.  Admit date: 07/13/2019 Discharge date: 07/14/2019  Admission Diagnoses: Lumbar spinal stenosis L2-3 L3-4 with back and leg pain    Discharge Diagnoses: same   Discharged Condition: good  Hospital Course: The patient was admitted on 07/13/2019 and taken to the operating room where the patient underwent lumbar laminectomy L2-3, L3-4. The patient tolerated the procedure well and was taken to the recovery room and then to the floor in stable condition. The hospital course was routine. There were no complications. The wound remained clean dry and intact. Pt had appropriate back soreness. No complaints of leg pain or new N/T/W. The patient remained afebrile with stable vital signs, and tolerated a regular diet. The patient continued to increase activities, and pain was well controlled with oral pain medications.   Consults: None  Significant Diagnostic Studies:  Results for orders placed or performed during the hospital encounter of 07/11/19  SARS CORONAVIRUS 2 (TAT 6-24 HRS) Nasopharyngeal Nasopharyngeal Swab   Specimen: Nasopharyngeal Swab  Result Value Ref Range   SARS Coronavirus 2 NEGATIVE NEGATIVE    Chest 2 View  Result Date: 07/11/2019 CLINICAL DATA:  Preop, history of hypertension, CAD EXAM: CHEST - 2 VIEW COMPARISON:  03/16/2016 FINDINGS: Prior CABG. Heart and mediastinal contours are within normal limits. No focal opacities or effusions. No acute bony abnormality. IMPRESSION: No active cardiopulmonary disease. Electronically Signed   By: Rolm Baptise M.D.   On: 07/11/2019 15:29   DG Lumbar Spine 1 View  Result Date: 07/13/2019 CLINICAL DATA:  Surgery, elective. EXAM: LUMBAR SPINE - 1 VIEW COMPARISON:  Lumbar spine MRI 03/01/2019 FINDINGS: An intraoperative lateral view radiograph of the lumbar spine is submitted for evaluation. On this image, a site marker and retractors project  posterior to the L2-L3 level. IMPRESSION: Intraoperative lateral view radiograph of the lumbar spine demonstrating site marker posterior to the L2-L3 level. Electronically Signed   By: Kellie Simmering DO   On: 07/13/2019 16:59    Antibiotics:  Anti-infectives (From admission, onward)   Start     Dose/Rate Route Frequency Ordered Stop   07/14/19 0000  vancomycin (VANCOCIN) IVPB 1000 mg/200 mL premix     1,000 mg 200 mL/hr over 60 Minutes Intravenous  Once 07/13/19 1737 07/14/19 0019   07/13/19 1400  bacitracin 50,000 Units in sodium chloride 0.9 % 500 mL irrigation  Status:  Discontinued       As needed 07/13/19 1452 07/13/19 1548   07/13/19 1200  vancomycin (VANCOCIN) IVPB 1000 mg/200 mL premix     1,000 mg 200 mL/hr over 60 Minutes Intravenous On call to O.R. 07/13/19 1143 07/13/19 1310      Discharge Exam: Blood pressure (!) 134/57, pulse 62, temperature 98.1 F (36.7 C), temperature source Oral, resp. rate 19, height 5\' 7"  (1.702 m), weight 91.9 kg, SpO2 100 %. Neurologic: Grossly normal Ambulating and voiding well, incision cdi  Discharge Medications:   Allergies as of 07/14/2019      Reactions   Codeine Nausea And Vomiting   Guaifenesin & Derivatives Nausea And Vomiting   Terbinafine And Related Other (See Comments)   Muscle pain   Penicillins Rash   Tolerates Cefazolin without problems...[employer]      Medication List    TAKE these medications   acetaminophen 500 MG tablet Commonly known as: TYLENOL Take 1,000 mg by mouth every morning.   aspirin 325 MG tablet Take 325 mg by mouth every  evening.   B12-ACTIVE PO Take 1 tablet by mouth daily.   CoQ10 100 MG Caps Take 100 mg by mouth 2 (two) times daily.   ezetimibe 10 MG tablet Commonly known as: Zetia Take 0.5 tablets (5 mg total) by mouth every evening. Takes 1/2 tablet daily   finasteride 5 MG tablet Commonly known as: PROSCAR Take 1 tablet by mouth every morning.   fish oil-omega-3 fatty acids 1000 MG  capsule Take 1 g by mouth 2 (two) times daily.   fluconazole 200 MG tablet Commonly known as: DIFLUCAN Take 200 mg by mouth once a week.   losartan 25 MG tablet Commonly known as: COZAAR TAKE 1 TABLET BY MOUTH EVERY EVENING   methocarbamol 500 MG tablet Commonly known as: ROBAXIN Take 1 tablet (500 mg total) by mouth every 6 (six) hours as needed for muscle spasms.   metoprolol succinate 25 MG 24 hr tablet Commonly known as: TOPROL-XL Take 1 tablet (25 mg total) by mouth daily after supper. Take with or immediately following a meal.   multivitamin tablet Take 1 tablet by mouth daily.   nitroGLYCERIN 0.4 MG SL tablet Commonly known as: NITROSTAT TAKE 1 TABLET EVERY 5 MINUTES AS NEEDED FOR CHEST PAIN. What changed: See the new instructions.   pantoprazole 40 MG tablet Commonly known as: Protonix Take 1 tablet (40 mg total) by mouth daily. What changed: when to take this   polyethylene glycol 17 g packet Commonly known as: MiraLax Take 17 g by mouth daily. What changed:   when to take this  reasons to take this   PreserVision AREDS 2 Caps Take 1 capsule by mouth 2 (two) times daily.   pyridoxine 100 MG tablet Commonly known as: B-6 Take 100 mg by mouth daily.   ranolazine 1000 MG SR tablet Commonly known as: RANEXA TAKE 1 TABLET EVERY 12 HOURS What changed: when to take this   rosuvastatin 20 MG tablet Commonly known as: CRESTOR TAKE 1 TABLET BY MOUTH EVERY DAY   Systane 0.4-0.3 % Gel ophthalmic gel Generic drug: Polyethyl Glycol-Propyl Glycol Place 1 application into both eyes at bedtime.   Systane Balance 0.6 % Soln Generic drug: Propylene Glycol Place 1 drop into both eyes daily as needed (dry eyes).   TUMS ULTRA PO Take 2 tablets by mouth daily as needed (acid).   vitamin C 500 MG tablet Commonly known as: ASCORBIC ACID Take 500 mg by mouth daily.       Disposition: home   Final Dx: lumbar laminectomy L2-3, L3-4  Discharge Instructions      Remove dressing in 72 hours   Complete by: As directed    Call MD for:  difficulty breathing, headache or visual disturbances   Complete by: As directed    Call MD for:  hives   Complete by: As directed    Call MD for:  persistant dizziness or light-headedness   Complete by: As directed    Call MD for:  persistant nausea and vomiting   Complete by: As directed    Call MD for:  redness, tenderness, or signs of infection (pain, swelling, redness, odor or green/yellow discharge around incision site)   Complete by: As directed    Call MD for:  severe uncontrolled pain   Complete by: As directed    Call MD for:  temperature >100.4   Complete by: As directed    Diet - low sodium heart healthy   Complete by: As directed    Driving Restrictions  Complete by: As directed    No driving for 2 weeks, no riding in the car for 1 week   Increase activity slowly   Complete by: As directed    Lifting restrictions   Complete by: As directed    No lifting more than 8 lbs         Signed: Ocie Cornfield Suvi Archuletta 07/14/2019, 8:10 AM

## 2019-07-31 ENCOUNTER — Other Ambulatory Visit: Payer: Self-pay | Admitting: Cardiology

## 2019-08-12 ENCOUNTER — Other Ambulatory Visit: Payer: Self-pay

## 2019-08-12 ENCOUNTER — Ambulatory Visit: Payer: PPO

## 2019-08-12 DIAGNOSIS — I25118 Atherosclerotic heart disease of native coronary artery with other forms of angina pectoris: Secondary | ICD-10-CM | POA: Diagnosis not present

## 2019-08-12 DIAGNOSIS — R0609 Other forms of dyspnea: Secondary | ICD-10-CM | POA: Diagnosis not present

## 2019-08-16 NOTE — Progress Notes (Signed)
Normal LVEF. Mild AS is new.

## 2019-08-23 DIAGNOSIS — M7062 Trochanteric bursitis, left hip: Secondary | ICD-10-CM | POA: Diagnosis not present

## 2019-08-23 DIAGNOSIS — M7061 Trochanteric bursitis, right hip: Secondary | ICD-10-CM | POA: Diagnosis not present

## 2019-08-23 DIAGNOSIS — M48062 Spinal stenosis, lumbar region with neurogenic claudication: Secondary | ICD-10-CM | POA: Diagnosis not present

## 2019-09-19 DIAGNOSIS — N1831 Chronic kidney disease, stage 3a: Secondary | ICD-10-CM | POA: Diagnosis not present

## 2019-09-20 DIAGNOSIS — M7062 Trochanteric bursitis, left hip: Secondary | ICD-10-CM | POA: Diagnosis not present

## 2019-09-20 DIAGNOSIS — Z6830 Body mass index (BMI) 30.0-30.9, adult: Secondary | ICD-10-CM | POA: Diagnosis not present

## 2019-09-20 DIAGNOSIS — M48062 Spinal stenosis, lumbar region with neurogenic claudication: Secondary | ICD-10-CM | POA: Diagnosis not present

## 2019-09-20 DIAGNOSIS — M7061 Trochanteric bursitis, right hip: Secondary | ICD-10-CM | POA: Diagnosis not present

## 2019-09-22 DIAGNOSIS — N451 Epididymitis: Secondary | ICD-10-CM | POA: Diagnosis not present

## 2019-09-24 ENCOUNTER — Other Ambulatory Visit: Payer: Self-pay | Admitting: Cardiology

## 2019-09-24 DIAGNOSIS — I2510 Atherosclerotic heart disease of native coronary artery without angina pectoris: Secondary | ICD-10-CM

## 2019-09-28 DIAGNOSIS — H353131 Nonexudative age-related macular degeneration, bilateral, early dry stage: Secondary | ICD-10-CM | POA: Diagnosis not present

## 2019-09-28 DIAGNOSIS — H0102B Squamous blepharitis left eye, upper and lower eyelids: Secondary | ICD-10-CM | POA: Diagnosis not present

## 2019-09-28 DIAGNOSIS — H0102A Squamous blepharitis right eye, upper and lower eyelids: Secondary | ICD-10-CM | POA: Diagnosis not present

## 2019-09-28 DIAGNOSIS — H04123 Dry eye syndrome of bilateral lacrimal glands: Secondary | ICD-10-CM | POA: Diagnosis not present

## 2019-09-28 DIAGNOSIS — H40023 Open angle with borderline findings, high risk, bilateral: Secondary | ICD-10-CM | POA: Diagnosis not present

## 2019-09-28 DIAGNOSIS — H524 Presbyopia: Secondary | ICD-10-CM | POA: Diagnosis not present

## 2019-09-28 DIAGNOSIS — H11823 Conjunctivochalasis, bilateral: Secondary | ICD-10-CM | POA: Diagnosis not present

## 2019-10-06 DIAGNOSIS — M48062 Spinal stenosis, lumbar region with neurogenic claudication: Secondary | ICD-10-CM | POA: Diagnosis not present

## 2019-10-15 ENCOUNTER — Other Ambulatory Visit: Payer: Self-pay | Admitting: Cardiology

## 2019-10-15 DIAGNOSIS — M791 Myalgia, unspecified site: Secondary | ICD-10-CM | POA: Diagnosis not present

## 2019-10-15 DIAGNOSIS — Z20828 Contact with and (suspected) exposure to other viral communicable diseases: Secondary | ICD-10-CM | POA: Diagnosis not present

## 2019-10-15 DIAGNOSIS — R509 Fever, unspecified: Secondary | ICD-10-CM | POA: Diagnosis not present

## 2019-10-15 DIAGNOSIS — R05 Cough: Secondary | ICD-10-CM | POA: Diagnosis not present

## 2019-10-16 ENCOUNTER — Other Ambulatory Visit (HOSPITAL_COMMUNITY): Payer: Self-pay | Admitting: Oncology

## 2019-10-16 DIAGNOSIS — U071 COVID-19: Secondary | ICD-10-CM

## 2019-10-16 NOTE — Progress Notes (Signed)
I connected by phone with  Jerry Caras on 10/16/2019 at 8 AM to discuss the potential use of an new treatment for mild to moderate COVID-19 viral infection in non-hospitalized patients.   This patient is a age/sex that meets the FDA criteria for Emergency Use Authorization of casirivimab\imdevimab.  Has a (+) direct SARS-CoV-2 viral test result 1. Has mild or moderate COVID-19  2. Is ? 75 years of age and weighs ? 40 kg 3. Is NOT hospitalized due to COVID-19 4. Is NOT requiring oxygen therapy or requiring an increase in baseline oxygen flow rate due to COVID-19 5. Is within 10 days of symptom onset 6. Has at least one of the high risk factor(s) for progression to severe COVID-19 and/or hospitalization as defined in EUA. ? Specific high risk criteria : age   Symptom onset   10/14/2019.   I have spoken and communicated the following to the patient or parent/caregiver:   1. FDA has authorized the emergency use of casirivimab\imdevimab for the treatment of mild to moderate COVID-19 in adults and pediatric patients with positive results of direct SARS-CoV-2 viral testing who are 71 years of age and older weighing at least 40 kg, and who are at high risk for progressing to severe COVID-19 and/or hospitalization.   2. The significant known and potential risks and benefits of casirivimab\imdevimab, and the extent to which such potential risks and benefits are unknown.   3. Information on available alternative treatments and the risks and benefits of those alternatives, including clinical trials.   4. Patients treated with casirivimab\imdevimab should continue to self-isolate and use infection control measures (e.g., wear mask, isolate, social distance, avoid sharing personal items, clean and disinfect "high touch" surfaces, and frequent handwashing) according to CDC guidelines.    5. The patient or parent/caregiver has the option to accept or refuse casirivimab\imdevimab .   After reviewing this  information with the patient, The patient agreed to proceed with receiving casirivimab\imdevimab infusion and will be provided a copy of the Fact sheet prior to receiving the infusion.Rulon Abide, AGNP-C [phone] ([place])

## 2019-10-17 MED ORDER — SODIUM CHLORIDE 0.9 % IV SOLN
1200.0000 mg | Freq: Once | INTRAVENOUS | Status: AC
  Administered 2019-10-18: 1200 mg via INTRAVENOUS
  Filled 2019-10-17: qty 1200

## 2019-10-18 ENCOUNTER — Ambulatory Visit (HOSPITAL_COMMUNITY)
Admission: RE | Admit: 2019-10-18 | Discharge: 2019-10-18 | Disposition: A | Discharge status: Home / Self Care | Payer: Medicare Other | Source: Ambulatory Visit | Attending: Pulmonary Disease | Admitting: Pulmonary Disease

## 2019-10-18 DIAGNOSIS — Z23 Encounter for immunization: Secondary | ICD-10-CM | POA: Insufficient documentation

## 2019-10-18 DIAGNOSIS — U071 COVID-19: Secondary | ICD-10-CM | POA: Diagnosis present

## 2019-10-18 MED ORDER — FAMOTIDINE IN NACL 20-0.9 MG/50ML-% IV SOLN: 20.0000 mg | Freq: Once | INTRAVENOUS | Status: DC | PRN

## 2019-10-18 MED ORDER — EPINEPHRINE 0.3 MG/0.3ML IJ SOAJ: 0.3000 mg | Freq: Once | INTRAMUSCULAR | Status: DC | PRN

## 2019-10-18 MED ORDER — DIPHENHYDRAMINE HCL 50 MG/ML IJ SOLN: 50.0000 mg | Freq: Once | INTRAMUSCULAR | Status: DC | PRN

## 2019-10-18 MED ORDER — ALBUTEROL SULFATE HFA 108 (90 BASE) MCG/ACT IN AERS: 2.0000 | INHALATION_SPRAY | Freq: Once | RESPIRATORY_TRACT | Status: DC | PRN

## 2019-10-18 MED ORDER — SODIUM CHLORIDE 0.9 % IV SOLN: INTRAVENOUS | Status: DC | PRN

## 2019-10-18 MED ORDER — METHYLPREDNISOLONE SODIUM SUCC 125 MG IJ SOLR: 125.0000 mg | Freq: Once | INTRAMUSCULAR | Status: DC | PRN

## 2019-10-18 NOTE — Progress Notes (Signed)
  Diagnosis: COVID-19  Physician: Dr. Patrick Wright  Procedure: [facility] Med: casirivimab\imdevimab infusion - Provided patient with casirivimab\imdevimab fact sheet for patients, parents and caregivers prior to infusion.  Complications: No immediate complications noted.  Discharge: Discharged home   Kashina Mecum S Kadarious Dikes 10/18/2019  

## 2019-10-18 NOTE — Discharge Instructions (Signed)
10 Things You Can Do to Manage Your COVID-19 Symptoms at Home If you have possible or confirmed COVID-19: 1. Stay home from work and school. And stay away from other public places. If you must go out, avoid using any kind of public transportation, ridesharing, or taxis. 2. Monitor your symptoms carefully. If your symptoms get worse, call your healthcare provider immediately. 3. Get rest and stay hydrated. 4. If you have a medical appointment, call the healthcare provider ahead of time and tell them that you have or may have COVID-19. 5. For medical emergencies, call 911 and notify the dispatch personnel that you have or may have COVID-19. 6. Cover your cough and sneezes with a tissue or use the inside of your elbow. 7. Wash your hands often with soap and water for at least 20 seconds or clean your hands with an alcohol-based hand sanitizer that contains at least 60% alcohol. 8. As much as possible, stay in a specific room and away from other people in your home. Also, you should use a separate bathroom, if available. If you need to be around other people in or outside of the home, wear a mask. 9. Avoid sharing personal items with other people in your household, like dishes, towels, and bedding. 10. Clean all surfaces that are touched often, like counters, tabletops, and doorknobs. Use household cleaning sprays or wipes according to the label instructions. [email or web address] 09/08/2018 This information is not intended to replace advice given to you by your health care provider. Make sure you discuss any questions you have with your health care provider. Document Revised: 02/10/2019 Document Reviewed: 02/10/2019 Elsevier Patient Education  [place]. What types of side effects do monoclonal antibody drugs cause?  Common side effects  In general, the more common side effects caused by monoclonal antibody drugs include: . Allergic reactions, such as hives or itching . Flu-like signs and  symptoms, including chills, fatigue, fever, and muscle aches and pains . Nausea, vomiting . Diarrhea . Skin rashes . Low blood pressure   The CDC is recommending patients who receive monoclonal antibody treatments wait at least 90 days before being vaccinated.  Currently, there are no data on the safety and efficacy of mRNA COVID-19 vaccines in persons who received monoclonal antibodies or convalescent plasma as part of COVID-19 treatment. Based on the estimated half-life of such therapies as well as evidence suggesting that reinfection is uncommon in the 90 days after initial infection, vaccination should be deferred for at least 90 days, as a precautionary measure until additional information becomes available, to avoid interference of the antibody treatment with vaccine-induced immune responses. What types of side effects do monoclonal antibody drugs cause?  Common side effects  In general, the more common side effects caused by monoclonal antibody drugs include: . Allergic reactions, such as hives or itching . Flu-like signs and symptoms, including chills, fatigue, fever, and muscle aches and pains . Nausea, vomiting . Diarrhea . Skin rashes . Low blood pressure   The CDC is recommending patients who receive monoclonal antibody treatments wait at least 90 days before being vaccinated.  Currently, there are no data on the safety and efficacy of mRNA COVID-19 vaccines in persons who received monoclonal antibodies or convalescent plasma as part of COVID-19 treatment. Based on the estimated half-life of such therapies as well as evidence suggesting that reinfection is uncommon in the 90 days after initial infection, vaccination should be deferred for at least 90 days, as a precautionary measure until additional information  additional information becomes available, to avoid interference of the antibody treatment with vaccine-induced immune responses. 

## 2019-10-23 ENCOUNTER — Other Ambulatory Visit: Payer: Self-pay | Admitting: Cardiology

## 2019-10-27 ENCOUNTER — Other Ambulatory Visit: Payer: Self-pay | Admitting: Cardiology

## 2019-11-01 DIAGNOSIS — M48062 Spinal stenosis, lumbar region with neurogenic claudication: Secondary | ICD-10-CM | POA: Diagnosis not present

## 2019-11-03 DIAGNOSIS — M48062 Spinal stenosis, lumbar region with neurogenic claudication: Secondary | ICD-10-CM | POA: Diagnosis not present

## 2019-11-07 DIAGNOSIS — N1831 Chronic kidney disease, stage 3a: Secondary | ICD-10-CM | POA: Diagnosis not present

## 2019-11-07 DIAGNOSIS — D692 Other nonthrombocytopenic purpura: Secondary | ICD-10-CM | POA: Diagnosis not present

## 2019-11-07 DIAGNOSIS — N401 Enlarged prostate with lower urinary tract symptoms: Secondary | ICD-10-CM | POA: Diagnosis not present

## 2019-11-07 DIAGNOSIS — N183 Chronic kidney disease, stage 3 unspecified: Secondary | ICD-10-CM | POA: Diagnosis not present

## 2019-11-07 DIAGNOSIS — I272 Pulmonary hypertension, unspecified: Secondary | ICD-10-CM | POA: Diagnosis not present

## 2019-11-07 DIAGNOSIS — Z6830 Body mass index (BMI) 30.0-30.9, adult: Secondary | ICD-10-CM | POA: Diagnosis not present

## 2019-11-07 DIAGNOSIS — R7302 Impaired glucose tolerance (oral): Secondary | ICD-10-CM | POA: Diagnosis not present

## 2019-11-07 DIAGNOSIS — I209 Angina pectoris, unspecified: Secondary | ICD-10-CM | POA: Diagnosis not present

## 2019-11-07 DIAGNOSIS — I1310 Hypertensive heart and chronic kidney disease without heart failure, with stage 1 through stage 4 chronic kidney disease, or unspecified chronic kidney disease: Secondary | ICD-10-CM | POA: Diagnosis not present

## 2019-11-07 DIAGNOSIS — Z8616 Personal history of COVID-19: Secondary | ICD-10-CM | POA: Diagnosis not present

## 2019-11-07 DIAGNOSIS — E7800 Pure hypercholesterolemia, unspecified: Secondary | ICD-10-CM | POA: Diagnosis not present

## 2019-11-08 DIAGNOSIS — B351 Tinea unguium: Secondary | ICD-10-CM | POA: Diagnosis not present

## 2019-11-08 DIAGNOSIS — M48062 Spinal stenosis, lumbar region with neurogenic claudication: Secondary | ICD-10-CM | POA: Diagnosis not present

## 2019-11-10 DIAGNOSIS — M48062 Spinal stenosis, lumbar region with neurogenic claudication: Secondary | ICD-10-CM | POA: Diagnosis not present

## 2019-11-15 DIAGNOSIS — M48062 Spinal stenosis, lumbar region with neurogenic claudication: Secondary | ICD-10-CM | POA: Diagnosis not present

## 2019-11-21 DIAGNOSIS — M48062 Spinal stenosis, lumbar region with neurogenic claudication: Secondary | ICD-10-CM | POA: Diagnosis not present

## 2019-11-23 DIAGNOSIS — M48062 Spinal stenosis, lumbar region with neurogenic claudication: Secondary | ICD-10-CM | POA: Diagnosis not present

## 2019-11-30 DIAGNOSIS — M48062 Spinal stenosis, lumbar region with neurogenic claudication: Secondary | ICD-10-CM | POA: Diagnosis not present

## 2019-12-01 ENCOUNTER — Telehealth: Payer: Self-pay

## 2019-12-01 DIAGNOSIS — R42 Dizziness and giddiness: Secondary | ICD-10-CM

## 2019-12-01 DIAGNOSIS — I10 Essential (primary) hypertension: Secondary | ICD-10-CM

## 2019-12-01 DIAGNOSIS — I25118 Atherosclerotic heart disease of native coronary artery with other forms of angina pectoris: Secondary | ICD-10-CM

## 2019-12-01 NOTE — Telephone Encounter (Signed)
Patient called me about having dizzy spells especially when he suddenly stands up.  He still continues to work in the maintenance.  His blood pressure has been soft with systolic blood pressure around 110 mmHg.  He has not had any chest pain, no dyspnea, no leg edema.  Advised him to decrease metoprolol succinate from 25 mg daily to 12.5 mg daily along with reducing the dose of losartan from 25 mg to 12.5 mg daily as well.  If symptoms persist to contact me.    ICD-10-CM   1. Dizziness on standing  R42   2. Coronary artery disease of native artery of native heart with stable angina pectoris ([place])  I25.118   3. Essential hypertension  I10     No orders of the defined types were placed in this encounter.   Medications Discontinued During This Encounter  Medication Reason  . metoprolol succinate (TOPROL-XL) 25 MG 24 hr tablet   . losartan (COZAAR) 25 MG tablet     Changed to 1/2 tab daily on both.    [name], MD, [facility] 12/01/2019, 5:01 PM Office: [phone]

## 2019-12-02 DIAGNOSIS — M48062 Spinal stenosis, lumbar region with neurogenic claudication: Secondary | ICD-10-CM | POA: Diagnosis not present

## 2019-12-13 DIAGNOSIS — M48062 Spinal stenosis, lumbar region with neurogenic claudication: Secondary | ICD-10-CM | POA: Diagnosis not present

## 2019-12-13 DIAGNOSIS — M7061 Trochanteric bursitis, right hip: Secondary | ICD-10-CM | POA: Diagnosis not present

## 2019-12-28 DIAGNOSIS — M5126 Other intervertebral disc displacement, lumbar region: Secondary | ICD-10-CM | POA: Diagnosis not present

## 2019-12-28 DIAGNOSIS — M47816 Spondylosis without myelopathy or radiculopathy, lumbar region: Secondary | ICD-10-CM | POA: Diagnosis not present

## 2019-12-28 DIAGNOSIS — M1612 Unilateral primary osteoarthritis, left hip: Secondary | ICD-10-CM | POA: Diagnosis not present

## 2019-12-28 DIAGNOSIS — M48062 Spinal stenosis, lumbar region with neurogenic claudication: Secondary | ICD-10-CM | POA: Diagnosis not present

## 2019-12-29 DIAGNOSIS — M48062 Spinal stenosis, lumbar region with neurogenic claudication: Secondary | ICD-10-CM | POA: Diagnosis not present

## 2020-01-20 DIAGNOSIS — Z6830 Body mass index (BMI) 30.0-30.9, adult: Secondary | ICD-10-CM | POA: Diagnosis not present

## 2020-01-20 DIAGNOSIS — M48062 Spinal stenosis, lumbar region with neurogenic claudication: Secondary | ICD-10-CM | POA: Diagnosis not present

## 2020-02-01 ENCOUNTER — Other Ambulatory Visit: Payer: Self-pay | Admitting: Cardiology

## 2020-02-01 DIAGNOSIS — I2510 Atherosclerotic heart disease of native coronary artery without angina pectoris: Secondary | ICD-10-CM

## 2020-02-14 DIAGNOSIS — M7061 Trochanteric bursitis, right hip: Secondary | ICD-10-CM | POA: Diagnosis not present

## 2020-02-14 DIAGNOSIS — Z6830 Body mass index (BMI) 30.0-30.9, adult: Secondary | ICD-10-CM | POA: Diagnosis not present

## 2020-02-14 DIAGNOSIS — I10 Essential (primary) hypertension: Secondary | ICD-10-CM | POA: Diagnosis not present

## 2020-02-27 DIAGNOSIS — M25512 Pain in left shoulder: Secondary | ICD-10-CM | POA: Diagnosis not present

## 2020-03-08 DIAGNOSIS — B351 Tinea unguium: Secondary | ICD-10-CM | POA: Diagnosis not present

## 2020-03-26 DIAGNOSIS — M25512 Pain in left shoulder: Secondary | ICD-10-CM | POA: Diagnosis not present

## 2020-03-29 DIAGNOSIS — M25512 Pain in left shoulder: Secondary | ICD-10-CM | POA: Diagnosis not present

## 2020-04-06 DIAGNOSIS — S46012A Strain of muscle(s) and tendon(s) of the rotator cuff of left shoulder, initial encounter: Secondary | ICD-10-CM | POA: Diagnosis not present

## 2020-04-06 DIAGNOSIS — H40023 Open angle with borderline findings, high risk, bilateral: Secondary | ICD-10-CM | POA: Diagnosis not present

## 2020-04-06 DIAGNOSIS — H0102B Squamous blepharitis left eye, upper and lower eyelids: Secondary | ICD-10-CM | POA: Diagnosis not present

## 2020-04-06 DIAGNOSIS — H0102A Squamous blepharitis right eye, upper and lower eyelids: Secondary | ICD-10-CM | POA: Diagnosis not present

## 2020-04-06 DIAGNOSIS — H353131 Nonexudative age-related macular degeneration, bilateral, early dry stage: Secondary | ICD-10-CM | POA: Diagnosis not present

## 2020-04-06 DIAGNOSIS — M25512 Pain in left shoulder: Secondary | ICD-10-CM | POA: Diagnosis not present

## 2020-04-06 DIAGNOSIS — H04123 Dry eye syndrome of bilateral lacrimal glands: Secondary | ICD-10-CM | POA: Diagnosis not present

## 2020-04-09 DIAGNOSIS — S46012A Strain of muscle(s) and tendon(s) of the rotator cuff of left shoulder, initial encounter: Secondary | ICD-10-CM | POA: Diagnosis not present

## 2020-04-09 DIAGNOSIS — M25512 Pain in left shoulder: Secondary | ICD-10-CM | POA: Diagnosis not present

## 2020-04-16 ENCOUNTER — Other Ambulatory Visit: Payer: Self-pay | Admitting: Cardiology

## 2020-04-16 DIAGNOSIS — E782 Mixed hyperlipidemia: Secondary | ICD-10-CM

## 2020-04-18 ENCOUNTER — Encounter: Payer: Self-pay | Admitting: Cardiology

## 2020-04-27 DIAGNOSIS — E291 Testicular hypofunction: Secondary | ICD-10-CM | POA: Diagnosis not present

## 2020-04-27 DIAGNOSIS — Z125 Encounter for screening for malignant neoplasm of prostate: Secondary | ICD-10-CM | POA: Diagnosis not present

## 2020-04-27 DIAGNOSIS — R7302 Impaired glucose tolerance (oral): Secondary | ICD-10-CM | POA: Diagnosis not present

## 2020-04-27 DIAGNOSIS — E7800 Pure hypercholesterolemia, unspecified: Secondary | ICD-10-CM | POA: Diagnosis not present

## 2020-05-01 DIAGNOSIS — I10 Essential (primary) hypertension: Secondary | ICD-10-CM | POA: Diagnosis not present

## 2020-05-01 DIAGNOSIS — Z1212 Encounter for screening for malignant neoplasm of rectum: Secondary | ICD-10-CM | POA: Diagnosis not present

## 2020-05-01 DIAGNOSIS — R82998 Other abnormal findings in urine: Secondary | ICD-10-CM | POA: Diagnosis not present

## 2020-05-04 DIAGNOSIS — E663 Overweight: Secondary | ICD-10-CM | POA: Diagnosis not present

## 2020-05-04 DIAGNOSIS — E7800 Pure hypercholesterolemia, unspecified: Secondary | ICD-10-CM | POA: Diagnosis not present

## 2020-05-04 DIAGNOSIS — Z9861 Coronary angioplasty status: Secondary | ICD-10-CM | POA: Diagnosis not present

## 2020-05-04 DIAGNOSIS — I209 Angina pectoris, unspecified: Secondary | ICD-10-CM | POA: Diagnosis not present

## 2020-05-04 DIAGNOSIS — S46012D Strain of muscle(s) and tendon(s) of the rotator cuff of left shoulder, subsequent encounter: Secondary | ICD-10-CM | POA: Diagnosis not present

## 2020-05-04 DIAGNOSIS — R7302 Impaired glucose tolerance (oral): Secondary | ICD-10-CM | POA: Diagnosis not present

## 2020-05-04 DIAGNOSIS — D692 Other nonthrombocytopenic purpura: Secondary | ICD-10-CM | POA: Diagnosis not present

## 2020-05-04 DIAGNOSIS — I1310 Hypertensive heart and chronic kidney disease without heart failure, with stage 1 through stage 4 chronic kidney disease, or unspecified chronic kidney disease: Secondary | ICD-10-CM | POA: Diagnosis not present

## 2020-05-04 DIAGNOSIS — N1831 Chronic kidney disease, stage 3a: Secondary | ICD-10-CM | POA: Diagnosis not present

## 2020-05-04 DIAGNOSIS — I272 Pulmonary hypertension, unspecified: Secondary | ICD-10-CM | POA: Diagnosis not present

## 2020-05-04 DIAGNOSIS — N401 Enlarged prostate with lower urinary tract symptoms: Secondary | ICD-10-CM | POA: Diagnosis not present

## 2020-05-04 DIAGNOSIS — Z0000 Encounter for general adult medical examination without abnormal findings: Secondary | ICD-10-CM | POA: Diagnosis not present

## 2020-05-21 ENCOUNTER — Encounter: Payer: Self-pay | Admitting: Cardiology

## 2020-05-21 ENCOUNTER — Other Ambulatory Visit: Payer: Self-pay

## 2020-05-21 ENCOUNTER — Ambulatory Visit: Payer: PPO | Admitting: Cardiology

## 2020-05-21 VITALS — BP 136/75 | HR 61 | Temp 97.3°F | Resp 17 | Ht 67.0 in | Wt 184.2 lb

## 2020-05-21 DIAGNOSIS — E782 Mixed hyperlipidemia: Secondary | ICD-10-CM | POA: Diagnosis not present

## 2020-05-21 DIAGNOSIS — I350 Nonrheumatic aortic (valve) stenosis: Secondary | ICD-10-CM | POA: Diagnosis not present

## 2020-05-21 DIAGNOSIS — I10 Essential (primary) hypertension: Secondary | ICD-10-CM

## 2020-05-21 DIAGNOSIS — I25118 Atherosclerotic heart disease of native coronary artery with other forms of angina pectoris: Secondary | ICD-10-CM

## 2020-05-21 DIAGNOSIS — R55 Syncope and collapse: Secondary | ICD-10-CM

## 2020-05-21 DIAGNOSIS — Z951 Presence of aortocoronary bypass graft: Secondary | ICD-10-CM

## 2020-05-21 NOTE — Progress Notes (Signed)
Primary Physician/Referring:  Haywood Pao, MD  Patient ID: MERWIN BREDEN, male    DOB: 1945/05/30, 75 y.o.   MRN: [id number]  Chief Complaint  Patient presents with  . Coronary Artery Disease    1 YEAR  . Hyperlipidemia   HPI:    TILMON WISEHART  is a 75 y.o. [place] male patient with known history of coronary artery disease and chronic stable angina,  hypertension, mixed hyperlipidemia, GERD, chronic back pain, DJD. He has stable angina. Patient is very active, works as a Therapist, sports and also Garment/textile technologist and does heavy work without any significant discomfort.   He has had occasional episodes of sudden onset dizziness especially when he sits down and gets up or he bends down which he had complained to me last year.  However the symptoms have gotten worse and symptoms are occurring even when he is already standing up and he suddenly feels dizzy and feels like he is going to pass out, in fact he has had a fall and has torn his left biceps and also rotator cuff and is scheduled for surgery on 05/24/2020 by Dr. Mardelle Matte.  Is presently wearing a cast in his left arm.  Accompanied by his wife.  No change in chronic back pain.  Denies chest pain, has mild chronic dyspnea.  Past Medical History:  Diagnosis Date  . Anginal pain ([place])   . Arthritis   . Basal cell carcinoma    back of neck  . Blood transfusion without reported diagnosis 2006   during open heart surgery  . CAD (coronary artery disease)   . Diverticulosis 2013   Noted on Colonoscopy  . Dyspnea    with activity and walking, sometime has SOB walking to mailbox and back to the house  . GERD (gastroesophageal reflux disease)   . History of colon polyps 2008   Noted on Colonoscopy  . History of esophageal stricture 2003   Noted on EGD  . Hyperlipidemia   . Hypertension   . IVCD (intraventricular conduction defect) 05/2018   Noted on EKG  . 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 Left  axis deviation 05/2018   Noted on EKG  . 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 LVH (left ventricular hypertrophy) 05/2018   Noted on EKG  . Phimosis   . Pneumonia   . Tuberculosis 1956  . Wears glasses   . Wheezing    Past Surgical History:  Procedure Laterality Date  . ANGIOPLASTY  2010   3 stents placed  . BACK SURGERY    . CATARACT EXTRACTION W/ INTRAOCULAR LENS  IMPLANT, BILATERAL  2012   bilateral  . CIRCUMCISION N/A 08/30/2018   Procedure: CIRCUMCISION ADULT;  Surgeon: Kathie Rhodes, MD;  Location: [facility];  Service: Urology;  Laterality: N/A;  . COLONOSCOPY  2013, 2008  . CORONARY ARTERY BYPASS GRAFT  2006   x4  . ESOPHAGOGASTRODUODENOSCOPY  10/2011  . EYE SURGERY    . JOINT REPLACEMENT    . KNEE ARTHROSCOPY  2001  . [place] SURGERY  1998  . LUMBAR LAMINECTOMY/DECOMPRESSION MICRODISCECTOMY N/A 07/13/2019   Procedure: Laminectomy and Foraminotomy - Lumbar Two-Lumbar Three - Lumbar Three-Lumbar Four;  Surgeon: Eustace Moore, MD;  Location: [place];  Service: Neurosurgery;  Laterality: N/A;  Laminectomy and Foraminotomy - Lumbar Two-Lumbar Three - Lumbar Three-Lumbar Four  . TENDON RECONSTRUCTION Right 10/27/2013  Procedure: RIGHT OPEN TRICEPS REPAIR;  Surgeon: Renette Butters, MD;  Location: [place];  Service: Orthopedics;  Laterality: Right;   Family History  Problem Relation Age of Onset  . Colon cancer Mother 31  . Hypertension Mother   . Colon cancer Father 58  . Heart attack Brother   . Heart failure Brother   . Stomach cancer Neg Hx     Social History   Tobacco Use  . Smoking status: Never Smoker  . Smokeless tobacco: Never Used  Substance Use Topics  . Alcohol use: No   ROS  Review of Systems  Cardiovascular: Positive for dyspnea on exertion (mild). Negative for chest pain and leg swelling.  Musculoskeletal: Positive for back pain, falls and joint pain (left shoulder).   Gastrointestinal: Negative for melena.  Neurological: Positive for dizziness.   Objective  Blood pressure 136/75, pulse 61, temperature (!) 97.3 F (36.3 C), temperature source Temporal, resp. rate 17, height 5' 7"  (1.702 m), weight 184 lb 3.2 oz (83.6 kg), SpO2 98 %.  Vitals with BMI 05/21/2020 10/18/2019 10/18/2019  Height 5' 7"  - -  Weight 184 lbs 3 oz - -  BMI 97.67 - -  Systolic 341 937 902  Diastolic 75 62 68  Pulse 61 59 64    Orthostatic VS for the past 72 hrs (Last 3 readings):  Orthostatic BP Patient Position BP Location Cuff Size Orthostatic Pulse  05/21/20 0932 127/69 Standing Left Arm Normal 60  05/21/20 0931 147/84 Sitting Left Arm Normal 61  05/21/20 0929 136/80 Supine Left Arm Normal 60     Physical Exam Cardiovascular:     Rate and Rhythm: Normal rate and regular rhythm.     Pulses: Intact distal pulses.     Heart sounds: Murmur heard.   Harsh midsystolic murmur is present with a grade of 2/6 at the upper right sternal border radiating to the neck. No gallop.      Comments: No leg edema, no JVD. Pulmonary:     Effort: Pulmonary effort is normal.     Breath sounds: Normal breath sounds.  Abdominal:     General: Bowel sounds are normal.     Palpations: Abdomen is soft.    Laboratory examination:   Recent Labs    07/11/19 1040  NA 135  K 4.5  CL 102  CO2 27  GLUCOSE 108*  BUN 22  CREATININE 1.26*  CALCIUM 9.4  GFRNONAA 55*  GFRAA >60   CrCl cannot be calculated (Patient's most recent lab result is older than the maximum 21 days allowed.).  CMP Latest Ref Rng & Units 07/11/2019 08/30/2018 03/17/2016  Glucose 70 - 99 mg/dL 108(H) 112(H) 117(H)  BUN 8 - 23 mg/dL 22 - 15  Creatinine 0.61 - 1.24 mg/dL 1.26(H) - 1.13  Sodium 135 - 145 mmol/L 135 137 133(L)  Potassium 3.5 - 5.1 mmol/L 4.5 4.7 3.8  Chloride 98 - 111 mmol/L 102 - 103  CO2 22 - 32 mmol/L 27 - 25  Calcium 8.9 - 10.3 mg/dL 9.4 - 8.2(L)  Total Protein 6.5 - 8.1 g/dL - - 6.0(L)  Total  Bilirubin 0.3 - 1.2 mg/dL - - 1.4(H)  Alkaline Phos 38 - 126 U/L - - 40  AST 15 - 41 U/L - - 22  ALT 17 - 63 U/L - - 17   CBC Latest Ref Rng & Units 07/11/2019 08/30/2018 03/16/2016  WBC 4.0 - 10.5 K/uL 5.8 - 7.3  Hemoglobin 13.0 - 17.0 g/dL  12.7(L) 13.9 15.6  Hematocrit 39.0 - 52.0 % 38.2(L) 41.0 44.0  Platelets 150 - 400 K/uL 214 - 181   External labs:   Labs 05/01/2020:  Serum glucose 102 mg, BUN 21, creatinine 1.0, EGFR 72 mL, potassium 4.4.  CMP normal.  WBC 6.89, Hb 14.0/HCT 40.7, platelets 214.  Normal indicis.  Total cholesterol 102, triglycerides 105, HDL 39, LDL 52.  Non-HDL cholesterol 83.  Testosterone levels normal.  PSA normal.  Cholesterol, total 120.000 m 04/12/2019 HDL 42 MG/DL 04/12/2019 LDL 36.000 mg 04/12/2019 Triglycerides 209.000 04/12/2019  A1C 6.100 % 04/12/2019  Hemoglobin 13.400 g/ 04/12/2019 Creatinine, Serum 1.200 mg/ 04/12/2019  Medications and allergies   Allergies  Allergen Reactions  . Codeine Nausea And Vomiting  . Guaifenesin & Derivatives Nausea And Vomiting  . Terbinafine And Related Other (See Comments)    Muscle pain  . Penicillins Rash    Tolerates Cefazolin without problems...[place]    Outpatient Medications Prior to Visit  Medication Sig  . acetaminophen (TYLENOL) 500 MG tablet Take 1,000 mg by mouth every morning.  . Calcium Carbonate Antacid (TUMS ULTRA PO) Take 2 tablets by mouth daily as needed (acid).   . Coenzyme Q10 (COQ10) 100 MG CAPS Take 100 mg by mouth 2 (two) times daily.  Marland Kitchen ezetimibe (ZETIA) 10 MG tablet TAKE 0.5 TABLETS (5 MG TOTAL) BY MOUTH EVERY EVENING. TAKES 1/2 TABLET DAILY  . finasteride (PROSCAR) 5 MG tablet Take 1 tablet by mouth every morning.   . fish oil-omega-3 fatty acids 1000 MG capsule Take 1 g by mouth 2 (two) times daily.  . fluconazole (DIFLUCAN) 200 MG tablet Take 200 mg by mouth once a week.  . losartan (COZAAR) 25 MG tablet Take 0.5 tablets (12.5 mg total) by mouth every evening.  . methocarbamol (ROBAXIN) 500  MG tablet Take 1 tablet (500 mg total) by mouth every 6 (six) hours as needed for muscle spasms.  . Methylcobalamin (B12-ACTIVE PO) Take 1 tablet by mouth daily.   . metoprolol succinate (TOPROL-XL) 25 MG 24 hr tablet Take 0.5 tablets (12.5 mg total) by mouth daily after supper. Take with or immediately following a meal.  . Multiple Vitamin (MULTIVITAMIN) tablet Take 1 tablet by mouth daily.  . Multiple Vitamins-Minerals (PRESERVISION AREDS 2) CAPS Take 1 capsule by mouth 2 (two) times daily.  . nitroGLYCERIN (NITROSTAT) 0.4 MG SL tablet TAKE 1 TABLET EVERY 5 MINUTES AS NEEDED FOR CHEST PAIN.  Marland Kitchen pantoprazole (PROTONIX) 40 MG tablet Take 1 tablet (40 mg total) by mouth daily. (Patient taking differently: Take 40 mg by mouth every morning.)  . Polyethyl Glycol-Propyl Glycol (SYSTANE) 0.4-0.3 % GEL ophthalmic gel Place 1 application into both eyes at bedtime.  . polyethylene glycol (MIRALAX) packet Take 17 g by mouth daily. (Patient taking differently: Take 17 g by mouth daily as needed for moderate constipation.)  . Propylene Glycol (SYSTANE BALANCE) 0.6 % SOLN Place 1 drop into both eyes daily as needed (dry eyes).  . pyridoxine (B-6) 100 MG tablet Take 100 mg by mouth daily.  . ranolazine (RANEXA) 1000 MG SR tablet TAKE 1 TABLET BY MOUTH EVERY 12 HOURS  . rosuvastatin (CRESTOR) 20 MG tablet TAKE 1 TABLET BY MOUTH EVERY DAY  . vitamin C (ASCORBIC ACID) 500 MG tablet Take 500 mg by mouth daily.  Marland Kitchen aspirin 325 MG tablet Take 325 mg by mouth every evening.    No facility-administered medications prior to visit.    Radiology:   No results found.  Cardiac Studies:  Cardiac cath 06/2008: LIMA to LAD, Skip SVG to OM1 + D1 with ostial 4x15 non DES, has residual stenosis in the OM 1 limb of the skip graft. SVG to PD, Xience  Stent RCA 3x18, 3.5x15. CABG 1996.  Treadmill stress test 08/10/2015: 1. Resting EKG, normal sinus rhythm, incomplete right bundle branch block.  Stress EKG was nondiagnostic  for ischemia due to inability to achieve target heart rate 73% MPHR).  Patient exercised on Bruce protocol for 8 minutes and achieved 10.16 mets.  There was no ST-T changes at peak exercise. Stress terminated due to fatigue.  Stress converted to [facility] pharmacologic stress.  There was no additional ST-T wave changes with from plastic stress protocol. Stress symptoms included dyspnea. 2. The perfusion imaging study demonstrates a very small sized very mild ischemia in the inferior wall.  Left ventricular systolic function calculated QGS was normal at 70%. This is a low risk study.  Carotid artery duplex 08/17/2014: No hemodynamically significant arterial disease in the internal carotid artery bilaterally. Mild soft plaque noted.  Echocardiogram 08/12/2019: Left ventricle cavity is normal in size. Mild concentric hypertrophy of the left ventricle. Normal global wall motion. Normal LV systolic function with EF 55%. Doppler evidence of grade I (impaired) diastolic dysfunction, normal LAP.  Left atrial cavity is mildly dilated. 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 Probably bicuspid aortic valve.  Mild aortic stenosis. Aortic valve mean gradient of 10 mmHg, Vmax of 2.0  m/s. Calculated aortic valve area by continuity equation is 1.6 cm.  Mild to moderate tricuspid regurgitation. Estimated pulmonary artery systolic pressure is 32 mmHg.  Compared to previous study in 2018, mild AS is new.    EKG    EKG 05/21/2020: Normal sinus rhythm at rate of 58 bpm, left atrial enlargement, left axis deviation, left anterior fascicular block.  IVCD, borderline criteria for LVH.  No evidence of ischemia, normal QT interval.  No change from 05/28/2019.  Assessment     ICD-10-CM   1. Coronary artery disease of native artery of native heart with stable angina pectoris ([place])  I25.118 PCV ECHOCARDIOGRAM COMPLETE    PCV MYOCARDIAL PERFUSION WITH LEXISCAN  2. Hx of CABG   Z95.1   3. Primary hypertension  I10 EKG 12-Lead  4. Mixed hyperlipidemia  E78.2   5. Near syncope  R55 LONG TERM MONITOR (3-14 DAYS)    PCV ECHOCARDIOGRAM COMPLETE    PCV MYOCARDIAL PERFUSION WITH LEXISCAN  6. Mild aortic stenosis  I35.0     No orders of the defined types were placed in this encounter. There are no discontinued medications. Orders Placed This Encounter  Procedures  . LONG TERM MONITOR (3-14 DAYS)    Standing Status:   Future    Standing Expiration Date:   05/21/2021    Order Specific Question:   Where should this test be performed?    Answer:   [facility]    Order Specific Question:   Does the patient have an implanted cardiac device?    Answer:   No    Order Specific Question:   Prescribed days of wear    Answer:   14  . PCV MYOCARDIAL PERFUSION WITH LEXISCAN    Standing Status:   Future    Standing Expiration Date:   07/21/2020  . EKG 12-Lead  . PCV ECHOCARDIOGRAM COMPLETE    Standing Status:   Future    Standing Expiration Date:   05/21/2021    Recommendations:   Francee Piccolo  D Kniskern  is a 75 y.o. [place] male patient with known history of coronary artery disease and chronic stable angina,  hypertension, mixed hyperlipidemia, GERD, chronic back pain, DJD. He has stable angina and mild aortic stenosis  He has had occasional episodes of sudden onset dizziness especially when he sits down and gets up or he bends down which he had complained to me last year.  However the symptoms have gotten worse and symptoms are occurring even when he is already standing up and he suddenly feels dizzy and feels like he is going to pass out, in fact he has had a fall and has torn his left biceps and also rotator cuff and is scheduled for surgery on 05/24/2020 by Dr. Mardelle Matte.  Although he has not had any chest pain, sudden onset episodes of near syncope is concerning for either heart block or his continued dyspnea that is chronic may also indicate progression of coronary artery  disease.  He has been >5 years since he has had cardiac evaluation.  I will obtain outpatient extended EKG monitoring, and echocardiogram to follow-up on aortic stenosis and also reevaluate LV systolic function and perform a Lexiscan Myoview stress test.  With regard to upcoming surgery, it is a very low risk surgery and he can be taken up for surgery with low risk.  I would like to see him back in 1 month.  He is not orthostatic today.   [name], MD, [facility] 05/21/2020, 9:39 AM Office: [phone] Pager: [phone]

## 2020-05-24 ENCOUNTER — Inpatient Hospital Stay: Payer: PPO

## 2020-05-24 DIAGNOSIS — G8918 Other acute postprocedural pain: Secondary | ICD-10-CM | POA: Diagnosis not present

## 2020-05-24 DIAGNOSIS — S46012A Strain of muscle(s) and tendon(s) of the rotator cuff of left shoulder, initial encounter: Secondary | ICD-10-CM | POA: Diagnosis not present

## 2020-05-24 DIAGNOSIS — M24112 Other articular cartilage disorders, left shoulder: Secondary | ICD-10-CM | POA: Diagnosis not present

## 2020-05-24 DIAGNOSIS — M7542 Impingement syndrome of left shoulder: Secondary | ICD-10-CM | POA: Diagnosis not present

## 2020-05-24 DIAGNOSIS — R55 Syncope and collapse: Secondary | ICD-10-CM

## 2020-05-24 DIAGNOSIS — S46812A Strain of other muscles, fascia and tendons at shoulder and upper arm level, left arm, initial encounter: Secondary | ICD-10-CM | POA: Diagnosis not present

## 2020-05-24 DIAGNOSIS — X58XXXA Exposure to other specified factors, initial encounter: Secondary | ICD-10-CM | POA: Diagnosis not present

## 2020-05-24 DIAGNOSIS — M75122 Complete rotator cuff tear or rupture of left shoulder, not specified as traumatic: Secondary | ICD-10-CM | POA: Diagnosis not present

## 2020-05-24 DIAGNOSIS — S43432A Superior glenoid labrum lesion of left shoulder, initial encounter: Secondary | ICD-10-CM | POA: Diagnosis not present

## 2020-05-24 DIAGNOSIS — M7522 Bicipital tendinitis, left shoulder: Secondary | ICD-10-CM | POA: Diagnosis not present

## 2020-05-24 DIAGNOSIS — Y999 Unspecified external cause status: Secondary | ICD-10-CM | POA: Diagnosis not present

## 2020-05-24 HISTORY — PX: ROTATOR CUFF REPAIR: SHX139

## 2020-05-30 ENCOUNTER — Other Ambulatory Visit: Payer: Self-pay | Admitting: Cardiology

## 2020-05-30 DIAGNOSIS — I25118 Atherosclerotic heart disease of native coronary artery with other forms of angina pectoris: Secondary | ICD-10-CM

## 2020-06-06 DIAGNOSIS — S46012D Strain of muscle(s) and tendon(s) of the rotator cuff of left shoulder, subsequent encounter: Secondary | ICD-10-CM | POA: Diagnosis not present

## 2020-06-07 DIAGNOSIS — R7302 Impaired glucose tolerance (oral): Secondary | ICD-10-CM | POA: Diagnosis not present

## 2020-06-07 DIAGNOSIS — N1831 Chronic kidney disease, stage 3a: Secondary | ICD-10-CM | POA: Diagnosis not present

## 2020-06-07 DIAGNOSIS — B351 Tinea unguium: Secondary | ICD-10-CM | POA: Diagnosis not present

## 2020-06-07 DIAGNOSIS — Z9861 Coronary angioplasty status: Secondary | ICD-10-CM | POA: Diagnosis not present

## 2020-06-07 DIAGNOSIS — I1310 Hypertensive heart and chronic kidney disease without heart failure, with stage 1 through stage 4 chronic kidney disease, or unspecified chronic kidney disease: Secondary | ICD-10-CM | POA: Diagnosis not present

## 2020-06-07 DIAGNOSIS — N401 Enlarged prostate with lower urinary tract symptoms: Secondary | ICD-10-CM | POA: Diagnosis not present

## 2020-06-07 DIAGNOSIS — I272 Pulmonary hypertension, unspecified: Secondary | ICD-10-CM | POA: Diagnosis not present

## 2020-06-07 DIAGNOSIS — E7800 Pure hypercholesterolemia, unspecified: Secondary | ICD-10-CM | POA: Diagnosis not present

## 2020-06-07 DIAGNOSIS — K219 Gastro-esophageal reflux disease without esophagitis: Secondary | ICD-10-CM | POA: Diagnosis not present

## 2020-06-12 ENCOUNTER — Other Ambulatory Visit: Payer: Self-pay

## 2020-06-12 DIAGNOSIS — E782 Mixed hyperlipidemia: Secondary | ICD-10-CM

## 2020-06-12 DIAGNOSIS — I25118 Atherosclerotic heart disease of native coronary artery with other forms of angina pectoris: Secondary | ICD-10-CM

## 2020-06-12 MED ORDER — METOPROLOL SUCCINATE ER 25 MG PO TB24: ORAL_TABLET | ORAL | 3 refills | Status: DC

## 2020-06-12 MED ORDER — FINASTERIDE 5 MG PO TABS: 5.0000 mg | ORAL_TABLET | ORAL | 3 refills | Status: DC

## 2020-06-12 MED ORDER — ROSUVASTATIN CALCIUM 20 MG PO TABS: 20.0000 mg | ORAL_TABLET | Freq: Every day | ORAL | 3 refills | Status: DC

## 2020-06-12 MED ORDER — EZETIMIBE 10 MG PO TABS: 5.0000 mg | ORAL_TABLET | Freq: Every evening | ORAL | 11 refills | Status: DC

## 2020-06-12 MED ORDER — RANOLAZINE ER 1000 MG PO TB12: 1000.0000 mg | ORAL_TABLET | Freq: Two times a day (BID) | ORAL | 3 refills | Status: DC

## 2020-06-12 MED ORDER — LOSARTAN POTASSIUM 25 MG PO TABS: 12.5000 mg | ORAL_TABLET | Freq: Every evening | ORAL | 3 refills | Status: DC

## 2020-06-19 ENCOUNTER — Other Ambulatory Visit: Payer: Self-pay

## 2020-07-04 DIAGNOSIS — S46012D Strain of muscle(s) and tendon(s) of the rotator cuff of left shoulder, subsequent encounter: Secondary | ICD-10-CM | POA: Diagnosis not present

## 2020-07-07 DIAGNOSIS — I1310 Hypertensive heart and chronic kidney disease without heart failure, with stage 1 through stage 4 chronic kidney disease, or unspecified chronic kidney disease: Secondary | ICD-10-CM | POA: Diagnosis not present

## 2020-07-07 DIAGNOSIS — K219 Gastro-esophageal reflux disease without esophagitis: Secondary | ICD-10-CM | POA: Diagnosis not present

## 2020-07-07 DIAGNOSIS — E7800 Pure hypercholesterolemia, unspecified: Secondary | ICD-10-CM | POA: Diagnosis not present

## 2020-07-07 DIAGNOSIS — N1831 Chronic kidney disease, stage 3a: Secondary | ICD-10-CM | POA: Diagnosis not present

## 2020-07-10 DIAGNOSIS — R55 Syncope and collapse: Secondary | ICD-10-CM | POA: Diagnosis not present

## 2020-07-11 DIAGNOSIS — M25612 Stiffness of left shoulder, not elsewhere classified: Secondary | ICD-10-CM | POA: Diagnosis not present

## 2020-07-11 DIAGNOSIS — M25512 Pain in left shoulder: Secondary | ICD-10-CM | POA: Diagnosis not present

## 2020-07-11 DIAGNOSIS — M6281 Muscle weakness (generalized): Secondary | ICD-10-CM | POA: Diagnosis not present

## 2020-07-12 DIAGNOSIS — M6281 Muscle weakness (generalized): Secondary | ICD-10-CM | POA: Diagnosis not present

## 2020-07-12 DIAGNOSIS — M25612 Stiffness of left shoulder, not elsewhere classified: Secondary | ICD-10-CM | POA: Diagnosis not present

## 2020-07-12 DIAGNOSIS — M25512 Pain in left shoulder: Secondary | ICD-10-CM | POA: Diagnosis not present

## 2020-07-16 DIAGNOSIS — M25512 Pain in left shoulder: Secondary | ICD-10-CM | POA: Diagnosis not present

## 2020-07-16 DIAGNOSIS — M6281 Muscle weakness (generalized): Secondary | ICD-10-CM | POA: Diagnosis not present

## 2020-07-16 DIAGNOSIS — M25612 Stiffness of left shoulder, not elsewhere classified: Secondary | ICD-10-CM | POA: Diagnosis not present

## 2020-07-19 ENCOUNTER — Other Ambulatory Visit: Payer: PPO

## 2020-07-20 DIAGNOSIS — M25512 Pain in left shoulder: Secondary | ICD-10-CM | POA: Diagnosis not present

## 2020-07-20 DIAGNOSIS — M6281 Muscle weakness (generalized): Secondary | ICD-10-CM | POA: Diagnosis not present

## 2020-07-20 DIAGNOSIS — M25612 Stiffness of left shoulder, not elsewhere classified: Secondary | ICD-10-CM | POA: Diagnosis not present

## 2020-07-24 DIAGNOSIS — M25612 Stiffness of left shoulder, not elsewhere classified: Secondary | ICD-10-CM | POA: Diagnosis not present

## 2020-07-24 DIAGNOSIS — M25511 Pain in right shoulder: Secondary | ICD-10-CM | POA: Diagnosis not present

## 2020-07-24 DIAGNOSIS — M6281 Muscle weakness (generalized): Secondary | ICD-10-CM | POA: Diagnosis not present

## 2020-07-27 DIAGNOSIS — M6281 Muscle weakness (generalized): Secondary | ICD-10-CM | POA: Diagnosis not present

## 2020-07-27 DIAGNOSIS — M25612 Stiffness of left shoulder, not elsewhere classified: Secondary | ICD-10-CM | POA: Diagnosis not present

## 2020-07-27 DIAGNOSIS — M25512 Pain in left shoulder: Secondary | ICD-10-CM | POA: Diagnosis not present

## 2020-07-28 ENCOUNTER — Other Ambulatory Visit: Payer: Self-pay | Admitting: Cardiology

## 2020-07-28 DIAGNOSIS — I2510 Atherosclerotic heart disease of native coronary artery without angina pectoris: Secondary | ICD-10-CM

## 2020-07-29 DIAGNOSIS — R55 Syncope and collapse: Secondary | ICD-10-CM | POA: Diagnosis not present

## 2020-07-29 NOTE — Progress Notes (Signed)
Zio Patch Extended out patient EKG monitoring 14days starting 06/19/2020: Predominant rhythm is normal sinus rhythm.  Minimum heart rate 48, maximum heart rate 168 bpm with average heart rate of 62 bpm. There were 4 atrial tachycardia episodes, longest lasted 15 beats with average heart rate of 140 bpm. There are rare PACs, PVCs, ventricular trigeminy. There was no atrial fibrillation, no high degree AV block. No symptoms reported.

## 2020-07-31 ENCOUNTER — Other Ambulatory Visit: Payer: Self-pay

## 2020-07-31 ENCOUNTER — Other Ambulatory Visit: Payer: PPO

## 2020-07-31 ENCOUNTER — Ambulatory Visit: Payer: PPO

## 2020-07-31 DIAGNOSIS — I25118 Atherosclerotic heart disease of native coronary artery with other forms of angina pectoris: Secondary | ICD-10-CM | POA: Diagnosis not present

## 2020-07-31 DIAGNOSIS — R55 Syncope and collapse: Secondary | ICD-10-CM | POA: Diagnosis not present

## 2020-08-01 DIAGNOSIS — M5432 Sciatica, left side: Secondary | ICD-10-CM | POA: Diagnosis not present

## 2020-08-02 DIAGNOSIS — M25512 Pain in left shoulder: Secondary | ICD-10-CM | POA: Diagnosis not present

## 2020-08-02 DIAGNOSIS — M6281 Muscle weakness (generalized): Secondary | ICD-10-CM | POA: Diagnosis not present

## 2020-08-02 DIAGNOSIS — M25612 Stiffness of left shoulder, not elsewhere classified: Secondary | ICD-10-CM | POA: Diagnosis not present

## 2020-08-03 DIAGNOSIS — M25512 Pain in left shoulder: Secondary | ICD-10-CM | POA: Diagnosis not present

## 2020-08-03 DIAGNOSIS — M25612 Stiffness of left shoulder, not elsewhere classified: Secondary | ICD-10-CM | POA: Diagnosis not present

## 2020-08-03 DIAGNOSIS — M6281 Muscle weakness (generalized): Secondary | ICD-10-CM | POA: Diagnosis not present

## 2020-08-04 NOTE — Progress Notes (Signed)
Echocardiogram 07/31/2020: Left ventricle cavity is normal in size and wall thickness. Normal global wall motion. Normal LV systolic function with EF 58%. Doppler evidence of grade I (impaired) diastolic dysfunction, normal LAP.  Aortic valve leaflets number difficult to ascertain. Probably has calcified immobile left coronary cusp.  Mild aortic valve stenosis. Vmax 2.0 m/sec, mean PG 9 mmHg, AVA 1.8 cm2 by continuity equation. Trace tricuspid regurgitation. Estimated pulmonary artery systolic pressure 22 mmHg. No significant change compared to previous study on 08/12/2019.

## 2020-08-07 DIAGNOSIS — M25512 Pain in left shoulder: Secondary | ICD-10-CM | POA: Diagnosis not present

## 2020-08-07 DIAGNOSIS — M25612 Stiffness of left shoulder, not elsewhere classified: Secondary | ICD-10-CM | POA: Diagnosis not present

## 2020-08-07 DIAGNOSIS — M6281 Muscle weakness (generalized): Secondary | ICD-10-CM | POA: Diagnosis not present

## 2020-08-09 DIAGNOSIS — M25612 Stiffness of left shoulder, not elsewhere classified: Secondary | ICD-10-CM | POA: Diagnosis not present

## 2020-08-09 DIAGNOSIS — M25512 Pain in left shoulder: Secondary | ICD-10-CM | POA: Diagnosis not present

## 2020-08-09 DIAGNOSIS — M6281 Muscle weakness (generalized): Secondary | ICD-10-CM | POA: Diagnosis not present

## 2020-08-13 DIAGNOSIS — N5201 Erectile dysfunction due to arterial insufficiency: Secondary | ICD-10-CM | POA: Diagnosis not present

## 2020-08-13 DIAGNOSIS — R351 Nocturia: Secondary | ICD-10-CM | POA: Diagnosis not present

## 2020-08-13 DIAGNOSIS — N401 Enlarged prostate with lower urinary tract symptoms: Secondary | ICD-10-CM | POA: Diagnosis not present

## 2020-08-14 DIAGNOSIS — M25612 Stiffness of left shoulder, not elsewhere classified: Secondary | ICD-10-CM | POA: Diagnosis not present

## 2020-08-14 DIAGNOSIS — M6281 Muscle weakness (generalized): Secondary | ICD-10-CM | POA: Diagnosis not present

## 2020-08-14 DIAGNOSIS — M25512 Pain in left shoulder: Secondary | ICD-10-CM | POA: Diagnosis not present

## 2020-08-17 DIAGNOSIS — M25512 Pain in left shoulder: Secondary | ICD-10-CM | POA: Diagnosis not present

## 2020-08-17 DIAGNOSIS — M6281 Muscle weakness (generalized): Secondary | ICD-10-CM | POA: Diagnosis not present

## 2020-08-17 DIAGNOSIS — M25612 Stiffness of left shoulder, not elsewhere classified: Secondary | ICD-10-CM | POA: Diagnosis not present

## 2020-08-21 DIAGNOSIS — M25612 Stiffness of left shoulder, not elsewhere classified: Secondary | ICD-10-CM | POA: Diagnosis not present

## 2020-08-21 DIAGNOSIS — M25512 Pain in left shoulder: Secondary | ICD-10-CM | POA: Diagnosis not present

## 2020-08-21 DIAGNOSIS — M6281 Muscle weakness (generalized): Secondary | ICD-10-CM | POA: Diagnosis not present

## 2020-08-23 ENCOUNTER — Other Ambulatory Visit: Payer: Self-pay

## 2020-08-23 DIAGNOSIS — I25118 Atherosclerotic heart disease of native coronary artery with other forms of angina pectoris: Secondary | ICD-10-CM

## 2020-08-23 MED ORDER — METOPROLOL SUCCINATE ER 25 MG PO TB24: 12.5000 mg | ORAL_TABLET | Freq: Every day | ORAL | 3 refills | Status: DC

## 2020-08-24 DIAGNOSIS — M25612 Stiffness of left shoulder, not elsewhere classified: Secondary | ICD-10-CM | POA: Diagnosis not present

## 2020-08-24 DIAGNOSIS — M25512 Pain in left shoulder: Secondary | ICD-10-CM | POA: Diagnosis not present

## 2020-08-24 DIAGNOSIS — M6281 Muscle weakness (generalized): Secondary | ICD-10-CM | POA: Diagnosis not present

## 2020-08-28 DIAGNOSIS — M6281 Muscle weakness (generalized): Secondary | ICD-10-CM | POA: Diagnosis not present

## 2020-08-28 DIAGNOSIS — M25512 Pain in left shoulder: Secondary | ICD-10-CM | POA: Diagnosis not present

## 2020-08-28 DIAGNOSIS — M25612 Stiffness of left shoulder, not elsewhere classified: Secondary | ICD-10-CM | POA: Diagnosis not present

## 2020-09-03 ENCOUNTER — Other Ambulatory Visit: Payer: Self-pay

## 2020-09-03 ENCOUNTER — Ambulatory Visit: Payer: PPO | Admitting: Cardiology

## 2020-09-03 ENCOUNTER — Encounter: Payer: Self-pay | Admitting: Cardiology

## 2020-09-03 VITALS — BP 136/68 | HR 58 | Temp 98.0°F | Resp 17 | Ht 67.0 in | Wt 188.6 lb

## 2020-09-03 DIAGNOSIS — I25118 Atherosclerotic heart disease of native coronary artery with other forms of angina pectoris: Secondary | ICD-10-CM | POA: Diagnosis not present

## 2020-09-03 DIAGNOSIS — I10 Essential (primary) hypertension: Secondary | ICD-10-CM

## 2020-09-03 DIAGNOSIS — R42 Dizziness and giddiness: Secondary | ICD-10-CM

## 2020-09-03 DIAGNOSIS — M25512 Pain in left shoulder: Secondary | ICD-10-CM | POA: Diagnosis not present

## 2020-09-03 DIAGNOSIS — R55 Syncope and collapse: Secondary | ICD-10-CM

## 2020-09-03 DIAGNOSIS — M6281 Muscle weakness (generalized): Secondary | ICD-10-CM | POA: Diagnosis not present

## 2020-09-03 DIAGNOSIS — M25612 Stiffness of left shoulder, not elsewhere classified: Secondary | ICD-10-CM | POA: Diagnosis not present

## 2020-09-03 NOTE — Progress Notes (Signed)
Primary Physician/Referring:  Haywood Pao, MD  Patient ID: MARCH STEYER, male    DOB: 03/15/1945, 75 y.o.   MRN: [id number]  Chief Complaint  Patient presents with   Loss of Consciousness   Follow-up   Coronary Artery Disease   HPI:    DELPHIN FUNES  is a 75 y.o.  Caucasian male patient with coronary artery disease and chronic stable angina, mild aortic stenosis, hypertension, mixed hyperlipidemia, GERD, chronic back pain, DJD.  He has had occasional episodes of sudden onset dizziness especially when he sits down and gets up or he bends down which he had complained to me last year.  Due to dizziness and a fall underwent rotator cuff repair on 05/24/2020.  He still has dizziness but has not had any syncope.  He is accompanied by his wife. Denies chest pain, has mild chronic dyspnea.  Past Medical History:  Diagnosis Date   Anginal pain ([place])    Arthritis    Basal cell carcinoma    back of neck   Blood transfusion without reported diagnosis 2006   during open heart surgery   CAD (coronary artery disease)    Diverticulosis 2013   Noted on Colonoscopy   Dyspnea    with activity and walking, sometime has SOB walking to mailbox and back to the house   GERD (gastroesophageal reflux disease)    History of colon polyps 2008   Noted on Colonoscopy   History of esophageal stricture 2003   Noted on EGD   Hyperlipidemia    Hypertension    IVCD (intraventricular conduction defect) 05/2018   Noted on EKG   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   Noted on EKG   Left axis deviation 05/2018   Noted on EKG   Lipoma 04/20/2018   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   LVH (left ventricular hypertrophy) 05/2018   Noted on EKG   Phimosis    Pneumonia    Tuberculosis 1956   Wears glasses    Wheezing    Past Surgical History:  Procedure Laterality Date   ANGIOPLASTY  2010   3 stents placed   BACK SURGERY     CATARACT EXTRACTION W/ [place],  BILATERAL  2012   bilateral   CIRCUMCISION N/A 08/30/2018   Procedure: CIRCUMCISION ADULT;  Surgeon: Kathie Rhodes, MD;  Location: [facility];  Service: Urology;  Laterality: N/A;   COLONOSCOPY  2013, 2008   CORONARY ARTERY BYPASS GRAFT  2006   x4   ESOPHAGOGASTRODUODENOSCOPY  10/2011   EYE SURGERY     JOINT REPLACEMENT     KNEE ARTHROSCOPY  2001   LUMBAR DISC SURGERY  1998   LUMBAR LAMINECTOMY/DECOMPRESSION MICRODISCECTOMY N/A 07/13/2019   Procedure: Laminectomy and Foraminotomy - Lumbar Two-Lumbar Three - Lumbar Three-Lumbar Four;  Surgeon: Eustace Moore, MD;  Location: [facility];  Service: Neurosurgery;  Laterality: N/A;  Laminectomy and Foraminotomy - Lumbar Two-Lumbar Three - Lumbar Three-Lumbar Four   ROTATOR CUFF REPAIR Left 05/24/2020   TENDON RECONSTRUCTION Right 10/27/2013   Procedure: RIGHT OPEN TRICEPS REPAIR;  Surgeon: Renette Butters, MD;  Location: [facility];  Service: Orthopedics;  Laterality: Right;   Family History  Problem Relation Age of Onset   Colon cancer Mother 66   Hypertension Mother    Colon cancer Father 72   Heart attack Brother    Heart failure Brother    Stomach cancer Neg Hx  Social History   Tobacco Use   Smoking status: Never   Smokeless tobacco: Never  Substance Use Topics   Alcohol use: No   ROS  Review of Systems  Cardiovascular:  Positive for dyspnea on exertion (mild). Negative for chest pain and leg swelling.  Musculoskeletal:  Positive for back pain. Negative for falls and joint pain.  Gastrointestinal:  Positive for constipation. Negative for melena.  Neurological:  Positive for dizziness.  Objective  Blood pressure 136/68, pulse (!) 58, temperature 98 F (36.7 C), temperature source Temporal, resp. rate 17, height _0  (1.702 m), weight 188 lb 9.6 oz (85.5 kg), SpO2 99 %.  Vitals with BMI 09/03/2020 05/21/2020 10/18/2019  Height _1  _2  -  Weight 188 lbs 10 oz 184 lbs 3 oz -  BMI 02.72 53.66 -   Systolic 440 347 425  Diastolic 68 75 62  Pulse 58 61 59    Orthostatic VS for the past 72 hrs (Last 3 readings):  Orthostatic BP Patient Position BP Location Cuff Size Orthostatic Pulse  09/03/20 1423 141/64 Standing Right Arm Normal 60  09/03/20 1422 146/61 Sitting Right Arm Normal 61  09/03/20 1421 142/62 Supine Right Arm Normal 59     Physical Exam Neck:     Vascular: No carotid bruit or JVD.  Cardiovascular:     Rate and Rhythm: Normal rate and regular rhythm.     Pulses: Intact distal pulses.     Heart sounds: Murmur heard.  Harsh midsystolic murmur is present with a grade of 2/6 at the upper right sternal border radiating to the neck.    No gallop.  Pulmonary:     Effort: Pulmonary effort is normal.     Breath sounds: Normal breath sounds.  Abdominal:     General: Bowel sounds are normal.     Palpations: Abdomen is soft.  Musculoskeletal:     Right lower leg: No edema.     Left lower leg: No edema.  Skin:    Capillary Refill: Capillary refill takes less than 2 seconds.   Laboratory examination:   No results for input(s): NA, K, CL, CO2, GLUCOSE, BUN, CREATININE, CALCIUM, GFRNONAA, GFRAA in the last 8760 hours.  CrCl cannot be calculated (Patient's most recent lab result is older than the maximum 21 days allowed.).  CMP Latest Ref Rng & Units 07/11/2019 08/30/2018 03/17/2016  Glucose 70 - 99 mg/dL 108(H) 112(H) 117(H)  BUN 8 - 23 mg/dL 22 - 15  Creatinine 0.61 - 1.24 mg/dL 1.26(H) - 1.13  Sodium 135 - 145 mmol/L 135 137 133(L)  Potassium 3.5 - 5.1 mmol/L 4.5 4.7 3.8  Chloride 98 - 111 mmol/L 102 - 103  CO2 22 - 32 mmol/L 27 - 25  Calcium 8.9 - 10.3 mg/dL 9.4 - 8.2(L)  Total Protein 6.5 - 8.1 g/dL - - 6.0(L)  Total Bilirubin 0.3 - 1.2 mg/dL - - 1.4(H)  Alkaline Phos 38 - 126 U/L - - 40  AST 15 - 41 U/L - - 22  ALT 17 - 63 U/L - - 17   CBC Latest Ref Rng & Units 07/11/2019 08/30/2018 03/16/2016  WBC 4.0 - 10.5 K/uL 5.8 - 7.3  Hemoglobin 13.0 - 17.0 g/dL 12.7(L) 13.9  15.6  Hematocrit 39.0 - 52.0 % 38.2(L) 41.0 44.0  Platelets 150 - 400 K/uL 214 - 181   External labs:   Labs 05/01/2020:  Serum glucose 102 mg, BUN 21, creatinine 1.0, EGFR 72 mL, potassium 4.4.  CMP normal.  WBC 6.89, Hb 14.0/HCT 40.7, platelets 214.  Normal indicis.  Total cholesterol 102, triglycerides 105, HDL 39, LDL 52.  Non-HDL cholesterol 83.  Testosterone levels normal.  PSA normal.  Cholesterol, total 120.000 m 04/12/2019 HDL 42 MG/DL 04/12/2019 LDL 36.000 mg 04/12/2019 Triglycerides 209.000 04/12/2019  A1C 6.100 % 04/12/2019  Hemoglobin 13.400 g/ 04/12/2019 Creatinine, Serum 1.200 mg/ 04/12/2019  Medications and allergies   Allergies  Allergen Reactions   Codeine Nausea And Vomiting   Guaifenesin & Derivatives Nausea And Vomiting   Terbinafine And Related Other (See Comments)    Muscle pain   Penicillins Rash    Tolerates Cefazolin without problems...[place]verse    Outpatient Medications Prior to Visit  Medication Sig Note   acetaminophen (TYLENOL) 500 MG tablet Take 1,000 mg by mouth every morning.    aspirin 325 MG tablet Take 325 mg by mouth every evening.     Calcium Carbonate Antacid (TUMS ULTRA PO) Take 2 tablets by mouth daily as needed (acid).     Coenzyme Q10 (COQ10) 100 MG CAPS Take 100 mg by mouth 2 (two) times daily.    ezetimibe (ZETIA) 10 MG tablet Take 0.5 tablets (5 mg total) by mouth every evening. Takes 1/2 tablet daily    fish oil-omega-3 fatty acids 1000 MG capsule Take 1 g by mouth 2 (two) times daily.    fluconazole (DIFLUCAN) 200 MG tablet Take 200 mg by mouth once a week.    ketorolac (TORADOL) 30 MG/ML injection Inject 30 mg into the muscle once.    losartan (COZAAR) 25 MG tablet Take 0.5 tablets (12.5 mg total) by mouth every evening.    Methylcobalamin (B12-ACTIVE PO) Take 1 tablet by mouth daily.     metoprolol succinate (TOPROL-XL) 25 MG 24 hr tablet Take 0.5 tablets (12.5 mg total) by mouth daily. TAKE 1 TABLET (25 MG TOTAL) BY MOUTH DAILY AFTER  SUPPER. TAKE WITH OR IMMEDIATELY FOLLOWING A MEAL.    Multiple Vitamin (MULTIVITAMIN) tablet Take 1 tablet by mouth daily.    Multiple Vitamins-Minerals (PRESERVISION AREDS 2) CAPS Take 1 capsule by mouth 2 (two) times daily.    nitroGLYCERIN (NITROSTAT) 0.4 MG SL tablet TAKE 1 TABLET EVERY 5 MINUTES AS NEEDED FOR CHEST PAIN.    pantoprazole (PROTONIX) 40 MG tablet Take 1 tablet (40 mg total) by mouth daily. (Patient taking differently: Take 40 mg by mouth every morning.)    Polyethyl Glycol-Propyl Glycol (SYSTANE) 0.4-0.3 % GEL ophthalmic gel Place 1 application into both eyes at bedtime.    polyethylene glycol (MIRALAX) packet Take 17 g by mouth daily. (Patient taking differently: Take 17 g by mouth daily as needed for moderate constipation.)    Propylene Glycol (SYSTANE BALANCE) 0.6 % SOLN Place 1 drop into both eyes daily as needed (dry eyes).    pyridoxine (B-6) 100 MG tablet Take 100 mg by mouth daily.    rosuvastatin (CRESTOR) 20 MG tablet Take 1 tablet (20 mg total) by mouth daily.    tamsulosin (FLOMAX) 0.4 MG CAPS capsule Take 0.4 mg by mouth.    vitamin C (ASCORBIC ACID) 500 MG tablet Take 500 mg by mouth daily.    [DISCONTINUED] ranolazine (RANEXA) 1000 MG SR tablet Take 1 tablet (1,000 mg total) by mouth every 12 (twelve) hours. 09/03/2020: To see if dizziness and constipation   [DISCONTINUED] finasteride (PROSCAR) 5 MG tablet Take 1 tablet (5 mg total) by mouth every morning.    [DISCONTINUED] methocarbamol (ROBAXIN) 500 MG tablet Take 1 tablet (500 mg total)  by mouth every 6 (six) hours as needed for muscle spasms.    No facility-administered medications prior to visit.    Radiology:   No results found.  Cardiac Studies:    Cardiac cath 06/2008: LIMA to LAD, Skip SVG to OM1 + D1 with ostial 4x15 non DES, has residual stenosis in the OM 1 limb of the skip graft. SVG to PD, Xience  Stent RCA 3x18, 3.5x15. CABG 1996.  Treadmill stress test 08/10/2015: 1. Resting EKG, normal sinus  rhythm, incomplete right bundle branch block.  Stress EKG was nondiagnostic for ischemia due to inability to achieve target heart rate 73% MPHR).  Patient exercised on Bruce protocol for 8 minutes and achieved 10.16 mets.  There was no ST-T changes at peak exercise. Stress terminated due to fatigue.  Stress converted to [facility] pharmacologic stress.  There was no additional ST-T wave changes with from plastic stress protocol. Stress symptoms included dyspnea. 2. The perfusion imaging study demonstrates a very small sized very mild ischemia in the inferior wall.  Left ventricular systolic function calculated QGS was normal at 70%. This is a low risk study.  Carotid artery duplex 08/17/2014: No hemodynamically significant arterial disease in the internal carotid artery bilaterally. Mild soft plaque noted.   Zio Patch Extended out patient EKG monitoring 14days starting 06/19/2020: Predominant rhythm is normal sinus rhythm.  Minimum heart rate 48, maximumheart rate 168 bpm with average heart rate of 62 bpm. There were 4 atrial tachycardia episodes, longest lasted 15 beats with averageheart rate of 140 bpm. There are rare PACs, PVCs, ventricular trigeminy. There was no atrial fibrillation, no high degree AV block. No symptoms reported.  Echocardiogram 07/31/2020: Left ventricle cavity is normal in size and wall thickness. Normal global wall motion. Normal LV systolic function with EF 58%. Doppler evidence of grade I(impaired) diastolic dysfunction, normal LAP.  Aortic valve leaflets number difficult to ascertain. Probably has calcifiedimmobile left coronary cusp.  Mild aortic valve stenosis. Vmax 2.0 m/sec, mean PG 9 mmHg, AVA 1.8 cm2 bycontinuity equation. Trace tricuspid regurgitation. Estimated pulmonary artery systolic pressure [id number]. No significant change compared to previous study on 08/12/2019.   EKG   EKG 05/21/2020: Normal sinus rhythm at rate of 58 bpm, left atrial enlargement, left axis  deviation, left anterior fascicular block.  IVCD, borderline criteria for LVH.  No evidence of ischemia, normal QT interval.  No change from 05/28/2019.  Assessment     ICD-10-CM   1. Dizziness on standing  R42     2. Near syncope  R55     3. Coronary artery disease of native artery of native heart with stable angina pectoris ([place])  I25.118     4. Primary hypertension  I10       No orders of the defined types were placed in this encounter. Medications Discontinued During This Encounter  Medication Reason   finasteride (PROSCAR) 5 MG tablet Error   methocarbamol (ROBAXIN) 500 MG tablet Error   ranolazine (RANEXA) 1000 MG SR tablet Discontinued by provider   No orders of the defined types were placed in this encounter.   Recommendations:   MATHAN DARROCH  is a 75 y.o. Caucasian male patient with coronary artery disease and chronic stable angina, mild aortic stenosis, hypertension, mixed hyperlipidemia, GERD, chronic back pain, DJD.  He has had occasional episodes of sudden onset dizziness especially when he sits down and gets up or he bends down which he had complained to me last year.  Due to dizziness and  a fall underwent rotator cuff repair on 05/24/2020.  He still has dizziness but has not had any syncope.  He is accompanied by his wife.  I suspect his dizziness could be related to Ranexa and as his angina has remained stable, we will try to discontinue this, hopefully this will also improve his constipation.  He has also been prescribed Flomax but took it for a week and stopped it due to dizziness.  Advised him that if discontinuing Ranexa makes his dizziness better then he could try Flomax again and hold losartan for a week or 2 until he gets used to it.  Unless he has worsening anginal symptoms with discontinuing Ranexa, I will see him back in 6 months for follow-up.  Lipids are well controlled, renal function has remained stable.  Aortic stenosis is mild and stable.    He is not  orthostatic today.   [name], MD, [facility] 09/03/2020, 5:59 PM Office: [phone] Pager: [phone]

## 2020-09-05 DIAGNOSIS — M25612 Stiffness of left shoulder, not elsewhere classified: Secondary | ICD-10-CM | POA: Diagnosis not present

## 2020-09-05 DIAGNOSIS — M25512 Pain in left shoulder: Secondary | ICD-10-CM | POA: Diagnosis not present

## 2020-09-05 DIAGNOSIS — M6281 Muscle weakness (generalized): Secondary | ICD-10-CM | POA: Diagnosis not present

## 2020-09-06 DIAGNOSIS — L570 Actinic keratosis: Secondary | ICD-10-CM | POA: Diagnosis not present

## 2020-09-06 DIAGNOSIS — K219 Gastro-esophageal reflux disease without esophagitis: Secondary | ICD-10-CM | POA: Diagnosis not present

## 2020-09-06 DIAGNOSIS — I1310 Hypertensive heart and chronic kidney disease without heart failure, with stage 1 through stage 4 chronic kidney disease, or unspecified chronic kidney disease: Secondary | ICD-10-CM | POA: Diagnosis not present

## 2020-09-06 DIAGNOSIS — B351 Tinea unguium: Secondary | ICD-10-CM | POA: Diagnosis not present

## 2020-09-06 DIAGNOSIS — E7800 Pure hypercholesterolemia, unspecified: Secondary | ICD-10-CM | POA: Diagnosis not present

## 2020-09-06 DIAGNOSIS — N1831 Chronic kidney disease, stage 3a: Secondary | ICD-10-CM | POA: Diagnosis not present

## 2020-09-06 DIAGNOSIS — L565 Disseminated superficial actinic porokeratosis (DSAP): Secondary | ICD-10-CM | POA: Diagnosis not present

## 2020-09-11 DIAGNOSIS — M25512 Pain in left shoulder: Secondary | ICD-10-CM | POA: Diagnosis not present

## 2020-09-11 DIAGNOSIS — M25612 Stiffness of left shoulder, not elsewhere classified: Secondary | ICD-10-CM | POA: Diagnosis not present

## 2020-09-11 DIAGNOSIS — M6281 Muscle weakness (generalized): Secondary | ICD-10-CM | POA: Diagnosis not present

## 2020-09-12 DIAGNOSIS — M25512 Pain in left shoulder: Secondary | ICD-10-CM | POA: Diagnosis not present

## 2020-09-18 DIAGNOSIS — H40023 Open angle with borderline findings, high risk, bilateral: Secondary | ICD-10-CM | POA: Diagnosis not present

## 2020-09-18 DIAGNOSIS — H0102A Squamous blepharitis right eye, upper and lower eyelids: Secondary | ICD-10-CM | POA: Diagnosis not present

## 2020-09-18 DIAGNOSIS — H04123 Dry eye syndrome of bilateral lacrimal glands: Secondary | ICD-10-CM | POA: Diagnosis not present

## 2020-09-18 DIAGNOSIS — H0102B Squamous blepharitis left eye, upper and lower eyelids: Secondary | ICD-10-CM | POA: Diagnosis not present

## 2020-09-18 DIAGNOSIS — H11823 Conjunctivochalasis, bilateral: Secondary | ICD-10-CM | POA: Diagnosis not present

## 2020-09-18 DIAGNOSIS — H353131 Nonexudative age-related macular degeneration, bilateral, early dry stage: Secondary | ICD-10-CM | POA: Diagnosis not present

## 2020-10-07 DIAGNOSIS — N1831 Chronic kidney disease, stage 3a: Secondary | ICD-10-CM | POA: Diagnosis not present

## 2020-10-07 DIAGNOSIS — E7800 Pure hypercholesterolemia, unspecified: Secondary | ICD-10-CM | POA: Diagnosis not present

## 2020-10-07 DIAGNOSIS — K219 Gastro-esophageal reflux disease without esophagitis: Secondary | ICD-10-CM | POA: Diagnosis not present

## 2020-10-07 DIAGNOSIS — I1310 Hypertensive heart and chronic kidney disease without heart failure, with stage 1 through stage 4 chronic kidney disease, or unspecified chronic kidney disease: Secondary | ICD-10-CM | POA: Diagnosis not present

## 2020-10-31 DIAGNOSIS — M25512 Pain in left shoulder: Secondary | ICD-10-CM | POA: Diagnosis not present

## 2020-11-13 DIAGNOSIS — Z85828 Personal history of other malignant neoplasm of skin: Secondary | ICD-10-CM | POA: Diagnosis not present

## 2020-11-13 DIAGNOSIS — B351 Tinea unguium: Secondary | ICD-10-CM | POA: Diagnosis not present

## 2020-11-13 DIAGNOSIS — N1831 Chronic kidney disease, stage 3a: Secondary | ICD-10-CM | POA: Diagnosis not present

## 2020-11-13 DIAGNOSIS — Z23 Encounter for immunization: Secondary | ICD-10-CM | POA: Diagnosis not present

## 2020-11-13 DIAGNOSIS — I272 Pulmonary hypertension, unspecified: Secondary | ICD-10-CM | POA: Diagnosis not present

## 2020-11-13 DIAGNOSIS — I209 Angina pectoris, unspecified: Secondary | ICD-10-CM | POA: Diagnosis not present

## 2020-11-13 DIAGNOSIS — R7302 Impaired glucose tolerance (oral): Secondary | ICD-10-CM | POA: Diagnosis not present

## 2020-11-13 DIAGNOSIS — E7800 Pure hypercholesterolemia, unspecified: Secondary | ICD-10-CM | POA: Diagnosis not present

## 2020-11-13 DIAGNOSIS — Z9861 Coronary angioplasty status: Secondary | ICD-10-CM | POA: Diagnosis not present

## 2020-11-13 DIAGNOSIS — E663 Overweight: Secondary | ICD-10-CM | POA: Diagnosis not present

## 2020-11-13 DIAGNOSIS — I1310 Hypertensive heart and chronic kidney disease without heart failure, with stage 1 through stage 4 chronic kidney disease, or unspecified chronic kidney disease: Secondary | ICD-10-CM | POA: Diagnosis not present

## 2020-11-13 DIAGNOSIS — C44629 Squamous cell carcinoma of skin of left upper limb, including shoulder: Secondary | ICD-10-CM | POA: Diagnosis not present

## 2020-11-13 DIAGNOSIS — D692 Other nonthrombocytopenic purpura: Secondary | ICD-10-CM | POA: Diagnosis not present

## 2020-11-14 DIAGNOSIS — N5201 Erectile dysfunction due to arterial insufficiency: Secondary | ICD-10-CM | POA: Diagnosis not present

## 2020-11-14 DIAGNOSIS — N401 Enlarged prostate with lower urinary tract symptoms: Secondary | ICD-10-CM | POA: Diagnosis not present

## 2020-11-14 DIAGNOSIS — R351 Nocturia: Secondary | ICD-10-CM | POA: Diagnosis not present

## 2020-12-07 DIAGNOSIS — B351 Tinea unguium: Secondary | ICD-10-CM | POA: Diagnosis not present

## 2020-12-07 DIAGNOSIS — Z85828 Personal history of other malignant neoplasm of skin: Secondary | ICD-10-CM | POA: Diagnosis not present

## 2020-12-07 DIAGNOSIS — E7800 Pure hypercholesterolemia, unspecified: Secondary | ICD-10-CM | POA: Diagnosis not present

## 2020-12-07 DIAGNOSIS — K219 Gastro-esophageal reflux disease without esophagitis: Secondary | ICD-10-CM | POA: Diagnosis not present

## 2020-12-07 DIAGNOSIS — N1831 Chronic kidney disease, stage 3a: Secondary | ICD-10-CM | POA: Diagnosis not present

## 2020-12-07 DIAGNOSIS — I1310 Hypertensive heart and chronic kidney disease without heart failure, with stage 1 through stage 4 chronic kidney disease, or unspecified chronic kidney disease: Secondary | ICD-10-CM | POA: Diagnosis not present

## 2020-12-10 ENCOUNTER — Telehealth: Payer: Self-pay

## 2020-12-10 NOTE — Telephone Encounter (Signed)
Pts wife called and stated that the pt was taken off of renexa at the last office visit. Pt has been having chest pain when working, but it goes away once he sits down for a bit. Pts BP has been running a little bit low, but this morning his bp was 113/79. Pts wife said that the Pts voice also goes in and out like he has a hard time talking sometimes. They are unsure of what to do. Please advise.

## 2020-12-10 NOTE — Telephone Encounter (Signed)
I am beginning to think his chest pain could be related to GI problems including reflux. This can cause voice issues as well. I think better to discuss with PCP and maybe consider GI referral.

## 2020-12-11 NOTE — Telephone Encounter (Signed)
Pt called back, pt aware.

## 2020-12-11 NOTE — Telephone Encounter (Signed)
Attempted to call pt, no answer. Unable to leave VM requesting call back.

## 2021-01-07 DIAGNOSIS — M545 Low back pain, unspecified: Secondary | ICD-10-CM | POA: Diagnosis not present

## 2021-01-07 DIAGNOSIS — M1612 Unilateral primary osteoarthritis, left hip: Secondary | ICD-10-CM | POA: Diagnosis not present

## 2021-02-04 DIAGNOSIS — M160 Bilateral primary osteoarthritis of hip: Secondary | ICD-10-CM | POA: Diagnosis not present

## 2021-02-15 DIAGNOSIS — Z9181 History of falling: Secondary | ICD-10-CM | POA: Diagnosis not present

## 2021-02-15 DIAGNOSIS — M6281 Muscle weakness (generalized): Secondary | ICD-10-CM | POA: Diagnosis not present

## 2021-02-19 DIAGNOSIS — Z9181 History of falling: Secondary | ICD-10-CM | POA: Diagnosis not present

## 2021-02-19 DIAGNOSIS — M6281 Muscle weakness (generalized): Secondary | ICD-10-CM | POA: Diagnosis not present

## 2021-02-22 DIAGNOSIS — M6281 Muscle weakness (generalized): Secondary | ICD-10-CM | POA: Diagnosis not present

## 2021-02-22 DIAGNOSIS — Z9181 History of falling: Secondary | ICD-10-CM | POA: Diagnosis not present

## 2021-02-26 DIAGNOSIS — Z9181 History of falling: Secondary | ICD-10-CM | POA: Diagnosis not present

## 2021-02-26 DIAGNOSIS — M6281 Muscle weakness (generalized): Secondary | ICD-10-CM | POA: Diagnosis not present

## 2021-02-28 DIAGNOSIS — Z9181 History of falling: Secondary | ICD-10-CM | POA: Diagnosis not present

## 2021-02-28 DIAGNOSIS — M6281 Muscle weakness (generalized): Secondary | ICD-10-CM | POA: Diagnosis not present

## 2021-03-07 ENCOUNTER — Encounter: Payer: Self-pay | Admitting: Cardiology

## 2021-03-07 ENCOUNTER — Other Ambulatory Visit: Payer: Self-pay

## 2021-03-07 ENCOUNTER — Ambulatory Visit: Payer: PPO | Admitting: Cardiology

## 2021-03-07 VITALS — BP 147/78 | HR 66 | Temp 97.7°F | Resp 17 | Ht 67.0 in | Wt 184.0 lb

## 2021-03-07 DIAGNOSIS — I10 Essential (primary) hypertension: Secondary | ICD-10-CM | POA: Diagnosis not present

## 2021-03-07 DIAGNOSIS — I25118 Atherosclerotic heart disease of native coronary artery with other forms of angina pectoris: Secondary | ICD-10-CM | POA: Diagnosis not present

## 2021-03-07 DIAGNOSIS — R55 Syncope and collapse: Secondary | ICD-10-CM

## 2021-03-07 MED ORDER — HYDRALAZINE HCL 25 MG PO TABS: 25.0000 mg | ORAL_TABLET | Freq: Three times a day (TID) | ORAL | 3 refills | Status: DC | PRN
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
## 2021-03-08 DIAGNOSIS — E7800 Pure hypercholesterolemia, unspecified: Secondary | ICD-10-CM | POA: Diagnosis not present

## 2021-03-08 DIAGNOSIS — I1310 Hypertensive heart and chronic kidney disease without heart failure, with stage 1 through stage 4 chronic kidney disease, or unspecified chronic kidney disease: Secondary | ICD-10-CM | POA: Diagnosis not present

## 2021-03-08 DIAGNOSIS — K219 Gastro-esophageal reflux disease without esophagitis: Secondary | ICD-10-CM | POA: Diagnosis not present

## 2021-03-08 DIAGNOSIS — N1831 Chronic kidney disease, stage 3a: Secondary | ICD-10-CM | POA: Diagnosis not present

## 2021-03-13 ENCOUNTER — Inpatient Hospital Stay: Payer: PPO | Admitting: Cardiology

## 2021-03-13 ENCOUNTER — Other Ambulatory Visit: Payer: Self-pay

## 2021-03-13 DIAGNOSIS — Z9181 History of falling: Secondary | ICD-10-CM | POA: Diagnosis not present

## 2021-03-13 DIAGNOSIS — M6281 Muscle weakness (generalized): Secondary | ICD-10-CM | POA: Diagnosis not present

## 2021-03-13 DIAGNOSIS — R55 Syncope and collapse: Secondary | ICD-10-CM

## 2021-03-21 DIAGNOSIS — Z9181 History of falling: Secondary | ICD-10-CM | POA: Diagnosis not present

## 2021-03-21 DIAGNOSIS — M6281 Muscle weakness (generalized): Secondary | ICD-10-CM | POA: Diagnosis not present

## 2021-03-22 DIAGNOSIS — M6281 Muscle weakness (generalized): Secondary | ICD-10-CM | POA: Diagnosis not present

## 2021-03-22 DIAGNOSIS — Z9181 History of falling: Secondary | ICD-10-CM | POA: Diagnosis not present

## 2021-03-25 DIAGNOSIS — M160 Bilateral primary osteoarthritis of hip: Secondary | ICD-10-CM | POA: Diagnosis not present

## 2021-03-27 DIAGNOSIS — M6281 Muscle weakness (generalized): Secondary | ICD-10-CM | POA: Diagnosis not present

## 2021-03-27 DIAGNOSIS — Z9181 History of falling: Secondary | ICD-10-CM | POA: Diagnosis not present

## 2021-03-29 DIAGNOSIS — M6281 Muscle weakness (generalized): Secondary | ICD-10-CM | POA: Diagnosis not present

## 2021-03-29 DIAGNOSIS — Z9181 History of falling: Secondary | ICD-10-CM | POA: Diagnosis not present

## 2021-04-03 DIAGNOSIS — R55 Syncope and collapse: Secondary | ICD-10-CM | POA: Diagnosis not present

## 2021-04-05 NOTE — Progress Notes (Signed)
[name] called and spoke with adam. Loop implantation is scheduled 04/09/2021 at 2:30 pm.

## 2021-04-05 NOTE — Progress Notes (Signed)
I have discussed the findings of the EKG monitoring, unfortunately patient did not have any episodes, normal sinus rhythm.  As syncopal episodes are scattered in time and space, I have recommended that we proceed with loop recorder implantation as previously discussed in my office note and office visit recently.  Patient and wife both agree that we should proceed with loop implantation.  I will set this up.

## 2021-04-08 DIAGNOSIS — L820 Inflamed seborrheic keratosis: Secondary | ICD-10-CM | POA: Diagnosis not present

## 2021-04-08 DIAGNOSIS — Z85828 Personal history of other malignant neoplasm of skin: Secondary | ICD-10-CM | POA: Diagnosis not present

## 2021-04-08 DIAGNOSIS — L738 Other specified follicular disorders: Secondary | ICD-10-CM | POA: Diagnosis not present

## 2021-04-08 DIAGNOSIS — L603 Nail dystrophy: Secondary | ICD-10-CM | POA: Diagnosis not present

## 2021-04-08 DIAGNOSIS — L570 Actinic keratosis: Secondary | ICD-10-CM | POA: Diagnosis not present

## 2021-04-08 DIAGNOSIS — L821 Other seborrheic keratosis: Secondary | ICD-10-CM | POA: Diagnosis not present

## 2021-04-08 DIAGNOSIS — D692 Other nonthrombocytopenic purpura: Secondary | ICD-10-CM | POA: Diagnosis not present

## 2021-04-09 ENCOUNTER — Ambulatory Visit: Payer: PPO | Admitting: Cardiology

## 2021-04-09 ENCOUNTER — Other Ambulatory Visit: Payer: Self-pay

## 2021-04-09 ENCOUNTER — Encounter: Payer: Self-pay | Admitting: Cardiology

## 2021-04-09 ENCOUNTER — Other Ambulatory Visit: Payer: Self-pay | Admitting: Cardiology

## 2021-04-09 VITALS — BP 132/62 | HR 66 | Temp 98.0°F | Resp 16 | Ht 67.0 in | Wt 182.0 lb

## 2021-04-09 DIAGNOSIS — I25118 Atherosclerotic heart disease of native coronary artery with other forms of angina pectoris: Secondary | ICD-10-CM

## 2021-04-09 DIAGNOSIS — R55 Syncope and collapse: Secondary | ICD-10-CM | POA: Insufficient documentation

## 2021-04-09 DIAGNOSIS — Z95818 Presence of other cardiac implants and grafts: Secondary | ICD-10-CM | POA: Diagnosis not present

## 2021-04-09 HISTORY — DX: Presence of other cardiac implants and grafts: Z95.818
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
## 2021-04-16 ENCOUNTER — Ambulatory Visit: Payer: PPO | Admitting: Student

## 2021-04-16 ENCOUNTER — Other Ambulatory Visit: Payer: Self-pay

## 2021-04-16 ENCOUNTER — Encounter: Payer: Self-pay | Admitting: Student

## 2021-04-16 VITALS — BP 139/73 | HR 63 | Temp 97.3°F | Resp 17 | Ht 67.0 in | Wt 183.6 lb

## 2021-04-16 DIAGNOSIS — Z95818 Presence of other cardiac implants and grafts: Secondary | ICD-10-CM

## 2021-04-16 NOTE — Progress Notes (Signed)
Chief Complaint  Patient presents with   site check    1 week    HPI: Patient with recurrent syncope.  Underwent Biotronik loop recorder implantation by Dr. Einar Gip 04/09/2020.  He now presents for insertion site check.    Patient underwent loop recorder implantation 7 days ago, which required closure with Steri-strips. Patient denies pain, redness, or drainage from the wound. The area was cleaned and implantation site was examined. Informed patient to monitor the area for signs of infection and to let the office know if these develop, they expressed understanding.  Loop recorder insertion site is healing well.  Does have mild surrounding ecchymosis and small hematoma at the medial end of incision site. Mildly tender to palpation.  Advised patient to continue to monitor the area, he is aware of signs and symptoms that would warrant further evaluation.  Patient also asked for clarification regarding hydralazine dosing.  Advised patient that he may take hydralazine up to 3 times daily as needed for elevated blood pressure.    ICD-10-CM   1. Loop Biotronik Biomonitor III for syncope and collapse 04/09/2021  Z95.818        Alethia Berthold, PA-C 04/16/2021, 3:10 PM Office: [phone]

## 2021-04-22 DIAGNOSIS — M1612 Unilateral primary osteoarthritis, left hip: Secondary | ICD-10-CM | POA: Diagnosis not present

## 2021-04-22 DIAGNOSIS — M160 Bilateral primary osteoarthritis of hip: Secondary | ICD-10-CM | POA: Diagnosis not present

## 2021-04-23 ENCOUNTER — Ambulatory Visit: Payer: PPO | Admitting: Student

## 2021-04-23 ENCOUNTER — Other Ambulatory Visit: Payer: Self-pay

## 2021-04-23 DIAGNOSIS — Z95818 Presence of other cardiac implants and grafts: Secondary | ICD-10-CM

## 2021-04-23 NOTE — Progress Notes (Signed)
ICD-10-CM   1. Loop Biotronik Biomonitor III for syncope and collapse 04/09/2021  Z95.818      Patient comes in today with concerns that his loop recorder has moved. The site continues to heal well without drainage, erythema or bruising. Site is tender to palpation. Loop recorder signal is good.      Will discuss with Dr. Einar Gip and let patient know regarding further recommendations.    Alethia Berthold, PA-C 04/23/2021, 3:01 PM Office: [phone]

## 2021-05-07 ENCOUNTER — Other Ambulatory Visit: Payer: Self-pay | Admitting: Cardiology

## 2021-05-07 DIAGNOSIS — E782 Mixed hyperlipidemia: Secondary | ICD-10-CM

## 2021-05-08 DIAGNOSIS — N1831 Chronic kidney disease, stage 3a: Secondary | ICD-10-CM | POA: Diagnosis not present

## 2021-05-08 DIAGNOSIS — E7800 Pure hypercholesterolemia, unspecified: Secondary | ICD-10-CM | POA: Diagnosis not present

## 2021-05-08 DIAGNOSIS — E291 Testicular hypofunction: Secondary | ICD-10-CM | POA: Diagnosis not present

## 2021-05-08 DIAGNOSIS — E1129 Type 2 diabetes mellitus with other diabetic kidney complication: Secondary | ICD-10-CM | POA: Diagnosis not present

## 2021-05-08 DIAGNOSIS — R7302 Impaired glucose tolerance (oral): Secondary | ICD-10-CM | POA: Diagnosis not present

## 2021-05-08 DIAGNOSIS — R8281 Pyuria: Secondary | ICD-10-CM | POA: Diagnosis not present

## 2021-05-08 DIAGNOSIS — Z0000 Encounter for general adult medical examination without abnormal findings: Secondary | ICD-10-CM | POA: Diagnosis not present

## 2021-05-08 DIAGNOSIS — Z125 Encounter for screening for malignant neoplasm of prostate: Secondary | ICD-10-CM | POA: Diagnosis not present

## 2021-05-09 DIAGNOSIS — Z4509 Encounter for adjustment and management of other cardiac device: Secondary | ICD-10-CM | POA: Diagnosis not present

## 2021-05-09 DIAGNOSIS — Z95818 Presence of other cardiac implants and grafts: Secondary | ICD-10-CM | POA: Diagnosis not present

## 2021-05-09 DIAGNOSIS — R55 Syncope and collapse: Secondary | ICD-10-CM | POA: Diagnosis not present

## 2021-05-10 ENCOUNTER — Encounter: Payer: Self-pay | Admitting: Cardiology

## 2021-05-10 DIAGNOSIS — F5221 Male erectile disorder: Secondary | ICD-10-CM | POA: Diagnosis not present

## 2021-05-10 DIAGNOSIS — Z0000 Encounter for general adult medical examination without abnormal findings: Secondary | ICD-10-CM | POA: Diagnosis not present

## 2021-05-10 DIAGNOSIS — N1831 Chronic kidney disease, stage 3a: Secondary | ICD-10-CM | POA: Diagnosis not present

## 2021-05-10 DIAGNOSIS — I209 Angina pectoris, unspecified: Secondary | ICD-10-CM | POA: Diagnosis not present

## 2021-05-10 DIAGNOSIS — D692 Other nonthrombocytopenic purpura: Secondary | ICD-10-CM | POA: Diagnosis not present

## 2021-05-10 DIAGNOSIS — E663 Overweight: Secondary | ICD-10-CM | POA: Diagnosis not present

## 2021-05-10 DIAGNOSIS — I272 Pulmonary hypertension, unspecified: Secondary | ICD-10-CM | POA: Diagnosis not present

## 2021-05-10 DIAGNOSIS — I1310 Hypertensive heart and chronic kidney disease without heart failure, with stage 1 through stage 4 chronic kidney disease, or unspecified chronic kidney disease: Secondary | ICD-10-CM | POA: Diagnosis not present

## 2021-05-10 DIAGNOSIS — Z9861 Coronary angioplasty status: Secondary | ICD-10-CM | POA: Diagnosis not present

## 2021-05-10 DIAGNOSIS — R7302 Impaired glucose tolerance (oral): Secondary | ICD-10-CM | POA: Diagnosis not present

## 2021-05-10 DIAGNOSIS — E7800 Pure hypercholesterolemia, unspecified: Secondary | ICD-10-CM | POA: Diagnosis not present

## 2021-05-10 DIAGNOSIS — N401 Enlarged prostate with lower urinary tract symptoms: Secondary | ICD-10-CM | POA: Diagnosis not present

## 2021-06-07 DIAGNOSIS — N1831 Chronic kidney disease, stage 3a: Secondary | ICD-10-CM | POA: Diagnosis not present

## 2021-06-07 DIAGNOSIS — I1310 Hypertensive heart and chronic kidney disease without heart failure, with stage 1 through stage 4 chronic kidney disease, or unspecified chronic kidney disease: Secondary | ICD-10-CM | POA: Diagnosis not present

## 2021-06-07 DIAGNOSIS — K219 Gastro-esophageal reflux disease without esophagitis: Secondary | ICD-10-CM | POA: Diagnosis not present

## 2021-06-07 DIAGNOSIS — E7800 Pure hypercholesterolemia, unspecified: Secondary | ICD-10-CM | POA: Diagnosis not present

## 2021-06-09 DIAGNOSIS — Z4509 Encounter for adjustment and management of other cardiac device: Secondary | ICD-10-CM | POA: Diagnosis not present

## 2021-06-09 DIAGNOSIS — R55 Syncope and collapse: Secondary | ICD-10-CM | POA: Diagnosis not present

## 2021-06-09 DIAGNOSIS — Z95818 Presence of other cardiac implants and grafts: Secondary | ICD-10-CM | POA: Diagnosis not present

## 2021-06-10 ENCOUNTER — Encounter: Payer: Self-pay | Admitting: Cardiology

## 2021-07-07 DIAGNOSIS — N1831 Chronic kidney disease, stage 3a: Secondary | ICD-10-CM | POA: Diagnosis not present

## 2021-07-07 DIAGNOSIS — I1310 Hypertensive heart and chronic kidney disease without heart failure, with stage 1 through stage 4 chronic kidney disease, or unspecified chronic kidney disease: Secondary | ICD-10-CM | POA: Diagnosis not present

## 2021-07-07 DIAGNOSIS — E7800 Pure hypercholesterolemia, unspecified: Secondary | ICD-10-CM | POA: Diagnosis not present

## 2021-07-10 DIAGNOSIS — Z4509 Encounter for adjustment and management of other cardiac device: Secondary | ICD-10-CM | POA: Diagnosis not present

## 2021-07-10 DIAGNOSIS — Z95818 Presence of other cardiac implants and grafts: Secondary | ICD-10-CM | POA: Diagnosis not present

## 2021-07-10 DIAGNOSIS — R55 Syncope and collapse: Secondary | ICD-10-CM | POA: Diagnosis not present

## 2021-07-16 DIAGNOSIS — H0102A Squamous blepharitis right eye, upper and lower eyelids: Secondary | ICD-10-CM | POA: Diagnosis not present

## 2021-07-16 DIAGNOSIS — H40023 Open angle with borderline findings, high risk, bilateral: Secondary | ICD-10-CM | POA: Diagnosis not present

## 2021-07-16 DIAGNOSIS — H04123 Dry eye syndrome of bilateral lacrimal glands: Secondary | ICD-10-CM | POA: Diagnosis not present

## 2021-07-16 DIAGNOSIS — H0102B Squamous blepharitis left eye, upper and lower eyelids: Secondary | ICD-10-CM | POA: Diagnosis not present

## 2021-07-16 DIAGNOSIS — H10021 Other mucopurulent conjunctivitis, right eye: Secondary | ICD-10-CM | POA: Diagnosis not present

## 2021-07-16 DIAGNOSIS — H353131 Nonexudative age-related macular degeneration, bilateral, early dry stage: Secondary | ICD-10-CM | POA: Diagnosis not present

## 2021-07-16 DIAGNOSIS — H11823 Conjunctivochalasis, bilateral: Secondary | ICD-10-CM | POA: Diagnosis not present

## 2021-07-22 DIAGNOSIS — M160 Bilateral primary osteoarthritis of hip: Secondary | ICD-10-CM | POA: Diagnosis not present

## 2021-07-23 DIAGNOSIS — H10021 Other mucopurulent conjunctivitis, right eye: Secondary | ICD-10-CM | POA: Diagnosis not present

## 2021-07-23 DIAGNOSIS — H40023 Open angle with borderline findings, high risk, bilateral: Secondary | ICD-10-CM | POA: Diagnosis not present

## 2021-07-23 DIAGNOSIS — H353131 Nonexudative age-related macular degeneration, bilateral, early dry stage: Secondary | ICD-10-CM | POA: Diagnosis not present

## 2021-07-23 DIAGNOSIS — H0102B Squamous blepharitis left eye, upper and lower eyelids: Secondary | ICD-10-CM | POA: Diagnosis not present

## 2021-07-23 DIAGNOSIS — H04123 Dry eye syndrome of bilateral lacrimal glands: Secondary | ICD-10-CM | POA: Diagnosis not present

## 2021-07-23 DIAGNOSIS — H11823 Conjunctivochalasis, bilateral: Secondary | ICD-10-CM | POA: Diagnosis not present

## 2021-07-23 DIAGNOSIS — H0102A Squamous blepharitis right eye, upper and lower eyelids: Secondary | ICD-10-CM | POA: Diagnosis not present

## 2021-07-29 ENCOUNTER — Encounter: Payer: Self-pay | Admitting: Cardiology

## 2021-07-30 ENCOUNTER — Telehealth: Payer: Self-pay

## 2021-07-30 DIAGNOSIS — M4312 Spondylolisthesis, cervical region: Secondary | ICD-10-CM | POA: Diagnosis not present

## 2021-07-30 DIAGNOSIS — Q72891 Other reduction defects of right lower limb: Secondary | ICD-10-CM | POA: Diagnosis not present

## 2021-07-30 DIAGNOSIS — M9903 Segmental and somatic dysfunction of lumbar region: Secondary | ICD-10-CM | POA: Diagnosis not present

## 2021-07-30 DIAGNOSIS — M9901 Segmental and somatic dysfunction of cervical region: Secondary | ICD-10-CM | POA: Diagnosis not present

## 2021-07-30 DIAGNOSIS — M9904 Segmental and somatic dysfunction of sacral region: Secondary | ICD-10-CM | POA: Diagnosis not present

## 2021-07-30 DIAGNOSIS — M50323 Other cervical disc degeneration at C6-C7 level: Secondary | ICD-10-CM | POA: Diagnosis not present

## 2021-07-30 DIAGNOSIS — M5135 Other intervertebral disc degeneration, thoracolumbar region: Secondary | ICD-10-CM | POA: Diagnosis not present

## 2021-07-30 DIAGNOSIS — M50322 Other cervical disc degeneration at C5-C6 level: Secondary | ICD-10-CM | POA: Diagnosis not present

## 2021-07-30 DIAGNOSIS — M5137 Other intervertebral disc degeneration, lumbosacral region: Secondary | ICD-10-CM | POA: Diagnosis not present

## 2021-07-30 DIAGNOSIS — M9905 Segmental and somatic dysfunction of pelvic region: Secondary | ICD-10-CM | POA: Diagnosis not present

## 2021-07-30 DIAGNOSIS — M5136 Other intervertebral disc degeneration, lumbar region: Secondary | ICD-10-CM | POA: Diagnosis not present

## 2021-07-30 DIAGNOSIS — M5031 Other cervical disc degeneration,  high cervical region: Secondary | ICD-10-CM | POA: Diagnosis not present

## 2021-07-30 NOTE — Telephone Encounter (Signed)
[facility] called in regards to the pts loop recorder. They would like to know if they can use the electrical muscle stimulator. I advised them to hold off until we get a response. Please advise.  [facility] [phone]

## 2021-07-30 NOTE — Telephone Encounter (Signed)
Yes they can and it is not a pacemaker. It is not attached to his heart. It is just a loop and is monitoring heart activity

## 2021-07-31 DIAGNOSIS — M50322 Other cervical disc degeneration at C5-C6 level: Secondary | ICD-10-CM | POA: Diagnosis not present

## 2021-07-31 DIAGNOSIS — M5031 Other cervical disc degeneration,  high cervical region: Secondary | ICD-10-CM | POA: Diagnosis not present

## 2021-07-31 DIAGNOSIS — M9903 Segmental and somatic dysfunction of lumbar region: Secondary | ICD-10-CM | POA: Diagnosis not present

## 2021-07-31 DIAGNOSIS — M9904 Segmental and somatic dysfunction of sacral region: Secondary | ICD-10-CM | POA: Diagnosis not present

## 2021-07-31 DIAGNOSIS — M50323 Other cervical disc degeneration at C6-C7 level: Secondary | ICD-10-CM | POA: Diagnosis not present

## 2021-07-31 DIAGNOSIS — M5137 Other intervertebral disc degeneration, lumbosacral region: Secondary | ICD-10-CM | POA: Diagnosis not present

## 2021-07-31 DIAGNOSIS — M5135 Other intervertebral disc degeneration, thoracolumbar region: Secondary | ICD-10-CM | POA: Diagnosis not present

## 2021-07-31 DIAGNOSIS — Q72891 Other reduction defects of right lower limb: Secondary | ICD-10-CM | POA: Diagnosis not present

## 2021-07-31 DIAGNOSIS — M4312 Spondylolisthesis, cervical region: Secondary | ICD-10-CM | POA: Diagnosis not present

## 2021-07-31 DIAGNOSIS — M9901 Segmental and somatic dysfunction of cervical region: Secondary | ICD-10-CM | POA: Diagnosis not present

## 2021-07-31 DIAGNOSIS — M5136 Other intervertebral disc degeneration, lumbar region: Secondary | ICD-10-CM | POA: Diagnosis not present

## 2021-07-31 DIAGNOSIS — M9905 Segmental and somatic dysfunction of pelvic region: Secondary | ICD-10-CM | POA: Diagnosis not present

## 2021-07-31 NOTE — Telephone Encounter (Signed)
Called and spoke to [place] regarding pts loop recorder and electrical muscle stimulator. They voiced understanding.

## 2021-08-01 DIAGNOSIS — M50322 Other cervical disc degeneration at C5-C6 level: Secondary | ICD-10-CM | POA: Diagnosis not present

## 2021-08-01 DIAGNOSIS — M9905 Segmental and somatic dysfunction of pelvic region: Secondary | ICD-10-CM | POA: Diagnosis not present

## 2021-08-01 DIAGNOSIS — M9903 Segmental and somatic dysfunction of lumbar region: Secondary | ICD-10-CM | POA: Diagnosis not present

## 2021-08-01 DIAGNOSIS — M5137 Other intervertebral disc degeneration, lumbosacral region: Secondary | ICD-10-CM | POA: Diagnosis not present

## 2021-08-01 DIAGNOSIS — M50323 Other cervical disc degeneration at C6-C7 level: Secondary | ICD-10-CM | POA: Diagnosis not present

## 2021-08-01 DIAGNOSIS — M4312 Spondylolisthesis, cervical region: Secondary | ICD-10-CM | POA: Diagnosis not present

## 2021-08-01 DIAGNOSIS — M5031 Other cervical disc degeneration,  high cervical region: Secondary | ICD-10-CM | POA: Diagnosis not present

## 2021-08-01 DIAGNOSIS — M9901 Segmental and somatic dysfunction of cervical region: Secondary | ICD-10-CM | POA: Diagnosis not present

## 2021-08-01 DIAGNOSIS — Q72891 Other reduction defects of right lower limb: Secondary | ICD-10-CM | POA: Diagnosis not present

## 2021-08-01 DIAGNOSIS — M5136 Other intervertebral disc degeneration, lumbar region: Secondary | ICD-10-CM | POA: Diagnosis not present

## 2021-08-01 DIAGNOSIS — M9904 Segmental and somatic dysfunction of sacral region: Secondary | ICD-10-CM | POA: Diagnosis not present

## 2021-08-01 DIAGNOSIS — M5135 Other intervertebral disc degeneration, thoracolumbar region: Secondary | ICD-10-CM | POA: Diagnosis not present

## 2021-08-02 DIAGNOSIS — M4312 Spondylolisthesis, cervical region: Secondary | ICD-10-CM | POA: Diagnosis not present

## 2021-08-02 DIAGNOSIS — M5135 Other intervertebral disc degeneration, thoracolumbar region: Secondary | ICD-10-CM | POA: Diagnosis not present

## 2021-08-02 DIAGNOSIS — M9905 Segmental and somatic dysfunction of pelvic region: Secondary | ICD-10-CM | POA: Diagnosis not present

## 2021-08-02 DIAGNOSIS — M5137 Other intervertebral disc degeneration, lumbosacral region: Secondary | ICD-10-CM | POA: Diagnosis not present

## 2021-08-02 DIAGNOSIS — M9903 Segmental and somatic dysfunction of lumbar region: Secondary | ICD-10-CM | POA: Diagnosis not present

## 2021-08-02 DIAGNOSIS — Q72891 Other reduction defects of right lower limb: Secondary | ICD-10-CM | POA: Diagnosis not present

## 2021-08-02 DIAGNOSIS — M50322 Other cervical disc degeneration at C5-C6 level: Secondary | ICD-10-CM | POA: Diagnosis not present

## 2021-08-02 DIAGNOSIS — M5031 Other cervical disc degeneration,  high cervical region: Secondary | ICD-10-CM | POA: Diagnosis not present

## 2021-08-02 DIAGNOSIS — M50323 Other cervical disc degeneration at C6-C7 level: Secondary | ICD-10-CM | POA: Diagnosis not present

## 2021-08-02 DIAGNOSIS — M5136 Other intervertebral disc degeneration, lumbar region: Secondary | ICD-10-CM | POA: Diagnosis not present

## 2021-08-02 DIAGNOSIS — M9901 Segmental and somatic dysfunction of cervical region: Secondary | ICD-10-CM | POA: Diagnosis not present

## 2021-08-02 DIAGNOSIS — M9904 Segmental and somatic dysfunction of sacral region: Secondary | ICD-10-CM | POA: Diagnosis not present

## 2021-08-05 ENCOUNTER — Other Ambulatory Visit: Payer: Self-pay | Admitting: Cardiology

## 2021-08-06 DIAGNOSIS — M50322 Other cervical disc degeneration at C5-C6 level: Secondary | ICD-10-CM | POA: Diagnosis not present

## 2021-08-06 DIAGNOSIS — M5137 Other intervertebral disc degeneration, lumbosacral region: Secondary | ICD-10-CM | POA: Diagnosis not present

## 2021-08-06 DIAGNOSIS — M9901 Segmental and somatic dysfunction of cervical region: Secondary | ICD-10-CM | POA: Diagnosis not present

## 2021-08-06 DIAGNOSIS — Q72891 Other reduction defects of right lower limb: Secondary | ICD-10-CM | POA: Diagnosis not present

## 2021-08-06 DIAGNOSIS — M9904 Segmental and somatic dysfunction of sacral region: Secondary | ICD-10-CM | POA: Diagnosis not present

## 2021-08-06 DIAGNOSIS — M9905 Segmental and somatic dysfunction of pelvic region: Secondary | ICD-10-CM | POA: Diagnosis not present

## 2021-08-06 DIAGNOSIS — M5136 Other intervertebral disc degeneration, lumbar region: Secondary | ICD-10-CM | POA: Diagnosis not present

## 2021-08-06 DIAGNOSIS — M9903 Segmental and somatic dysfunction of lumbar region: Secondary | ICD-10-CM | POA: Diagnosis not present

## 2021-08-06 DIAGNOSIS — M5031 Other cervical disc degeneration,  high cervical region: Secondary | ICD-10-CM | POA: Diagnosis not present

## 2021-08-06 DIAGNOSIS — M5135 Other intervertebral disc degeneration, thoracolumbar region: Secondary | ICD-10-CM | POA: Diagnosis not present

## 2021-08-06 DIAGNOSIS — M4312 Spondylolisthesis, cervical region: Secondary | ICD-10-CM | POA: Diagnosis not present

## 2021-08-06 DIAGNOSIS — M50323 Other cervical disc degeneration at C6-C7 level: Secondary | ICD-10-CM | POA: Diagnosis not present

## 2021-08-06 NOTE — Telephone Encounter (Signed)
Prescribed by a Historical provider, okay to refill?

## 2021-08-07 DIAGNOSIS — M50323 Other cervical disc degeneration at C6-C7 level: Secondary | ICD-10-CM | POA: Diagnosis not present

## 2021-08-07 DIAGNOSIS — M9903 Segmental and somatic dysfunction of lumbar region: Secondary | ICD-10-CM | POA: Diagnosis not present

## 2021-08-07 DIAGNOSIS — M9905 Segmental and somatic dysfunction of pelvic region: Secondary | ICD-10-CM | POA: Diagnosis not present

## 2021-08-07 DIAGNOSIS — M5136 Other intervertebral disc degeneration, lumbar region: Secondary | ICD-10-CM | POA: Diagnosis not present

## 2021-08-07 DIAGNOSIS — M5135 Other intervertebral disc degeneration, thoracolumbar region: Secondary | ICD-10-CM | POA: Diagnosis not present

## 2021-08-07 DIAGNOSIS — M4312 Spondylolisthesis, cervical region: Secondary | ICD-10-CM | POA: Diagnosis not present

## 2021-08-07 DIAGNOSIS — M5031 Other cervical disc degeneration,  high cervical region: Secondary | ICD-10-CM | POA: Diagnosis not present

## 2021-08-07 DIAGNOSIS — M5137 Other intervertebral disc degeneration, lumbosacral region: Secondary | ICD-10-CM | POA: Diagnosis not present

## 2021-08-07 DIAGNOSIS — Q72891 Other reduction defects of right lower limb: Secondary | ICD-10-CM | POA: Diagnosis not present

## 2021-08-07 DIAGNOSIS — M9901 Segmental and somatic dysfunction of cervical region: Secondary | ICD-10-CM | POA: Diagnosis not present

## 2021-08-07 DIAGNOSIS — M50322 Other cervical disc degeneration at C5-C6 level: Secondary | ICD-10-CM | POA: Diagnosis not present

## 2021-08-07 DIAGNOSIS — M9904 Segmental and somatic dysfunction of sacral region: Secondary | ICD-10-CM | POA: Diagnosis not present

## 2021-08-08 DIAGNOSIS — M50323 Other cervical disc degeneration at C6-C7 level: Secondary | ICD-10-CM | POA: Diagnosis not present

## 2021-08-08 DIAGNOSIS — M4312 Spondylolisthesis, cervical region: Secondary | ICD-10-CM | POA: Diagnosis not present

## 2021-08-08 DIAGNOSIS — M5136 Other intervertebral disc degeneration, lumbar region: Secondary | ICD-10-CM | POA: Diagnosis not present

## 2021-08-08 DIAGNOSIS — M9901 Segmental and somatic dysfunction of cervical region: Secondary | ICD-10-CM | POA: Diagnosis not present

## 2021-08-08 DIAGNOSIS — M50322 Other cervical disc degeneration at C5-C6 level: Secondary | ICD-10-CM | POA: Diagnosis not present

## 2021-08-08 DIAGNOSIS — Q72891 Other reduction defects of right lower limb: Secondary | ICD-10-CM | POA: Diagnosis not present

## 2021-08-08 DIAGNOSIS — M9905 Segmental and somatic dysfunction of pelvic region: Secondary | ICD-10-CM | POA: Diagnosis not present

## 2021-08-08 DIAGNOSIS — M9904 Segmental and somatic dysfunction of sacral region: Secondary | ICD-10-CM | POA: Diagnosis not present

## 2021-08-08 DIAGNOSIS — M5135 Other intervertebral disc degeneration, thoracolumbar region: Secondary | ICD-10-CM | POA: Diagnosis not present

## 2021-08-08 DIAGNOSIS — M5137 Other intervertebral disc degeneration, lumbosacral region: Secondary | ICD-10-CM | POA: Diagnosis not present

## 2021-08-08 DIAGNOSIS — M9903 Segmental and somatic dysfunction of lumbar region: Secondary | ICD-10-CM | POA: Diagnosis not present

## 2021-08-08 DIAGNOSIS — M5031 Other cervical disc degeneration,  high cervical region: Secondary | ICD-10-CM | POA: Diagnosis not present

## 2021-08-10 DIAGNOSIS — Z4509 Encounter for adjustment and management of other cardiac device: Secondary | ICD-10-CM | POA: Diagnosis not present

## 2021-08-10 DIAGNOSIS — R55 Syncope and collapse: Secondary | ICD-10-CM | POA: Diagnosis not present

## 2021-08-10 DIAGNOSIS — Z95818 Presence of other cardiac implants and grafts: Secondary | ICD-10-CM | POA: Diagnosis not present

## 2021-08-12 DIAGNOSIS — M5135 Other intervertebral disc degeneration, thoracolumbar region: Secondary | ICD-10-CM | POA: Diagnosis not present

## 2021-08-12 DIAGNOSIS — M9903 Segmental and somatic dysfunction of lumbar region: Secondary | ICD-10-CM | POA: Diagnosis not present

## 2021-08-12 DIAGNOSIS — M5136 Other intervertebral disc degeneration, lumbar region: Secondary | ICD-10-CM | POA: Diagnosis not present

## 2021-08-12 DIAGNOSIS — M50322 Other cervical disc degeneration at C5-C6 level: Secondary | ICD-10-CM | POA: Diagnosis not present

## 2021-08-12 DIAGNOSIS — M9901 Segmental and somatic dysfunction of cervical region: Secondary | ICD-10-CM | POA: Diagnosis not present

## 2021-08-12 DIAGNOSIS — M5137 Other intervertebral disc degeneration, lumbosacral region: Secondary | ICD-10-CM | POA: Diagnosis not present

## 2021-08-12 DIAGNOSIS — M50323 Other cervical disc degeneration at C6-C7 level: Secondary | ICD-10-CM | POA: Diagnosis not present

## 2021-08-12 DIAGNOSIS — M9904 Segmental and somatic dysfunction of sacral region: Secondary | ICD-10-CM | POA: Diagnosis not present

## 2021-08-12 DIAGNOSIS — M5031 Other cervical disc degeneration,  high cervical region: Secondary | ICD-10-CM | POA: Diagnosis not present

## 2021-08-12 DIAGNOSIS — M4312 Spondylolisthesis, cervical region: Secondary | ICD-10-CM | POA: Diagnosis not present

## 2021-08-12 DIAGNOSIS — M9905 Segmental and somatic dysfunction of pelvic region: Secondary | ICD-10-CM | POA: Diagnosis not present

## 2021-08-12 DIAGNOSIS — Q72891 Other reduction defects of right lower limb: Secondary | ICD-10-CM | POA: Diagnosis not present

## 2021-08-14 DIAGNOSIS — M50323 Other cervical disc degeneration at C6-C7 level: Secondary | ICD-10-CM | POA: Diagnosis not present

## 2021-08-14 DIAGNOSIS — M5031 Other cervical disc degeneration,  high cervical region: Secondary | ICD-10-CM | POA: Diagnosis not present

## 2021-08-14 DIAGNOSIS — M5137 Other intervertebral disc degeneration, lumbosacral region: Secondary | ICD-10-CM | POA: Diagnosis not present

## 2021-08-14 DIAGNOSIS — M9903 Segmental and somatic dysfunction of lumbar region: Secondary | ICD-10-CM | POA: Diagnosis not present

## 2021-08-14 DIAGNOSIS — M9905 Segmental and somatic dysfunction of pelvic region: Secondary | ICD-10-CM | POA: Diagnosis not present

## 2021-08-14 DIAGNOSIS — M9901 Segmental and somatic dysfunction of cervical region: Secondary | ICD-10-CM | POA: Diagnosis not present

## 2021-08-14 DIAGNOSIS — M50322 Other cervical disc degeneration at C5-C6 level: Secondary | ICD-10-CM | POA: Diagnosis not present

## 2021-08-14 DIAGNOSIS — Q72891 Other reduction defects of right lower limb: Secondary | ICD-10-CM | POA: Diagnosis not present

## 2021-08-14 DIAGNOSIS — M9904 Segmental and somatic dysfunction of sacral region: Secondary | ICD-10-CM | POA: Diagnosis not present

## 2021-08-14 DIAGNOSIS — M5136 Other intervertebral disc degeneration, lumbar region: Secondary | ICD-10-CM | POA: Diagnosis not present

## 2021-08-14 DIAGNOSIS — M4312 Spondylolisthesis, cervical region: Secondary | ICD-10-CM | POA: Diagnosis not present

## 2021-08-14 DIAGNOSIS — M5135 Other intervertebral disc degeneration, thoracolumbar region: Secondary | ICD-10-CM | POA: Diagnosis not present

## 2021-08-15 DIAGNOSIS — M50323 Other cervical disc degeneration at C6-C7 level: Secondary | ICD-10-CM | POA: Diagnosis not present

## 2021-08-15 DIAGNOSIS — M9901 Segmental and somatic dysfunction of cervical region: Secondary | ICD-10-CM | POA: Diagnosis not present

## 2021-08-15 DIAGNOSIS — M5137 Other intervertebral disc degeneration, lumbosacral region: Secondary | ICD-10-CM | POA: Diagnosis not present

## 2021-08-15 DIAGNOSIS — M5136 Other intervertebral disc degeneration, lumbar region: Secondary | ICD-10-CM | POA: Diagnosis not present

## 2021-08-15 DIAGNOSIS — M9903 Segmental and somatic dysfunction of lumbar region: Secondary | ICD-10-CM | POA: Diagnosis not present

## 2021-08-15 DIAGNOSIS — M9905 Segmental and somatic dysfunction of pelvic region: Secondary | ICD-10-CM | POA: Diagnosis not present

## 2021-08-15 DIAGNOSIS — M4312 Spondylolisthesis, cervical region: Secondary | ICD-10-CM | POA: Diagnosis not present

## 2021-08-15 DIAGNOSIS — M5031 Other cervical disc degeneration,  high cervical region: Secondary | ICD-10-CM | POA: Diagnosis not present

## 2021-08-15 DIAGNOSIS — M9904 Segmental and somatic dysfunction of sacral region: Secondary | ICD-10-CM | POA: Diagnosis not present

## 2021-08-15 DIAGNOSIS — M50322 Other cervical disc degeneration at C5-C6 level: Secondary | ICD-10-CM | POA: Diagnosis not present

## 2021-08-15 DIAGNOSIS — Q72891 Other reduction defects of right lower limb: Secondary | ICD-10-CM | POA: Diagnosis not present

## 2021-08-15 DIAGNOSIS — M5135 Other intervertebral disc degeneration, thoracolumbar region: Secondary | ICD-10-CM | POA: Diagnosis not present

## 2021-08-19 DIAGNOSIS — Q72891 Other reduction defects of right lower limb: Secondary | ICD-10-CM | POA: Diagnosis not present

## 2021-08-19 DIAGNOSIS — M50322 Other cervical disc degeneration at C5-C6 level: Secondary | ICD-10-CM | POA: Diagnosis not present

## 2021-08-19 DIAGNOSIS — M5136 Other intervertebral disc degeneration, lumbar region: Secondary | ICD-10-CM | POA: Diagnosis not present

## 2021-08-19 DIAGNOSIS — M4312 Spondylolisthesis, cervical region: Secondary | ICD-10-CM | POA: Diagnosis not present

## 2021-08-19 DIAGNOSIS — M5031 Other cervical disc degeneration,  high cervical region: Secondary | ICD-10-CM | POA: Diagnosis not present

## 2021-08-19 DIAGNOSIS — M9905 Segmental and somatic dysfunction of pelvic region: Secondary | ICD-10-CM | POA: Diagnosis not present

## 2021-08-19 DIAGNOSIS — M9904 Segmental and somatic dysfunction of sacral region: Secondary | ICD-10-CM | POA: Diagnosis not present

## 2021-08-19 DIAGNOSIS — M9903 Segmental and somatic dysfunction of lumbar region: Secondary | ICD-10-CM | POA: Diagnosis not present

## 2021-08-19 DIAGNOSIS — M5135 Other intervertebral disc degeneration, thoracolumbar region: Secondary | ICD-10-CM | POA: Diagnosis not present

## 2021-08-19 DIAGNOSIS — M9901 Segmental and somatic dysfunction of cervical region: Secondary | ICD-10-CM | POA: Diagnosis not present

## 2021-08-19 DIAGNOSIS — M50323 Other cervical disc degeneration at C6-C7 level: Secondary | ICD-10-CM | POA: Diagnosis not present

## 2021-08-19 DIAGNOSIS — M5137 Other intervertebral disc degeneration, lumbosacral region: Secondary | ICD-10-CM | POA: Diagnosis not present

## 2021-08-20 ENCOUNTER — Other Ambulatory Visit: Payer: Self-pay | Admitting: Cardiology

## 2021-08-21 DIAGNOSIS — M50322 Other cervical disc degeneration at C5-C6 level: Secondary | ICD-10-CM | POA: Diagnosis not present

## 2021-08-21 DIAGNOSIS — M50323 Other cervical disc degeneration at C6-C7 level: Secondary | ICD-10-CM | POA: Diagnosis not present

## 2021-08-21 DIAGNOSIS — M9901 Segmental and somatic dysfunction of cervical region: Secondary | ICD-10-CM | POA: Diagnosis not present

## 2021-08-21 DIAGNOSIS — M9905 Segmental and somatic dysfunction of pelvic region: Secondary | ICD-10-CM | POA: Diagnosis not present

## 2021-08-21 DIAGNOSIS — M5136 Other intervertebral disc degeneration, lumbar region: Secondary | ICD-10-CM | POA: Diagnosis not present

## 2021-08-21 DIAGNOSIS — M5137 Other intervertebral disc degeneration, lumbosacral region: Secondary | ICD-10-CM | POA: Diagnosis not present

## 2021-08-21 DIAGNOSIS — M4312 Spondylolisthesis, cervical region: Secondary | ICD-10-CM | POA: Diagnosis not present

## 2021-08-21 DIAGNOSIS — M5135 Other intervertebral disc degeneration, thoracolumbar region: Secondary | ICD-10-CM | POA: Diagnosis not present

## 2021-08-21 DIAGNOSIS — M5031 Other cervical disc degeneration,  high cervical region: Secondary | ICD-10-CM | POA: Diagnosis not present

## 2021-08-21 DIAGNOSIS — M9903 Segmental and somatic dysfunction of lumbar region: Secondary | ICD-10-CM | POA: Diagnosis not present

## 2021-08-21 DIAGNOSIS — M9904 Segmental and somatic dysfunction of sacral region: Secondary | ICD-10-CM | POA: Diagnosis not present

## 2021-08-21 DIAGNOSIS — Q72891 Other reduction defects of right lower limb: Secondary | ICD-10-CM | POA: Diagnosis not present

## 2021-08-22 DIAGNOSIS — M5135 Other intervertebral disc degeneration, thoracolumbar region: Secondary | ICD-10-CM | POA: Diagnosis not present

## 2021-08-22 DIAGNOSIS — M5137 Other intervertebral disc degeneration, lumbosacral region: Secondary | ICD-10-CM | POA: Diagnosis not present

## 2021-08-22 DIAGNOSIS — Q72891 Other reduction defects of right lower limb: Secondary | ICD-10-CM | POA: Diagnosis not present

## 2021-08-22 DIAGNOSIS — M50322 Other cervical disc degeneration at C5-C6 level: Secondary | ICD-10-CM | POA: Diagnosis not present

## 2021-08-22 DIAGNOSIS — M50323 Other cervical disc degeneration at C6-C7 level: Secondary | ICD-10-CM | POA: Diagnosis not present

## 2021-08-22 DIAGNOSIS — M9901 Segmental and somatic dysfunction of cervical region: Secondary | ICD-10-CM | POA: Diagnosis not present

## 2021-08-22 DIAGNOSIS — M9905 Segmental and somatic dysfunction of pelvic region: Secondary | ICD-10-CM | POA: Diagnosis not present

## 2021-08-22 DIAGNOSIS — M9903 Segmental and somatic dysfunction of lumbar region: Secondary | ICD-10-CM | POA: Diagnosis not present

## 2021-08-22 DIAGNOSIS — M9904 Segmental and somatic dysfunction of sacral region: Secondary | ICD-10-CM | POA: Diagnosis not present

## 2021-08-22 DIAGNOSIS — M5031 Other cervical disc degeneration,  high cervical region: Secondary | ICD-10-CM | POA: Diagnosis not present

## 2021-08-22 DIAGNOSIS — M5136 Other intervertebral disc degeneration, lumbar region: Secondary | ICD-10-CM | POA: Diagnosis not present

## 2021-08-22 DIAGNOSIS — M4312 Spondylolisthesis, cervical region: Secondary | ICD-10-CM | POA: Diagnosis not present

## 2021-08-26 DIAGNOSIS — M9903 Segmental and somatic dysfunction of lumbar region: Secondary | ICD-10-CM | POA: Diagnosis not present

## 2021-08-26 DIAGNOSIS — M9905 Segmental and somatic dysfunction of pelvic region: Secondary | ICD-10-CM | POA: Diagnosis not present

## 2021-08-26 DIAGNOSIS — M5136 Other intervertebral disc degeneration, lumbar region: Secondary | ICD-10-CM | POA: Diagnosis not present

## 2021-08-26 DIAGNOSIS — Q72891 Other reduction defects of right lower limb: Secondary | ICD-10-CM | POA: Diagnosis not present

## 2021-08-26 DIAGNOSIS — M5135 Other intervertebral disc degeneration, thoracolumbar region: Secondary | ICD-10-CM | POA: Diagnosis not present

## 2021-08-26 DIAGNOSIS — M9901 Segmental and somatic dysfunction of cervical region: Secondary | ICD-10-CM | POA: Diagnosis not present

## 2021-08-26 DIAGNOSIS — M50323 Other cervical disc degeneration at C6-C7 level: Secondary | ICD-10-CM | POA: Diagnosis not present

## 2021-08-26 DIAGNOSIS — M9904 Segmental and somatic dysfunction of sacral region: Secondary | ICD-10-CM | POA: Diagnosis not present

## 2021-08-26 DIAGNOSIS — M5031 Other cervical disc degeneration,  high cervical region: Secondary | ICD-10-CM | POA: Diagnosis not present

## 2021-08-26 DIAGNOSIS — M4312 Spondylolisthesis, cervical region: Secondary | ICD-10-CM | POA: Diagnosis not present

## 2021-08-26 DIAGNOSIS — M5137 Other intervertebral disc degeneration, lumbosacral region: Secondary | ICD-10-CM | POA: Diagnosis not present

## 2021-08-26 DIAGNOSIS — M50322 Other cervical disc degeneration at C5-C6 level: Secondary | ICD-10-CM | POA: Diagnosis not present

## 2021-08-28 DIAGNOSIS — M50323 Other cervical disc degeneration at C6-C7 level: Secondary | ICD-10-CM | POA: Diagnosis not present

## 2021-08-28 DIAGNOSIS — M5137 Other intervertebral disc degeneration, lumbosacral region: Secondary | ICD-10-CM | POA: Diagnosis not present

## 2021-08-28 DIAGNOSIS — M9901 Segmental and somatic dysfunction of cervical region: Secondary | ICD-10-CM | POA: Diagnosis not present

## 2021-08-28 DIAGNOSIS — Q72891 Other reduction defects of right lower limb: Secondary | ICD-10-CM | POA: Diagnosis not present

## 2021-08-28 DIAGNOSIS — M9905 Segmental and somatic dysfunction of pelvic region: Secondary | ICD-10-CM | POA: Diagnosis not present

## 2021-08-28 DIAGNOSIS — M4312 Spondylolisthesis, cervical region: Secondary | ICD-10-CM | POA: Diagnosis not present

## 2021-08-28 DIAGNOSIS — M50322 Other cervical disc degeneration at C5-C6 level: Secondary | ICD-10-CM | POA: Diagnosis not present

## 2021-08-28 DIAGNOSIS — M5135 Other intervertebral disc degeneration, thoracolumbar region: Secondary | ICD-10-CM | POA: Diagnosis not present

## 2021-08-28 DIAGNOSIS — M5136 Other intervertebral disc degeneration, lumbar region: Secondary | ICD-10-CM | POA: Diagnosis not present

## 2021-08-28 DIAGNOSIS — M9904 Segmental and somatic dysfunction of sacral region: Secondary | ICD-10-CM | POA: Diagnosis not present

## 2021-08-28 DIAGNOSIS — M5031 Other cervical disc degeneration,  high cervical region: Secondary | ICD-10-CM | POA: Diagnosis not present

## 2021-08-28 DIAGNOSIS — M9903 Segmental and somatic dysfunction of lumbar region: Secondary | ICD-10-CM | POA: Diagnosis not present

## 2021-08-29 DIAGNOSIS — M4312 Spondylolisthesis, cervical region: Secondary | ICD-10-CM | POA: Diagnosis not present

## 2021-08-29 DIAGNOSIS — Q72891 Other reduction defects of right lower limb: Secondary | ICD-10-CM | POA: Diagnosis not present

## 2021-08-29 DIAGNOSIS — M9901 Segmental and somatic dysfunction of cervical region: Secondary | ICD-10-CM | POA: Diagnosis not present

## 2021-08-29 DIAGNOSIS — M9905 Segmental and somatic dysfunction of pelvic region: Secondary | ICD-10-CM | POA: Diagnosis not present

## 2021-08-29 DIAGNOSIS — M5031 Other cervical disc degeneration,  high cervical region: Secondary | ICD-10-CM | POA: Diagnosis not present

## 2021-08-29 DIAGNOSIS — M9903 Segmental and somatic dysfunction of lumbar region: Secondary | ICD-10-CM | POA: Diagnosis not present

## 2021-08-29 DIAGNOSIS — M50323 Other cervical disc degeneration at C6-C7 level: Secondary | ICD-10-CM | POA: Diagnosis not present

## 2021-08-29 DIAGNOSIS — M5135 Other intervertebral disc degeneration, thoracolumbar region: Secondary | ICD-10-CM | POA: Diagnosis not present

## 2021-08-29 DIAGNOSIS — M5137 Other intervertebral disc degeneration, lumbosacral region: Secondary | ICD-10-CM | POA: Diagnosis not present

## 2021-08-29 DIAGNOSIS — M5136 Other intervertebral disc degeneration, lumbar region: Secondary | ICD-10-CM | POA: Diagnosis not present

## 2021-08-29 DIAGNOSIS — M50322 Other cervical disc degeneration at C5-C6 level: Secondary | ICD-10-CM | POA: Diagnosis not present

## 2021-08-29 DIAGNOSIS — M9904 Segmental and somatic dysfunction of sacral region: Secondary | ICD-10-CM | POA: Diagnosis not present

## 2021-09-02 DIAGNOSIS — M5136 Other intervertebral disc degeneration, lumbar region: Secondary | ICD-10-CM | POA: Diagnosis not present

## 2021-09-02 DIAGNOSIS — M50322 Other cervical disc degeneration at C5-C6 level: Secondary | ICD-10-CM | POA: Diagnosis not present

## 2021-09-02 DIAGNOSIS — M4312 Spondylolisthesis, cervical region: Secondary | ICD-10-CM | POA: Diagnosis not present

## 2021-09-02 DIAGNOSIS — M5137 Other intervertebral disc degeneration, lumbosacral region: Secondary | ICD-10-CM | POA: Diagnosis not present

## 2021-09-02 DIAGNOSIS — M5031 Other cervical disc degeneration,  high cervical region: Secondary | ICD-10-CM | POA: Diagnosis not present

## 2021-09-02 DIAGNOSIS — M9905 Segmental and somatic dysfunction of pelvic region: Secondary | ICD-10-CM | POA: Diagnosis not present

## 2021-09-02 DIAGNOSIS — Q72891 Other reduction defects of right lower limb: Secondary | ICD-10-CM | POA: Diagnosis not present

## 2021-09-02 DIAGNOSIS — M50323 Other cervical disc degeneration at C6-C7 level: Secondary | ICD-10-CM | POA: Diagnosis not present

## 2021-09-02 DIAGNOSIS — M9903 Segmental and somatic dysfunction of lumbar region: Secondary | ICD-10-CM | POA: Diagnosis not present

## 2021-09-02 DIAGNOSIS — M9904 Segmental and somatic dysfunction of sacral region: Secondary | ICD-10-CM | POA: Diagnosis not present

## 2021-09-02 DIAGNOSIS — M5135 Other intervertebral disc degeneration, thoracolumbar region: Secondary | ICD-10-CM | POA: Diagnosis not present

## 2021-09-02 DIAGNOSIS — M9901 Segmental and somatic dysfunction of cervical region: Secondary | ICD-10-CM | POA: Diagnosis not present

## 2021-09-04 DIAGNOSIS — M5135 Other intervertebral disc degeneration, thoracolumbar region: Secondary | ICD-10-CM | POA: Diagnosis not present

## 2021-09-04 DIAGNOSIS — M5031 Other cervical disc degeneration,  high cervical region: Secondary | ICD-10-CM | POA: Diagnosis not present

## 2021-09-04 DIAGNOSIS — M4312 Spondylolisthesis, cervical region: Secondary | ICD-10-CM | POA: Diagnosis not present

## 2021-09-04 DIAGNOSIS — M9903 Segmental and somatic dysfunction of lumbar region: Secondary | ICD-10-CM | POA: Diagnosis not present

## 2021-09-04 DIAGNOSIS — M9905 Segmental and somatic dysfunction of pelvic region: Secondary | ICD-10-CM | POA: Diagnosis not present

## 2021-09-04 DIAGNOSIS — M5137 Other intervertebral disc degeneration, lumbosacral region: Secondary | ICD-10-CM | POA: Diagnosis not present

## 2021-09-04 DIAGNOSIS — Q72891 Other reduction defects of right lower limb: Secondary | ICD-10-CM | POA: Diagnosis not present

## 2021-09-04 DIAGNOSIS — M9904 Segmental and somatic dysfunction of sacral region: Secondary | ICD-10-CM | POA: Diagnosis not present

## 2021-09-04 DIAGNOSIS — M50322 Other cervical disc degeneration at C5-C6 level: Secondary | ICD-10-CM | POA: Diagnosis not present

## 2021-09-04 DIAGNOSIS — M50323 Other cervical disc degeneration at C6-C7 level: Secondary | ICD-10-CM | POA: Diagnosis not present

## 2021-09-04 DIAGNOSIS — M9901 Segmental and somatic dysfunction of cervical region: Secondary | ICD-10-CM | POA: Diagnosis not present

## 2021-09-04 DIAGNOSIS — M5136 Other intervertebral disc degeneration, lumbar region: Secondary | ICD-10-CM | POA: Diagnosis not present

## 2021-09-05 ENCOUNTER — Ambulatory Visit: Payer: PPO | Admitting: Cardiology

## 2021-09-05 ENCOUNTER — Encounter: Payer: Self-pay | Admitting: Cardiology

## 2021-09-05 VITALS — BP 140/76 | HR 55 | Temp 98.2°F | Resp 16 | Ht 67.0 in | Wt 183.0 lb

## 2021-09-05 DIAGNOSIS — I10 Essential (primary) hypertension: Secondary | ICD-10-CM

## 2021-09-05 DIAGNOSIS — I25118 Atherosclerotic heart disease of native coronary artery with other forms of angina pectoris: Secondary | ICD-10-CM

## 2021-09-05 DIAGNOSIS — Z95818 Presence of other cardiac implants and grafts: Secondary | ICD-10-CM

## 2021-09-05 DIAGNOSIS — R0609 Other forms of dyspnea: Secondary | ICD-10-CM

## 2021-09-05 DIAGNOSIS — R55 Syncope and collapse: Secondary | ICD-10-CM

## 2021-09-05 MED ORDER — NITROGLYCERIN 0.4 MG SL SUBL: SUBLINGUAL_TABLET | SUBLINGUAL | 3 refills | Status: DC

## 2021-09-05 NOTE — Progress Notes (Signed)
Primary Physician/Referring:  Haywood Pao, MD  Patient ID: [name], male    DOB: [birth year], 76 y.o.   MRN: [id number]  No chief complaint on file.  HPI:    [name]  is a 76 y.o.  Caucasian male patient with coronary artery disease and chronic stable angina, mild aortic stenosis, hypertension, mixed hyperlipidemia, GERD, chronic back pain, DJD.  Since last office visit he has not had any further dizziness or falls, no syncope.  Patient is here on a 80-monthoffice visit, he has developed severe arthritis involving bilateral hips, he is now scheduled for hip arthroplasty.  Complains of dyspnea on exertion, no chest pain.  He has not used any sublingual nitroglycerin.  No PND or orthopnea.  Back pain and bilateral arthritis in his hip have been lifestyle limiting for him.  No significant change in his weight.  Past Medical History:  Diagnosis Date   Anginal pain ([place]    Arthritis    Basal cell carcinoma    back of neck   Blood transfusion without reported diagnosis 2006   during open heart surgery   CAD (coronary artery disease)    Diverticulosis 2013   Noted on Colonoscopy   Dyspnea    with activity and walking, sometime has SOB walking to mailbox and back to the house   GERD (gastroesophageal reflux disease)    History of colon polyps 2008   Noted on Colonoscopy   History of esophageal stricture 2003   Noted on EGD   Hyperlipidemia    Hypertension    IVCD (intraventricular conduction defect) 05/2018   Noted on EKG   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   Noted on EKG   Left axis deviation 05/2018   Noted on EKG   Lipoma 04/20/2018   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   Loop Biotronik Biomonitor III for syncope and collapse 04/09/2021 04/09/2021   LVH (left ventricular hypertrophy) 05/2018   Noted on EKG   Phimosis    Pneumonia    Tuberculosis 1956   Wears glasses    Wheezing    Past Surgical History:  Procedure Laterality  Date   ANGIOPLASTY  2010   3 stents placed   BACK SURGERY     [place]TRACTION [place]/ [place] BILATER[place]  2012   bilateral   CIRCUMCISION N/A 08/30/2018   Procedure: CIRCUMCISION ADULT;  Surgeon: OKathie Rhodes MD;  [place] [facility]  Service: Urology;  Laterality: N/A;   COLONOSCOPY  2013, 2008   CORONARY ARTERY BYPASS GRAFT  2006   x4   ESOPHAGOGASTRODUODENOSCOPY  10/2011   EYE SURGERY     JOINT REPLACEMENT     KNEE ARTHROSCOPY  2001   LUMBAR DISC SURGERY  1998   LUMBAR LAMINECTOMY/DECOMPRESSION MICRODISCECTOMY N/A 07/13/2019   Procedure: Laminectomy and Foraminotomy - Lumbar Two-Lumbar Three - Lumbar Three-Lumbar Four;  Surgeon: JEustace Moore MD;  [place] [facility]  Service: Neurosurgery;  Laterality: N/A;  Laminectomy and Foraminotomy - Lumbar Two-Lumbar Three - Lumbar Three-Lumbar Four   ROTATOR CUFF REPAIR Left 05/24/2020   TENDON RECONSTRUCTION Right 10/27/2013   Procedure: RIGHT OPEN TRICEPS REPAIR;  Surgeon: TRenette Butters MD;  [place] [facility]  Service: Orthopedics;  Laterality: Right;   Family History  Problem Relation Age of Onset   Colon cancer Mother 957  Hypertension Mother    Colon cancer Father 751  Heart attack Brother  Heart failure Brother    Stomach cancer Neg Hx     Social History   Tobacco Use   Smoking status: Never   Smokeless tobacco: Never  Substance Use Topics   Alcohol use: No   ROS  Review of Systems  Cardiovascular:  Positive for dyspnea on exertion and syncope. Negative for chest pain and leg swelling.  Musculoskeletal:  Positive for back pain. Negative for falls and joint pain.  Gastrointestinal:  Positive for constipation. Negative for melena.  Neurological:  Positive for dizziness.   Objective  Blood pressure 140/76, pulse (!) 55, temperature 98.2 F (36.8 C), temperature source Temporal, resp. rate 16, height 5' 7"  (1.702 m), weight 183 lb (83 kg), SpO2 100 %.      09/05/2021   10:34 AM 09/05/2021    9:55 AM 04/16/2021    2:48 PM  Vitals with BMI  Height  5' 7"  5' 7"   Weight  183 lbs 183 lbs 10 oz  BMI  57.26 20.35  Systolic 597 416 384  Diastolic 76 72 73  Pulse  55 63    Orthostatic VS for the past 72 hrs (Last 3 readings):  Patient Position BP Location Cuff Size  09/05/21 0955 Sitting Left Arm Normal     Physical Exam Neck:     Vascular: No carotid bruit or JVD.  Cardiovascular:     Rate and Rhythm: Normal rate and regular rhythm.     Pulses: Intact distal pulses.     Heart sounds: Murmur heard.     Harsh midsystolic murmur is present with a grade of 2/6 at the upper right sternal border radiating to the neck.     No gallop.  Pulmonary:     Effort: Pulmonary effort is normal.     Breath sounds: Normal breath sounds.  Abdominal:     General: Bowel sounds are normal.     Palpations: Abdomen is soft.  Musculoskeletal:     Right lower leg: No edema.     Left lower leg: No edema.  Skin:    Capillary Refill: Capillary refill takes less than 2 seconds.    Laboratory examination:   No results for input(s): "NA", "K", "CL", "CO2", "GLUCOSE", "BUN", "CREATININE", "CALCIUM", "GFRNONAA", "GFRAA" in the last 8760 hours.  CrCl cannot be calculated (Patient's most recent lab result is older than the maximum 21 days allowed.).     Latest Ref Rng & Units 07/11/2019   10:40 AM 08/30/2018   10:35 AM 03/17/2016    9:41 AM  CMP  Glucose 70 - 99 mg/dL 108  112  117   BUN 8 - 23 mg/dL 22   15   Creatinine 0.61 - 1.24 mg/dL 1.26   1.13   Sodium 135 - 145 mmol/L 135  137  133   Potassium 3.5 - 5.1 mmol/L 4.5  4.7  3.8   Chloride 98 - 111 mmol/L 102   103   CO2 22 - 32 mmol/L 27   25   Calcium 8.9 - 10.3 mg/dL 9.4   8.2   Total Protein 6.5 - 8.1 g/dL   6.0   Total Bilirubin 0.3 - 1.2 mg/dL   1.4   Alkaline Phos 38 - 126 U/L   40   AST 15 - 41 U/L   22   ALT 17 - 63 U/L   17       Latest Ref Rng & Units 07/11/2019   10:40 AM 08/30/2018   10:35 AM  03/16/2016   11:04 AM  CBC  WBC 4.0 - 10.5 K/uL 5.8   7.3   Hemoglobin 13.0 - 17.0 g/dL 12.7  13.9  15.6   Hematocrit 39.0 - 52.0 % 38.2  41.0  44.0   Platelets 150 - 400 K/uL 214   181    External labs:   Labs 05/01/2020:  Serum glucose 102 mg, BUN 21, creatinine 1.0, EGFR 72 mL, potassium 4.4.  CMP normal.  WBC 6.89, Hb 14.0/HCT 40.7, platelets 214.  Normal indicis.  Total cholesterol 102, triglycerides 105, HDL 39, LDL 52.  Non-HDL cholesterol 83.  Testosterone levels normal.  PSA normal.  Cholesterol, total 120.000 m 04/12/2019 HDL 42 MG/DL 04/12/2019 LDL 36.000 mg 04/12/2019 Triglycerides 209.000 04/12/2019  A1C 6.100 % 04/12/2019  Hemoglobin 13.400 g/ 04/12/2019 Creatinine, Serum 1.200 mg/ 04/12/2019  Medications and allergies   Allergies  Allergen Reactions   Codeine Nausea And Vomiting   Guaifenesin & Derivatives Nausea And Vomiting   Terbinafine And Related Other (See Comments)    Muscle pain   Penicillins Rash    Tolerates Cefazolin without problems...[employer]    Current Outpatient Medications:    acetaminophen (TYLENOL) 500 MG tablet, Take 1,000 mg by mouth every morning., Disp: , Rfl:    aspirin 325 MG tablet, Take 325 mg by mouth every evening. , Disp: , Rfl:    Calcium Carbonate Antacid (TUMS ULTRA PO), Take 2 tablets by mouth daily as needed (acid). , Disp: , Rfl:    Coenzyme Q10 (COQ10) 100 MG CAPS, Take 100 mg by mouth 2 (two) times daily., Disp: , Rfl:    ezetimibe (ZETIA) 10 MG tablet, TAKE 1/2 TABLET BY MOUTH EVERY EVENING, Disp: 45 tablet, Rfl: 11   fish oil-omega-3 fatty acids 1000 MG capsule, Take 1 g by mouth 2 (two) times daily., Disp: , Rfl:    hydrALAZINE (APRESOLINE) 25 MG tablet, Take 1 tablet (25 mg total) by mouth 3 (three) times daily as needed (For SBP >140 mm Hg)., Disp: 270 tablet, Rfl: 3   losartan (COZAAR) 25 MG tablet, TAKE 1/2 TABLET BY MOUTH EVERY EVENING, Disp: 90 tablet, Rfl: 3   Multiple Vitamin (MULTIVITAMIN) tablet, Take 1 tablet by mouth  daily., Disp: , Rfl:    Multiple Vitamins-Minerals (PRESERVISION AREDS 2) CAPS, Take 1 capsule by mouth 2 (two) times daily., Disp: , Rfl:    pantoprazole (PROTONIX) 40 MG tablet, Take 1 tablet (40 mg total) by mouth daily. (Patient taking differently: Take 40 mg by mouth every morning.), Disp: 30 tablet, Rfl: 1   Polyethyl Glycol-Propyl Glycol (SYSTANE) 0.4-0.3 % GEL ophthalmic gel, Place 1 application into both eyes at bedtime., Disp: , Rfl:    Propylene Glycol (SYSTANE BALANCE) 0.6 % SOLN, Place 1 drop into both eyes daily as needed (dry eyes)., Disp: , Rfl:    Psyllium (METAMUCIL) 28.3 % POWD, Take by mouth as needed., Disp: , Rfl:    pyridoxine (B-6) 100 MG tablet, Take 100 mg by mouth daily., Disp: , Rfl:    ranolazine (RANEXA) 1000 MG SR tablet, TAKE ONE TABLET BY MOUTH TWICE DAILY, Disp: 180 tablet, Rfl: 3   rosuvastatin (CRESTOR) 20 MG tablet, TAKE ONE TABLET BY MOUTH EVERYDAY AT BEDTIME, Disp: 90 tablet, Rfl: 3   vitamin B-12 (CYANOCOBALAMIN) 1000 MCG tablet, Take 1 tablet by mouth daily., Disp: , Rfl:    vitamin C (ASCORBIC ACID) 500 MG tablet, Take 500 mg by mouth daily., Disp: , Rfl:    metoprolol succinate (TOPROL-XL) 25 MG  24 hr tablet, Take 0.5 tablets (12.5 mg total) by mouth every evening. TAKE 1 TABLET (25 MG TOTAL) BY MOUTH DAILY AFTER SUPPER. TAKE WITH OR IMMEDIATELY FOLLOWING A MEAL., Disp: 90 tablet, Rfl: 3   nitroGLYCERIN (NITROSTAT) 0.4 MG SL tablet, TAKE 1 TABLET EVERY 5 MINUTES AS NEEDED FOR CHEST PAIN., Disp: 25 tablet, Rfl: 3    Radiology:   No results found.  Cardiac Studies:    Cardiac cath 06/2008: LIMA to LAD, Skip SVG to OM1 + D1 with ostial 4x15 non DES, has residual stenosis in the OM 1 limb of the skip graft. SVG to PD, Xience  Stent RCA 3x18, 3.5x15. CABG 1996.  Treadmill stress test 08/10/2015: 1. Resting EKG, normal sinus rhythm, incomplete right bundle branch block.  Stress EKG was nondiagnostic for ischemia due to inability to achieve target heart rate  73% MPHR).  Patient exercised on Bruce protocol for 8 minutes and achieved 10.16 mets.  There was no ST-T changes at peak exercise. Stress terminated due to fatigue.  Stress converted to [facility] pharmacologic stress.  There was no additional ST-T wave changes with from plastic stress protocol. Stress symptoms included dyspnea. 2. The perfusion imaging study demonstrates a very small sized very mild ischemia in the inferior wall.  Left ventricular systolic function calculated QGS was normal at 70%. This is a low risk study.  Carotid artery duplex 08/17/2014: No hemodynamically significant arterial disease in the internal carotid artery bilaterally. Mild soft plaque noted.   Zio Patch Extended out patient EKG monitoring 14days starting 06/19/2020: Predominant rhythm is normal sinus rhythm.  Minimum heart rate 48, maximumheart rate 168 bpm with average heart rate of 62 bpm. There were 4 atrial tachycardia episodes, longest lasted 15 beats with averageheart rate of 140 bpm. There are rare PACs, PVCs, ventricular trigeminy. There was no atrial fibrillation, no high degree AV block. No symptoms reported.  Echocardiogram 07/31/2020: Left ventricle cavity is normal in size and wall thickness. Normal global wall motion. Normal LV systolic function with EF 58%. Doppler evidence of grade I(impaired) diastolic dysfunction, normal LAP.  Aortic valve leaflets number difficult to ascertain. Probably has calcifiedimmobile left coronary cusp.  Mild aortic valve stenosis. Vmax 2.0 m/sec, mean PG 9 mmHg, AVA 1.8 cm2 bycontinuity equation. Trace tricuspid regurgitation. Estimated pulmonary artery systolic pressure [id number]. No significant change compared to previous study on 08/12/2019.  Remote loop recorder transmission 08/10/2021: Predominant rhythm is normal sinus rhythm.  No symptoms reported.  No heart block, no atrial fibrillation.  EKG  EKG 09/05/2021: Normal sinus rhythm at rate of 52 bpm, left atrial  enlargement, left axis deviation, left anterior fascicular block.  IVCD, borderline criteria for LVH.  No significant change from 03/07/2021.  Assessment     ICD-10-CM   1. Coronary artery disease of native artery of native heart with stable angina pectoris (HCC)  I25.118 EKG 12-Lead    PCV MYOCARDIAL PERFUSION WITH LEXISCAN    metoprolol succinate (TOPROL-XL) 25 MG 24 hr tablet    nitroGLYCERIN (NITROSTAT) 0.4 MG SL tablet    2. Dyspnea on exertion  R06.09 PCV MYOCARDIAL PERFUSION WITH LEXISCAN    3. Primary hypertension  I10 metoprolol succinate (TOPROL-XL) 25 MG 24 hr tablet    4. Syncope and collapse  R55     5. Loop Biotronik Biomonitor III for syncope and collapse 04/09/2021  Z95.818       Meds ordered this encounter  Medications   nitroGLYCERIN (NITROSTAT) 0.4 MG SL tablet    Sig:  TAKE 1 TABLET EVERY 5 MINUTES AS NEEDED FOR CHEST PAIN.    Dispense:  25 tablet    Refill:  3   Medications Discontinued During This Encounter  Medication Reason   omega-3 acid ethyl esters (LOVAZA) 1 g capsule    nitroGLYCERIN (NITROSTAT) 0.4 MG SL tablet Reorder   metoprolol succinate (TOPROL-XL) 25 MG 24 hr tablet    Orders Placed This Encounter  Procedures   PCV MYOCARDIAL PERFUSION WITH LEXISCAN    Standing Status:   Future    Standing Expiration Date:   11/05/2021   EKG 12-Lead     Recommendations:   [name]  is a 76 y.o. Caucasian male patient with coronary artery disease and chronic stable angina, mild aortic stenosis, hypertension, mixed hyperlipidemia, GERD, chronic back pain, DJD.  Since last office visit he has not had any further dizziness or falls, no syncope.  Patient is here on a 87-monthoffice visit, he has developed severe arthritis involving bilateral hips, he is now scheduled for hip arthroplasty.  I had already sent in preoperative cardiac risk letter to his surgeon however he is also noticing shortness of breath which I suspect is probably due to  deconditioning.  However he has not had any cardiac testing in >5 years since his CABG.  I will schedule him for Lexiscan nuclear stress test.  He has not had any more episodes of syncope.  Loop recorder implantation essentially reveals normal sinus rhythm so far.  He has had some labile hypertension, he will continue to take hydralazine for elevated blood pressure >140/80 mmHg on a as needed basis.  He has noticed low blood pressure in the morning, hence will change metoprolol succinate from evening dose to morning dose and he will continue with losartan in the evening.  Lipids being managed by PCP, under excellent control.  I will see him back in 6 months or sooner if problems.  I will see him back sooner if nuclear stress test is abnormal.    JAdrian Prows MD, [facility]6/29/2023, 10:48 AM Office: [phone]: [phone]

## 2021-09-06 DIAGNOSIS — K219 Gastro-esophageal reflux disease without esophagitis: Secondary | ICD-10-CM | POA: Diagnosis not present

## 2021-09-06 DIAGNOSIS — N1831 Chronic kidney disease, stage 3a: Secondary | ICD-10-CM | POA: Diagnosis not present

## 2021-09-06 DIAGNOSIS — I1310 Hypertensive heart and chronic kidney disease without heart failure, with stage 1 through stage 4 chronic kidney disease, or unspecified chronic kidney disease: Secondary | ICD-10-CM | POA: Diagnosis not present

## 2021-09-06 DIAGNOSIS — E7800 Pure hypercholesterolemia, unspecified: Secondary | ICD-10-CM | POA: Diagnosis not present

## 2021-09-09 DIAGNOSIS — Q72891 Other reduction defects of right lower limb: Secondary | ICD-10-CM | POA: Diagnosis not present

## 2021-09-09 DIAGNOSIS — M50323 Other cervical disc degeneration at C6-C7 level: Secondary | ICD-10-CM | POA: Diagnosis not present

## 2021-09-09 DIAGNOSIS — M5136 Other intervertebral disc degeneration, lumbar region: Secondary | ICD-10-CM | POA: Diagnosis not present

## 2021-09-09 DIAGNOSIS — M9903 Segmental and somatic dysfunction of lumbar region: Secondary | ICD-10-CM | POA: Diagnosis not present

## 2021-09-09 DIAGNOSIS — M5137 Other intervertebral disc degeneration, lumbosacral region: Secondary | ICD-10-CM | POA: Diagnosis not present

## 2021-09-09 DIAGNOSIS — M4312 Spondylolisthesis, cervical region: Secondary | ICD-10-CM | POA: Diagnosis not present

## 2021-09-09 DIAGNOSIS — M9904 Segmental and somatic dysfunction of sacral region: Secondary | ICD-10-CM | POA: Diagnosis not present

## 2021-09-09 DIAGNOSIS — M9901 Segmental and somatic dysfunction of cervical region: Secondary | ICD-10-CM | POA: Diagnosis not present

## 2021-09-09 DIAGNOSIS — M5031 Other cervical disc degeneration,  high cervical region: Secondary | ICD-10-CM | POA: Diagnosis not present

## 2021-09-09 DIAGNOSIS — M5135 Other intervertebral disc degeneration, thoracolumbar region: Secondary | ICD-10-CM | POA: Diagnosis not present

## 2021-09-09 DIAGNOSIS — M9905 Segmental and somatic dysfunction of pelvic region: Secondary | ICD-10-CM | POA: Diagnosis not present

## 2021-09-09 DIAGNOSIS — M50322 Other cervical disc degeneration at C5-C6 level: Secondary | ICD-10-CM | POA: Diagnosis not present

## 2021-09-10 DIAGNOSIS — R55 Syncope and collapse: Secondary | ICD-10-CM | POA: Diagnosis not present

## 2021-09-10 DIAGNOSIS — Z95818 Presence of other cardiac implants and grafts: Secondary | ICD-10-CM | POA: Diagnosis not present

## 2021-09-10 DIAGNOSIS — Z4509 Encounter for adjustment and management of other cardiac device: Secondary | ICD-10-CM | POA: Diagnosis not present

## 2021-09-11 ENCOUNTER — Emergency Department (HOSPITAL_COMMUNITY): Payer: PPO

## 2021-09-11 ENCOUNTER — Other Ambulatory Visit: Payer: Self-pay

## 2021-09-11 ENCOUNTER — Encounter (HOSPITAL_COMMUNITY): Payer: Self-pay

## 2021-09-11 ENCOUNTER — Emergency Department (HOSPITAL_COMMUNITY)
Admission: EM | Admit: 2021-09-11 | Discharge: 2021-09-11 | Disposition: A | Discharge status: Home / Self Care | Payer: PPO | Attending: Emergency Medicine | Admitting: Emergency Medicine

## 2021-09-11 DIAGNOSIS — M25522 Pain in left elbow: Secondary | ICD-10-CM | POA: Diagnosis not present

## 2021-09-11 DIAGNOSIS — Z7982 Long term (current) use of aspirin: Secondary | ICD-10-CM | POA: Diagnosis not present

## 2021-09-11 DIAGNOSIS — R0789 Other chest pain: Secondary | ICD-10-CM | POA: Diagnosis not present

## 2021-09-11 DIAGNOSIS — S0990XA Unspecified injury of head, initial encounter: Secondary | ICD-10-CM | POA: Diagnosis present

## 2021-09-11 DIAGNOSIS — I2510 Atherosclerotic heart disease of native coronary artery without angina pectoris: Secondary | ICD-10-CM | POA: Diagnosis not present

## 2021-09-11 DIAGNOSIS — N2882 Megaloureter: Secondary | ICD-10-CM | POA: Diagnosis not present

## 2021-09-11 DIAGNOSIS — Z951 Presence of aortocoronary bypass graft: Secondary | ICD-10-CM | POA: Diagnosis not present

## 2021-09-11 DIAGNOSIS — S299XXA Unspecified injury of thorax, initial encounter: Secondary | ICD-10-CM | POA: Diagnosis not present

## 2021-09-11 DIAGNOSIS — R609 Edema, unspecified: Secondary | ICD-10-CM | POA: Diagnosis not present

## 2021-09-11 DIAGNOSIS — Z85828 Personal history of other malignant neoplasm of skin: Secondary | ICD-10-CM | POA: Diagnosis not present

## 2021-09-11 DIAGNOSIS — M47812 Spondylosis without myelopathy or radiculopathy, cervical region: Secondary | ICD-10-CM | POA: Diagnosis not present

## 2021-09-11 DIAGNOSIS — S51011A Laceration without foreign body of right elbow, initial encounter: Secondary | ICD-10-CM | POA: Diagnosis not present

## 2021-09-11 DIAGNOSIS — S3993XA Unspecified injury of pelvis, initial encounter: Secondary | ICD-10-CM | POA: Diagnosis not present

## 2021-09-11 DIAGNOSIS — S060XAA Concussion with loss of consciousness status unknown, initial encounter: Secondary | ICD-10-CM | POA: Insufficient documentation

## 2021-09-11 DIAGNOSIS — I10 Essential (primary) hypertension: Secondary | ICD-10-CM | POA: Diagnosis not present

## 2021-09-11 DIAGNOSIS — R58 Hemorrhage, not elsewhere classified: Secondary | ICD-10-CM | POA: Diagnosis not present

## 2021-09-11 DIAGNOSIS — Y92410 Unspecified street and highway as the place of occurrence of the external cause: Secondary | ICD-10-CM | POA: Diagnosis not present

## 2021-09-11 DIAGNOSIS — R109 Unspecified abdominal pain: Secondary | ICD-10-CM | POA: Insufficient documentation

## 2021-09-11 DIAGNOSIS — Z20822 Contact with and (suspected) exposure to covid-19: Secondary | ICD-10-CM | POA: Diagnosis not present

## 2021-09-11 DIAGNOSIS — S20212A Contusion of left front wall of thorax, initial encounter: Secondary | ICD-10-CM | POA: Diagnosis not present

## 2021-09-11 DIAGNOSIS — S51012A Laceration without foreign body of left elbow, initial encounter: Secondary | ICD-10-CM | POA: Insufficient documentation

## 2021-09-11 DIAGNOSIS — S060X0A Concussion without loss of consciousness, initial encounter: Secondary | ICD-10-CM | POA: Diagnosis not present

## 2021-09-11 DIAGNOSIS — S161XXA Strain of muscle, fascia and tendon at neck level, initial encounter: Secondary | ICD-10-CM | POA: Insufficient documentation

## 2021-09-11 DIAGNOSIS — Z955 Presence of coronary angioplasty implant and graft: Secondary | ICD-10-CM | POA: Diagnosis not present

## 2021-09-11 DIAGNOSIS — Z041 Encounter for examination and observation following transport accident: Secondary | ICD-10-CM | POA: Diagnosis not present

## 2021-09-11 DIAGNOSIS — I6523 Occlusion and stenosis of bilateral carotid arteries: Secondary | ICD-10-CM | POA: Diagnosis not present

## 2021-09-11 DIAGNOSIS — Z79899 Other long term (current) drug therapy: Secondary | ICD-10-CM | POA: Diagnosis not present

## 2021-09-11 DIAGNOSIS — R0602 Shortness of breath: Secondary | ICD-10-CM | POA: Insufficient documentation

## 2021-09-11 DIAGNOSIS — S1093XA Contusion of unspecified part of neck, initial encounter: Secondary | ICD-10-CM | POA: Diagnosis not present

## 2021-09-11 DIAGNOSIS — R079 Chest pain, unspecified: Secondary | ICD-10-CM | POA: Diagnosis not present

## 2021-09-11 DIAGNOSIS — S3991XA Unspecified injury of abdomen, initial encounter: Secondary | ICD-10-CM | POA: Diagnosis not present

## 2021-09-11 DIAGNOSIS — S199XXA Unspecified injury of neck, initial encounter: Secondary | ICD-10-CM | POA: Diagnosis not present

## 2021-09-11 DIAGNOSIS — M47816 Spondylosis without myelopathy or radiculopathy, lumbar region: Secondary | ICD-10-CM | POA: Diagnosis not present

## 2021-09-11 LAB — COMPREHENSIVE METABOLIC PANEL
ALT: 23 U/L (ref 0–44)
AST: 27 U/L (ref 15–41)
Albumin: 3.8 g/dL (ref 3.5–5.0)
Alkaline Phosphatase: 63 U/L (ref 38–126)
Anion gap: 10 (ref 5–15)
BUN: 25 mg/dL — ABNORMAL HIGH (ref 8–23)
CO2: 25 mmol/L (ref 22–32)
Calcium: 9.4 mg/dL (ref 8.9–10.3)
Chloride: 100 mmol/L (ref 98–111)
Creatinine, Ser: 1.42 mg/dL — ABNORMAL HIGH (ref 0.61–1.24)
GFR, Estimated: 51 mL/min — ABNORMAL LOW (ref >60)
Glucose, Bld: 159 mg/dL — ABNORMAL HIGH (ref 70–99)
Potassium: 3.8 mmol/L (ref 3.5–5.1)
Sodium: 135 mmol/L (ref 135–145)
Total Bilirubin: 1.3 mg/dL — ABNORMAL HIGH (ref 0.3–1.2)
Total Protein: 6.6 g/dL (ref 6.5–8.1)

## 2021-09-11 LAB — URINALYSIS, ROUTINE W REFLEX MICROSCOPIC
Bilirubin Urine: NEGATIVE
Glucose, UA: 50 mg/dL — AB
Hgb urine dipstick: NEGATIVE
Ketones, ur: NEGATIVE mg/dL
Leukocytes,Ua: NEGATIVE
Nitrite: NEGATIVE
Protein, ur: NEGATIVE mg/dL
Specific Gravity, Urine: 1.045 — ABNORMAL HIGH (ref 1.005–1.030)
pH: 5 (ref 5.0–8.0)

## 2021-09-11 LAB — I-STAT CHEM 8, ED
BUN: 27 mg/dL — ABNORMAL HIGH (ref 8–23)
Calcium, Ion: 1.16 mmol/L (ref 1.15–1.40)
Chloride: 99 mmol/L (ref 98–111)
Creatinine, Ser: 1.4 mg/dL — ABNORMAL HIGH (ref 0.61–1.24)
Glucose, Bld: 161 mg/dL — ABNORMAL HIGH (ref 70–99)
HCT: 35 % — ABNORMAL LOW (ref 39.0–52.0)
Hemoglobin: 11.9 g/dL — ABNORMAL LOW (ref 13.0–17.0)
Potassium: 3.9 mmol/L (ref 3.5–5.1)
Sodium: 135 mmol/L (ref 135–145)
TCO2: 24 mmol/L (ref 22–32)

## 2021-09-11 LAB — LACTIC ACID, PLASMA: Lactic Acid, Venous: 1.9 mmol/L (ref 0.5–1.9)

## 2021-09-11 LAB — CBC
HCT: 35.4 % — ABNORMAL LOW (ref 39.0–52.0)
Hemoglobin: 11.6 g/dL — ABNORMAL LOW (ref 13.0–17.0)
MCH: 33 pg (ref 26.0–34.0)
MCHC: 32.8 g/dL (ref 30.0–36.0)
MCV: 100.6 fL — ABNORMAL HIGH (ref 80.0–100.0)
Platelets: 218 10*3/uL (ref 150–400)
RBC: 3.52 MIL/uL — ABNORMAL LOW (ref 4.22–5.81)
RDW: 12.6 % (ref 11.5–15.5)
WBC: 7.1 10*3/uL (ref 4.0–10.5)
nRBC: 0 % (ref 0.0–0.2)

## 2021-09-11 LAB — ETHANOL: Alcohol, Ethyl (B): <10 mg/dL (ref <10)

## 2021-09-11 LAB — RESP PANEL BY RT-PCR (FLU A&B, COVID) ARPGX2
Influenza A by PCR: NEGATIVE
Influenza B by PCR: NEGATIVE
SARS Coronavirus 2 by RT PCR: NEGATIVE

## 2021-09-11 LAB — PROTIME-INR
INR: 1 (ref 0.8–1.2)
Prothrombin Time: 13.2 seconds (ref 11.4–15.2)

## 2021-09-11 MED ORDER — IOHEXOL 300 MG/ML  SOLN
100.0000 mL | Freq: Once | INTRAMUSCULAR | Status: AC | PRN
  Administered 2021-09-11: 100 mL via INTRAVENOUS

## 2021-09-11 MED ORDER — METHOCARBAMOL 500 MG PO TABS: 500.0000 mg | ORAL_TABLET | Freq: Two times a day (BID) | ORAL | 0 refills | Status: DC

## 2021-09-11 MED ORDER — FENTANYL CITRATE PF 50 MCG/ML IJ SOSY
25.0000 ug | PREFILLED_SYRINGE | Freq: Once | INTRAMUSCULAR | Status: AC
  Administered 2021-09-11: 25 ug via INTRAVENOUS
  Filled 2021-09-11: qty 1

## 2021-09-11 MED ORDER — ONDANSETRON HCL 4 MG/2ML IJ SOLN
4.0000 mg | Freq: Once | INTRAMUSCULAR | Status: AC
  Administered 2021-09-11: 4 mg via INTRAVENOUS
  Filled 2021-09-11: qty 2
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
## 2021-09-11 NOTE — ED Notes (Signed)
X-ray at bedside

## 2021-09-11 NOTE — ED Notes (Signed)
Returned from CT.

## 2021-09-11 NOTE — ED Notes (Signed)
MD at bedside. 
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
## 2021-09-23 ENCOUNTER — Ambulatory Visit (INDEPENDENT_AMBULATORY_CARE_PROVIDER_SITE_OTHER): Payer: PPO | Admitting: Family Medicine

## 2021-09-23 VITALS — BP 130/80 | HR 58 | Ht 67.0 in | Wt 180.4 lb

## 2021-09-23 DIAGNOSIS — S060X0A Concussion without loss of consciousness, initial encounter: Secondary | ICD-10-CM | POA: Diagnosis not present

## 2021-09-23 DIAGNOSIS — M542 Cervicalgia: Secondary | ICD-10-CM | POA: Diagnosis not present

## 2021-09-23 DIAGNOSIS — G4489 Other headache syndrome: Secondary | ICD-10-CM

## 2021-09-23 MED ORDER — NORTRIPTYLINE HCL 25 MG PO CAPS: 25.0000 mg | ORAL_CAPSULE | Freq: Every day | ORAL | 2 refills | Status: DC
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
## 2021-09-23 NOTE — Patient Instructions (Addendum)
Thank you for coming in today.   I've sent a prescription for nortriptyline to your pharmacy.   I've referred you to Physical Therapy.  Let us know if you don't hear from them in one week.   Try using a heating pad on your neck  Check back in 2 weeks

## 2021-09-30 ENCOUNTER — Ambulatory Visit: Payer: PPO

## 2021-09-30 DIAGNOSIS — R0609 Other forms of dyspnea: Secondary | ICD-10-CM

## 2021-09-30 DIAGNOSIS — I25118 Atherosclerotic heart disease of native coronary artery with other forms of angina pectoris: Secondary | ICD-10-CM | POA: Diagnosis not present
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
## 2021-10-07 ENCOUNTER — Ambulatory Visit (INDEPENDENT_AMBULATORY_CARE_PROVIDER_SITE_OTHER): Payer: PPO | Admitting: Family Medicine

## 2021-10-07 VITALS — BP 172/88 | HR 52 | Ht 67.0 in | Wt 182.8 lb

## 2021-10-07 DIAGNOSIS — M542 Cervicalgia: Secondary | ICD-10-CM | POA: Diagnosis not present

## 2021-10-07 DIAGNOSIS — G4489 Other headache syndrome: Secondary | ICD-10-CM | POA: Diagnosis not present

## 2021-10-07 DIAGNOSIS — S060X0D Concussion without loss of consciousness, subsequent encounter: Secondary | ICD-10-CM | POA: Diagnosis not present

## 2021-10-07 DIAGNOSIS — H9193 Unspecified hearing loss, bilateral: Secondary | ICD-10-CM | POA: Diagnosis not present

## 2021-10-07 NOTE — Patient Instructions (Addendum)
Thank you for coming in today.   Continue PT.   See your audiologist.   Get them to send me a fax with the report.   Recheck in 1 month.   Let me know if you want to start cognitive rehab.

## 2021-10-08 ENCOUNTER — Encounter: Payer: Self-pay | Admitting: Physical Therapy

## 2021-10-08 ENCOUNTER — Ambulatory Visit: Payer: PPO | Attending: Family Medicine | Admitting: Physical Therapy

## 2021-10-08 DIAGNOSIS — G4486 Cervicogenic headache: Secondary | ICD-10-CM | POA: Insufficient documentation

## 2021-10-08 DIAGNOSIS — R252 Cramp and spasm: Secondary | ICD-10-CM | POA: Diagnosis not present

## 2021-10-08 DIAGNOSIS — R41841 Cognitive communication deficit: Secondary | ICD-10-CM | POA: Insufficient documentation

## 2021-10-08 DIAGNOSIS — M542 Cervicalgia: Secondary | ICD-10-CM | POA: Diagnosis not present
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
## 2021-10-10 ENCOUNTER — Ambulatory Visit: Payer: PPO

## 2021-10-10 ENCOUNTER — Telehealth: Payer: Self-pay | Admitting: Family Medicine

## 2021-10-10 DIAGNOSIS — G4486 Cervicogenic headache: Secondary | ICD-10-CM

## 2021-10-10 DIAGNOSIS — M542 Cervicalgia: Secondary | ICD-10-CM

## 2021-10-10 DIAGNOSIS — S060X0D Concussion without loss of consciousness, subsequent encounter: Secondary | ICD-10-CM

## 2021-10-10 DIAGNOSIS — R252 Cramp and spasm: Secondary | ICD-10-CM
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
## 2021-10-10 NOTE — Telephone Encounter (Signed)
The physical therapist contacted me and after talking with you thinks that speech therapy would be beneficial for cognitive rehab.  I was thinking of doing that too.  I went ahead and put an order in so you can get started with speech therapy for cognitive rehab at the same location where you are doing physical therapy.

## 2021-10-11 DIAGNOSIS — Z95818 Presence of other cardiac implants and grafts: Secondary | ICD-10-CM | POA: Diagnosis not present

## 2021-10-11 DIAGNOSIS — Z4509 Encounter for adjustment and management of other cardiac device: Secondary | ICD-10-CM | POA: Diagnosis not present

## 2021-10-11 DIAGNOSIS — R55 Syncope and collapse: Secondary | ICD-10-CM | POA: Diagnosis not present

## 2021-10-15 ENCOUNTER — Other Ambulatory Visit: Payer: Self-pay | Admitting: Family Medicine

## 2021-10-15 NOTE — Progress Notes (Signed)
Lexiscan Tetrofosmin stress test 09/30/2021: Lexiscan nuclear stress test performed using 1-day protocol. Post Lexiscan injection, chest discomfort and nausea were reported.  SPECT images show small sized, mild intensity, partially reversible perfusion defect in basal inferior/inferolateral myocardium. Stress LVEF 59%. Low risk study. Compared to 08/10/2015, no change.

## 2021-10-15 NOTE — Addendum Note (Signed)
Addended by: Sumner Boast on: 10/15/2021 08:57 AM   Modules accepted: Orders

## 2021-10-15 NOTE — Telephone Encounter (Signed)
Rx refill request approved per Dr. Corey's orders. 

## 2021-10-16 ENCOUNTER — Encounter: Payer: Self-pay | Admitting: Physical Therapy

## 2021-10-16 ENCOUNTER — Ambulatory Visit: Payer: PPO | Admitting: Physical Therapy

## 2021-10-16 DIAGNOSIS — G4486 Cervicogenic headache: Secondary | ICD-10-CM

## 2021-10-16 DIAGNOSIS — M542 Cervicalgia: Secondary | ICD-10-CM

## 2021-10-16 DIAGNOSIS — R252 Cramp and spasm: Secondary | ICD-10-CM
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
## 2021-10-18 ENCOUNTER — Ambulatory Visit: Payer: PPO | Admitting: Speech Pathology

## 2021-10-18 ENCOUNTER — Ambulatory Visit: Payer: PPO

## 2021-10-18 DIAGNOSIS — M542 Cervicalgia: Secondary | ICD-10-CM | POA: Diagnosis not present

## 2021-10-18 DIAGNOSIS — G4486 Cervicogenic headache: Secondary | ICD-10-CM

## 2021-10-18 DIAGNOSIS — R41841 Cognitive communication deficit: Secondary | ICD-10-CM

## 2021-10-18 DIAGNOSIS — R252 Cramp and spasm: Secondary | ICD-10-CM

## 2021-10-18 NOTE — Therapy (Addendum)
OUTPATIENT SPEECH LANGUAGE PATHOLOGY EVALUATION   Patient Name: [name] MRN: [id number] DOB:April 28, 1945, 76 y.o., male Today's Date: 10/18/2021  PCP: Haywood Pao, MD REFERRING PROVIDER: Gregor Hams, MD    End of Session - 10/18/21 1027     Visit Number 1    Number of Visits 9    Date for SLP Re-Evaluation 12/18/21    SLP Start Time 1017    SLP Stop Time  1100    SLP Time Calculation (min) 43 min    Activity Tolerance Patient tolerated treatment well             Past Medical History:  Diagnosis Date   Anginal pain ([place])    Arthritis    Basal cell carcinoma    back of neck   Blood transfusion without reported diagnosis 2006   during open heart surgery   CAD (coronary artery disease)    Diverticulosis 2013   Noted on Colonoscopy   Dyspnea    with activity and walking, sometime has SOB walking to mailbox and back to the house   GERD (gastroesophageal reflux disease)    History of colon polyps 2008   Noted on Colonoscopy   History of esophageal stricture 2003   Noted on EGD   Hyperlipidemia    Hypertension    IVCD (intraventricular conduction defect) 05/2018   Noted on EKG   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   Noted on EKG   Left axis deviation 05/2018   Noted on EKG   Lipoma 04/20/2018   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   Loop Biotronik Biomonitor III for syncope and collapse 04/09/2021 04/09/2021   LVH (left ventricular hypertrophy) 05/2018   Noted on EKG   Phimosis    Pneumonia    Tuberculosis 1956   Wears glasses    Wheezing    Past Surgical History:  Procedure Laterality Date   ANGIOPLASTY  2010   3 stents placed   BACK SURGERY     CATARACT EXTRACTION W/ [place], BILATERAL  2012   bilateral   CIRCUMCISION N/A 08/30/2018   Procedure: CIRCUMCISION ADULT;  Surgeon: Kathie Rhodes, MD;  Location: [place];  Service: Urology;  Laterality: N/A;   COLONOSCOPY  2013, 2008    CORONARY ARTERY BYPASS GRAFT  2006   x4   ESOPHAGOGASTRODUODENOSCOPY  10/2011   EYE SURGERY     JOINT REPLACEMENT     KNEE ARTHROSCOPY  2001   LUMBAR DISC SURGERY  1998   LUMBAR LAMINECTOMY/DECOMPRESSION MICRODISCECTOMY N/A 07/13/2019   Procedure: Laminectomy and Foraminotomy - Lumbar Two-Lumbar Three - Lumbar Three-Lumbar Four;  Surgeon: Eustace Moore, MD;  Location: [place];  Service: Neurosurgery;  Laterality: N/A;  Laminectomy and Foraminotomy - Lumbar Two-Lumbar Three - Lumbar Three-Lumbar Four   ROTATOR CUFF REPAIR Left 05/24/2020   TENDON RECONSTRUCTION Right 10/27/2013   Procedure: RIGHT OPEN TRICEPS REPAIR;  Surgeon: Renette Butters, MD;  Location: [place];  Service: Orthopedics;  Laterality: Right;   Patient Active Problem List   Diagnosis Date Noted   Syncope and collapse 04/09/2021   Loop Biotronik Biomonitor III for syncope and collapse 04/09/2021 04/09/2021   S/P lumbar laminectomy 07/13/2019   Chest pain 03/16/2016   Constipation 03/16/2016   Hypertension    Hyperlipidemia    CAD (coronary artery disease)    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 Pt accompanied by: significant other  PERTINENT HISTORY: HTN, HLD, CAD s/p CABG, GERD, TB, arthritis, and skin cancer.   PAIN:  Are you having pain? Yes: NPRS scale: 3/10 Pain location: frontal Pain description: throbbing Aggravating factors: light Relieving factors: medication   FALLS: Has patient fallen in last 6 months?  Yes, 3; seeing PT  LIVING ENVIRONMENT: Lives with: lives with their spouse Lives in: House/apartment  PLOF:  Level of assistance:  Independent with ADLs, Independent with IADLs Employment: Retired   PATIENT GOALS memory  OBJECTIVE:   DIAGNOSTIC FINDINGS:   09/11/21 IMPRESSION: 1. No intracranial trauma. 2. No cervical spine fracture. 3. Subcutaneous bruising in the LEFT neck superior to the clavicle.     Electronically Signed   By: Suzy Bouchard M.D.   On: 09/11/2021 16:54  COGNITION: Overall cognitive status: Impaired Areas of impairment:  Attention: Impaired: Selective, Alternating, Divided Memory: Impaired: Working Industrial/product designer term Programme researcher, broadcasting/film/video function: Impaired: Organization, Planning, and Slow processing Functional deficits: Difficulty retaining information  COGNITIVE COMMUNICATION Following directions: Follows multi-step commands inconsistently  Auditory comprehension: WFL Verbal expression: Impaired: word finding Functional communication: Impaired: Wife reports difficulty recalling conversations. Pt demonstrated difficulty recalling directions   STANDARDIZED ASSESSMENTS: CLQT: Attention: WNL, Memory: Mild, Executive Function: WNL, Language: Moderate, Visuospatial Skills: WNL, and Clock Drawing: WNL   PATIENT REPORTED OUTCOME MEASURES (PROM): NA    PATIENT EDUCATION: Education details: Cog-comm Person educated: Patient and Spouse Education method: Customer service manager Education comprehension: verbalized understanding and needs further education     GOALS: Goals reviewed with patient? Yes  SHORT TERM GOALS: Target date: 11/15/2021    Pt will increase auditory comprehension by recalling and verbalizing strategies to assist with active listening during conversation with minA. Baseline: Goal status: INITIAL   2. Pt will comprehend functional memory or visual aids for recall of important information during structured conversations with minA. Baseline:  Goal status: INITIAL  LONG TERM GOALS: Target date: 12/13/2021    Pt will comprehend functional memory or visual aids for  recall of important information during unstructured conversations independently. Baseline:  Goal status: INITIAL  2. Pt will increase auditory comprehension by recalling and verbalizing strategies to assist with active listening during unstructured conversation independently. Baseline:  Goal status: INITIAL  ASSESSMENT:  CLINICAL IMPRESSION: Pt is a 75 yo male who presents to [facility] for evaluation post concussion. Pt endorses difficulties in memory, word finding, slower processing, and light sensitivity. Majority of session was conducted with lights dimmed to account for photophobia. Pt was unable to verbalize difficulties with thinking skills until SLP provided him with options. SLP asked if wife was available to come into session to assist with answering therapist questions. Wife reports that pt relies on her to answer for him. Wife also reports she has always been responsible for paying bills and medication management. Pt was assessed using CLQT, see above for details. SLP observed difficulties with retaining of information, recall of information, slow processing, and occasional anomia in conversation. SLP rec skilled ST services to address cognitive-communication impairment to maximize functional communication and independence.    OBJECTIVE IMPAIRMENTS include attention, memory, and executive functioning. These impairments are limiting patient from household responsibilities and effectively communicating at home and in community. Factors affecting potential to achieve goals and functional  outcome are  NA .Marland Kitchen Patient will benefit from skilled SLP services to address above impairments and improve overall function.  REHAB POTENTIAL: Good  PLAN: SLP FREQUENCY: 1x/week  SLP DURATION: 8 weeks  PLANNED INTERVENTIONS: Environmental controls, Cueing hierachy, Internal/external aids, Functional tasks, SLP instruction and feedback, Compensatory strategies, and Patient/family education    [place], CCC-SLP 10/18/2021, 10:27 AM

## 2021-10-22 ENCOUNTER — Encounter: Payer: Self-pay | Admitting: Physical Therapy

## 2021-10-22 ENCOUNTER — Ambulatory Visit: Payer: PPO | Admitting: Physical Therapy

## 2021-10-22 DIAGNOSIS — M542 Cervicalgia: Secondary | ICD-10-CM | POA: Diagnosis not present

## 2021-10-22 DIAGNOSIS — G4486 Cervicogenic headache: Secondary | ICD-10-CM

## 2021-10-22 DIAGNOSIS — R252 Cramp and spasm: Secondary | ICD-10-CM
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
## 2021-10-24 ENCOUNTER — Ambulatory Visit: Payer: PPO

## 2021-10-24 ENCOUNTER — Encounter: Payer: Self-pay | Admitting: Speech Pathology

## 2021-10-24 ENCOUNTER — Ambulatory Visit: Payer: PPO | Admitting: Speech Pathology

## 2021-10-24 DIAGNOSIS — G4486 Cervicogenic headache: Secondary | ICD-10-CM

## 2021-10-24 DIAGNOSIS — M542 Cervicalgia: Secondary | ICD-10-CM | POA: Diagnosis not present

## 2021-10-24 DIAGNOSIS — R41841 Cognitive communication deficit: Secondary | ICD-10-CM

## 2021-10-24 DIAGNOSIS — R252 Cramp and spasm: Secondary | ICD-10-CM

## 2021-10-25 NOTE — Therapy (Signed)
OUTPATIENT SPEECH LANGUAGE PATHOLOGY TREATMENT NOTE   Patient Name: [name] MRN: [id number] DOB:Nov 08, 1945, 76 y.o., male Today's Date: 10/25/2021  PCP: Haywood Pao, MD REFERRING PROVIDER: Gregor Hams, MD   END OF SESSION:   End of Session - 10/24/21 0933     Visit Number 2    Number of Visits 9    Date for SLP Re-Evaluation 12/18/21    SLP Start Time 0930    SLP Stop Time  1010    SLP Time Calculation (min) 40 min    Activity Tolerance Patient tolerated treatment well             Past Medical History:  Diagnosis Date   Anginal pain ([place])    Arthritis    Basal cell carcinoma    back of neck   Blood transfusion without reported diagnosis 2006   during open heart surgery   CAD (coronary artery disease)    Diverticulosis 2013   Noted on Colonoscopy   Dyspnea    with activity and walking, sometime has SOB walking to mailbox and back to the house   GERD (gastroesophageal reflux disease)    History of colon polyps 2008   Noted on Colonoscopy   History of esophageal stricture 2003   Noted on EGD   Hyperlipidemia    Hypertension    IVCD (intraventricular conduction defect) 05/2018   Noted on EKG   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   Noted on EKG   Left axis deviation 05/2018   Noted on EKG   Lipoma 04/20/2018   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   Loop Biotronik Biomonitor III for syncope and collapse 04/09/2021 04/09/2021   LVH (left ventricular hypertrophy) 05/2018   Noted on EKG   Phimosis    Pneumonia    Tuberculosis 1956   Wears glasses    Wheezing    Past Surgical History:  Procedure Laterality Date   ANGIOPLASTY  2010   3 stents placed   BACK SURGERY     CATARACT EXTRACTION W/ [place], BILATERAL  2012   bilateral   CIRCUMCISION N/A 08/30/2018   Procedure: CIRCUMCISION ADULT;  Surgeon: Kathie Rhodes, MD;  Location: [facility];  Service: Urology;  Laterality: N/A;   COLONOSCOPY   2013, 2008   CORONARY ARTERY BYPASS GRAFT  2006   x4   ESOPHAGOGASTRODUODENOSCOPY  10/2011   EYE SURGERY     JOINT REPLACEMENT     KNEE ARTHROSCOPY  2001   LUMBAR DISC SURGERY  1998   LUMBAR LAMINECTOMY/DECOMPRESSION MICRODISCECTOMY N/A 07/13/2019   Procedure: Laminectomy and Foraminotomy - Lumbar Two-Lumbar Three - Lumbar Three-Lumbar Four;  Surgeon: Eustace Moore, MD;  Location: [facility];  Service: Neurosurgery;  Laterality: N/A;  Laminectomy and Foraminotomy - Lumbar Two-Lumbar Three - Lumbar Three-Lumbar Four   ROTATOR CUFF REPAIR Left 05/24/2020   TENDON RECONSTRUCTION Right 10/27/2013   Procedure: RIGHT OPEN TRICEPS REPAIR;  Surgeon: Renette Butters, MD;  Location: [facility];  Service: Orthopedics;  Laterality: Right;   Patient Active Problem List   Diagnosis Date Noted   Syncope and collapse 04/09/2021   Loop Biotronik Biomonitor III for syncope and collapse 04/09/2021 04/09/2021   S/P lumbar laminectomy 07/13/2019   Chest pain 03/16/2016   Constipation 03/16/2016   Hypertension    Hyperlipidemia    CAD (coronary artery disease)    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 Yes NPRS scale: 4/10 Pain location: head Pain orientation: Other: frontal   PAIN TYPE: headache     OBJECTIVE:   TODAY'S TREATMENT:  10/24/21: Completed pt and caregiver education this session regarding cognitive communication and symptoms. For tx, SLP had pt read "what you might notice" and attempt to check off deficits. Pt had difficulty identifying his deficits/what he is experiencing. Wife was able to fill-in other information and provide examples. SLP edu on the  importance of being aware of deficits before we address them. Pt cont to demonstrate slow processing and difficulty with understanding information, as well as finding other words. Will discuss communication strategies next session.   PATIENT EDUCATION: Education details: Cog-comm Person educated: Patient and Spouse Education method: Customer service manager Education comprehension: verbalized understanding and needs further education         GOALS: Goals reviewed with patient? Yes   SHORT TERM GOALS: Target date: 11/15/2021     Pt will increase auditory comprehension by recalling and verbalizing strategies to assist with active listening during conversation with minA. Baseline: Goal status: INITIAL   2. Pt will comprehend functional memory or visual aids for recall of important information during structured conversations with minA. Baseline:  Goal status: INITIAL   LONG TERM GOALS: Target date: 12/13/2021     Pt will comprehend functional memory or visual aids for recall of important information during unstructured conversations independently. Baseline:  Goal status: INITIAL   2. Pt will increase auditory comprehension by recalling and verbalizing strategies to assist with active listening during unstructured conversation independently. Baseline:  Goal status: INITIAL   ASSESSMENT:   CLINICAL IMPRESSION: See tx note. Pt is a 76 yo male who presents to [place] for evaluation post concussion. Pt endorses difficulties in memory, word finding, slower processing, and light sensitivity. Majority of session was conducted with lights dimmed to account for photophobia. Pt was unable to verbalize difficulties with thinking skills until SLP provided him with options. SLP asked if wife was available to come into session to assist with answering therapist questions. Wife reports that pt relies on her to answer for him. Wife also reports she has always been responsible for paying bills and  medication management. Pt was assessed using CLQT, see above for details. SLP observed difficulties with retaining of information, recall of information, slow processing, and occasional anomia in conversation. SLP rec skilled ST services to address cognitive-communication impairment to maximize functional communication and independence. Cont with current POC.     OBJECTIVE IMPAIRMENTS include attention, memory, and executive functioning. These impairments are limiting patient from household responsibilities and effectively communicating at home and in community. Factors affecting potential to achieve goals and functional outcome are  NA .Marland Kitchen Patient will benefit from skilled SLP services to address above impairments and improve overall function.   REHAB POTENTIAL: Good   PLAN: SLP FREQUENCY: 1x/week   SLP DURATION: 8 weeks   PLANNED INTERVENTIONS: Environmental controls, Cueing hierachy, Internal/external aids, Functional tasks, SLP instruction and feedback, Compensatory strategies, and Patient/family education       [place], CCC-SLP 10/25/2021, 10:08 AM

## 2021-10-29 ENCOUNTER — Encounter: Payer: Self-pay | Admitting: Speech Pathology

## 2021-10-29 ENCOUNTER — Ambulatory Visit: Payer: PPO | Admitting: Physical Therapy

## 2021-10-29 ENCOUNTER — Ambulatory Visit: Payer: PPO | Admitting: Speech Pathology

## 2021-10-29 DIAGNOSIS — G4486 Cervicogenic headache: Secondary | ICD-10-CM

## 2021-10-29 DIAGNOSIS — M542 Cervicalgia: Secondary | ICD-10-CM

## 2021-10-29 DIAGNOSIS — R41841 Cognitive communication deficit: Secondary | ICD-10-CM

## 2021-10-29 DIAGNOSIS — R252 Cramp and spasm: Secondary | ICD-10-CM

## 2021-10-29 NOTE — Therapy (Signed)
OUTPATIENT SPEECH LANGUAGE PATHOLOGY TREATMENT NOTE   Patient Name: [name] MRN: [id number] DOB:1946/01/22, 76 y.o., male Today's Date: 10/29/2021  PCP: Haywood Pao, MD REFERRING PROVIDER: Gregor Hams, MD   END OF SESSION:   End of Session - 10/29/21 1007     Visit Number 3    Number of Visits 9    Date for SLP Re-Evaluation 12/18/21    SLP Start Time 1007    SLP Stop Time  6237    SLP Time Calculation (min) 40 min    Activity Tolerance Patient tolerated treatment well             Past Medical History:  Diagnosis Date   Anginal pain (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   Noted on EKG   Left axis deviation 05/2018   Noted on EKG   Lipoma 04/20/2018   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   Loop Biotronik Biomonitor III for syncope and collapse 04/09/2021 04/09/2021   LVH (left ventricular hypertrophy) 05/2018   Noted on EKG   Phimosis    Pneumonia    Tuberculosis 1956   Wears glasses    Wheezing    Past Surgical History:  Procedure Laterality Date   ANGIOPLASTY  2010   3 stents placed   BACK SURGERY     CATARACT EXTRACTION W/ [place], BILATERAL  2012   bilateral   CIRCUMCISION N/A 08/30/2018   Procedure: CIRCUMCISION ADULT;  Surgeon: Kathie Rhodes, MD;  Location: [facility];  Service: Urology;  Laterality: N/A;   COLONOSCOPY   2013, 2008   CORONARY ARTERY BYPASS GRAFT  2006   x4   ESOPHAGOGASTRODUODENOSCOPY  10/2011   EYE SURGERY     JOINT REPLACEMENT     KNEE ARTHROSCOPY  2001   LUMBAR DISC SURGERY  1998   LUMBAR LAMINECTOMY/DECOMPRESSION MICRODISCECTOMY N/A 07/13/2019   Procedure: Laminectomy and Foraminotomy - Lumbar Two-Lumbar Three - Lumbar Three-Lumbar Four;  Surgeon: Eustace Moore, MD;  Location: [facility];  Service: Neurosurgery;  Laterality: N/A;  Laminectomy and Foraminotomy - Lumbar Two-Lumbar Three - Lumbar Three-Lumbar Four   ROTATOR CUFF REPAIR Left 05/24/2020   TENDON RECONSTRUCTION Right 10/27/2013   Procedure: RIGHT OPEN TRICEPS REPAIR;  Surgeon: Renette Butters, MD;  Location: [facility];  Service: Orthopedics;  Laterality: Right;   Patient Active Problem List   Diagnosis Date Noted   Syncope and collapse 04/09/2021   Loop Biotronik Biomonitor III for syncope and collapse 04/09/2021 04/09/2021   S/P lumbar laminectomy 07/13/2019   Chest pain 03/16/2016   Constipation 03/16/2016   Hypertension    Hyperlipidemia    CAD (coronary artery disease)    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 No NPRS scale: 0/10 Pain location:  Pain orientation: PAIN TYPE:      OBJECTIVE:   TODAY'S TREATMENT:  10/28/21: Pt reports he is feeling much better - wife is in agreement. SLP observed faster processing today of verbally provided information. Pt was able to answer more questions to completion today and did not demonstrate any anomia.   Completed pt and caregiver education this session regarding communication strategies to decrease communication breakdowns. Pt and wife  reported they would trial the following re: reducing distractions, getting each others attention prior to conversations, introducing a new topic prior to changing.   To begin memory strategies next session and cont to monitor progress.   10/24/21: Completed pt and caregiver education this session regarding cognitive communication and symptoms. For tx, SLP had pt read "what you might notice" and attempt to check off deficits. Pt had difficulty identifying his deficits/what he is experiencing. Wife was able to fill-in other information and provide examples. SLP edu on the importance of being aware of deficits before we address them. Pt cont to demonstrate slow processing and difficulty with understanding information, as well as finding other words. Will discuss communication strategies next session.   PATIENT EDUCATION: Education details: Cog-comm Person educated: Patient and Spouse Education method: Customer service manager Education comprehension: verbalized understanding and needs further education         GOALS: Goals reviewed with patient? Yes   SHORT TERM GOALS: Target date: 11/15/2021     Pt will increase auditory comprehension by recalling and verbalizing strategies to assist with active listening during conversation with minA. Baseline: Goal status: INITIAL   2. Pt will comprehend functional memory or visual aids for recall of important information during structured conversations with minA. Baseline:  Goal status: INITIAL   LONG TERM GOALS: Target date: 12/13/2021     Pt will comprehend functional memory or visual aids for recall of important information during unstructured conversations independently. Baseline:  Goal status: INITIAL   2. Pt will increase auditory comprehension by recalling and verbalizing strategies to assist with active listening during unstructured conversation independently. Baseline:  Goal status: INITIAL   ASSESSMENT:   CLINICAL IMPRESSION: See  tx note. Pt is a 76 yo male who presents to [place] for evaluation post concussion. Pt endorses difficulties in memory, word finding, slower processing, and light sensitivity. Majority of session was conducted with lights dimmed to account for photophobia. Pt was unable to verbalize difficulties with thinking skills until SLP provided him with options. SLP asked if wife was available to come into session to assist with answering therapist questions. Wife reports that pt relies on her to answer for him. Wife also reports she has always been responsible for paying bills and medication management. Pt was assessed using CLQT, see above for details. SLP observed difficulties with retaining of information, recall of information, slow processing, and occasional anomia in conversation. SLP rec skilled ST services to address cognitive-communication impairment to maximize functional communication and independence. Cont with current POC.     OBJECTIVE IMPAIRMENTS include attention, memory, and executive functioning. These impairments are limiting patient from household responsibilities and effectively communicating at home and in community. Factors affecting potential to achieve goals and functional outcome are  NA .Marland Kitchen  Patient will benefit from skilled SLP services to address above impairments and improve overall function.   REHAB POTENTIAL: Good   PLAN: SLP FREQUENCY: 1x/week   SLP DURATION: 8 weeks   PLANNED INTERVENTIONS: Environmental controls, Cueing hierachy, Internal/external aids, Functional tasks, SLP instruction and feedback, Compensatory strategies, and Patient/family education       [place], CCC-SLP 10/29/2021, 10:09 AM
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
## 2021-10-31 ENCOUNTER — Ambulatory Visit: Payer: PPO | Admitting: Physical Therapy

## 2021-10-31 DIAGNOSIS — M542 Cervicalgia: Secondary | ICD-10-CM

## 2021-10-31 DIAGNOSIS — G4486 Cervicogenic headache: Secondary | ICD-10-CM
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
## 2021-11-05 ENCOUNTER — Encounter: Payer: Self-pay | Admitting: Speech Pathology

## 2021-11-05 ENCOUNTER — Ambulatory Visit: Payer: PPO

## 2021-11-05 ENCOUNTER — Ambulatory Visit: Payer: PPO | Admitting: Speech Pathology

## 2021-11-05 DIAGNOSIS — R252 Cramp and spasm: Secondary | ICD-10-CM

## 2021-11-05 DIAGNOSIS — M542 Cervicalgia: Secondary | ICD-10-CM

## 2021-11-05 DIAGNOSIS — G4486 Cervicogenic headache: Secondary | ICD-10-CM

## 2021-11-05 DIAGNOSIS — R41841 Cognitive communication deficit: Secondary | ICD-10-CM
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
## 2021-11-07 ENCOUNTER — Ambulatory Visit: Payer: PPO

## 2021-11-07 DIAGNOSIS — M542 Cervicalgia: Secondary | ICD-10-CM

## 2021-11-07 DIAGNOSIS — R252 Cramp and spasm: Secondary | ICD-10-CM

## 2021-11-07 DIAGNOSIS — G4486 Cervicogenic headache: Secondary | ICD-10-CM

## 2021-11-11 DIAGNOSIS — Z95818 Presence of other cardiac implants and grafts: Secondary | ICD-10-CM | POA: Diagnosis not present

## 2021-11-11 DIAGNOSIS — Z4509 Encounter for adjustment and management of other cardiac device: Secondary | ICD-10-CM | POA: Diagnosis not present

## 2021-11-11 DIAGNOSIS — R55 Syncope and collapse: Secondary | ICD-10-CM | POA: Diagnosis not present

## 2021-11-12 ENCOUNTER — Ambulatory Visit (INDEPENDENT_AMBULATORY_CARE_PROVIDER_SITE_OTHER): Payer: PPO | Admitting: Family Medicine

## 2021-11-12 ENCOUNTER — Encounter: Payer: Self-pay | Admitting: Family Medicine

## 2021-11-12 VITALS — BP 122/82 | HR 54 | Ht 67.0 in | Wt 186.0 lb

## 2021-11-12 DIAGNOSIS — H9193 Unspecified hearing loss, bilateral: Secondary | ICD-10-CM

## 2021-11-12 DIAGNOSIS — M542 Cervicalgia: Secondary | ICD-10-CM | POA: Diagnosis not present

## 2021-11-12 DIAGNOSIS — S060X0D Concussion without loss of consciousness, subsequent encounter: Secondary | ICD-10-CM | POA: Diagnosis not present

## 2021-11-12 NOTE — Patient Instructions (Signed)
Thank you for coming in today.   Recheck in 2 months.   I am very happy how you have progressed.
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
## 2021-11-14 DIAGNOSIS — R8271 Bacteriuria: Secondary | ICD-10-CM | POA: Diagnosis not present

## 2021-11-14 DIAGNOSIS — N401 Enlarged prostate with lower urinary tract symptoms: Secondary | ICD-10-CM | POA: Diagnosis not present

## 2021-11-14 DIAGNOSIS — N451 Epididymitis: Secondary | ICD-10-CM | POA: Diagnosis not present

## 2021-11-14 DIAGNOSIS — R351 Nocturia: Secondary | ICD-10-CM | POA: Diagnosis not present
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
## 2021-11-15 ENCOUNTER — Ambulatory Visit: Payer: PPO | Attending: Family Medicine

## 2021-11-15 ENCOUNTER — Ambulatory Visit: Payer: PPO | Admitting: Speech Pathology

## 2021-11-15 ENCOUNTER — Other Ambulatory Visit: Payer: Self-pay | Admitting: Cardiology

## 2021-11-15 DIAGNOSIS — M542 Cervicalgia: Secondary | ICD-10-CM | POA: Diagnosis not present

## 2021-11-15 DIAGNOSIS — I25118 Atherosclerotic heart disease of native coronary artery with other forms of angina pectoris: Secondary | ICD-10-CM

## 2021-11-15 DIAGNOSIS — I10 Essential (primary) hypertension: Secondary | ICD-10-CM

## 2021-11-15 DIAGNOSIS — G4486 Cervicogenic headache: Secondary | ICD-10-CM | POA: Insufficient documentation

## 2021-11-18 ENCOUNTER — Ambulatory Visit: Payer: PPO

## 2021-11-18 DIAGNOSIS — M542 Cervicalgia: Secondary | ICD-10-CM | POA: Diagnosis not present

## 2021-11-18 DIAGNOSIS — G4486 Cervicogenic headache: Secondary | ICD-10-CM
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
## 2021-11-21 ENCOUNTER — Ambulatory Visit: Payer: PPO | Admitting: Speech Pathology

## 2021-11-21 ENCOUNTER — Ambulatory Visit: Payer: PPO | Admitting: Physical Therapy

## 2021-11-24 ENCOUNTER — Telehealth: Payer: Self-pay | Admitting: Cardiology

## 2021-11-25 ENCOUNTER — Telehealth: Payer: Self-pay

## 2021-11-25 ENCOUNTER — Ambulatory Visit: Payer: PPO
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
## 2021-11-25 NOTE — Telephone Encounter (Signed)
I spoke with Mrs. Lamont, patient having mild worsening dyspnea even on minimal activities.  No rest pain, at rest he is doing well, no PND or orthopnea, no leg edema or hemoptysis.  In view of gradually worsening dyspnea even doing routine activities at home, he will need further cardiac work-up.  He has had a low to intermediate risk nuclear stress test, I would recommend that we probably proceed with cardiac catheterization.  I would like to see him back in the office in 2 weeks to further discuss.  Loop recorder monitoring does not reveal any heart block or bradycardia.  So his dizziness and dyspnea, probably not related to any significant arrhythmias.  At least for now.  We will continue to monitor.

## 2021-11-25 NOTE — Telephone Encounter (Signed)
Patient wife called and left a message on VM.  Message is as follows:   "This is [name], my husband [name], he is Dr Irven Shelling patient he had a stress test July 24th and there was blockages but they were going to treat with meds Dr Einar Gip you told him if there was any symptoms to call him or Friday afternoon he went down to get the mail and come [place] and had shortness of breath dizziness him almost passed out as heart was pounding his blood pressure was 106 over 95 but it took a little while to get over this it was late afternoon so I couldn't call you then just wanted to know if Dr Einar Gip you wanted to do anything about this or see him so call us at [phone] .  I called and spoke with patient's wife and she stated that patient has been having SOB, dizziness and near syncope and they just want to know if he need to be seen soon or go to the ED. Please advise.

## 2021-11-26 DIAGNOSIS — D692 Other nonthrombocytopenic purpura: Secondary | ICD-10-CM | POA: Diagnosis not present

## 2021-11-26 DIAGNOSIS — F5221 Male erectile disorder: Secondary | ICD-10-CM | POA: Diagnosis not present

## 2021-11-26 DIAGNOSIS — I1310 Hypertensive heart and chronic kidney disease without heart failure, with stage 1 through stage 4 chronic kidney disease, or unspecified chronic kidney disease: Secondary | ICD-10-CM | POA: Diagnosis not present

## 2021-11-26 DIAGNOSIS — E7800 Pure hypercholesterolemia, unspecified: Secondary | ICD-10-CM | POA: Diagnosis not present

## 2021-11-26 DIAGNOSIS — N183 Chronic kidney disease, stage 3 unspecified: Secondary | ICD-10-CM | POA: Diagnosis not present

## 2021-11-26 DIAGNOSIS — I209 Angina pectoris, unspecified: Secondary | ICD-10-CM | POA: Diagnosis not present

## 2021-11-26 DIAGNOSIS — Z9861 Coronary angioplasty status: Secondary | ICD-10-CM | POA: Diagnosis not present

## 2021-11-26 DIAGNOSIS — R7302 Impaired glucose tolerance (oral): Secondary | ICD-10-CM | POA: Diagnosis not present

## 2021-11-26 DIAGNOSIS — N1831 Chronic kidney disease, stage 3a: Secondary | ICD-10-CM | POA: Diagnosis not present

## 2021-11-26 DIAGNOSIS — I272 Pulmonary hypertension, unspecified: Secondary | ICD-10-CM | POA: Diagnosis not present

## 2021-11-26 DIAGNOSIS — N401 Enlarged prostate with lower urinary tract symptoms: Secondary | ICD-10-CM | POA: Diagnosis not present

## 2021-11-26 DIAGNOSIS — E663 Overweight: Secondary | ICD-10-CM | POA: Diagnosis not present

## 2021-11-28 ENCOUNTER — Ambulatory Visit: Payer: PPO | Admitting: Speech Pathology

## 2021-11-28 ENCOUNTER — Ambulatory Visit: Payer: PPO

## 2021-12-02 ENCOUNTER — Ambulatory Visit: Payer: PPO

## 2021-12-05 ENCOUNTER — Ambulatory Visit: Payer: PPO | Admitting: Speech Pathology

## 2021-12-05 ENCOUNTER — Ambulatory Visit: Payer: PPO

## 2021-12-12 DIAGNOSIS — N401 Enlarged prostate with lower urinary tract symptoms: Secondary | ICD-10-CM | POA: Diagnosis not present

## 2021-12-12 DIAGNOSIS — R351 Nocturia: Secondary | ICD-10-CM | POA: Diagnosis not present

## 2021-12-12 DIAGNOSIS — Z4509 Encounter for adjustment and management of other cardiac device: Secondary | ICD-10-CM | POA: Diagnosis not present

## 2021-12-12 DIAGNOSIS — N5201 Erectile dysfunction due to arterial insufficiency: Secondary | ICD-10-CM | POA: Diagnosis not present

## 2021-12-12 DIAGNOSIS — R55 Syncope and collapse: Secondary | ICD-10-CM | POA: Diagnosis not present

## 2021-12-12 DIAGNOSIS — N451 Epididymitis: Secondary | ICD-10-CM | POA: Diagnosis not present

## 2021-12-12 DIAGNOSIS — Z95818 Presence of other cardiac implants and grafts: Secondary | ICD-10-CM | POA: Diagnosis not present

## 2021-12-13 ENCOUNTER — Encounter: Payer: Self-pay | Admitting: Cardiology

## 2021-12-13 ENCOUNTER — Ambulatory Visit: Payer: PPO | Admitting: Cardiology

## 2021-12-13 VITALS — BP 130/70 | HR 60 | Temp 98.1°F | Resp 16 | Ht 67.0 in | Wt 184.0 lb

## 2021-12-13 DIAGNOSIS — I951 Orthostatic hypotension: Secondary | ICD-10-CM | POA: Diagnosis not present

## 2021-12-13 DIAGNOSIS — N1832 Chronic kidney disease, stage 3b: Secondary | ICD-10-CM

## 2021-12-13 DIAGNOSIS — Z95818 Presence of other cardiac implants and grafts: Secondary | ICD-10-CM

## 2021-12-13 DIAGNOSIS — R0609 Other forms of dyspnea: Secondary | ICD-10-CM

## 2021-12-13 DIAGNOSIS — I25118 Atherosclerotic heart disease of native coronary artery with other forms of angina pectoris: Secondary | ICD-10-CM

## 2021-12-13 DIAGNOSIS — I10 Essential (primary) hypertension: Secondary | ICD-10-CM

## 2021-12-13 DIAGNOSIS — I129 Hypertensive chronic kidney disease with stage 1 through stage 4 chronic kidney disease, or unspecified chronic kidney disease: Secondary | ICD-10-CM | POA: Diagnosis not present

## 2021-12-13 MED ORDER — METOPROLOL SUCCINATE ER 25 MG PO TB24: 12.5000 mg | ORAL_TABLET | ORAL | 3 refills | Status: DC
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
## 2021-12-17 ENCOUNTER — Ambulatory Visit: Payer: PPO | Attending: Family Medicine

## 2021-12-17 DIAGNOSIS — R41841 Cognitive communication deficit: Secondary | ICD-10-CM | POA: Insufficient documentation

## 2021-12-17 NOTE — Addendum Note (Signed)
Addended by: Garald Balding B on: 12/17/2021 05:33 PM   Modules accepted: Orders

## 2021-12-17 NOTE — Therapy (Signed)
OUTPATIENT SPEECH LANGUAGE PATHOLOGY TREATMENT NOTE   Patient Name: [name] MRN: [id number] DOB:1945/07/19, 76 y.o., male Today's Date: 12/17/2021  PCP: Haywood Pao, MD REFERRING PROVIDER: Gregor Hams, MD   END OF SESSION:   End of Session - 12/17/21 1453     Visit Number 5    Number of Visits 9    Date for SLP Re-Evaluation 01/22/22    SLP Start Time 65    SLP Stop Time  5329    SLP Time Calculation (min) 40 min    Activity Tolerance Patient tolerated treatment well              Past Medical History:  Diagnosis Date   Anginal pain ([place])    Arthritis    Basal cell carcinoma    back of neck   Blood transfusion without reported diagnosis 2006   during open heart surgery   CAD (coronary artery disease)    Diverticulosis 2013   Noted on Colonoscopy   Dyspnea    with activity and walking, sometime has SOB walking to mailbox and back to the house   GERD (gastroesophageal reflux disease)    History of colon polyps 2008   Noted on Colonoscopy   History of esophageal stricture 2003   Noted on EGD   Hyperlipidemia    Hypertension    IVCD (intraventricular conduction defect) 05/2018   Noted on EKG   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   Noted on EKG   Left axis deviation 05/2018   Noted on EKG   Lipoma 04/20/2018   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   Loop Biotronik Biomonitor III for syncope and collapse 04/09/2021 04/09/2021   LVH (left ventricular hypertrophy) 05/2018   Noted on EKG   Phimosis    Pneumonia    Tuberculosis 1956   Wears glasses    Wheezing    Past Surgical History:  Procedure Laterality Date   ANGIOPLASTY  2010   3 stents placed   BACK SURGERY     CATARACT EXTRACTION W/ [place], BILATERAL  2012   bilateral   CIRCUMCISION N/A 08/30/2018   Procedure: CIRCUMCISION ADULT;  Surgeon: Kathie Rhodes, MD;  Location: [place];  Service: Urology;  Laterality: N/A;    COLONOSCOPY  2013, 2008   CORONARY ARTERY BYPASS GRAFT  2006   x4   ESOPHAGOGASTRODUODENOSCOPY  10/2011   EYE SURGERY     JOINT REPLACEMENT     KNEE ARTHROSCOPY  2001   LUMBAR DISC SURGERY  1998   LUMBAR LAMINECTOMY/DECOMPRESSION MICRODISCECTOMY N/A 07/13/2019   Procedure: Laminectomy and Foraminotomy - Lumbar Two-Lumbar Three - Lumbar Three-Lumbar Four;  Surgeon: Eustace Moore, MD;  Location: [place];  Service: Neurosurgery;  Laterality: N/A;  Laminectomy and Foraminotomy - Lumbar Two-Lumbar Three - Lumbar Three-Lumbar Four   ROTATOR CUFF REPAIR Left 05/24/2020   TENDON RECONSTRUCTION Right 10/27/2013   Procedure: RIGHT OPEN TRICEPS REPAIR;  Surgeon: Renette Butters, MD;  Location: 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 No NPRS scale: 0/10 Pain location:  Pain orientation: PAIN TYPE:    OBJECTIVE:   TODAY'S TREATMENT: 12/18/21: After SLP discussed pt's current status with pt, SLP called wife in from lobby. Wife states pt's short term memory still problematic and gave example of viewing and funeral that pt continually asked about last week. SLP suggested writing on calendar, and putting sticky note on pt's daily to-do list which he is now keeping. Reviewed  other memory strategies for pt and reiterated pt needs to repeat audibly when wife tells him something to give better opportunity to place item into next sequential memory storage.   11/05/21: Skilled ST services targeted memory strategies this session. SLP educated on external memory strategies this session. SLP taught pt and wife how to set alarm on phones for medication reminders. Pt and wife report they forget to take medications in the evening occasionally, especially if they go out to eat.   Pt reports he is feeling better - around 70% back to his "normal" self.   Will cont memory strategies next session.   10/28/21: Pt reports he is feeling much better - wife is in agreement. SLP observed faster processing today of verbally provided information. Pt was able to answer more questions to completion today and did not demonstrate any anomia.   Completed pt and caregiver education this session regarding communication strategies to decrease communication breakdowns. Pt and wife reported they would trial the following re: reducing distractions, getting each others attention prior to conversations, introducing a new topic prior to changing.   To begin memory strategies next session and cont to monitor progress.   10/24/21: Completed pt and caregiver education this session regarding cognitive communication and symptoms. For tx, SLP had pt read "what you might notice" and attempt to check off deficits. Pt had difficulty identifying his deficits/what he is experiencing. Wife was able to fill-in other information and provide examples. SLP edu on the importance of being aware of deficits before we address them. Pt cont to demonstrate slow processing and difficulty with understanding information, as well as finding other words. Will discuss communication strategies next session.   PATIENT EDUCATION: Education details: Cog-comm Person educated: Patient and Spouse Education method: Holiday representative Education comprehension: verbalized understanding and needs further education         GOALS: Goals reviewed with patient? Yes   SHORT TERM GOALS: Target date: 11/15/2021     Pt will increase auditory comprehension by recalling and verbalizing strategies to assist with active listening during conversation with minA. Baseline: Goal status: ONGOING   2. Pt will comprehend functional memory or visual aids for recall of important information during structured conversations with minA. Baseline:  Goal status: ONGOING   LONG TERM GOALS: Target date: 12/13/2021     Pt will comprehend functional memory or visual aids for recall of important information during unstructured conversations independently. Baseline:  Goal status: ONGOING   2. Pt will increase auditory comprehension by recalling and verbalizing strategies to assist with active listening during unstructured conversation independently. Baseline:  Goal status: ONGOING   ASSESSMENT:   CLINICAL IMPRESSION: Recertification today - See pt's previous tx notes. SLP rec skilled ST services to address cognitive-communication impairment  to maximize functional communication and independence. Cont with current POC.     OBJECTIVE IMPAIRMENTS include attention, memory, and executive functioning. These impairments are limiting patient from household responsibilities and effectively communicating at home and in community. Factors affecting potential to achieve goals and functional outcome are  NA .Marland Kitchen Patient will benefit from skilled SLP services to address above impairments and improve overall function.   REHAB POTENTIAL: Good   PLAN: SLP FREQUENCY: 1x/week   SLP DURATION: 8 weeks   PLANNED INTERVENTIONS: Environmental controls, Cueing hierachy, Internal/external aids, Functional tasks, SLP instruction and feedback, Compensatory strategies, and Patient/family education       [facility], [place] 12/17/2021, 2:54 PM

## 2021-12-19 DIAGNOSIS — I10 Essential (primary) hypertension: Secondary | ICD-10-CM | POA: Diagnosis not present

## 2021-12-19 DIAGNOSIS — N1832 Chronic kidney disease, stage 3b: Secondary | ICD-10-CM | POA: Diagnosis not present

## 2021-12-20 LAB — BASIC METABOLIC PANEL
BUN/Creatinine Ratio: 17 (ref 10–24)
BUN: 22 mg/dL (ref 8–27)
CO2: 24 mmol/L (ref 20–29)
Calcium: 9.5 mg/dL (ref 8.6–10.2)
Chloride: 97 mmol/L (ref 96–106)
Creatinine, Ser: 1.3 mg/dL — ABNORMAL HIGH (ref 0.76–1.27)
Glucose: 91 mg/dL (ref 70–99)
Potassium: 4.5 mmol/L (ref 3.5–5.2)
Sodium: 134 mmol/L (ref 134–144)
eGFR: 57 mL/min/{1.73_m2} — ABNORMAL LOW (ref >59)

## 2021-12-20 LAB — CBC
Hematocrit: 37.9 % (ref 37.5–51.0)
Hemoglobin: 12.8 g/dL — ABNORMAL LOW (ref 13.0–17.7)
MCH: 32.5 pg (ref 26.6–33.0)
MCHC: 33.8 g/dL (ref 31.5–35.7)
MCV: 96 fL (ref 79–97)
Platelets: 249 10*3/uL (ref 150–450)
RBC: 3.94 x10E6/uL — ABNORMAL LOW (ref 4.14–5.80)
RDW: 12.7 % (ref 11.6–15.4)
WBC: 6.7 10*3/uL (ref 3.4–10.8)

## 2021-12-24 ENCOUNTER — Encounter (HOSPITAL_COMMUNITY)
Admission: RE | Disposition: A | Discharge status: Home / Self Care | Payer: Self-pay | Source: Home / Self Care | Attending: Cardiology

## 2021-12-24 ENCOUNTER — Ambulatory Visit: Payer: PPO

## 2021-12-24 ENCOUNTER — Other Ambulatory Visit: Payer: Self-pay

## 2021-12-24 ENCOUNTER — Ambulatory Visit (HOSPITAL_COMMUNITY)
Admission: RE | Admit: 2021-12-24 | Discharge: 2021-12-25 | Disposition: A | Discharge status: Home / Self Care | Payer: PPO | Attending: Cardiology | Admitting: Cardiology

## 2021-12-24 DIAGNOSIS — I2584 Coronary atherosclerosis due to calcified coronary lesion: Secondary | ICD-10-CM | POA: Diagnosis not present

## 2021-12-24 DIAGNOSIS — I25119 Atherosclerotic heart disease of native coronary artery with unspecified angina pectoris: Secondary | ICD-10-CM | POA: Diagnosis present

## 2021-12-24 DIAGNOSIS — E782 Mixed hyperlipidemia: Secondary | ICD-10-CM | POA: Insufficient documentation

## 2021-12-24 DIAGNOSIS — Z951 Presence of aortocoronary bypass graft: Secondary | ICD-10-CM | POA: Diagnosis not present

## 2021-12-24 DIAGNOSIS — I129 Hypertensive chronic kidney disease with stage 1 through stage 4 chronic kidney disease, or unspecified chronic kidney disease: Secondary | ICD-10-CM | POA: Diagnosis not present

## 2021-12-24 DIAGNOSIS — Z955 Presence of coronary angioplasty implant and graft: Secondary | ICD-10-CM

## 2021-12-24 DIAGNOSIS — K219 Gastro-esophageal reflux disease without esophagitis: Secondary | ICD-10-CM | POA: Insufficient documentation

## 2021-12-24 DIAGNOSIS — Z9861 Coronary angioplasty status: Secondary | ICD-10-CM

## 2021-12-24 DIAGNOSIS — N1832 Chronic kidney disease, stage 3b: Secondary | ICD-10-CM | POA: Diagnosis not present

## 2021-12-24 DIAGNOSIS — I25118 Atherosclerotic heart disease of native coronary artery with other forms of angina pectoris: Secondary | ICD-10-CM | POA: Diagnosis present

## 2021-12-24 DIAGNOSIS — I25719 Atherosclerosis of autologous vein coronary artery bypass graft(s) with unspecified angina pectoris: Secondary | ICD-10-CM | POA: Diagnosis not present

## 2021-12-24 DIAGNOSIS — R0602 Shortness of breath: Secondary | ICD-10-CM | POA: Diagnosis not present

## 2021-12-24 DIAGNOSIS — I2582 Chronic total occlusion of coronary artery: Secondary | ICD-10-CM | POA: Diagnosis not present

## 2021-12-24 HISTORY — PX: CORONARY STENT INTERVENTION: CATH118234

## 2021-12-24 HISTORY — PX: LEFT HEART CATH AND CORS/GRAFTS ANGIOGRAPHY: CATH118250

## 2021-12-24 HISTORY — PX: CORONARY ATHERECTOMY: CATH118238

## 2021-12-24 LAB — POCT ACTIVATED CLOTTING TIME
Activated Clotting Time: 269 seconds
Activated Clotting Time: 263 seconds
Activated Clotting Time: 275 seconds
Activated Clotting Time: 257 seconds

## 2021-12-24 SURGERY — LEFT HEART CATH AND CORS/GRAFTS ANGIOGRAPHY
Anesthesia: LOCAL

## 2021-12-24 MED ORDER — HEPARIN SODIUM (PORCINE) 1000 UNIT/ML IJ SOLN
INTRAMUSCULAR | Status: AC
  Filled 2021-12-24: qty 10

## 2021-12-24 MED ORDER — LIDOCAINE HCL (PF) 1 % IJ SOLN
INTRAMUSCULAR | Status: AC
  Filled 2021-12-24: qty 30

## 2021-12-24 MED ORDER — OMEGA-3-ACID ETHYL ESTERS 1 G PO CAPS
1.0000 g | ORAL_CAPSULE | Freq: Two times a day (BID) | ORAL | Status: DC
  Administered 2021-12-24 – 2021-12-25 (×2): 1 g via ORAL
  Filled 2021-12-24 (×2): qty 1

## 2021-12-24 MED ORDER — VERAPAMIL HCL 2.5 MG/ML IV SOLN
INTRAVENOUS | Status: DC | PRN
  Administered 2021-12-24 (×2): 5 mL via INTRA_ARTERIAL

## 2021-12-24 MED ORDER — IOHEXOL 350 MG/ML SOLN
INTRAVENOUS | Status: DC | PRN
  Administered 2021-12-24: 220 mL

## 2021-12-24 MED ORDER — PROPYLENE GLYCOL 0.6 % OP SOLN: 1.0000 [drp] | Freq: Three times a day (TID) | OPHTHALMIC | Status: DC | PRN

## 2021-12-24 MED ORDER — HEPARIN SODIUM (PORCINE) 1000 UNIT/ML IJ SOLN
INTRAMUSCULAR | Status: DC | PRN
  Administered 2021-12-24: 4000 [IU] via INTRAVENOUS
  Administered 2021-12-24: 2000 [IU] via INTRAVENOUS
  Administered 2021-12-24: 5000 [IU] via INTRAVENOUS
  Administered 2021-12-24 (×2): 2000 [IU] via INTRAVENOUS

## 2021-12-24 MED ORDER — ONDANSETRON HCL 4 MG/2ML IJ SOLN
INTRAMUSCULAR | Status: DC | PRN
  Administered 2021-12-24: 4 mg via INTRAVENOUS

## 2021-12-24 MED ORDER — SODIUM CHLORIDE 0.9% FLUSH: 3.0000 mL | Freq: Two times a day (BID) | INTRAVENOUS | Status: DC

## 2021-12-24 MED ORDER — MIDAZOLAM HCL 2 MG/2ML IJ SOLN
INTRAMUSCULAR | Status: AC
  Filled 2021-12-24: qty 2

## 2021-12-24 MED ORDER — MIDAZOLAM HCL 2 MG/2ML IJ SOLN
INTRAMUSCULAR | Status: DC | PRN
  Administered 2021-12-24 (×2): 1 mg via INTRAVENOUS
  Administered 2021-12-24: 2 mg via INTRAVENOUS

## 2021-12-24 MED ORDER — HEPARIN (PORCINE) IN NACL 1000-0.9 UT/500ML-% IV SOLN
INTRAVENOUS | Status: DC | PRN
  Administered 2021-12-24 (×4): 500 mL

## 2021-12-24 MED ORDER — SODIUM CHLORIDE 0.9 % WEIGHT BASED INFUSION
1.0000 mL/kg/h | INTRAVENOUS | Status: AC
  Administered 2021-12-24: 1 mL/kg/h via INTRAVENOUS

## 2021-12-24 MED ORDER — SODIUM CHLORIDE 0.9 % WEIGHT BASED INFUSION
3.0000 mL/kg/h | INTRAVENOUS | Status: DC
Start: 2021-12-24 — End: 2021-12-24
  Administered 2021-12-24: 3 mL/kg/h via INTRAVENOUS

## 2021-12-24 MED ORDER — SODIUM CHLORIDE 0.9 % IV SOLN: 250.0000 mL | INTRAVENOUS | Status: DC | PRN

## 2021-12-24 MED ORDER — NITROGLYCERIN 0.4 MG SL SUBL: 0.4000 mg | SUBLINGUAL_TABLET | SUBLINGUAL | Status: DC | PRN

## 2021-12-24 MED ORDER — FENTANYL CITRATE (PF) 100 MCG/2ML IJ SOLN
INTRAMUSCULAR | Status: AC
  Filled 2021-12-24: qty 2

## 2021-12-24 MED ORDER — SODIUM CHLORIDE 0.9 % WEIGHT BASED INFUSION: 1.0000 mL/kg/h | INTRAVENOUS | Status: DC

## 2021-12-24 MED ORDER — FENTANYL CITRATE (PF) 100 MCG/2ML IJ SOLN
INTRAMUSCULAR | Status: DC | PRN
  Administered 2021-12-24: 25 ug via INTRAVENOUS
  Administered 2021-12-24: 50 ug via INTRAVENOUS
  Administered 2021-12-24 (×2): 25 ug via INTRAVENOUS

## 2021-12-24 MED ORDER — LIDOCAINE HCL (PF) 1 % IJ SOLN
INTRAMUSCULAR | Status: DC | PRN
  Administered 2021-12-24: 2 mL

## 2021-12-24 MED ORDER — COQ10 100 MG PO CAPS: 100.0000 mg | ORAL_CAPSULE | Freq: Two times a day (BID) | ORAL | Status: DC

## 2021-12-24 MED ORDER — NITROGLYCERIN 1 MG/10 ML FOR IR/CATH LAB
INTRA_ARTERIAL | Status: DC | PRN
  Administered 2021-12-24 (×2): 200 ug via INTRACORONARY

## 2021-12-24 MED ORDER — CLOPIDOGREL BISULFATE 300 MG PO TABS
ORAL_TABLET | ORAL | Status: DC | PRN
  Administered 2021-12-24: 300 mg via ORAL

## 2021-12-24 MED ORDER — METOPROLOL SUCCINATE ER 25 MG PO TB24
12.5000 mg | ORAL_TABLET | ORAL | Status: DC
  Administered 2021-12-25: 12.5 mg via ORAL
  Filled 2021-12-24 (×2): qty 1

## 2021-12-24 MED ORDER — VERAPAMIL HCL 2.5 MG/ML IV SOLN
INTRAVENOUS | Status: AC
  Filled 2021-12-24: qty 2

## 2021-12-24 MED ORDER — SODIUM CHLORIDE 0.9% FLUSH
3.0000 mL | Freq: Two times a day (BID) | INTRAVENOUS | Status: DC
  Administered 2021-12-25 (×2): 3 mL via INTRAVENOUS

## 2021-12-24 MED ORDER — ASPIRIN 81 MG PO TBEC: 81.0000 mg | DELAYED_RELEASE_TABLET | Freq: Every evening | ORAL | Status: DC

## 2021-12-24 MED ORDER — CLOPIDOGREL BISULFATE 300 MG PO TABS
ORAL_TABLET | ORAL | Status: AC
  Filled 2021-12-24: qty 1

## 2021-12-24 MED ORDER — ACETAMINOPHEN 325 MG PO TABS
650.0000 mg | ORAL_TABLET | Freq: Four times a day (QID) | ORAL | Status: DC | PRN
  Administered 2021-12-24 (×2): 650 mg via ORAL
  Filled 2021-12-24 (×2): qty 2

## 2021-12-24 MED ORDER — SODIUM CHLORIDE 0.9% FLUSH: 3.0000 mL | INTRAVENOUS | Status: DC | PRN

## 2021-12-24 MED ORDER — PANTOPRAZOLE SODIUM 40 MG PO TBEC
40.0000 mg | DELAYED_RELEASE_TABLET | Freq: Every day | ORAL | Status: DC
  Administered 2021-12-25: 40 mg via ORAL
  Filled 2021-12-24 (×2): qty 1

## 2021-12-24 MED ORDER — ASPIRIN 81 MG PO CHEW
81.0000 mg | CHEWABLE_TABLET | ORAL | Status: AC
  Administered 2021-12-24: 81 mg via ORAL
  Filled 2021-12-24: qty 1

## 2021-12-24 MED ORDER — HYDRALAZINE HCL 20 MG/ML IJ SOLN: 5.0000 mg | INTRAMUSCULAR | Status: AC | PRN

## 2021-12-24 MED ORDER — POLYETHYL GLYCOL-PROPYL GLYCOL 0.4-0.3 % OP GEL: 1.0000 | Freq: Every day | OPHTHALMIC | Status: DC

## 2021-12-24 MED ORDER — NORTRIPTYLINE HCL 25 MG PO CAPS
25.0000 mg | ORAL_CAPSULE | Freq: Every day | ORAL | Status: DC
  Administered 2021-12-24: 25 mg via ORAL
  Filled 2021-12-24 (×2): qty 1

## 2021-12-24 MED ORDER — HYDRALAZINE HCL 25 MG PO TABS: 25.0000 mg | ORAL_TABLET | Freq: Three times a day (TID) | ORAL | Status: DC | PRN

## 2021-12-24 MED ORDER — CLOPIDOGREL BISULFATE 75 MG PO TABS: 75.0000 mg | ORAL_TABLET | Freq: Every day | ORAL | 1 refills | Status: DC

## 2021-12-24 MED ORDER — POLYVINYL ALCOHOL 1.4 % OP SOLN: 2.0000 [drp] | OPHTHALMIC | Status: DC | PRN

## 2021-12-24 MED ORDER — METHOCARBAMOL 500 MG PO TABS
500.0000 mg | ORAL_TABLET | Freq: Two times a day (BID) | ORAL | Status: DC
  Administered 2021-12-24 – 2021-12-25 (×2): 500 mg via ORAL
  Filled 2021-12-24 (×2): qty 1

## 2021-12-24 MED ORDER — CLOPIDOGREL BISULFATE 75 MG PO TABS
75.0000 mg | ORAL_TABLET | Freq: Every day | ORAL | Status: DC
  Administered 2021-12-25: 75 mg via ORAL
  Filled 2021-12-24: qty 1

## 2021-12-24 MED ORDER — EZETIMIBE 10 MG PO TABS
5.0000 mg | ORAL_TABLET | Freq: Every evening | ORAL | Status: DC
  Administered 2021-12-24: 5 mg via ORAL
  Filled 2021-12-24: qty 1

## 2021-12-24 MED ORDER — LOSARTAN POTASSIUM 25 MG PO TABS
12.5000 mg | ORAL_TABLET | Freq: Every evening | ORAL | Status: DC
  Administered 2021-12-24: 12.5 mg via ORAL
  Filled 2021-12-24: qty 1

## 2021-12-24 MED ORDER — HEPARIN (PORCINE) IN NACL 1000-0.9 UT/500ML-% IV SOLN
INTRAVENOUS | Status: AC
  Filled 2021-12-24: qty 1000

## 2021-12-24 MED ORDER — PSYLLIUM 95 % PO PACK
1.0000 | PACK | Freq: Every day | ORAL | Status: DC
  Filled 2021-12-24 (×2): qty 1

## 2021-12-24 MED ORDER — NITROGLYCERIN 1 MG/10 ML FOR IR/CATH LAB
INTRA_ARTERIAL | Status: AC
  Filled 2021-12-24: qty 10

## 2021-12-24 MED ORDER — ROSUVASTATIN CALCIUM 20 MG PO TABS
20.0000 mg | ORAL_TABLET | Freq: Every day | ORAL | Status: DC
  Administered 2021-12-24 – 2021-12-25 (×2): 20 mg via ORAL
  Filled 2021-12-24 (×2): qty 1

## 2021-12-24 SURGICAL SUPPLY — 42 items
ADDWIRE .014X145 (WIRE) ×1
BALL SAPPHIRE NC24 3.0X12 (BALLOONS) ×1
BALLN EUPHORA RX 1.5X6 (BALLOONS) ×1
BALLN EUPHORA RX 2.0X10 (BALLOONS) ×2
BALLN SAPPHIRE 1.5X12 (BALLOONS) ×1
BALLOON EUPHORA RX 1.5X6 (BALLOONS) IMPLANT
BALLOON EUPHORA RX 2.0X10 (BALLOONS) IMPLANT
BALLOON SAPPHIRE 1.5X12 (BALLOONS) IMPLANT
BALLOON SAPPHIRE NC24 3.0X12 (BALLOONS) IMPLANT
BALLOON TAKERU 1.5X12 (BALLOONS) IMPLANT
BAND ZEPHYR COMPRESS 30 LONG (HEMOSTASIS) IMPLANT
CATH INFINITI 5 FR AR2 MOD (CATHETERS) IMPLANT
CATH INFINITI 5FR MULTPACK ANG (CATHETERS) IMPLANT
CATH TELEPORT (CATHETERS) IMPLANT
CATH TELESCOPE 6F GEC (CATHETERS) IMPLANT
CATH VISTA GUIDE 6FR XB3 (CATHETERS) IMPLANT
CATH VISTA GUIDE 6FR XB3.5 (CATHETERS) IMPLANT
CROWN DIAMONDBACK CLASSIC 1.25 (BURR) IMPLANT
ELECT DEFIB PAD ADLT CADENCE (PAD) IMPLANT
EXTENSION ADDWIRE .014X145 (WIRE) IMPLANT
GLIDESHEATH SLEND SS 6F .021 (SHEATH) IMPLANT
GUIDEWIRE ANGLED .035X150CM (WIRE) IMPLANT
GUIDEWIRE INQWIRE 1.5J.035X260 (WIRE) IMPLANT
INQWIRE 1.5J .035X260CM (WIRE) ×1
KIT ENCORE 26 ADVANTAGE (KITS) IMPLANT
KIT HEART LEFT (KITS) ×1 IMPLANT
LUBRICANT VIPERSLIDE CORONARY (MISCELLANEOUS) IMPLANT
MAT PREVALON FULL STRYKER (MISCELLANEOUS) IMPLANT
PACK CARDIAC CATHETERIZATION (CUSTOM PROCEDURE TRAY) ×1 IMPLANT
SHEATH PINNACLE 5F 10CM (SHEATH) IMPLANT
STENT SYNERGY XD 2.50X24 (Permanent Stent) IMPLANT
STENT SYNERGY XD 3.0X12 (Permanent Stent) IMPLANT
SYNERGY XD 2.50X24 (Permanent Stent) ×1 IMPLANT
SYNERGY XD 3.0X12 (Permanent Stent) ×1 IMPLANT
TRANSDUCER W/STOPCOCK (MISCELLANEOUS) ×1 IMPLANT
TUBING CIL FLEX 10 FLL-RA (TUBING) ×1 IMPLANT
WIRE COUGAR XT STRL 190CM (WIRE) IMPLANT
WIRE DOC EXTENSION .014X145CM (WIRE) IMPLANT
WIRE EMERALD 3MM-J .035X150CM (WIRE) IMPLANT
WIRE RUNTHROUGH .014X180CM (WIRE) IMPLANT
WIRE RUNTHROUGH IZANAI 014 300 (WIRE) IMPLANT
WIRE VIPERWIRE COR FLEX .012 (WIRE) IMPLANT

## 2021-12-24 NOTE — Interval H&P Note (Signed)
History and Physical Interval Note:  12/24/2021 9:15 AM  [name]  has presented today for surgery, with the diagnosis of shortness of breath, cad.  The various methods of treatment have been discussed with the patient and family. After consideration of risks, benefits and other options for treatment, the patient has consented to  Procedure(s): LEFT HEART CATH AND CORS/GRAFTS ANGIOGRAPHY (N/A) and possible angioplasty Cath Lab Visit (complete for each Cath Lab visit)  Clinical Evaluation Leading to the Procedure:   ACS: No.  Non-ACS:    Anginal Classification: CCS III  Anti-ischemic medical therapy: Maximal Therapy (2 or more classes of medications)  Non-Invasive Test Results: Intermediate-risk stress test findings: cardiac mortality 1-3%/year  Prior CABG: Previous CABG   as a surgical intervention.  The patient's history has been reviewed, patient examined, no change in status, stable for surgery.  I have reviewed the patient's chart and labs.  Questions were answered to the patient's satisfaction.     [name]

## 2021-12-24 NOTE — Discharge Summary (Signed)
Physician Discharge Summary  Patient ID: [name] MRN: [id number] DOB/AGE: [birth year] 77 y.o. 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 Dyspnea on exertion   Significant Diagnostic Studies:  EKG at 1723: Normal sinus rhythm/sinus bradycardia at rate of 55 bpm, left axis deviation, left anterior fascicular block.  Nonspecific scratch that IVCD, IRBBB.  Diffuse nonspecific T wave abnormality.  Compared to 09/11/2021, nonspecific T abnormality new.  Left Heart Catheterization 12/24/21:  LV: 154/10, EDP 28 mmHg.  Ao 144/64, mean 95 mmHg.  No pressure gradient across the aortic valve. RCA: Dominant vessel.  Mildly diffusely diseased.  PDA has moderate diffuse disease. LM: Calcified, moderate-sized vessel.  Mild disease is noted. LAD: Large vessel, occluded in the proximal to mid segment, there is a small D1 and a large D2 which is D1 equivalent which is occluded in the ostium.  Large SP1.  Mid to distal LAD is mildly diseased. LCx: Moderate caliber vessel, calcified.  Has a high-grade 95 to 98% calcific stenosis in the proximal segment.  Gives origin to moderate-sized OM1 which is occluded in its ostium. LIMA to LAD patent. Free RIMA Y graft to D1 is patent, inferior limb to OM1 occluded. SVG to RCA occluded.   Intervention data: Successful but extremely complex and difficult PCI to the proximal and ostial circumflex followed by stenting into the left main following orbital atherectomy.  2 overlapping stents, 3.0 x 12 mm  Synergy XD into the left main and into ostial circumflex overlapping the 2.5 x 24 mm Synergy XD DES.  Stenosis reduced from 95% to 0% with TIMI-3 to TIMI-3 flow maintained.   Prolonged procedure, prolonged exposure of radiation as well and will need to be watched for radiation injury.   Fluoro time: 74.8 (min)  Hospital Course: [name] is a  Caucasian male patient with CAD SP CABG in 1996 with LIMA to LAD, free RIMA to D1 + OM1 and SVG to PDA.  He had undergone stenting to ostium of the free RIMA with a 4.0 x 15 mm non-DES stent, history of stenting to the vein graft to the RCA in 2010.  He has been having frequent episodes of worsening dyspnea and also angina pectoris, he is on maximal medical therapy including Ranexa.  He had undergone outpatient nuclear stress test which I reviewed reversible defect in the basal inferior and inferolateral myocardium and was deemed to be low risk follow-up due to persistent chest discomfort, he is now brought to the cardiac catheterization lab to evaluate his coronary anatomy.  He had high-grade stenosis of unprotected circumflex coronary artery for which he underwent orbital atherectomy followed by stenting.  Below procedure requiring 2.5 to 3.5 hours of cardiac catheterization lab time, patient did remarkably well throughout the procedure.  He did receive excessive radiation.  Recommendations on discharge: Patient will need outpatient follow-up and discharge instructions regarding radiation injury.  I did my best to coned-down radiation exposure and also change the ankles during procedure.  Instructions for radiation burns given to the patient.  He will need dual antiplatelet  therapy with aspirin and Plavix for 6 months and aspirin indefinitely.  I have discontinued Ranexa for his angina pectoris, I expect significant improvement in symptoms of angina.  He needs to postpone any surgical interventions for at least 6 months that are elective.  He will be  scheduled for an office visit in 1 week to 10 days.  Discharge Exam:    12/25/2021    8:51 AM 12/25/2021    6:58 AM 12/24/2021    8:21 PM  Vitals with BMI  Systolic 290 211 155  Diastolic 68 68 73  Pulse 59 61 62     Physical Exam Vitals and nursing note reviewed.  Neck:     Vascular: No JVD.  Cardiovascular:     Rate and Rhythm: Normal rate and regular rhythm.     Pulses: Intact distal pulses.     Heart sounds: S1 normal and S2 normal. Murmur heard.     Early systolic murmur is present with a grade of 2/6 at the upper right sternal border.     No gallop.     Comments: Left radial site is healing well. No hematoma or bruit.  Pulmonary:     Effort: Pulmonary effort is normal.     Breath sounds: Normal breath sounds.  Abdominal:     General: Bowel sounds are normal.     Palpations: Abdomen is soft.  Musculoskeletal:     Right lower leg: No edema.     Left lower leg: No edema.  Skin:    Comments: No significant redness (except posterior thorax). No wounds / rashes.     Labs:   Lab Results  Component Value Date   WBC 7.5 12/25/2021   HGB 10.8 (L) 12/25/2021   HCT 30.8 (L) 12/25/2021   MCV 96.0 12/25/2021   PLT 204 12/25/2021    Recent Labs  Lab 12/25/21 0214  NA 134*  K 4.4  CL 103  CO2 26  BUN 17  CREATININE 1.48*  CALCIUM 9.2  GLUCOSE 117*    Lipid Panel     Component Value Date/Time   CHOL 84 03/16/2016 1521   TRIG 67 03/16/2016 1521   HDL 32 (L) 03/16/2016 1521   CHOLHDL 2.6 03/16/2016 1521   VLDL 13 03/16/2016 1521   LDLCALC 39 03/16/2016 1521    BNP (last 3 results) No results for input(s): "BNP" in the last 8760 hours.  HEMOGLOBIN A1C No results found for: "HGBA1C", "MPG"  Cardiac Panel (last 3 results) No results for input(s): "CKTOTAL", "CKMB", "TROPONINIHS", "RELINDX" in the last 72 hours.   TSH No results for input(s): "TSH" in the last 8760 hours.  FOLLOW UP PLANS AND APPOINTMENTS Discharge Instructions     Amb Referral to  Cardiac Rehabilitation   Complete by: As directed    Diagnosis:  Coronary Stents PTCA     After initial evaluation and assessments completed: Virtual Based Care may be provided alone or in conjunction with Phase 2 Cardiac Rehab based on patient barriers.: Yes   Intensive Cardiac Rehabilitation (ICR) [place] location only OR Traditional Cardiac Rehabilitation (TCR) *If criteria for ICR are not met will enroll in TCR [facility] only): Yes      Allergies as of 12/25/2021       Reactions   Codeine Nausea And Vomiting   Guaifenesin & Derivatives Nausea And Vomiting   Terbinafine And Related Other (See Comments)   Muscle pain   Penicillins Rash   Tolerates Cefazolin without problems.Marland Kitchen.[employer]  Medication List     STOP taking these medications    ranolazine 1000 MG SR tablet Commonly known as: RANEXA       TAKE these medications    acetaminophen 500 MG tablet Commonly known as: TYLENOL Take 1,000 mg by mouth every 6 (six) hours as needed for mild pain.   ascorbic acid 500 MG tablet Commonly known as: VITAMIN C Take 500 mg by mouth daily.   aspirin EC 81 MG tablet Take 81 mg by mouth every evening. Swallow whole.   clopidogrel 75 MG tablet Commonly known as: PLAVIX Take 1 tablet (75 mg total) by mouth daily with breakfast.   CoQ10 100 MG Caps Take 100 mg by mouth 2 (two) times daily.   ezetimibe 10 MG tablet Commonly known as: ZETIA TAKE 1/2 TABLET BY MOUTH EVERY EVENING   hydrALAZINE 25 MG tablet Commonly known as: APRESOLINE Take 1 tablet (25 mg total) by mouth 3 (three) times daily as needed (For SBP >140 mm Hg).   losartan 25 MG tablet Commonly known as: COZAAR TAKE 1/2 TABLET BY MOUTH EVERY EVENING   Metamucil 28.3 % Powd Generic drug: Psyllium Take 1 Package by mouth daily as needed (For constipation).   methocarbamol 500 MG tablet Commonly known as: ROBAXIN Take 1 tablet (500 mg total) by mouth 2 (two) times daily.   metoprolol succinate 25 MG 24 hr  tablet Commonly known as: TOPROL-XL Take 0.5 tablets (12.5 mg total) by mouth every morning.   multivitamin tablet Take 1 tablet by mouth in the morning.   nitroGLYCERIN 0.4 MG SL tablet Commonly known as: NITROSTAT TAKE 1 TABLET EVERY 5 MINUTES AS NEEDED FOR CHEST PAIN. What changed:  how much to take how to take this when to take this reasons to take this additional instructions   nortriptyline 25 MG capsule Commonly known as: PAMELOR TAKE 1 CAPSULE BY MOUTH AT BEDTIME.   omega-3 acid ethyl esters 1 g capsule Commonly known as: LOVAZA Take 1 g by mouth 2 (two) times daily.   pantoprazole 40 MG tablet Commonly known as: Protonix Take 1 tablet (40 mg total) by mouth daily.   Polyethyl Glycol-Propyl Glycol 0.4-0.3 % Gel ophthalmic gel Commonly known as: SYSTANE Place 1 application into both eyes at bedtime.   PreserVision AREDS 2 Caps Take 1 capsule by mouth 2 (two) times daily.   pyridoxine 100 MG tablet Commonly known as: B-6 Take 100 mg by mouth every evening.   rosuvastatin 20 MG tablet Commonly known as: CRESTOR TAKE ONE TABLET BY MOUTH EVERYDAY AT BEDTIME   Systane Balance 0.6 % Soln Generic drug: Propylene Glycol Place 1 drop into both eyes 3 (three) times daily as needed (dry/irritated eyes.).   TUMS ULTRA PO Take 2 tablets by mouth daily as needed (acid reflux/indigestion.).   Vitamin B-12 5000 MCG Tbdp Take 5,000 mcg by mouth every evening.        Follow-up Information     [name], MD Follow up on 12/31/2021.   Specialty: Cardiology Why: 12/31/2021 10:15 AM. please bring all medications to the visit Contact information: [address] [place] [address] [phone]                Total time spent: 33 minutes.   Rex Kras, [place], [facility]  Pager: [phone] Office: [phone]

## 2021-12-25 ENCOUNTER — Encounter (HOSPITAL_COMMUNITY): Payer: Self-pay | Admitting: Cardiology

## 2021-12-25 DIAGNOSIS — E782 Mixed hyperlipidemia: Secondary | ICD-10-CM | POA: Diagnosis not present

## 2021-12-25 DIAGNOSIS — K219 Gastro-esophageal reflux disease without esophagitis: Secondary | ICD-10-CM | POA: Diagnosis not present

## 2021-12-25 DIAGNOSIS — R0609 Other forms of dyspnea: Secondary | ICD-10-CM | POA: Diagnosis not present

## 2021-12-25 DIAGNOSIS — R0602 Shortness of breath: Secondary | ICD-10-CM | POA: Diagnosis not present

## 2021-12-25 DIAGNOSIS — N1832 Chronic kidney disease, stage 3b: Secondary | ICD-10-CM | POA: Diagnosis not present

## 2021-12-25 DIAGNOSIS — Z955 Presence of coronary angioplasty implant and graft: Secondary | ICD-10-CM | POA: Diagnosis not present

## 2021-12-25 DIAGNOSIS — I2584 Coronary atherosclerosis due to calcified coronary lesion: Secondary | ICD-10-CM | POA: Diagnosis not present

## 2021-12-25 DIAGNOSIS — I129 Hypertensive chronic kidney disease with stage 1 through stage 4 chronic kidney disease, or unspecified chronic kidney disease: Secondary | ICD-10-CM | POA: Diagnosis not present

## 2021-12-25 DIAGNOSIS — Z951 Presence of aortocoronary bypass graft: Secondary | ICD-10-CM | POA: Diagnosis not present

## 2021-12-25 DIAGNOSIS — I25718 Atherosclerosis of autologous vein coronary artery bypass graft(s) with other forms of angina pectoris: Secondary | ICD-10-CM | POA: Diagnosis not present

## 2021-12-25 DIAGNOSIS — I25119 Atherosclerotic heart disease of native coronary artery with unspecified angina pectoris: Secondary | ICD-10-CM | POA: Diagnosis not present

## 2021-12-25 DIAGNOSIS — I25118 Atherosclerotic heart disease of native coronary artery with other forms of angina pectoris: Secondary | ICD-10-CM | POA: Diagnosis not present

## 2021-12-25 LAB — BASIC METABOLIC PANEL
Anion gap: 5 (ref 5–15)
BUN: 17 mg/dL (ref 8–23)
CO2: 26 mmol/L (ref 22–32)
Calcium: 9.2 mg/dL (ref 8.9–10.3)
Chloride: 103 mmol/L (ref 98–111)
Creatinine, Ser: 1.48 mg/dL — ABNORMAL HIGH (ref 0.61–1.24)
GFR, Estimated: 48 mL/min — ABNORMAL LOW (ref >60)
Glucose, Bld: 117 mg/dL — ABNORMAL HIGH (ref 70–99)
Potassium: 4.4 mmol/L (ref 3.5–5.1)
Sodium: 134 mmol/L — ABNORMAL LOW (ref 135–145)

## 2021-12-25 LAB — CBC
HCT: 30.8 % — ABNORMAL LOW (ref 39.0–52.0)
Hemoglobin: 10.8 g/dL — ABNORMAL LOW (ref 13.0–17.0)
MCH: 33.6 pg (ref 26.0–34.0)
MCHC: 35.1 g/dL (ref 30.0–36.0)
MCV: 96 fL (ref 80.0–100.0)
Platelets: 204 10*3/uL (ref 150–400)
RBC: 3.21 MIL/uL — ABNORMAL LOW (ref 4.22–5.81)
RDW: 13.5 % (ref 11.5–15.5)
WBC: 7.5 10*3/uL (ref 4.0–10.5)
nRBC: 0 % (ref 0.0–0.2)

## 2021-12-25 NOTE — Plan of Care (Signed)

## 2021-12-25 NOTE — Progress Notes (Signed)
CARDIAC REHAB PHASE I   PRE:  Rate/Rhythm: 58 SB    BP: sitting 150/72    SaO2:   MODE:  Ambulation: 400 ft   POST:  Rate/Rhythm: 69 SR    BP: sitting 128/65     SaO2:   Pt ambulated hall with contact guard and his cane. C/o hip discomfort with distance but denied SOB/CP. To recliner. Discussed with pt and wife stents, Plavix importance, exercise, diet, NTG and CRPII. Receptive, will refer to [place]. Pt also likes to exercise at his church facility on the stationary bike. [phone]   Yves Dill BS, ACSM-CEP 12/25/2021 11:01 AM

## 2021-12-26 LAB — LIPOPROTEIN A (LPA): Lipoprotein (a): 216 nmol/L — ABNORMAL HIGH (ref <75.0)

## 2021-12-26 NOTE — Progress Notes (Signed)
Yes

## 2021-12-31 ENCOUNTER — Ambulatory Visit: Payer: PPO

## 2021-12-31 ENCOUNTER — Ambulatory Visit: Payer: PPO | Admitting: Cardiology

## 2021-12-31 ENCOUNTER — Encounter: Payer: Self-pay | Admitting: Cardiology

## 2021-12-31 VITALS — BP 117/71 | HR 66 | Temp 98.0°F | Resp 16 | Ht 68.0 in | Wt 186.4 lb

## 2021-12-31 DIAGNOSIS — Z951 Presence of aortocoronary bypass graft: Secondary | ICD-10-CM | POA: Diagnosis not present

## 2021-12-31 DIAGNOSIS — R41841 Cognitive communication deficit: Secondary | ICD-10-CM | POA: Diagnosis not present

## 2021-12-31 DIAGNOSIS — I10 Essential (primary) hypertension: Secondary | ICD-10-CM

## 2021-12-31 DIAGNOSIS — I25118 Atherosclerotic heart disease of native coronary artery with other forms of angina pectoris: Secondary | ICD-10-CM

## 2021-12-31 DIAGNOSIS — R0609 Other forms of dyspnea: Secondary | ICD-10-CM | POA: Diagnosis not present

## 2021-12-31 NOTE — Therapy (Signed)
OUTPATIENT SPEECH LANGUAGE PATHOLOGY TREATMENT NOTE/DISCHARGE SUMMARY   Patient Name: [name] MRN: [id number] DOB:1945-10-20, 76 y.o., male Today's Date: 12/31/2021  PCP: Haywood Pao, MD REFERRING PROVIDER: Gregor Hams, MD   END OF SESSION:   End of Session - 12/31/21 1414     Visit Number 6    Number of Visits 9    Date for SLP Re-Evaluation 01/22/22    SLP Start Time 1406   5 minutes late   SLP Stop Time  1445    SLP Time Calculation (min) 39 min    Activity Tolerance Patient tolerated treatment well              Past Medical History:  Diagnosis Date   Anginal pain ([place])    Arthritis    Basal cell carcinoma    back of neck   Blood transfusion without reported diagnosis 2006   during open heart surgery   CAD (coronary artery disease)    Diverticulosis 2013   Noted on Colonoscopy   Dyspnea    with activity and walking, sometime has SOB walking to mailbox and back to the house   GERD (gastroesophageal reflux disease)    History of colon polyps 2008   Noted on Colonoscopy   History of esophageal stricture 2003   Noted on EGD   Hyperlipidemia    Hypertension    IVCD (intraventricular conduction defect) 05/2018   Noted on EKG   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   Noted on EKG   Left axis deviation 05/2018   Noted on EKG   Lipoma 04/20/2018   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   Loop Biotronik Biomonitor III for syncope and collapse 04/09/2021 04/09/2021   LVH (left ventricular hypertrophy) 05/2018   Noted on EKG   Phimosis    Pneumonia    Tuberculosis 1956   Wears glasses    Wheezing    Past Surgical History:  Procedure Laterality Date   ANGIOPLASTY  2010   3 stents placed   BACK SURGERY     CATARACT EXTRACTION W/ [place], BILATERAL  2012   bilateral   CIRCUMCISION N/A 08/30/2018   Procedure: CIRCUMCISION ADULT;  Surgeon: Kathie Rhodes, MD;  Location: [place];  Service:  Urology;  Laterality: N/A;   COLONOSCOPY  2013, 2008   CORONARY ARTERY BYPASS GRAFT  2006   x4   CORONARY ATHERECTOMY N/A 12/24/2021   Procedure: CORONARY ATHERECTOMY;  Surgeon: [name], MD;  Location: [place] [facility];  Service: Cardiovascular;  Laterality: N/A;   CORONARY STENT INTERVENTION N/A 12/24/2021   Procedure: CORONARY STENT INTERVENTION;  Surgeon: [name], MD;  Location: [place] [facility];  Service: Cardiovascular;  Laterality: N/A;   ESOPHAGOGASTRODUODENOSCOPY  10/2011   EYE SURGERY     JOINT REPLACEMENT     KNEE ARTHROSCOPY  2001   LEFT HEART CATH AND CORS/GRAFTS ANGIOGRAPHY N/A 12/24/2021   Procedure: LEFT HEART CATH AND CORS/GRAFTS ANGIOGRAPHY;  Surgeon: [name], MD;  Location: [place] [facility];  Service: Cardiovascular;  Laterality: N/A;   LUMBAR [place] MICRODISCECTOMY N/A 07/13/2019   Procedure: Laminectomy and Foraminotomy - Lumbar Two-Lumbar Three - Lumbar Three-Lumbar Four;  Surgeon: Eustace Moore, MD;  Location: [place];  Service: Neurosurgery;  Laterality: N/A;  Laminectomy and Foraminotomy - Lumbar Two-Lumbar Three - Lumbar Three-Lumbar Four   ROTATOR CUFF REPAIR Left 05/24/2020   TENDON  RECONSTRUCTION Right 10/27/2013   Procedure: RIGHT OPEN TRICEPS REPAIR;  Surgeon: Renette Butters, MD;  Location: [place];  Service: Orthopedics;  Laterality: Right;   Patient Active Problem List   Diagnosis Date Noted   Status post coronary artery stent placement    Post PTCA 12/24/2021   Hx of CABG    Syncope and collapse 04/09/2021   Loop Biotronik Biomonitor III for syncope and collapse 04/09/2021 04/09/2021   S/P lumbar laminectomy 07/13/2019   Chest pain 03/16/2016   Constipation 03/16/2016   Hypertension    Hyperlipidemia    Coronary artery disease involving native coronary artery of native heart with angina pectoris (HCC)    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   Remaining deficits: Mild cognitive deficits, mostly with memory. Pt and wife agree pt is using compensations functionally and successfully.   Education / Equipment: Compensations for deficit areas.   Patient agrees to discharge. Patient goals were met. Patient is being discharged due to meeting the stated rehab goals..     ONSET DATE: 09/11/21    REFERRING DIAG: U20.2R4Y (ICD-10-CM) - Concussion without loss of consciousness, subsequent encounter   THERAPY DIAG:  Cognitive communication deficit  Rationale for Evaluation and Treatment Rehabilitation  SUBJECTIVE: "Wouldn't hurt my feelings." -pt on possible d/c today "I think y'all have helped him a lot."  Wife on possible d/c today  PAIN:  Are you having pain? No   OBJECTIVE:   TODAY'S TREATMENT: 12/31/21: Pt with two stent placement last week Tuesday - pt seems to have stronger voice, and also wife and pt state improved memory. Pt cont to work on his family reunion without incident, using lists, figuring out cost for food, etc without assistance. Pt and wife report [place] is using compensations for memory successfully.  Pt and wife agree pt is ready for d/c at this time.  12/18/21: After SLP discussed pt's current status with pt, SLP called wife in from lobby. Wife states pt's short term memory still problematic and gave example of viewing and funeral that pt continually asked about last week. SLP suggested writing on calendar, and putting sticky note on pt's daily to-do list which he is now keeping. Reviewed other memory strategies for pt and reiterated pt needs to repeat audibly when wife tells him something to give better opportunity to place item into next sequential memory storage.   11/05/21: Skilled ST services targeted memory strategies this session. SLP educated on external memory  strategies this session. SLP taught pt and wife how to set alarm on phones for medication reminders. Pt and wife report they forget to take medications in the evening occasionally, especially if they go out to eat.   Pt reports he is feeling better - around 70% back to his "normal" self.   Will cont memory strategies next session.   10/28/21: Pt reports he is feeling much better - wife is in agreement. SLP observed faster processing today of verbally provided information. Pt was able to answer more questions to completion today and did not demonstrate any anomia.   Completed pt and caregiver education this session regarding communication strategies to decrease communication breakdowns. Pt and wife reported they would trial the following re: reducing distractions, getting each others attention prior to conversations, introducing a new topic prior to changing.   To begin  memory strategies next session and cont to monitor progress.   10/24/21: Completed pt and caregiver education this session regarding cognitive communication and symptoms. For tx, SLP had pt read "what you might notice" and attempt to check off deficits. Pt had difficulty identifying his deficits/what he is experiencing. Wife was able to fill-in other information and provide examples. SLP edu on the importance of being aware of deficits before we address them. Pt cont to demonstrate slow processing and difficulty with understanding information, as well as finding other words. Will discuss communication strategies next session.   PATIENT EDUCATION: Education details: Cog-comm Person educated: Patient and Spouse Education method: Customer service manager Education comprehension: verbalized understanding and needs further education         GOALS: Goals reviewed with patient? Yes   SHORT TERM GOALS: Target date: 11/15/2021     Pt will increase auditory comprehension by recalling and verbalizing strategies to assist with active  listening during conversation with minA. Baseline: Goal status: Deferred   2. Pt will comprehend functional memory or visual aids for recall of important information during structured conversations with minA. Baseline:  Goal status: deferred   LONG TERM GOALS: Target date: 12/13/2021 , 01/22/22   Pt will comprehend functional memory or visual aids for recall of important information during unstructured conversations independently. Baseline:  Goal status: Met   2. Pt will increase auditory comprehension by recalling and verbalizing strategies to assist with active listening during unstructured conversation independently. Baseline:  Goal status: Met   ASSESSMENT:   CLINICAL IMPRESSION: See pt's previous tx notes. Discharge today.      OBJECTIVE IMPAIRMENTS include attention, memory, and executive functioning. These impairments are limiting patient from household responsibilities and effectively communicating at home and in community. Factors affecting potential to achieve goals and functional outcome are  NA .Marland Kitchen Patient will benefit from skilled SLP services to address above impairments and improve overall function.   REHAB POTENTIAL: Good   PLAN: Discharge today.    PLANNED INTERVENTIONS: Environmental controls, Cueing hierachy, Internal/external aids, Functional tasks, SLP instruction and feedback, Compensatory strategies, and Patient/family education       [facility], [place] 12/31/2021, 2:15 PM
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
## 2022-01-03 ENCOUNTER — Encounter: Payer: Self-pay | Admitting: Internal Medicine

## 2022-01-04 DIAGNOSIS — Z23 Encounter for immunization: Secondary | ICD-10-CM | POA: Diagnosis not present

## 2022-01-07 ENCOUNTER — Ambulatory Visit: Payer: PPO

## 2022-01-12 DIAGNOSIS — R55 Syncope and collapse: Secondary | ICD-10-CM | POA: Diagnosis not present

## 2022-01-12 DIAGNOSIS — Z95818 Presence of other cardiac implants and grafts: Secondary | ICD-10-CM | POA: Diagnosis not present

## 2022-01-12 DIAGNOSIS — Z4509 Encounter for adjustment and management of other cardiac device: Secondary | ICD-10-CM | POA: Diagnosis not present

## 2022-01-13 ENCOUNTER — Ambulatory Visit: Payer: PPO | Admitting: Cardiology

## 2022-01-14 ENCOUNTER — Ambulatory Visit: Payer: PPO
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
## 2022-01-15 ENCOUNTER — Ambulatory Visit: Payer: PPO | Admitting: Family Medicine

## 2022-01-15 VITALS — BP 116/70 | HR 64 | Ht 68.0 in | Wt 182.0 lb

## 2022-01-15 DIAGNOSIS — M542 Cervicalgia: Secondary | ICD-10-CM | POA: Diagnosis not present
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
## 2022-01-15 NOTE — Patient Instructions (Addendum)
Thank you for coming in today.   I've referred you to Physical Therapy.  Let us know if you don't hear from them in one week.   Lets recheck in 6 weeks. If not better we can do MRI or CT myelogram.

## 2022-01-16 ENCOUNTER — Ambulatory Visit: Payer: PPO | Attending: Family Medicine

## 2022-01-16 DIAGNOSIS — R252 Cramp and spasm: Secondary | ICD-10-CM | POA: Insufficient documentation

## 2022-01-16 DIAGNOSIS — M542 Cervicalgia: Secondary | ICD-10-CM | POA: Insufficient documentation

## 2022-01-16 DIAGNOSIS — G4486 Cervicogenic headache: Secondary | ICD-10-CM | POA: Diagnosis not present

## 2022-01-22 ENCOUNTER — Ambulatory Visit: Payer: PPO | Admitting: Physical Therapy

## 2022-01-22 ENCOUNTER — Encounter: Payer: Self-pay | Admitting: Physical Therapy

## 2022-01-22 DIAGNOSIS — M542 Cervicalgia: Secondary | ICD-10-CM

## 2022-01-22 DIAGNOSIS — R252 Cramp and spasm: Secondary | ICD-10-CM

## 2022-01-22 DIAGNOSIS — G4486 Cervicogenic headache: Secondary | ICD-10-CM

## 2022-01-22 NOTE — Therapy (Signed)
OUTPATIENT PHYSICAL THERAPY CERVICAL TREATMENT   Patient Name: [name] MRN: [id number] DOB:1946/02/28, 76 y.o., male Today's Date: 01/22/2022   PT End of Session - 01/22/22 1346     Visit Number 2    Date for PT Re-Evaluation 03/27/22    PT Start Time 1345    PT Stop Time 1430    PT Time Calculation (min) 45 min    Activity Tolerance Patient tolerated treatment well    Behavior During Therapy [facility] for tasks assessed/performed             Past Medical History:  Diagnosis Date   Anginal pain ([place])    Arthritis    Basal cell carcinoma    back of neck   Blood transfusion without reported diagnosis 2006   during open heart surgery   CAD (coronary artery disease)    Diverticulosis 2013   Noted on Colonoscopy   Dyspnea    with activity and walking, sometime has SOB walking to mailbox and back to the house   GERD (gastroesophageal reflux disease)    History of colon polyps 2008   Noted on Colonoscopy   History of esophageal stricture 2003   Noted on EGD   Hyperlipidemia    Hypertension    IVCD (intraventricular conduction defect) 05/2018   Noted on EKG   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   Noted on EKG   Left axis deviation 05/2018   Noted on EKG   Lipoma 04/20/2018   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   Loop Biotronik Biomonitor III for syncope and collapse 04/09/2021 04/09/2021   LVH (left ventricular hypertrophy) 05/2018   Noted on EKG   Phimosis    Pneumonia    Tuberculosis 1956   Wears glasses    Wheezing    Past Surgical History:  Procedure Laterality Date   ANGIOPLASTY  2010   3 stents placed   BACK SURGERY     CATARACT EXTRACTION W/ [place], BILATERAL  2012   bilateral   CIRCUMCISION N/A 08/30/2018   Procedure: CIRCUMCISION ADULT;  Surgeon: Kathie Rhodes, MD;  Location: [place];  Service: Urology;  Laterality: N/A;   COLONOSCOPY  2013, 2008   CORONARY ARTERY BYPASS GRAFT  2006   x4    CORONARY ATHERECTOMY N/A 12/24/2021   Procedure: CORONARY ATHERECTOMY;  Surgeon: [name], MD;  Location: [place] [facility];  Service: Cardiovascular;  Laterality: N/A;   CORONARY STENT INTERVENTION N/A 12/24/2021   Procedure: CORONARY STENT INTERVENTION;  Surgeon: [name], MD;  Location: [place] [facility];  Service: Cardiovascular;  Laterality: N/A;   ESOPHAGOGASTRODUODENOSCOPY  10/2011   EYE SURGERY     JOINT REPLACEMENT     KNEE ARTHROSCOPY  2001   LEFT HEART CATH AND CORS/GRAFTS ANGIOGRAPHY N/A 12/24/2021   Procedure: LEFT HEART CATH AND CORS/GRAFTS ANGIOGRAPHY;  Surgeon: [name], MD;  Location: [place] [facility];  Service: Cardiovascular;  Laterality: N/A;   LUMBAR [place] MICRODISCECTOMY N/A 07/13/2019   Procedure: Laminectomy and Foraminotomy - Lumbar Two-Lumbar Three - Lumbar Three-Lumbar Four;  Surgeon: Eustace Moore, MD;  Location: [place];  Service: Neurosurgery;  Laterality: N/A;  Laminectomy and Foraminotomy - Lumbar Two-Lumbar Three - Lumbar Three-Lumbar Four   ROTATOR CUFF REPAIR Left 05/24/2020   TENDON RECONSTRUCTION Right 10/27/2013   Procedure: RIGHT OPEN TRICEPS REPAIR;  Surgeon: Renette Butters, MD;  Location: [facility];  Service: Orthopedics;  Laterality: Right;   Patient Active Problem List   Diagnosis Date Noted   Status post coronary artery stent placement    Post PTCA 12/24/2021   Hx of CABG    Syncope and collapse 04/09/2021   Loop Biotronik Biomonitor III for syncope and collapse 04/09/2021 04/09/2021   S/P lumbar laminectomy 07/13/2019   Chest pain 03/16/2016   Constipation 03/16/2016   Hypertension    Hyperlipidemia    Coronary artery disease involving native coronary artery of native heart with angina pectoris (HCC)    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   PERTINENT HISTORY:  Coronary stent 12/2021,   PAIN:  Are you having pain? Yes: NPRS scale: 5/10 Pain location: back of the neck Pain description: dull, ache Aggravating factors: turning head Relieving factors: trying to move it   PRECAUTIONS: None  WEIGHT BEARING RESTRICTIONS: No  FALLS:  Has patient fallen in last 6 months? No  LIVING ENVIRONMENT: Lives with: lives with their spouse Lives in: House/apartment Stairs: Yes: External: 3 steps; on right going up Has following equipment at home: Single point cane  OCCUPATION: Retired  PLOF: Independent  PATIENT GOALS: to get rid of neck pain   NEXT MD VISIT:   OBJECTIVE:   DIAGNOSTIC FINDINGS:  CT CERVICAL SPINE FINDINGS   Alignment: Normal alignment of the cervical vertebral bodies.   Skull base and vertebrae: Normal craniocervical junction. No loss of vertebral body height or disc height. Normal facet articulation. No evidence of fracture.   Soft tissues and spinal canal: No prevertebral soft tissue swelling. No perispinal or epidural hematoma.   Disc levels:  Unremarkable   Upper chest: Clear   Other: subcutaneous bruising in the LEFT neck above the clavicle.   IMPRESSION: 1. No intracranial trauma. 2. No cervical spine fracture. 3. Subcutaneous bruising in the LEFT neck superior to the clavicle.    PATIENT SURVEYS:  FOTO 61  COGNITION: Overall cognitive status: Within functional limits for tasks assessed and History of cognitive impairments -  at baseline  SENSATION: WFL  POSTURE:  rounded shoulders and forward head  PALPATION: Tightness in bilateral upper traps and rhomboids, TTP with some jump signs with palpation around Rt UT and L rhomboid/medial border of L scapula  CERVICAL ROM:   Active ROM A/PROM (deg) eval  Flexion WFL  Extension Pain at end range  Right lateral flexion Very tight and w/pain  Left lateral flexion Very tight w/pain  Right rotation 50% w/pain  Left rotation 50% w/pain   (Blank rows = not tested)  UPPER EXTREMITY ROM: grossly WFL   UPPER EXTREMITY MMT: grossly 5/5   FUNCTIONAL TESTS:  5 times sit to stand: 12.45s Timed up and go (TUG): 12.07s  TODAY'S TREATMENT:                                                                                                                              DATE:  01/22/22  NuStep L5 x 6 min DN performed By Lead PT M. Albright Shoulder ER red 2x10 Horiz Abd red 2x10 Seated rows Green 2x10 MHP cervical spine x10 min    PATIENT EDUCATION:  Education details: HEP and POC Person educated: Patient Education method: Explanation Education comprehension: verbalized understanding  HOME EXERCISE PROGRAM: Access Code: [email or web address] URL: [email or web address] Date: 01/16/2022 Prepared by: Andris Baumann  Exercises - Seated Upper Trapezius Stretch  - 2 x daily - 7 x weekly - 30 hold - Seated Levator Scapulae Stretch  - 2 x daily - 7 x weekly - 30 hold - Shoulder External Rotation and Scapular Retraction with Resistance  - 1 x daily - 7 x weekly - 2 sets - 10 reps - Doorway Pec Stretch at 90 Degrees Abduction  - 2 x daily - 7 x weekly - 30 hold - Cervical Retraction with Resistance  - 1 x daily - 7 x weekly - 2 sets - 10 reps  ASSESSMENT:  CLINICAL IMPRESSION: Pt enters with reports of neck soreness. Lead PT performed DN after aerobic warm up. Some increase in shoulder soreness with horizontal abduction. Postural cue needed with prow. MHP applied wit cervical spine to aid with any potential  soreness.  OBJECTIVE IMPAIRMENTS: decreased ROM and pain.   REHAB POTENTIAL: Good  CLINICAL DECISION MAKING: Stable/uncomplicated  EVALUATION COMPLEXITY: Low  GOALS: Goals reviewed with patient? Yes  SHORT TERM GOALS: Target date: 02/20/22  Patient will be independent with initial HEP.  Goal status: INITIAL   LONG TERM GOALS: Target date: 03/27/22  Patient will be independent with advanced/ongoing HEP to improve outcomes and carryover.  Goal status: INITIAL  2.  Patient will report 75% improvement in neck pain to improve QOL.  Baseline: 5/10 Goal status: INITIAL  3.  Patient will demonstrate full pain free cervical ROM for safety with driving.  Goal status: INITIAL  4.  Patient will report 9 on FOTO  to demonstrate improved functional ability.  Baseline: 61 Goal status: INITIAL  5.  Patient will demonstrate  improved posture to decrease muscle imbalance. Baseline: increased tightness in c-spine Goal status: INITIAL  PLAN:  PT FREQUENCY: 1-2x/week  PT DURATION: 10 weeks  PLANNED INTERVENTIONS: Therapeutic exercises, Therapeutic activity, Neuromuscular re-education, Balance training, Gait training, Patient/Family education, Self Care, Joint mobilization, Dry Needling, Electrical stimulation, Cryotherapy, Moist heat, Traction, Ionotophoresis '4mg'$ /ml Dexamethasone, and Manual therapy  PLAN FOR NEXT SESSION: manual STM to c-spine, stretching, deep neck flexor strengthening.    Scot Jun, PTA 01/22/2022, 1:46 PM

## 2022-01-27 NOTE — Therapy (Signed)
OUTPATIENT PHYSICAL THERAPY CERVICAL TREATMENT   Patient Name: [name] MRN: [id number] DOB:[birth year], 76 y.o., male Today's Date: 01/28/2022   PT End of Session - 01/28/22 1319     Visit Number 3    Date for PT Re-Evaluation 03/27/22    PT Start Time 1318    PT Stop Time 1400    PT Time Calculation (min) 42 min    Activity Tolerance Patient tolerated treatment well    Behavior During Therapy [facility] for tasks assessed/performed              Past Medical History:  Diagnosis Date   Anginal pain ([place])    Arthritis    Basal cell carcinoma    back of neck   Blood transfusion without reported diagnosis 2006   during open heart surgery   CAD (coronary artery disease)    Diverticulosis 2013   Noted on Colonoscopy   Dyspnea    with activity and walking, sometime has SOB walking to mailbox and back to the house   GERD (gastroesophageal reflux disease)    History of colon polyps 2008   Noted on Colonoscopy   History of esophageal stricture 2003   Noted on EGD   Hyperlipidemia    Hypertension    IVCD (intraventricular conduction defect) 05/2018   Noted on EKG   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   Noted on EKG   Left axis deviation 05/2018   Noted on EKG   Lipoma 04/20/2018   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   Loop Biotronik Biomonitor III for syncope and collapse 04/09/2021 04/09/2021   LVH (left ventricular hypertrophy) 05/2018   Noted on EKG   Phimosis    Pneumonia    Tuberculosis 1956   Wears glasses    Wheezing    Past Surgical History:  Procedure Laterality Date   ANGIOPLASTY  2010   3 stents placed   BACK SURGERY     CATARACT EXTRACTION W/ INTRAOCULAR LENS  IMPLANT, BILATERAL  2012   bilateral   CIRCUMCISION N/A 08/30/2018   Procedure: CIRCUMCISION ADULT;  Surgeon: Kathie Rhodes, MD;  Location: [place];  Service: Urology;  Laterality: N/A;   COLONOSCOPY  2013, 2008   CORONARY ARTERY BYPASS GRAFT  2006   x4    CORONARY ATHERECTOMY N/A 12/24/2021   Procedure: CORONARY ATHERECTOMY;  Surgeon: [name], MD;  Location: [place] [facility];  Service: Cardiovascular;  Laterality: N/A;   CORONARY STENT INTERVENTION N/A 12/24/2021   Procedure: CORONARY STENT INTERVENTION;  Surgeon: [name], MD;  Location: [place] [facility];  Service: Cardiovascular;  Laterality: N/A;   ESOPHAGOGASTRODUODENOSCOPY  10/2011   EYE SURGERY     JOINT REPLACEMENT     KNEE ARTHROSCOPY  2001   LEFT HEART CATH AND CORS/GRAFTS ANGIOGRAPHY N/A 12/24/2021   Procedure: LEFT HEART CATH AND CORS/GRAFTS ANGIOGRAPHY;  Surgeon: [name], MD;  Location: [place] [facility];  Service: Cardiovascular;  Laterality: N/A;   LUMBAR [place] MICRODISCECTOMY N/A 07/13/2019   Procedure: Laminectomy and Foraminotomy - Lumbar Two-Lumbar Three - Lumbar Three-Lumbar Four;  Surgeon: Eustace Moore, MD;  Location: [place];  Service: Neurosurgery;  Laterality: N/A;  Laminectomy and Foraminotomy - Lumbar Two-Lumbar Three - Lumbar Three-Lumbar Four   ROTATOR CUFF REPAIR Left 05/24/2020   TENDON RECONSTRUCTION Right 10/27/2013   Procedure: RIGHT OPEN TRICEPS REPAIR;  Surgeon: Renette Butters, MD;  Location: [facility];  Service: Orthopedics;  Laterality: Right;   Patient Active Problem List   Diagnosis Date Noted   Status post coronary artery stent placement    Post PTCA 12/24/2021   Hx of CABG    Syncope and collapse 04/09/2021   Loop Biotronik Biomonitor III for syncope and collapse 04/09/2021 04/09/2021   S/P lumbar laminectomy 07/13/2019   Chest pain 03/16/2016   Constipation 03/16/2016   Hypertension    Hyperlipidemia    Coronary artery disease involving native coronary artery of native heart with angina pectoris (HCC)    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   PERTINENT HISTORY:  Coronary stent 12/2021,   PAIN:  Are you having pain? Yes: NPRS scale: 4/10 Pain location: back of the neck Pain description: dull, ache Aggravating factors: turning head Relieving factors: trying to move it   PRECAUTIONS: None  WEIGHT BEARING RESTRICTIONS: No  FALLS:  Has patient fallen in last 6 months? No  LIVING ENVIRONMENT: Lives with: lives with their spouse Lives in: House/apartment Stairs: Yes: External: 3 steps; on right going up Has following equipment at home: Single point cane  OCCUPATION: Retired  PLOF: Independent  PATIENT GOALS: to get rid of neck pain   NEXT MD VISIT:   OBJECTIVE:   DIAGNOSTIC FINDINGS:  CT CERVICAL SPINE FINDINGS   Alignment: Normal alignment of the cervical vertebral bodies.   Skull base and vertebrae: Normal craniocervical junction. No loss of vertebral body height or disc height. Normal facet articulation. No evidence of fracture.   Soft tissues and spinal canal: No prevertebral soft tissue swelling. No perispinal or epidural hematoma.   Disc levels:  Unremarkable   Upper chest: Clear   Other: subcutaneous bruising in the LEFT neck above the clavicle.   IMPRESSION: 1. No intracranial trauma. 2. No cervical spine fracture. 3. Subcutaneous bruising in the LEFT neck superior to the clavicle.    PATIENT SURVEYS:  FOTO 61  COGNITION: Overall cognitive status: Within functional limits for tasks  assessed and History of cognitive impairments - at  baseline  SENSATION: WFL  POSTURE: rounded shoulders and forward head  PALPATION: Tightness in bilateral upper traps and rhomboids, TTP with some jump signs with palpation around Rt UT and L rhomboid/medial border of L scapula  CERVICAL ROM:   Active ROM A/PROM (deg) eval  Flexion WFL  Extension Pain at end range  Right lateral flexion Very tight and w/pain  Left lateral flexion Very tight w/pain  Right rotation 50% w/pain  Left rotation 50% w/pain   (Blank rows = not tested)  UPPER EXTREMITY ROM: grossly WFL   UPPER EXTREMITY MMT: grossly 5/5   FUNCTIONAL TESTS:  5 times sit to stand: 12.45s Timed up and go (TUG): 12.07s  TODAY'S TREATMENT:                                                                                                                              DATE:  01/28/22 NuStep L5 x64mns  Shoulder ER red 2x10  Horizontal ABD redTB 2x10 Rows and Lats 25# 2x10  Shoulder ext 10# 2x10  STM and passive stretching of upper traps and scalenes 30s  Chin tucks against band yellow 2x10  Shoulder flexion 3# 2x10 Lateral raises 3# 2x10  OHP yellow ball 2x10   01/22/22  NuStep L5 x 6 min DN performed By Lead PT M. Albright Shoulder ER red 2x10 Horiz Abd red 2x10 Seated rows Green 2x10 MHP cervical spine x10 min    PATIENT EDUCATION:  Education details: HEP and POC Person educated: Patient Education method: Explanation Education comprehension: verbalized understanding  HOME EXERCISE PROGRAM: Access Code: [email or web address]: [email or web address] Date: 01/16/2022 Prepared by: MAndris Baumann Exercises - Seated Upper Trapezius Stretch  - 2 x daily - 7 x weekly - 30 hold - Seated Levator Scapulae Stretch  - 2 x daily - 7 x weekly - 30 hold - Shoulder External Rotation and Scapular Retraction with Resistance  - 1 x daily - 7 x weekly - 2 sets - 10 reps - Doorway Pec Stretch at 90 Degrees Abduction  - 2 x daily - 7 x weekly - 30 hold - Cervical  Retraction with Resistance  - 1 x daily - 7 x weekly - 2 sets - 10 reps  ASSESSMENT:  CLINICAL IMPRESSION: Pt enters with reports of neck soreness. Postural cues needed with exercises. Jump signs with STM to R upper trap and rhomboid area, some trigger points noted and pain. Worked on some postural strengthening and neck.   OBJECTIVE IMPAIRMENTS: decreased ROM and pain.   REHAB POTENTIAL: Good  CLINICAL DECISION MAKING: Stable/uncomplicated  EVALUATION COMPLEXITY: Low  GOALS: Goals reviewed with patient? Yes  SHORT TERM GOALS: Target date: 02/20/22  Patient will be independent with initial HEP.  Goal status: INITIAL   LONG TERM GOALS: Target date: 03/27/22  Patient will be independent with advanced/ongoing HEP to improve outcomes and carryover.  Goal status: INITIAL  2.  Patient will report 75% improvement in neck pain to improve QOL.  Baseline: 5/10 Goal status: INITIAL  3.  Patient will demonstrate full pain free cervical ROM for safety with driving.  Goal status: INITIAL  4.  Patient will report 56 on FOTO  to demonstrate improved functional ability.  Baseline: 61 Goal status: INITIAL  5.  Patient will demonstrate improved posture to decrease muscle imbalance. Baseline: increased tightness in c-spine Goal status: INITIAL  PLAN:  PT FREQUENCY: 1-2x/week  PT DURATION: 10 weeks  PLANNED INTERVENTIONS: Therapeutic exercises, Therapeutic activity, Neuromuscular re-education, Balance training, Gait training, Patient/Family education, Self Care, Joint mobilization, Dry Needling, Electrical stimulation, Cryotherapy, Moist heat, Traction, Ionotophoresis '4mg'$ /ml Dexamethasone, and Manual therapy  PLAN FOR NEXT SESSION: manual STM to c-spine, stretching, deep neck flexor strengthening.    [place], PT 01/28/2022, 1:57 PM

## 2022-01-28 ENCOUNTER — Ambulatory Visit: Payer: PPO

## 2022-01-28 DIAGNOSIS — G4486 Cervicogenic headache: Secondary | ICD-10-CM

## 2022-01-28 DIAGNOSIS — M542 Cervicalgia: Secondary | ICD-10-CM

## 2022-01-28 DIAGNOSIS — R252 Cramp and spasm: Secondary | ICD-10-CM

## 2022-02-03 DIAGNOSIS — M160 Bilateral primary osteoarthritis of hip: Secondary | ICD-10-CM | POA: Diagnosis not present

## 2022-02-04 NOTE — Therapy (Signed)
OUTPATIENT PHYSICAL THERAPY CERVICAL TREATMENT   Patient Name: [name] MRN: [id number] DOB:[birth year], 76 y.o., male Today's Date: 02/05/2022   PT End of Session - 02/05/22 1316     Visit Number 4    Date for PT Re-Evaluation 03/27/22    PT Start Time 1315    PT Stop Time 1400    PT Time Calculation (min) 45 min    Activity Tolerance Patient tolerated treatment well    Behavior During Therapy [facility] for tasks assessed/performed              Past Medical History:  Diagnosis Date   Anginal pain ([place])    Arthritis    Basal cell carcinoma    back of neck   Blood transfusion without reported diagnosis 2006   during open heart surgery   CAD (coronary artery disease)    Diverticulosis 2013   Noted on Colonoscopy   Dyspnea    with activity and walking, sometime has SOB walking to mailbox and back to the house   GERD (gastroesophageal reflux disease)    History of colon polyps 2008   Noted on Colonoscopy   History of esophageal stricture 2003   Noted on EGD   Hyperlipidemia    Hypertension    IVCD (intraventricular conduction defect) 05/2018   Noted on EKG   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   Noted on EKG   Left axis deviation 05/2018   Noted on EKG   Lipoma 04/20/2018   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   Loop Biotronik Biomonitor III for syncope and collapse 04/09/2021 04/09/2021   LVH (left ventricular hypertrophy) 05/2018   Noted on EKG   Phimosis    Pneumonia    Tuberculosis 1956   Wears glasses    Wheezing    Past Surgical History:  Procedure Laterality Date   ANGIOPLASTY  2010   3 stents placed   BACK SURGERY     CATARACT EXTRACTION W/ INTRAOCULAR LENS  IMPLANT, BILATERAL  2012   bilateral   CIRCUMCISION N/A 08/30/2018   Procedure: CIRCUMCISION ADULT;  Surgeon: Kathie Rhodes, MD;  Location: [place];  Service: Urology;  Laterality: N/A;   COLONOSCOPY  2013, 2008   CORONARY ARTERY BYPASS GRAFT  2006   x4    CORONARY ATHERECTOMY N/A 12/24/2021   Procedure: CORONARY ATHERECTOMY;  Surgeon: [name], MD;  Location: [place] [facility];  Service: Cardiovascular;  Laterality: N/A;   CORONARY STENT INTERVENTION N/A 12/24/2021   Procedure: CORONARY STENT INTERVENTION;  Surgeon: [name], MD;  Location: [place] [facility];  Service: Cardiovascular;  Laterality: N/A;   ESOPHAGOGASTRODUODENOSCOPY  10/2011   EYE SURGERY     JOINT REPLACEMENT     KNEE ARTHROSCOPY  2001   LEFT HEART CATH AND CORS/GRAFTS ANGIOGRAPHY N/A 12/24/2021   Procedure: LEFT HEART CATH AND CORS/GRAFTS ANGIOGRAPHY;  Surgeon: [name], MD;  Location: [place] [facility];  Service: Cardiovascular;  Laterality: N/A;   LUMBAR [place] MICRODISCECTOMY N/A 07/13/2019   Procedure: Laminectomy and Foraminotomy - Lumbar Two-Lumbar Three - Lumbar Three-Lumbar Four;  Surgeon: Eustace Moore, MD;  Location: [place];  Service: Neurosurgery;  Laterality: N/A;  Laminectomy and Foraminotomy - Lumbar Two-Lumbar Three - Lumbar Three-Lumbar Four   ROTATOR CUFF REPAIR Left 05/24/2020   TENDON RECONSTRUCTION Right 10/27/2013   Procedure: RIGHT OPEN TRICEPS REPAIR;  Surgeon: Renette Butters, MD;  Location: [facility];  Service: Orthopedics;  Laterality: Right;   Patient Active Problem List   Diagnosis Date Noted   Status post coronary artery stent placement    Post PTCA 12/24/2021   Hx of CABG    Syncope and collapse 04/09/2021   Loop Biotronik Biomonitor III for syncope and collapse 04/09/2021 04/09/2021   S/P lumbar laminectomy 07/13/2019   Chest pain 03/16/2016   Constipation 03/16/2016   Hypertension    Hyperlipidemia    Coronary artery disease involving native coronary artery of native heart with angina pectoris (HCC)    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   PERTINENT HISTORY:  Coronary stent 12/2021,   PAIN:  Are you having pain? Yes: NPRS scale: 4/10 Pain location: back of the neck Pain description: dull, ache Aggravating factors: turning head Relieving factors: trying to move it   PRECAUTIONS: None  WEIGHT BEARING RESTRICTIONS: No  FALLS:  Has patient fallen in last 6 months? No  LIVING ENVIRONMENT: Lives with: lives with their spouse Lives in: House/apartment Stairs: Yes: External: 3 steps; on right going up Has following equipment at home: Single point cane  OCCUPATION: Retired  PLOF: Independent  PATIENT GOALS: to get rid of neck pain   NEXT MD VISIT:   OBJECTIVE:   DIAGNOSTIC FINDINGS:  CT CERVICAL SPINE FINDINGS   Alignment: Normal alignment of the cervical vertebral bodies.   Skull base and vertebrae: Normal craniocervical junction. No loss of vertebral body height or disc height. Normal facet articulation. No evidence of fracture.   Soft tissues and spinal canal: No prevertebral soft tissue swelling. No perispinal or epidural hematoma.   Disc levels:  Unremarkable   Upper chest: Clear   Other: subcutaneous bruising in the LEFT neck above the clavicle.   IMPRESSION: 1. No intracranial trauma. 2. No cervical spine fracture. 3. Subcutaneous bruising in the LEFT neck superior to the clavicle.    PATIENT SURVEYS:  FOTO 61  COGNITION: Overall cognitive status: Within functional limits for tasks assessed and History of cognitive impairments - at  baseline  SENSATION: WFL  POSTURE: rounded shoulders and forward  head  PALPATION: Tightness in bilateral upper traps and rhomboids, TTP with some jump signs with palpation around Rt UT and L rhomboid/medial border of L scapula  CERVICAL ROM:   Active ROM A/PROM (deg) eval  Flexion WFL  Extension Pain at end range  Right lateral flexion Very tight and w/pain  Left lateral flexion Very tight w/pain  Right rotation 50% w/pain  Left rotation 50% w/pain   (Blank rows = not tested)  UPPER EXTREMITY ROM: grossly WFL   UPPER EXTREMITY MMT: grossly 5/5   FUNCTIONAL TESTS:  5 times sit to stand: 12.45s Timed up and go (TUG): 12.07s  TODAY'S TREATMENT:                                                                                                                              DATE:  02/05/22 UBE L3 x79mns  Shoulder ext 10# 2x10 Cable rows 15# 2x10 Head against ball shoulder flexion and the abd 2# 2x10 Ball lift offs from wall yellow 2x10  STM followed by MH to cervical neck and upper traps 10 mins   01/28/22 NuStep L5 x667ms  Shoulder ER red 2x10  Horizontal ABD redTB 2x10 Rows and Lats 25# 2x10  Shoulder ext 10# 2x10  STM and passive stretching of upper traps and scalenes 30s  Chin tucks against band yellow 2x10  Shoulder flexion 3# 2x10 Lateral raises 3# 2x10  OHP yellow ball 2x10   01/22/22  NuStep L5 x 6 min DN performed By Lead PT M. Albright Shoulder ER red 2x10 Horiz Abd red 2x10 Seated rows Green 2x10 MHP cervical spine x10 min    PATIENT EDUCATION:  Education details: HEP and POC Person educated: Patient Education method: Explanation Education comprehension: verbalized understanding  HOME EXERCISE PROGRAM: Access Code: [email or web address]: [email or web address] Date: 01/16/2022 Prepared by: MoAndris BaumannExercises - Seated Upper Trapezius Stretch  - 2 x daily - 7 x weekly - 30 hold - Seated Levator Scapulae Stretch  - 2 x daily - 7 x weekly - 30 hold - Shoulder External Rotation and Scapular Retraction  with Resistance  - 1 x daily - 7 x weekly - 2 sets - 10 reps - Doorway Pec Stretch at 90 Degrees Abduction  - 2 x daily - 7 x weekly - 30 hold - Cervical Retraction with Resistance  - 1 x daily - 7 x weekly - 2 sets - 10 reps  ASSESSMENT:  CLINICAL IMPRESSION: Pt enters with reports of neck soreness. Postural cues needed with exercises. Jump signs with STM to R upper trap and rhomboid area, some trigger points noted and pain. Worked on some postural strengthening and neck.   OBJECTIVE IMPAIRMENTS: decreased ROM and pain.   REHAB POTENTIAL: Good  CLINICAL DECISION MAKING: Stable/uncomplicated  EVALUATION COMPLEXITY: Low  GOALS: Goals reviewed with patient? Yes  SHORT TERM GOALS: Target date: 02/20/22  Patient will  be independent with initial HEP.  Goal status: INITIAL   LONG TERM GOALS: Target date: 03/27/22  Patient will be independent with advanced/ongoing HEP to improve outcomes and carryover.  Goal status: INITIAL  2.  Patient will report 75% improvement in neck pain to improve QOL.  Baseline: 5/10 Goal status: INITIAL  3.  Patient will demonstrate full pain free cervical ROM for safety with driving.  Goal status: INITIAL  4.  Patient will report 63 on FOTO  to demonstrate improved functional ability.  Baseline: 61 Goal status: INITIAL  5.  Patient will demonstrate improved posture to decrease muscle imbalance. Baseline: increased tightness in c-spine Goal status: INITIAL  PLAN:  PT FREQUENCY: 1-2x/week  PT DURATION: 10 weeks  PLANNED INTERVENTIONS: Therapeutic exercises, Therapeutic activity, Neuromuscular re-education, Balance training, Gait training, Patient/Family education, Self Care, Joint mobilization, Dry Needling, Electrical stimulation, Cryotherapy, Moist heat, Traction, Ionotophoresis '4mg'$ /ml Dexamethasone, and Manual therapy  PLAN FOR NEXT SESSION: manual STM to c-spine, stretching, deep neck flexor strengthening.    [place], PT 02/05/2022,  1:57 PM

## 2022-02-05 ENCOUNTER — Ambulatory Visit: Payer: PPO

## 2022-02-05 DIAGNOSIS — M542 Cervicalgia: Secondary | ICD-10-CM | POA: Diagnosis not present

## 2022-02-05 DIAGNOSIS — G4486 Cervicogenic headache: Secondary | ICD-10-CM

## 2022-02-11 ENCOUNTER — Ambulatory Visit: Payer: PPO | Attending: Family Medicine | Admitting: Physical Therapy

## 2022-02-11 ENCOUNTER — Encounter: Payer: Self-pay | Admitting: Physical Therapy

## 2022-02-11 DIAGNOSIS — G4486 Cervicogenic headache: Secondary | ICD-10-CM | POA: Diagnosis not present

## 2022-02-11 DIAGNOSIS — M542 Cervicalgia: Secondary | ICD-10-CM | POA: Diagnosis not present

## 2022-02-11 DIAGNOSIS — R252 Cramp and spasm: Secondary | ICD-10-CM | POA: Insufficient documentation

## 2022-02-11 NOTE — Therapy (Signed)
OUTPATIENT PHYSICAL THERAPY CERVICAL TREATMENT   Patient Name: [name] MRN: [id number] DOB:1945/08/02, 76 y.o., male Today's Date: 02/11/2022   PT End of Session - 02/11/22 1259     Visit Number 5    Date for PT Re-Evaluation 03/27/22    PT Start Time 1300    PT Stop Time 4268    PT Time Calculation (min) 45 min    Activity Tolerance Patient tolerated treatment well    Behavior During Therapy [facility] for tasks assessed/performed              Past Medical History:  Diagnosis Date   Anginal pain ([place])    Arthritis    Basal cell carcinoma    back of neck   Blood transfusion without reported diagnosis 2006   during open heart surgery   CAD (coronary artery disease)    Diverticulosis 2013   Noted on Colonoscopy   Dyspnea    with activity and walking, sometime has SOB walking to mailbox and back to the house   GERD (gastroesophageal reflux disease)    History of colon polyps 2008   Noted on Colonoscopy   History of esophageal stricture 2003   Noted on EGD   Hyperlipidemia    Hypertension    IVCD (intraventricular conduction defect) 05/2018   Noted on EKG   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   Noted on EKG   Left axis deviation 05/2018   Noted on EKG   Lipoma 04/20/2018   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   Loop Biotronik Biomonitor III for syncope and collapse 04/09/2021 04/09/2021   LVH (left ventricular hypertrophy) 05/2018   Noted on EKG   Phimosis    Pneumonia    Tuberculosis 1956   Wears glasses    Wheezing    Past Surgical History:  Procedure Laterality Date   ANGIOPLASTY  2010   3 stents placed   BACK SURGERY     CATARACT EXTRACTION W/ INTRAOCULAR LENS  IMPLANT, BILATERAL  2012   bilateral   CIRCUMCISION N/A 08/30/2018   Procedure: CIRCUMCISION ADULT;  Surgeon: Kathie Rhodes, MD;  Location: [place];  Service: Urology;  Laterality: N/A;   COLONOSCOPY  2013, 2008   CORONARY ARTERY BYPASS GRAFT  2006   x4    CORONARY ATHERECTOMY N/A 12/24/2021   Procedure: CORONARY ATHERECTOMY;  Surgeon: [name], MD;  Location: [place] [facility];  Service: Cardiovascular;  Laterality: N/A;   CORONARY STENT INTERVENTION N/A 12/24/2021   Procedure: CORONARY STENT INTERVENTION;  Surgeon: [name], MD;  Location: [place] [facility];  Service: Cardiovascular;  Laterality: N/A;   ESOPHAGOGASTRODUODENOSCOPY  10/2011   EYE SURGERY     JOINT REPLACEMENT     KNEE ARTHROSCOPY  2001   LEFT HEART CATH AND CORS/GRAFTS ANGIOGRAPHY N/A 12/24/2021   Procedure: LEFT HEART CATH AND CORS/GRAFTS ANGIOGRAPHY;  Surgeon: [name], MD;  Location: [place] [facility];  Service: Cardiovascular;  Laterality: N/A;   LUMBAR [place] MICRODISCECTOMY N/A 07/13/2019   Procedure: Laminectomy and Foraminotomy - Lumbar Two-Lumbar Three - Lumbar Three-Lumbar Four;  Surgeon: Eustace Moore, MD;  Location: [place];  Service: Neurosurgery;  Laterality: N/A;  Laminectomy and Foraminotomy - Lumbar Two-Lumbar Three - Lumbar Three-Lumbar Four   ROTATOR CUFF REPAIR Left 05/24/2020   TENDON RECONSTRUCTION Right 10/27/2013   Procedure: RIGHT OPEN TRICEPS REPAIR;  Surgeon: Renette Butters, MD;  Location: [facility];  Service: Orthopedics;  Laterality: Right;   Patient Active Problem List   Diagnosis Date Noted   Status post coronary artery stent placement    Post PTCA 12/24/2021   Hx of CABG    Syncope and collapse 04/09/2021   Loop Biotronik Biomonitor III for syncope and collapse 04/09/2021 04/09/2021   S/P lumbar laminectomy 07/13/2019   Chest pain 03/16/2016   Constipation 03/16/2016   Hypertension    Hyperlipidemia    Coronary artery disease involving native coronary artery of native heart with angina pectoris (HCC)    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 Yes: NPRS scale: 4/10 Pain location: back of the neck Pain description: dull, ache Aggravating factors: turning head Relieving factors: trying to move it   PRECAUTIONS: None  WEIGHT BEARING RESTRICTIONS: No  FALLS:  Has patient fallen in last 6 months? No  LIVING ENVIRONMENT: Lives with: lives with their spouse Lives in: House/apartment Stairs: Yes: External: 3 steps; on right going up Has following equipment at home: Single point cane  OCCUPATION: Retired  PLOF: Independent  PATIENT GOALS: to get rid of neck pain   NEXT MD VISIT:   OBJECTIVE:   DIAGNOSTIC FINDINGS:  CT CERVICAL SPINE FINDINGS   Alignment: Normal alignment of the cervical vertebral bodies.   Skull base and vertebrae: Normal craniocervical junction. No loss of vertebral body height or disc height. Normal facet articulation. No evidence of fracture.   Soft tissues and spinal canal: No prevertebral soft tissue swelling. No perispinal or epidural hematoma.   Disc levels:  Unremarkable   Upper chest: Clear   Other: subcutaneous bruising in the LEFT neck above the clavicle.   IMPRESSION: 1. No intracranial trauma. 2. No cervical spine fracture. 3. Subcutaneous bruising in the LEFT neck superior to the clavicle.    PATIENT SURVEYS:  FOTO 61  COGNITION: Overall cognitive status: Within functional limits for  tasks assessed and History of cognitive impairments - at  baseline  SENSATION: WFL  POSTURE: rounded shoulders and forward head  PALPATION: Tightness in bilateral upper traps and rhomboids, TTP with some jump signs with palpation around Rt UT and L rhomboid/medial border of L scapula  CERVICAL ROM:   Active ROM A/PROM (deg) eval  Flexion WFL  Extension Pain at end range  Right lateral flexion Very tight and w/pain  Left lateral flexion Very tight w/pain  Right rotation 50% w/pain  Left rotation 50% w/pain   (Blank rows = not tested)  UPPER EXTREMITY ROM: grossly WFL   UPPER EXTREMITY MMT: grossly 5/5   FUNCTIONAL TESTS:  5 times sit to stand: 12.45s Timed up and go (TUG): 12.07s  TODAY'S TREATMENT:                                                                                                                              DATE:  02/11/22 NuStep L5 x 6 min UBE L 3 x 2 min each Shoulder ER red 2x12  Horiz abd shoulder red 2x10 Chin tucks 2x10 Shoulder Ext 10lb 2x10 STM followed by MH to cervical neck and upper traps 10 mins  02/05/22 UBE L3 x31mns  Shoulder ext 10# 2x10 Cable rows 15# 2x10 Head against ball shoulder flexion and the abd 2# 2x10 Ball lift offs from wall yellow 2x10  STM followed by MH to cervical neck and upper traps 10 mins   01/28/22 NuStep L5 x621ms  Shoulder ER red 2x10  Horizontal ABD redTB 2x10 Rows and Lats 25# 2x10  Shoulder ext 10# 2x10  STM and passive stretching of upper traps and scalenes 30s  Chin tucks against band yellow 2x10  Shoulder flexion 3# 2x10 Lateral raises 3# 2x10  OHP yellow ball 2x10   01/22/22  NuStep L5 x 6 min DN performed By Lead PT M. Albright Shoulder ER red 2x10 Horiz Abd red 2x10 Seated rows Green 2x10 MHP cervical spine x10 min    PATIENT EDUCATION:  Education details: HEP and POC Person educated: Patient Education method: Explanation Education comprehension: verbalized understanding  HOME EXERCISE PROGRAM: Access Code: [email or web address]:  [email or web address] Date: 01/16/2022 Prepared by: MoAndris BaumannExercises - Seated Upper Trapezius Stretch  - 2 x daily - 7 x weekly - 30 hold - Seated Levator Scapulae Stretch  - 2 x daily - 7 x weekly - 30 hold - Shoulder External Rotation and Scapular Retraction with Resistance  - 1 x daily - 7 x weekly - 2 sets - 10 reps - Doorway Pec Stretch at 90 Degrees Abduction  - 2 x daily - 7 x weekly - 30 hold - Cervical Retraction with Resistance  - 1 x daily - 7 x weekly - 2 sets - 10 reps  ASSESSMENT:  CLINICAL IMPRESSION: Pt enters with reports of neck soreness. Postural cues needed with shoulder extensions. Tactile cue needed to prevent posterior trunk leaning with chin  tucks. Increase soreness reported with STM to cervical spine  OBJECTIVE IMPAIRMENTS: decreased ROM and pain.   REHAB POTENTIAL: Good  CLINICAL DECISION MAKING: Stable/uncomplicated  EVALUATION COMPLEXITY: Low  GOALS: Goals reviewed with patient? Yes  SHORT TERM GOALS: Target date: 02/20/22  Patient will be independent with initial HEP.  Goal status: INITIAL   LONG TERM GOALS: Target date: 03/27/22  Patient will be independent with advanced/ongoing HEP to improve outcomes and carryover.  Goal status: INITIAL  2.  Patient will report 75% improvement in neck pain to improve QOL.  Baseline: 5/10 Goal status: INITIAL  3.  Patient will demonstrate full pain free cervical ROM for safety with driving.  Goal status: INITIAL  4.  Patient will report 8 on FOTO  to demonstrate improved functional ability.  Baseline: 61 Goal status: INITIAL  5.  Patient will demonstrate improved posture to decrease muscle imbalance. Baseline: increased tightness in c-spine Goal status: INITIAL  PLAN:  PT FREQUENCY: 1-2x/week  PT DURATION: 10 weeks  PLANNED INTERVENTIONS: Therapeutic exercises, Therapeutic activity, Neuromuscular re-education, Balance training, Gait training, Patient/Family education, Self  Care, Joint mobilization, Dry Needling, Electrical stimulation, Cryotherapy, Moist heat, Traction, Ionotophoresis '4mg'$ /ml Dexamethasone, and Manual therapy  PLAN FOR NEXT SESSION: manual STM to c-spine, stretching, deep neck flexor strengthening.    [place]ot [place], PTA 02/11/2022, 1:00 PM

## 2022-02-12 DIAGNOSIS — R55 Syncope and collapse: Secondary | ICD-10-CM | POA: Diagnosis not present

## 2022-02-12 DIAGNOSIS — Z4509 Encounter for adjustment and management of other cardiac device: Secondary | ICD-10-CM | POA: Diagnosis not present

## 2022-02-12 DIAGNOSIS — Z95818 Presence of other cardiac implants and grafts: Secondary | ICD-10-CM | POA: Diagnosis not present

## 2022-02-14 ENCOUNTER — Telehealth (HOSPITAL_COMMUNITY): Payer: Self-pay

## 2022-02-14 NOTE — Telephone Encounter (Signed)
Called patient to see if he was interested in participating in the Cardiac Rehab Program. Patient stated yes. Patient will come in for orientation on 03/06/22 @ 10:30AM and will attend the 10:15AM exercise class.   Tourist information centre manager.

## 2022-02-17 ENCOUNTER — Ambulatory Visit: Payer: PPO | Admitting: Physical Therapy

## 2022-02-17 DIAGNOSIS — J324 Chronic pansinusitis: Secondary | ICD-10-CM | POA: Diagnosis not present

## 2022-02-21 ENCOUNTER — Ambulatory Visit: Payer: PPO | Admitting: Physical Therapy

## 2022-02-21 ENCOUNTER — Encounter: Payer: Self-pay | Admitting: Physical Therapy

## 2022-02-21 DIAGNOSIS — S46012A Strain of muscle(s) and tendon(s) of the rotator cuff of left shoulder, initial encounter: Secondary | ICD-10-CM | POA: Diagnosis not present

## 2022-02-21 DIAGNOSIS — R252 Cramp and spasm: Secondary | ICD-10-CM

## 2022-02-21 DIAGNOSIS — G4486 Cervicogenic headache: Secondary | ICD-10-CM

## 2022-02-21 DIAGNOSIS — M542 Cervicalgia: Secondary | ICD-10-CM | POA: Diagnosis not present

## 2022-02-21 NOTE — Therapy (Signed)
OUTPATIENT PHYSICAL THERAPY CERVICAL TREATMENT   Patient Name: [name] MRN: [id number] DOB:09/23/1945, 76 y.o., male Today's Date: 02/21/2022   PT End of Session - 02/21/22 0759     Visit Number 6    Date for PT Re-Evaluation 03/27/22    PT Start Time 0800    PT Stop Time 0845    PT Time Calculation (min) 45 min    Activity Tolerance Patient tolerated treatment well    Behavior During Therapy [facility] for tasks assessed/performed              Past Medical History:  Diagnosis Date   Anginal pain ([place])    Arthritis    Basal cell carcinoma    back of neck   Blood transfusion without reported diagnosis 2006   during open heart surgery   CAD (coronary artery disease)    Diverticulosis 2013   Noted on Colonoscopy   Dyspnea    with activity and walking, sometime has SOB walking to mailbox and back to the house   GERD (gastroesophageal reflux disease)    History of colon polyps 2008   Noted on Colonoscopy   History of esophageal stricture 2003   Noted on EGD   Hyperlipidemia    Hypertension    IVCD (intraventricular conduction defect) 05/2018   Noted on EKG   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   Noted on EKG   Left axis deviation 05/2018   Noted on EKG   Lipoma 04/20/2018   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   Loop Biotronik Biomonitor III for syncope and collapse 04/09/2021 04/09/2021   LVH (left ventricular hypertrophy) 05/2018   Noted on EKG   Phimosis    Pneumonia    Tuberculosis 1956   Wears glasses    Wheezing    Past Surgical History:  Procedure Laterality Date   ANGIOPLASTY  2010   3 stents placed   BACK SURGERY     CATARACT EXTRACTION W/ [place], BILATERAL  2012   bilateral   CIRCUMCISION N/A 08/30/2018   Procedure: CIRCUMCISION ADULT;  Surgeon: Kathie Rhodes, MD;  Location: [place];  Service: Urology;  Laterality: N/A;   COLONOSCOPY  2013, 2008   CORONARY ARTERY BYPASS GRAFT  2006   x4    CORONARY ATHERECTOMY N/A 12/24/2021   Procedure: CORONARY ATHERECTOMY;  Surgeon: [name], MD;  Location: [place] [facility];  Service: Cardiovascular;  Laterality: N/A;   CORONARY STENT INTERVENTION N/A 12/24/2021   Procedure: CORONARY STENT INTERVENTION;  Surgeon: [name], MD;  Location: [place] [facility];  Service: Cardiovascular;  Laterality: N/A;   ESOPHAGOGASTRODUODENOSCOPY  10/2011   EYE SURGERY     JOINT REPLACEMENT     KNEE ARTHROSCOPY  2001   LEFT HEART CATH AND CORS/GRAFTS ANGIOGRAPHY N/A 12/24/2021   Procedure: LEFT HEART CATH AND CORS/GRAFTS ANGIOGRAPHY;  Surgeon: [name], MD;  Location: [place] [facility];  Service: Cardiovascular;  Laterality: N/A;   LUMBAR [place] MICRODISCECTOMY N/A 07/13/2019   Procedure: Laminectomy and Foraminotomy - Lumbar Two-Lumbar Three - Lumbar Three-Lumbar Four;  Surgeon: Eustace Moore, MD;  Location: [place];  Service: Neurosurgery;  Laterality: N/A;  Laminectomy and Foraminotomy - Lumbar Two-Lumbar Three - Lumbar Three-Lumbar Four   ROTATOR CUFF REPAIR Left 05/24/2020   TENDON RECONSTRUCTION Right 10/27/2013   Procedure: RIGHT OPEN TRICEPS REPAIR;  Surgeon: Renette Butters, MD;  Location:   [facility];  Service: Orthopedics;  Laterality: Right;   Patient Active Problem List   Diagnosis Date Noted   Status post coronary artery stent placement    Post PTCA 12/24/2021   Hx of CABG    Syncope and collapse 04/09/2021   Loop Biotronik Biomonitor III for syncope and collapse 04/09/2021 04/09/2021   S/P lumbar laminectomy 07/13/2019   Chest pain 03/16/2016   Constipation 03/16/2016   Hypertension    Hyperlipidemia    Coronary artery disease involving native coronary artery of native heart with angina pectoris (HCC)    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 Yes: NPRS scale: 2/10 Pain location: back of the neck Pain description: dull, ache Aggravating factors: turning head Relieving factors: trying to move it   PRECAUTIONS: None  WEIGHT BEARING RESTRICTIONS: No  FALLS:  Has patient fallen in last 6 months? No  LIVING ENVIRONMENT: Lives with: lives with their spouse Lives in: House/apartment Stairs: Yes: External: 3 steps; on right going up Has following equipment at home: Single point cane  OCCUPATION: Retired  PLOF: Independent  PATIENT GOALS: to get rid of neck pain   NEXT MD VISIT:   OBJECTIVE:   DIAGNOSTIC FINDINGS:  CT CERVICAL SPINE FINDINGS   Alignment: Normal alignment of the cervical vertebral bodies.   Skull base and vertebrae: Normal craniocervical junction. No loss of vertebral body height or disc height. Normal facet articulation. No evidence of fracture.   Soft tissues and spinal canal: No prevertebral soft tissue swelling. No perispinal or epidural hematoma.   Disc levels:  Unremarkable   Upper chest: Clear   Other: subcutaneous bruising in the LEFT neck above the clavicle.   IMPRESSION: 1. No intracranial trauma. 2. No cervical spine fracture. 3. Subcutaneous bruising in the LEFT neck superior to the clavicle.    PATIENT SURVEYS:  FOTO 61  COGNITION: Overall cognitive status: Within functional limits for tasks  assessed and History of cognitive impairments - at  baseline  SENSATION: WFL  POSTURE: rounded shoulders and forward head  PALPATION: Tightness in bilateral upper traps and rhomboids, TTP with some jump signs with palpation around Rt UT and L rhomboid/medial border of L scapula  CERVICAL ROM:   Active ROM A/PROM (deg) eval AROM  Flexion WFL WFL  Extension Pain at end range [facility]  Right lateral flexion Very tight and w/pain Limited 50%  Left lateral flexion Very tight w/pain WFL  Right rotation 50% w/pain Limited 25%  Left rotation 50% w/pain WFL   (Blank rows = not tested)  UPPER EXTREMITY ROM: grossly WFL   UPPER EXTREMITY MMT: grossly 5/5   FUNCTIONAL TESTS:  5 times sit to stand: 12.45s Timed up and go (TUG): 12.07s  TODAY'S TREATMENT:                                                                                                                              DATE:  02/21/22 NuStep L5 x 5 min UBE L3 x 2 min each Rows & Lats 35lb 2x10 Shoulder Ext 10lb 2x10 Chin tucks 2x10 Seated OHP blue ball 2x10  Horiz Abd green 2x10 ER green 2x10 Shoulder Abd 3lb 2x10   02/11/22 NuStep L5 x 6 min UBE L 3 x 2 min each Shoulder ER red 2x12  Horiz abd shoulder red 2x10 Chin tucks 2x10 Shoulder Ext 10lb 2x10 STM followed by MH to cervical neck and upper traps 10 mins  02/05/22 UBE L3 x19mns  Shoulder ext 10# 2x10 Cable rows 15# 2x10 Head against ball shoulder flexion and the abd 2# 2x10 Ball lift offs from wall yellow 2x10  STM followed by MH to cervical neck and upper traps 10 mins   01/28/22 NuStep L5 x670ms  Shoulder ER red 2x10  Horizontal ABD redTB 2x10 Rows and Lats 25# 2x10  Shoulder ext 10# 2x10  STM and passive stretching of upper traps and scalenes 30s  Chin tucks against band yellow 2x10  Shoulder flexion 3# 2x10 Lateral raises 3# 2x10  OHP yellow ball 2x10   01/22/22  NuStep L5 x 6 min DN performed By Lead PT M. Albright Shoulder ER red 2x10 Horiz Abd red 2x10 Seated rows Green 2x10 MHP  cervical spine x10 min    PATIENT EDUCATION:  Education details: HEP and POC Person educated: Patient Education method: Explanation Education comprehension: verbalized understanding  HOME EXERCISE PROGRAM: Access Code: [email or web address]: [email or web address] Date: 01/16/2022 Prepared by: MoAndris BaumannExercises - Seated Upper Trapezius Stretch  - 2 x daily - 7 x weekly - 30 hold - Seated Levator Scapulae Stretch  - 2 x daily - 7 x weekly - 30 hold - Shoulder External Rotation and Scapular Retraction with Resistance  - 1 x daily - 7 x weekly - 2 sets - 10 reps - Doorway Pec Stretch at 90 Degrees Abduction  - 2 x daily - 7 x weekly -  30 hold - Cervical Retraction with Resistance  - 1 x daily - 7 x weekly - 2 sets - 10 reps  ASSESSMENT:  CLINICAL IMPRESSION: Pt enters with reports of little neck soreness. He ha sprogressed increasing his cervical ROM and decreased cervical pain. Postural cues needed with shoulder extensions. Tactile cue needed to prevent posterior trunk leaning with chin tucks.   OBJECTIVE IMPAIRMENTS: decreased ROM and pain.   REHAB POTENTIAL: Good  CLINICAL DECISION MAKING: Stable/uncomplicated  EVALUATION COMPLEXITY: Low  GOALS: Goals reviewed with patient? Yes  SHORT TERM GOALS: Target date: 02/20/22  Patient will be independent with initial HEP.  Goal status: Met   LONG TERM GOALS: Target date: 03/27/22  Patient will be independent with advanced/ongoing HEP to improve outcomes and carryover.  Goal status: INITIAL  2.  Patient will report 75% improvement in neck pain to improve QOL.  Baseline: 5/10 Goal status: Met 80% 02/21/22  3.  Patient will demonstrate full pain free cervical ROM for safety with driving.  Goal status: Progressing 02/21/22  4.  Patient will report 52 on FOTO  to demonstrate improved functional ability.  Baseline: 61 Goal status: INITIAL  5.  Patient will demonstrate improved posture to decrease muscle  imbalance. Baseline: increased tightness in c-spine Goal status: INITIAL  PLAN:  PT FREQUENCY: 1-2x/week  PT DURATION: 10 weeks  PLANNED INTERVENTIONS: Therapeutic exercises, Therapeutic activity, Neuromuscular re-education, Balance training, Gait training, Patient/Family education, Self Care, Joint mobilization, Dry Needling, Electrical stimulation, Cryotherapy, Moist heat, Traction, Ionotophoresis 49m/ml Dexamethasone, and Manual therapy  PLAN FOR NEXT SESSION: manual STM to c-spine, stretching, deep neck flexor strengthening.    RScot Jun PTA 02/21/2022, 7:59 AM

## 2022-02-24 ENCOUNTER — Ambulatory Visit: Payer: PPO

## 2022-02-24 DIAGNOSIS — M542 Cervicalgia: Secondary | ICD-10-CM | POA: Diagnosis not present

## 2022-02-24 DIAGNOSIS — G4486 Cervicogenic headache: Secondary | ICD-10-CM

## 2022-02-24 DIAGNOSIS — R252 Cramp and spasm: Secondary | ICD-10-CM

## 2022-02-24 NOTE — Therapy (Signed)
OUTPATIENT PHYSICAL THERAPY CERVICAL TREATMENT   Patient Name: [name] MRN: [id number] DOB:June 16, 1945, 76 y.o., male Today's Date: 02/24/2022   PT End of Session - 02/24/22 1312     Visit Number 7    Date for PT Re-Evaluation 03/27/22    PT Start Time 1315    PT Stop Time 1400    PT Time Calculation (min) 45 min    Activity Tolerance Patient tolerated treatment well    Behavior During Therapy [facility] for tasks assessed/performed               Past Medical History:  Diagnosis Date   Anginal pain ([place])    Arthritis    Basal cell carcinoma    back of neck   Blood transfusion without reported diagnosis 2006   during open heart surgery   CAD (coronary artery disease)    Diverticulosis 2013   Noted on Colonoscopy   Dyspnea    with activity and walking, sometime has SOB walking to mailbox and back to the house   GERD (gastroesophageal reflux disease)    History of colon polyps 2008   Noted on Colonoscopy   History of esophageal stricture 2003   Noted on EGD   Hyperlipidemia    Hypertension    IVCD (intraventricular conduction defect) 05/2018   Noted on EKG   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   Noted on EKG   Left axis deviation 05/2018   Noted on EKG   Lipoma 04/20/2018   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   Loop Biotronik Biomonitor III for syncope and collapse 04/09/2021 04/09/2021   LVH (left ventricular hypertrophy) 05/2018   Noted on EKG   Phimosis    Pneumonia    Tuberculosis 1956   Wears glasses    Wheezing    Past Surgical History:  Procedure Laterality Date   ANGIOPLASTY  2010   3 stents placed   BACK SURGERY     CATARACT EXTRACTION W/ INTRAOCULAR LENS  IMPLANT, BILATERAL  2012   bilateral   CIRCUMCISION N/A 08/30/2018   Procedure: CIRCUMCISION ADULT;  Surgeon: Kathie Rhodes, MD;  Location: [place];  Service: Urology;  Laterality: N/A;   COLONOSCOPY  2013, 2008   CORONARY ARTERY BYPASS GRAFT  2006    x4   CORONARY ATHERECTOMY N/A 12/24/2021   Procedure: CORONARY ATHERECTOMY;  Surgeon: [name], MD;  Location: [place] [facility];  Service: Cardiovascular;  Laterality: N/A;   CORONARY STENT INTERVENTION N/A 12/24/2021   Procedure: CORONARY STENT INTERVENTION;  Surgeon: [name], MD;  Location: [place] [facility];  Service: Cardiovascular;  Laterality: N/A;   ESOPHAGOGASTRODUODENOSCOPY  10/2011   EYE SURGERY     JOINT REPLACEMENT     KNEE ARTHROSCOPY  2001   LEFT HEART CATH AND CORS/GRAFTS ANGIOGRAPHY N/A 12/24/2021   Procedure: LEFT HEART CATH AND CORS/GRAFTS ANGIOGRAPHY;  Surgeon: [name], MD;  Location: [place] [facility];  Service: Cardiovascular;  Laterality: N/A;   LUMBAR [place] MICRODISCECTOMY N/A 07/13/2019   Procedure: Laminectomy and Foraminotomy - Lumbar Two-Lumbar Three - Lumbar Three-Lumbar Four;  Surgeon: Eustace Moore, MD;  Location: [place];  Service: Neurosurgery;  Laterality: N/A;  Laminectomy and Foraminotomy - Lumbar Two-Lumbar Three - Lumbar Three-Lumbar Four   ROTATOR CUFF REPAIR Left 05/24/2020   TENDON RECONSTRUCTION Right 10/27/2013   Procedure: RIGHT OPEN TRICEPS REPAIR;  Surgeon: Renette Butters, MD;  Location: [place]  [place];  Service: Orthopedics;  Laterality: Right;   Patient Active Problem List   Diagnosis Date Noted   Status post coronary artery stent placement    Post PTCA 12/24/2021   Hx of CABG    Syncope and collapse 04/09/2021   Loop Biotronik Biomonitor III for syncope and collapse 04/09/2021 04/09/2021   S/P lumbar laminectomy 07/13/2019   Chest pain 03/16/2016   Constipation 03/16/2016   Hypertension    Hyperlipidemia    Coronary artery disease involving native coronary artery of native heart with angina pectoris (HCC)    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 Yes: NPRS scale: 0/10 Pain location: back of the neck Pain description: dull, ache Aggravating factors: turning head Relieving factors: trying to move it   PRECAUTIONS: None  WEIGHT BEARING RESTRICTIONS: No  FALLS:  Has patient fallen in last 6 months? No  LIVING ENVIRONMENT: Lives with: lives with their spouse Lives in: House/apartment Stairs: Yes: External: 3 steps; on right going up Has following equipment at home: Single point cane  OCCUPATION: Retired  PLOF: Independent  PATIENT GOALS: to get rid of neck pain   NEXT MD VISIT:   OBJECTIVE:   DIAGNOSTIC FINDINGS:  CT CERVICAL SPINE FINDINGS   Alignment: Normal alignment of the cervical vertebral bodies.   Skull base and vertebrae: Normal craniocervical junction. No loss of vertebral body height or disc height. Normal facet articulation. No evidence of fracture.   Soft tissues and spinal canal: No prevertebral soft tissue swelling. No perispinal or epidural hematoma.   Disc levels:  Unremarkable   Upper chest: Clear   Other: subcutaneous bruising in the LEFT neck above the clavicle.   IMPRESSION: 1. No intracranial trauma. 2. No cervical spine fracture. 3. Subcutaneous bruising in the LEFT neck superior to the clavicle.    PATIENT SURVEYS:  FOTO 61  COGNITION: Overall cognitive status: Within functional limits for tasks assessed and History  of cognitive impairments - at  baseline  SENSATION: WFL  POSTURE: rounded shoulders and forward head  PALPATION: Tightness in bilateral upper traps and rhomboids, TTP with some jump signs with palpation around Rt UT and L rhomboid/medial border of L scapula  CERVICAL ROM:   Active ROM A/PROM (deg) eval AROM  Flexion WFL WFL  Extension Pain at end range [facility]  Right lateral flexion Very tight and w/pain Limited 50%  Left lateral flexion Very tight w/pain WFL  Right rotation 50% w/pain Limited 25%  Left rotation 50% w/pain WFL   (Blank rows = not tested)  UPPER EXTREMITY ROM: grossly WFL   UPPER EXTREMITY MMT: grossly 5/5   FUNCTIONAL TESTS:  5 times sit to stand: 12.45s Timed up and go (TUG): 12.07s  TODAY'S TREATMENT:                                                                                                                              DATE:  02/24/22 UBE L3 x50mns Rows and Lats 35# 2x10 Shoulder ext 10# 2x10 Cable rows 15# 2x10 Chin tucks against red band 2x10  Shoulder flexion 4# 2x10 Shoulder abd 4# 2x10 Shoulder shrugs 4# 2x10 Horizontal abd green 2x10 STM and passive stretching to neck   02/21/22 NuStep L5 x 5 min UBE L3 x 2 min each Rows & Lats 35lb 2x10 Shoulder Ext 10lb 2x10 Chin tucks 2x10 Seated OHP blue ball 2x10  Horiz Abd green 2x10 ER green 2x10 Shoulder Abd 3lb 2x10   02/11/22 NuStep L5 x 6 min UBE L 3 x 2 min each Shoulder ER red 2x12  Horiz abd shoulder red 2x10 Chin tucks 2x10 Shoulder Ext 10lb 2x10 STM followed by MH to cervical neck and upper traps 10 mins  02/05/22 UBE L3 x629ms  Shoulder ext 10# 2x10 Cable rows 15# 2x10 Head against ball shoulder flexion and the abd 2# 2x10 Ball lift offs from wall yellow 2x10  STM followed by MH to cervical neck and upper traps 10 mins   01/28/22 NuStep L5 x6m41m  Shoulder ER red 2x10  Horizontal ABD redTB 2x10 Rows and Lats 25# 2x10  Shoulder ext 10# 2x10  STM and passive stretching of upper traps and  scalenes 30s  Chin tucks against band yellow 2x10  Shoulder flexion 3# 2x10 Lateral raises 3# 2x10  OHP yellow ball 2x10   01/22/22  NuStep L5 x 6 min DN performed By Lead PT M. Albright Shoulder ER red 2x10 Horiz Abd red 2x10 Seated rows Green 2x10 MHP cervical spine x10 min    PATIENT EDUCATION:  Education details: HEP and POC Person educated: Patient Education method: Explanation Education comprehension: verbalized understanding  HOME EXERCISE PROGRAM: Access Code: [email or web address]: [email or web address] Date: 01/16/2022 Prepared by: MonAndris Baumannxercises - Seated Upper Trapezius Stretch  - 2 x daily - 7 x weekly - 30 hold - Seated Levator Scapulae Stretch  - 2 x daily - 7 x  weekly - 30 hold - Shoulder External Rotation and Scapular Retraction with Resistance  - 1 x daily - 7 x weekly - 2 sets - 10 reps - Doorway Pec Stretch at 90 Degrees Abduction  - 2 x daily - 7 x weekly - 30 hold - Cervical Retraction with Resistance  - 1 x daily - 7 x weekly - 2 sets - 10 reps  ASSESSMENT:  CLINICAL IMPRESSION: Pt enters with reports of little neck soreness. He ha sprogressed increasing his cervical ROM and decreased cervical pain. Postural cues needed with shoulder extensions. Tactile cue needed to prevent posterior trunk leaning with chin tucks.   OBJECTIVE IMPAIRMENTS: decreased ROM and pain.   REHAB POTENTIAL: Good  CLINICAL DECISION MAKING: Stable/uncomplicated  EVALUATION COMPLEXITY: Low  GOALS: Goals reviewed with patient? Yes  SHORT TERM GOALS: Target date: 02/20/22  Patient will be independent with initial HEP.  Goal status: Met   LONG TERM GOALS: Target date: 03/27/22  Patient will be independent with advanced/ongoing HEP to improve outcomes and carryover.  Goal status: INITIAL  2.  Patient will report 75% improvement in neck pain to improve QOL.  Baseline: 5/10 Goal status: Met 80% 02/21/22  3.  Patient will demonstrate full pain free  cervical ROM for safety with driving.  Goal status: Progressing 02/21/22  4.  Patient will report 70 on FOTO  to demonstrate improved functional ability.  Baseline: 61 Goal status: INITIAL  5.  Patient will demonstrate improved posture to decrease muscle imbalance. Baseline: increased tightness in c-spine Goal status: INITIAL  PLAN:  PT FREQUENCY: 1-2x/week  PT DURATION: 10 weeks  PLANNED INTERVENTIONS: Therapeutic exercises, Therapeutic activity, Neuromuscular re-education, Balance training, Gait training, Patient/Family education, Self Care, Joint mobilization, Dry Needling, Electrical stimulation, Cryotherapy, Moist heat, Traction, Ionotophoresis 66m/ml Dexamethasone, and Manual therapy  PLAN FOR NEXT SESSION: manual STM to c-spine, stretching, deep neck flexor strengthening.    MAi PT 02/24/2022, 1:55 PM
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
## 2022-02-28 ENCOUNTER — Ambulatory Visit: Payer: PPO | Admitting: Cardiology

## 2022-02-28 ENCOUNTER — Ambulatory Visit (INDEPENDENT_AMBULATORY_CARE_PROVIDER_SITE_OTHER): Payer: PPO | Admitting: Family Medicine

## 2022-02-28 VITALS — BP 128/64 | Ht 68.0 in | Wt 186.0 lb

## 2022-02-28 DIAGNOSIS — S060X0D Concussion without loss of consciousness, subsequent encounter: Secondary | ICD-10-CM | POA: Diagnosis not present

## 2022-02-28 DIAGNOSIS — M542 Cervicalgia: Secondary | ICD-10-CM

## 2022-02-28 NOTE — Patient Instructions (Signed)
Thank you for coming in today.   Return as needed.   Continue home exercises.

## 2022-03-06 ENCOUNTER — Encounter (HOSPITAL_COMMUNITY)
Admission: RE | Admit: 2022-03-06 | Discharge: 2022-03-06 | Disposition: A | Discharge status: Home / Self Care | Payer: PPO | Source: Ambulatory Visit | Attending: Cardiology | Admitting: Cardiology

## 2022-03-06 ENCOUNTER — Encounter (HOSPITAL_COMMUNITY): Payer: Self-pay

## 2022-03-06 VITALS — BP 110/60 | HR 69 | Ht 66.0 in | Wt 185.8 lb

## 2022-03-06 DIAGNOSIS — Z955 Presence of coronary angioplasty implant and graft: Secondary | ICD-10-CM | POA: Diagnosis not present

## 2022-03-06 HISTORY — DX: Syncope and collapse: R55

## 2022-03-06 NOTE — Progress Notes (Signed)
Cardiac Individual Treatment Plan  Patient Details  Name: SHERMON BOZZI MRN: [id number] Date of Birth: 1945/12/03 Referring Provider:   Flowsheet Row INTENSIVE CARDIAC REHAB ORIENT from 03/06/2022 in [facility] [place]  Referring Provider [name], MD       Initial Encounter Date:  Annex from 03/06/2022 in [facility]  Date 03/06/22       Visit Diagnosis: 12/24/2021 DES x 2 Cx/LM  Patient's Home Medications on Admission:  Current Outpatient Medications:    acetaminophen (TYLENOL) 500 MG tablet, Take 1,000 mg by mouth every morning. Additional 1000 mg if needed during the day, Disp: , Rfl:    aspirin EC 81 MG tablet, Take 81 mg by mouth every evening. Swallow whole., Disp: , Rfl:    Calcium Carbonate Antacid (TUMS ULTRA PO), Take 2 tablets by mouth daily as needed (acid reflux/indigestion.)., Disp: , Rfl:    clopidogrel (PLAVIX) 75 MG tablet, Take 1 tablet (75 mg total) by mouth daily with breakfast., Disp: 90 tablet, Rfl: 1   Coenzyme Q10 (COQ10) 100 MG CAPS, Take 100 mg by mouth 2 (two) times daily., Disp: , Rfl:    Cyanocobalamin (VITAMIN B-12) 5000 MCG TBDP, Take 5,000 mcg by mouth every evening., Disp: , Rfl:    ezetimibe (ZETIA) 10 MG tablet, TAKE 1/2 TABLET BY MOUTH EVERY EVENING, Disp: 45 tablet, Rfl: 11   hydrALAZINE (APRESOLINE) 25 MG tablet, Take 1 tablet (25 mg total) by mouth 3 (three) times daily as needed (For SBP >140 mm Hg)., Disp: 270 tablet, Rfl: 3   losartan (COZAAR) 25 MG tablet, TAKE 1/2 TABLET BY MOUTH EVERY EVENING, Disp: 90 tablet, Rfl: 3   metoprolol succinate (TOPROL-XL) 25 MG 24 hr tablet, Take 0.5 tablets (12.5 mg total) by mouth every morning., Disp: 90 tablet, Rfl: 3   Multiple Vitamin (MULTIVITAMIN) tablet, Take 1 tablet by mouth in the morning. Seniors, Disp: , Rfl:    Multiple Vitamins-Minerals  (PRESERVISION AREDS 2) CAPS, Take 1 capsule by mouth 2 (two) times daily., Disp: , Rfl:    nitroGLYCERIN (NITROSTAT) 0.4 MG SL tablet, TAKE 1 TABLET EVERY 5 MINUTES AS NEEDED FOR CHEST PAIN. (Patient taking differently: Place 0.4 mg under the tongue every 5 (five) minutes as needed for chest pain.), Disp: 25 tablet, Rfl: 3   nortriptyline (PAMELOR) 25 MG capsule, TAKE 1 CAPSULE BY MOUTH AT BEDTIME. (Patient taking differently: Take 25 mg by mouth at bedtime as needed (Headache).), Disp: 90 capsule, Rfl: 1   omega-3 acid ethyl esters (LOVAZA) 1 g capsule, Take 1 g by mouth 2 (two) times daily., Disp: , Rfl:    pantoprazole (PROTONIX) 40 MG tablet, Take 1 tablet (40 mg total) by mouth daily., Disp: 30 tablet, Rfl: 1   Polyethyl Glycol-Propyl Glycol (SYSTANE) 0.4-0.3 % GEL ophthalmic gel, Place 1 application into both eyes at bedtime., Disp: , Rfl:    Propylene Glycol (SYSTANE BALANCE) 0.6 % SOLN, Place 1 drop into both eyes 3 (three) times daily as needed (dry/irritated eyes.)., Disp: , Rfl:    pyridoxine (B-6) 100 MG tablet, Take 100 mg by mouth every evening., Disp: , Rfl:    rosuvastatin (CRESTOR) 20 MG tablet, TAKE ONE TABLET BY MOUTH EVERYDAY AT BEDTIME, Disp: 90 tablet, Rfl: 3   vitamin C (ASCORBIC ACID) 500 MG tablet, Take 500 mg by mouth daily., Disp: , Rfl:   Past Medical History:  Past Medical History:  Diagnosis Date   Anginal pain ([place])    Arthritis    Basal cell carcinoma    back of neck   Blood transfusion without reported diagnosis 2006   during open heart surgery   CAD (coronary artery disease)    Diverticulosis 2013   Noted on Colonoscopy   Dyspnea    with activity and walking, sometime has SOB walking to mailbox and back to the house   GERD (gastroesophageal reflux disease)    History of colon polyps 2008   Noted on Colonoscopy   History of esophageal stricture 2003   Noted on EGD   Hyperlipidemia    Hypertension    IVCD (intraventricular conduction defect) 05/2018    Noted on EKG   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   Noted on EKG   Left axis deviation 05/2018   Noted on EKG   Lipoma 04/20/2018   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   Loop Biotronik Biomonitor III for syncope and collapse 04/09/2021 04/09/2021   LVH (left ventricular hypertrophy) 05/2018   Noted on EKG   Phimosis    Pneumonia    Syncope and collapse    Tuberculosis 1956   Wears glasses    Wheezing     Tobacco Use: Social History   Tobacco Use  Smoking Status Never  Smokeless Tobacco Never    Labs: Review Flowsheet       Latest Ref Rng & Units 10/27/2013 03/16/2016 09/11/2021  Labs for ITP Cardiac and Pulmonary Rehab  Cholestrol 0 - 200 mg/dL - 84  -  LDL (calc) 0 - 99 mg/dL - 39  -  HDL-C >40 mg/dL - 32  -  Trlycerides <150 mg/dL - 67  -  TCO2 22 - 32 mmol/L 26  - 24     Capillary Blood Glucose: No results found for: "GLUCAP"   Exercise Target Goals: Exercise Program Goal: Individual exercise prescription set using results from initial 6 min walk test and THRR while considering  patient's activity barriers and safety.   Exercise Prescription Goal: Initial exercise prescription builds to 30-45 minutes a day of aerobic activity, 2-3 days per week.  Home exercise guidelines will be given to patient during program as part of exercise prescription that the participant will acknowledge.  Activity Barriers & Risk Stratification:  Activity Barriers & Cardiac Risk Stratification - 03/06/22 1226       Activity Barriers & Cardiac Risk Stratification   Activity Barriers Arthritis;Back Problems;Neck/Spine Problems;Joint Problems;Deconditioning;Muscular Weakness;Shortness of Breath;Chest Pain/Angina;Balance Concerns;History of Falls;Assistive Device    Cardiac Risk Stratification High             6 Minute Walk:  6 Minute Walk     Row Name 03/06/22 1128         6 Minute Walk   Phase Initial     Distance 1542 feet  Nustep used /chronic low back  and hip pain     Walk Time 6 minutes  .60 km (1542 ft) - total distance from nustep     # of Rest Breaks 0     MPH 2.92     METS 21     RPE 11     Perceived Dyspnea  1     VO2 Peak 8.95     Symptoms Yes (comment)     Comments Headache, chronic low back pain/bilateral hip pain 3-4/10     Resting HR 71 bpm     Resting  BP 110/60     Resting Oxygen Saturation  99 %     Exercise Oxygen Saturation  during 6 min walk 96 %     Max Ex. HR 74 bpm     Max Ex. BP 136/60     2 Minute Post BP 112/66              Oxygen Initial Assessment:   Oxygen Re-Evaluation:   Oxygen Discharge (Final Oxygen Re-Evaluation):   Initial Exercise Prescription:  Initial Exercise Prescription - 03/06/22 1300       Date of Initial Exercise RX and Referring Provider   Date 03/06/22    Referring Provider [name], MD    Expected Discharge Date 05/02/22      NuStep   Level 1    SPM 80    Minutes 15    METs 2.6      Prescription Details   Frequency (times per week) 3    Duration Progress to 30 minutes of continuous aerobic without signs/symptoms of physical distress      Intensity   THRR 40-80% of Max Heartrate 57-114    Ratings of Perceived Exertion 11-13    Perceived Dyspnea 0-4      Progression   Progression Continue progressive overload as per policy without signs/symptoms or physical distress.      Resistance Training   Training Prescription Yes    Weight 2 lbs    Reps 10-15             Perform Capillary Blood Glucose checks as needed.  Exercise Prescription Changes:   Exercise Comments:   Exercise Goals and Review:   Exercise Goals     Row Name 03/06/22 1226             Exercise Goals   Increase Physical Activity Yes       Intervention Provide advice, education, support and counseling about physical activity/exercise needs.;Develop an individualized exercise prescription for aerobic and resistive training based on initial evaluation findings, risk  stratification, comorbidities and participant's personal goals.       Expected Outcomes Short Term: Attend rehab on a regular basis to increase amount of physical activity.;Long Term: Add in home exercise to make exercise part of routine and to increase amount of physical activity.;Long Term: Exercising regularly at least 3-5 days a week.       Increase Strength and Stamina Yes       Intervention Provide advice, education, support and counseling about physical activity/exercise needs.;Develop an individualized exercise prescription for aerobic and resistive training based on initial evaluation findings, risk stratification, comorbidities and participant's personal goals.       Expected Outcomes Short Term: Increase workloads from initial exercise prescription for resistance, speed, and METs.;Long Term: Improve cardiorespiratory fitness, muscular endurance and strength as measured by increased METs and functional capacity (6MWT);Short Term: Perform resistance training exercises routinely during rehab and add in resistance training at home       Able to understand and use rate of perceived exertion (RPE) scale Yes       Intervention Provide education and explanation on how to use RPE scale       Expected Outcomes Short Term: Able to use RPE daily in rehab to express subjective intensity level;Long Term:  Able to use RPE to guide intensity level when exercising independently       Knowledge and understanding of Target Heart Rate Range (THRR) Yes       Intervention Provide education  and explanation of THRR including how the numbers were predicted and where they are located for reference       Expected Outcomes Short Term: Able to state/look up THRR;Short Term: Able to use daily as guideline for intensity in rehab;Long Term: Able to use THRR to govern intensity when exercising independently       Understanding of Exercise Prescription Yes       Intervention Provide education, explanation, and written  materials on patient's individual exercise prescription       Expected Outcomes Short Term: Able to explain program exercise prescription;Long Term: Able to explain home exercise prescription to exercise independently                Exercise Goals Re-Evaluation :   Discharge Exercise Prescription (Final Exercise Prescription Changes):   Nutrition:  Target Goals: Understanding of nutrition guidelines, daily intake of sodium '1500mg'$ , cholesterol '200mg'$ , calories 30% from fat and 7% or less from saturated fats, daily to have 5 or more servings of fruits and vegetables.  Biometrics:  Pre Biometrics - 03/06/22 1045       Pre Biometrics   Waist Circumference 43 inches    Hip Circumference 41.5 inches    Waist to Hip Ratio 1.04 %    Triceps Skinfold 13 mm    % Body Fat 29.8 %    Grip Strength 29 kg    Flexibility --   Not done due to back problems/chronic back pain   Single Leg Stand --   not performed. Pt with cane, chronic back/hip pain             Nutrition Therapy Plan and Nutrition Goals:   Nutrition Assessments:  MEDIFICTS Score Key: ?70 Need to make dietary changes  40-70 Heart Healthy Diet ? 40 Therapeutic Level Cholesterol Diet    Picture Your Plate Scores: <16 Unhealthy dietary pattern with much room for improvement. 41-50 Dietary pattern unlikely to meet recommendations for good health and room for improvement. 51-60 More healthful dietary pattern, with some room for improvement.  >60 Healthy dietary pattern, although there may be some specific behaviors that could be improved.    Nutrition Goals Re-Evaluation:   Nutrition Goals Re-Evaluation:   Nutrition Goals Discharge (Final Nutrition Goals Re-Evaluation):   Psychosocial: Target Goals: Acknowledge presence or absence of significant depression and/or stress, maximize coping skills, provide positive support system. Participant is able to verbalize types and ability to use techniques and skills  needed for reducing stress and depression.  Initial Review & Psychosocial Screening:  Initial Psych Review & Screening - 03/06/22 1409       Initial Review   Current issues with None Identified      Family Dynamics   Good Support System? Yes   Pt has wife for support     Barriers   Psychosocial barriers to participate in program There are no identifiable barriers or psychosocial needs.      Screening Interventions   Interventions Encouraged to exercise             Quality of Life Scores:  Quality of Life - 03/06/22 1347       Quality of Life   Select Quality of Life      Quality of Life Scores   Health/Function Pre 21.3 %    Socioeconomic Pre 28.33 %    Psych/Spiritual Pre 28.21 %    Family Pre 26.4 %    GLOBAL Pre 24.82 %  Scores of 19 and below usually indicate a poorer quality of life in these areas.  A difference of  2-3 points is a clinically meaningful difference.  A difference of 2-3 points in the total score of the Quality of Life Index has been associated with significant improvement in overall quality of life, self-image, physical symptoms, and general health in studies assessing change in quality of life.  PHQ-9: Review Flowsheet       03/06/2022  Depression screen PHQ 2/9  Decreased Interest 2  Down, Depressed, Hopeless 0  PHQ - 2 Score 2  Altered sleeping 0  Tired, decreased energy 1  Change in appetite 1  Feeling bad or failure about yourself  0  Trouble concentrating 0  Moving slowly or fidgety/restless 1  Suicidal thoughts 0  PHQ-9 Score 5  Difficult doing work/chores Not difficult at all   Interpretation of Total Score  Total Score Depression Severity:  1-4 = Minimal depression, 5-9 = Mild depression, 10-14 = Moderate depression, 15-19 = Moderately severe depression, 20-27 = Severe depression   Psychosocial Evaluation and Intervention:   Psychosocial Re-Evaluation:   Psychosocial Discharge (Final Psychosocial  Re-Evaluation):   Vocational Rehabilitation: Provide vocational rehab assistance to qualifying candidates.   Vocational Rehab Evaluation & Intervention:  Vocational Rehab - 03/06/22 1409       Initial Vocational Rehab Evaluation & Intervention   Assessment shows need for Vocational Rehabilitation No   Pt is retired and does not need vocational rehab at this time            Education: Education Goals: Education classes will be provided on a weekly basis, covering required topics. Participant will state understanding/return demonstration of topics presented.     Core Videos: Exercise    Move It!  Clinical staff conducted group or individual video education with verbal and written material and guidebook.  Patient learns the recommended Pritikin exercise program. Exercise with the goal of living a long, healthy life. Some of the health benefits of exercise include controlled diabetes, healthier blood pressure levels, improved cholesterol levels, improved heart and lung capacity, improved sleep, and better body composition. Everyone should speak with their doctor before starting or changing an exercise routine.  Biomechanical Limitations Clinical staff conducted group or individual video education with verbal and written material and guidebook.  Patient learns how biomechanical limitations can impact exercise and how we can mitigate and possibly overcome limitations to have an impactful and balanced exercise routine.  Body Composition Clinical staff conducted group or individual video education with verbal and written material and guidebook.  Patient learns that body composition (ratio of muscle mass to fat mass) is a key component to assessing overall fitness, rather than body weight alone. Increased fat mass, especially visceral belly fat, can put [place] at increased risk for metabolic syndrome, type 2 diabetes, heart disease, and even death. It is recommended to combine diet and  exercise (cardiovascular and resistance training) to improve your body composition. Seek guidance from your physician and exercise physiologist before implementing an exercise routine.  Exercise Action Plan Clinical staff conducted group or individual video education with verbal and written material and guidebook.  Patient learns the recommended strategies to achieve and enjoy long-term exercise adherence, including variety, self-motivation, self-efficacy, and positive decision making. Benefits of exercise include fitness, good health, weight management, more energy, better sleep, less stress, and overall well-being.  Medical   Heart Disease Risk Reduction Clinical staff conducted group or individual video education with verbal and written  material and guidebook.  Patient learns our heart is our most vital organ as it circulates oxygen, nutrients, white blood cells, and hormones throughout the entire body, and carries waste away. Data supports a plant-based eating plan like the Pritikin Program for its effectiveness in slowing progression of and reversing heart disease. The video provides a number of recommendations to address heart disease.   Metabolic Syndrome and Belly Fat  Clinical staff conducted group or individual video education with verbal and written material and guidebook.  Patient learns what metabolic syndrome is, how it leads to heart disease, and how one can reverse it and keep it from coming back. You have metabolic syndrome if you have 3 of the following 5 criteria: abdominal obesity, high blood pressure, high triglycerides, low HDL cholesterol, and high blood sugar.  Hypertension and Heart Disease Clinical staff conducted group or individual video education with verbal and written material and guidebook.  Patient learns that high blood pressure, or hypertension, is very common in the [place]. Hypertension is largely due to excessive salt intake, but other important risk  factors include being overweight, physical inactivity, drinking too much alcohol, smoking, and not eating enough potassium from fruits and vegetables. High blood pressure is a leading risk factor for heart attack, stroke, congestive heart failure, dementia, kidney failure, and premature death. Long-term effects of excessive salt intake include stiffening of the arteries and thickening of heart muscle and organ damage. Recommendations include ways to reduce hypertension and the risk of heart disease.  Diseases of Our Time - Focusing on Diabetes Clinical staff conducted group or individual video education with verbal and written material and guidebook.  Patient learns why the best way to stop diseases of our time is prevention, through food and other lifestyle changes. Medicine (such as prescription pills and surgeries) is often only a Band-Aid on the problem, not a long-term solution. Most common diseases of our time include obesity, type 2 diabetes, hypertension, heart disease, and cancer. The Pritikin Program is recommended and has been proven to help reduce, reverse, and/or prevent the damaging effects of metabolic syndrome.  Nutrition   Overview of the Pritikin Eating Plan  Clinical staff conducted group or individual video education with verbal and written material and guidebook.  Patient learns about the [place] for disease risk reduction. The [place] emphasizes a wide variety of unrefined, minimally-processed carbohydrates, like fruits, vegetables, whole grains, and legumes. Go, Caution, and Stop food choices are explained. Plant-based and lean animal proteins are emphasized. Rationale provided for low sodium intake for blood pressure control, low added sugars for blood sugar stabilization, and low added fats and oils for coronary artery disease risk reduction and weight management.  Calorie Density  Clinical staff conducted group or individual video education with verbal  and written material and guidebook.  Patient learns about calorie density and how it impacts the Pritikin Eating Plan. Knowing the characteristics of the food you choose will help you decide whether those foods will lead to weight gain or weight loss, and whether you want to consume more or less of them. Weight loss is usually a side effect of the Pritikin Eating Plan because of its focus on low calorie-dense foods.  Label Reading  Clinical staff conducted group or individual video education with verbal and written material and guidebook.  Patient learns about the Pritikin recommended label reading guidelines and corresponding recommendations regarding calorie density, added sugars, sodium content, and whole grains.  Dining Out - Part 1  Clinical staff conducted group or individual video education with verbal and written material and guidebook.  Patient learns that restaurant meals can be sabotaging because they can be so high in calories, fat, sodium, and/or sugar. Patient learns recommended strategies on how to positively address this and avoid unhealthy pitfalls.  Facts on Fats  Clinical staff conducted group or individual video education with verbal and written material and guidebook.  Patient learns that lifestyle modifications can be just as effective, if not more so, as many medications for lowering your risk of heart disease. A Pritikin lifestyle can help to reduce your risk of inflammation and atherosclerosis (cholesterol build-up, or plaque, in the artery walls). Lifestyle interventions such as dietary choices and physical activity address the cause of atherosclerosis. A review of the types of fats and their impact on blood cholesterol levels, along with dietary recommendations to reduce fat intake is also included.  Nutrition Action Plan  Clinical staff conducted group or individual video education with verbal and written material and guidebook.  Patient learns how to incorporate Pritikin  recommendations into their lifestyle. Recommendations include planning and keeping personal health goals in mind as an important part of their success.  Healthy Mind-Set    Healthy Minds, Bodies, Hearts  Clinical staff conducted group or individual video education with verbal and written material and guidebook.  Patient learns how to identify when they are stressed. Video will discuss the impact of that stress, as well as the many benefits of stress management. Patient will also be introduced to stress management techniques. The way we think, act, and feel has an impact on our hearts.  How Our Thoughts Can Heal Our Hearts  Clinical staff conducted group or individual video education with verbal and written material and guidebook.  Patient learns that negative thoughts can cause depression and anxiety. This can result in negative lifestyle behavior and serious health problems. Cognitive behavioral therapy is an effective method to help control our thoughts in order to change and improve our emotional outlook.  Additional Videos:  Exercise    Improving Performance  Clinical staff conducted group or individual video education with verbal and written material and guidebook.  Patient learns to use a non-linear approach by alternating intensity levels and lengths of time spent exercising to help burn more calories and lose more body fat. Cardiovascular exercise helps improve heart health, metabolism, hormonal balance, blood sugar control, and recovery from fatigue. Resistance training improves strength, endurance, balance, coordination, reaction time, metabolism, and muscle mass. Flexibility exercise improves circulation, posture, and balance. Seek guidance from your physician and exercise physiologist before implementing an exercise routine and learn your capabilities and proper form for all exercise.  Introduction to Yoga  Clinical staff conducted group or individual video education with verbal and  written material and guidebook.  Patient learns about yoga, a discipline of the coming together of mind, breath, and body. The benefits of yoga include improved flexibility, improved range of motion, better posture and core strength, increased lung function, weight loss, and positive self-image. Yoga's heart health benefits include lowered blood pressure, healthier heart rate, decreased cholesterol and triglyceride levels, improved immune function, and reduced stress. Seek guidance from your physician and exercise physiologist before implementing an exercise routine and learn your capabilities and proper form for all exercise.  Medical   Aging: Enhancing Your Quality of Life  Clinical staff conducted group or individual video education with verbal and written material and guidebook.  Patient learns key strategies and recommendations to stay  in good physical health and enhance quality of life, such as prevention strategies, having an advocate, securing a Health Care Proxy and Power of Attorney, and keeping a list of medications and system for tracking them. It also discusses how to avoid risk for bone loss.  Biology of Weight Control  Clinical staff conducted group or individual video education with verbal and written material and guidebook.  Patient learns that weight gain occurs because we consume more calories than we burn (eating more, moving less). Even if your body weight is normal, you may have higher ratios of fat compared to muscle mass. Too much body fat puts you at increased risk for cardiovascular disease, heart attack, stroke, type 2 diabetes, and obesity-related cancers. In addition to exercise, following the [place] can help reduce your risk.  Decoding Lab Results  Clinical staff conducted group or individual video education with verbal and written material and guidebook.  Patient learns that lab test reflects one measurement whose values change over time and are influenced  by many factors, including medication, stress, sleep, exercise, food, hydration, pre-existing medical conditions, and more. It is recommended to use the knowledge from this video to become more involved with your lab results and evaluate your numbers to speak with your doctor.   Diseases of Our Time - Overview  Clinical staff conducted group or individual video education with verbal and written material and guidebook.  Patient learns that according to the CDC, 50% to 70% of chronic diseases (such as obesity, type 2 diabetes, elevated lipids, hypertension, and heart disease) are avoidable through lifestyle improvements including healthier food choices, listening to satiety cues, and increased physical activity.  Sleep Disorders Clinical staff conducted group or individual video education with verbal and written material and guidebook.  Patient learns how good quality and duration of sleep are important to overall health and well-being. Patient also learns about sleep disorders and how they impact health along with recommendations to address them, including discussing with a physician.  Nutrition  Dining Out - Part 2 Clinical staff conducted group or individual video education with verbal and written material and guidebook.  Patient learns how to plan ahead and communicate in order to maximize their dining experience in a healthy and nutritious manner. Included are recommended food choices based on the type of restaurant the patient is visiting.   Fueling a Best boy conducted group or individual video education with verbal and written material and guidebook.  There is a strong connection between our food choices and our health. Diseases like obesity and type 2 diabetes are very prevalent and are in large-part due to lifestyle choices. The Pritikin Eating Plan provides plenty of food and hunger-curbing satisfaction. It is easy to follow, affordable, and helps reduce health  risks.  Menu Workshop  Clinical staff conducted group or individual video education with verbal and written material and guidebook.  Patient learns that restaurant meals can sabotage health goals because they are often packed with calories, fat, sodium, and sugar. Recommendations include strategies to plan ahead and to communicate with the manager, chef, or server to help order a healthier meal.  Planning Your Eating Strategy  Clinical staff conducted group or individual video education with verbal and written material and guidebook.  Patient learns about the [place] and its benefit of reducing the risk of disease. The [place] does not focus on calories. Instead, it emphasizes high-quality, nutrient-rich foods. By knowing the characteristics of the foods,  we choose, we can determine their calorie density and make informed decisions.  Targeting Your Nutrition Priorities  Clinical staff conducted group or individual video education with verbal and written material and guidebook.  Patient learns that lifestyle habits have a tremendous impact on disease risk and progression. This video provides eating and physical activity recommendations based on your personal health goals, such as reducing LDL cholesterol, losing weight, preventing or controlling type 2 diabetes, and reducing high blood pressure.  Vitamins and Minerals  Clinical staff conducted group or individual video education with verbal and written material and guidebook.  Patient learns different ways to obtain key vitamins and minerals, including through a recommended healthy diet. It is important to discuss all supplements you take with your doctor.   Healthy Mind-Set    Smoking Cessation  Clinical staff conducted group or individual video education with verbal and written material and guidebook.  Patient learns that cigarette smoking and tobacco addiction pose a serious health risk which affects millions of  people. Stopping smoking will significantly reduce the risk of heart disease, lung disease, and many forms of cancer. Recommended strategies for quitting are covered, including working with your doctor to develop a successful plan.  Culinary   Becoming a Financial trader conducted group or individual video education with verbal and written material and guidebook.  Patient learns that cooking at home can be healthy, cost-effective, quick, and puts them in control. Keys to cooking healthy recipes will include looking at your recipe, assessing your equipment needs, planning ahead, making it simple, choosing cost-effective seasonal ingredients, and limiting the use of added fats, salts, and sugars.  Cooking - Breakfast and Snacks  Clinical staff conducted group or individual video education with verbal and written material and guidebook.  Patient learns how important breakfast is to satiety and nutrition through the entire day. Recommendations include key foods to eat during breakfast to help stabilize blood sugar levels and to prevent overeating at meals later in the day. Planning ahead is also a key component.  Cooking - Human resources officer conducted group or individual video education with verbal and written material and guidebook.  Patient learns eating strategies to improve overall health, including an approach to cook more at home. Recommendations include thinking of animal protein as a side on your plate rather than center stage and focusing instead on lower calorie dense options like vegetables, fruits, whole grains, and plant-based proteins, such as beans. Making sauces in large quantities to freeze for later and leaving the skin on your vegetables are also recommended to maximize your experience.  Cooking - Healthy Salads and Dressing Clinical staff conducted group or individual video education with verbal and written material and guidebook.  Patient learns that  vegetables, fruits, whole grains, and legumes are the foundations of the [place]. Recommendations include how to incorporate each of these in flavorful and healthy salads, and how to create homemade salad dressings. Proper handling of ingredients is also covered. Cooking - Soups and [employer] - Soups and Desserts Clinical staff conducted group or individual video education with verbal and written material and guidebook.  Patient learns that Pritikin soups and desserts make for easy, nutritious, and delicious snacks and meal components that are low in sodium, fat, sugar, and calorie density, while high in vitamins, minerals, and filling fiber. Recommendations include simple and healthy ideas for soups and desserts.   Overview     The Pritikin Solution Program Overview Clinical  staff conducted group or individual video education with verbal and written material and guidebook.  Patient learns that the results of the [place] Program have been documented in more than 100 articles published in peer-reviewed journals, and the benefits include reducing risk factors for (and, in some cases, even reversing) high cholesterol, high blood pressure, type 2 diabetes, obesity, and more! An overview of the three key pillars of the Pritikin Program will be covered: eating well, doing regular exercise, and having a healthy mind-set.  WORKSHOPS  Exercise: Exercise Basics: Building Your Action Plan Clinical staff led group instruction and group discussion with PowerPoint presentation and patient guidebook. To enhance the learning environment the use of posters, models and videos may be added. At the conclusion of this workshop, patients will comprehend the difference between physical activity and exercise, as well as the benefits of incorporating both, into their routine. Patients will understand the FITT (Frequency, Intensity, Time, and Type) principle and how to use it to build an exercise action  plan. In addition, safety concerns and other considerations for exercise and cardiac rehab will be addressed by the presenter. The purpose of this lesson is to promote a comprehensive and effective weekly exercise routine in order to improve patients' overall level of fitness.   Managing Heart Disease: Your Path to a Healthier Heart Clinical staff led group instruction and group discussion with PowerPoint presentation and patient guidebook. To enhance the learning environment the use of posters, models and videos may be added.At the conclusion of this workshop, patients will understand the anatomy and physiology of the heart. Additionally, they will understand how Pritikin's three pillars impact the risk factors, the progression, and the management of heart disease.  The purpose of this lesson is to provide a high-level overview of the heart, heart disease, and how the Pritikin lifestyle positively impacts risk factors.  Exercise Biomechanics Clinical staff led group instruction and group discussion with PowerPoint presentation and patient guidebook. To enhance the learning environment the use of posters, models and videos may be added. Patients will learn how the structural parts of their bodies function and how these functions impact their daily activities, movement, and exercise. Patients will learn how to promote a neutral spine, learn how to manage pain, and identify ways to improve their physical movement in order to promote healthy living. The purpose of this lesson is to expose patients to common physical limitations that impact physical activity. Participants will learn practical ways to adapt and manage aches and pains, and to minimize their effect on regular exercise. Patients will learn how to maintain good posture while sitting, walking, and lifting.  Balance Training and Fall Prevention  Clinical staff led group instruction and group discussion with PowerPoint presentation and  patient guidebook. To enhance the learning environment the use of posters, models and videos may be added. At the conclusion of this workshop, patients will understand the importance of their sensorimotor skills (vision, proprioception, and the vestibular system) in maintaining their ability to balance as they age. Patients will apply a variety of balancing exercises that are appropriate for their current level of function. Patients will understand the common causes for poor balance, possible solutions to these problems, and ways to modify their physical environment in order to minimize their fall risk. The purpose of this lesson is to teach patients about the importance of maintaining balance as they age and ways to minimize their risk of falling.  WORKSHOPS   Nutrition:  Fueling a Scientist, research (physical sciences) led  group instruction and group discussion with PowerPoint presentation and patient guidebook. To enhance the learning environment the use of posters, models and videos may be added. Patients will review the foundational principles of the [place] and understand what constitutes a serving size in each of the food groups. Patients will also learn Pritikin-friendly foods that are better choices when away from home and review make-ahead meal and snack options. Calorie density will be reviewed and applied to three nutrition priorities: weight maintenance, weight loss, and weight gain. The purpose of this lesson is to reinforce (in a group setting) the key concepts around what patients are recommended to eat and how to apply these guidelines when away from home by planning and selecting Pritikin-friendly options. Patients will understand how calorie density may be adjusted for different weight management goals.  Mindful Eating  Clinical staff led group instruction and group discussion with PowerPoint presentation and patient guidebook. To enhance the learning environment the use of posters,  models and videos may be added. Patients will briefly review the concepts of the [place] and the importance of low-calorie dense foods. The concept of mindful eating will be introduced as well as the importance of paying attention to internal hunger signals. Triggers for non-hunger eating and techniques for dealing with triggers will be explored. The purpose of this lesson is to provide patients with the opportunity to review the basic principles of the [place], discuss the value of eating mindfully and how to measure internal cues of hunger and fullness using the Hunger Scale. Patients will also discuss reasons for non-hunger eating and learn strategies to use for controlling emotional eating.  Targeting Your Nutrition Priorities Clinical staff led group instruction and group discussion with PowerPoint presentation and patient guidebook. To enhance the learning environment the use of posters, models and videos may be added. Patients will learn how to determine their genetic susceptibility to disease by reviewing their family history. Patients will gain insight into the importance of diet as part of an overall healthy lifestyle in mitigating the impact of genetics and other environmental insults. The purpose of this lesson is to provide patients with the opportunity to assess their personal nutrition priorities by looking at their family history, their own health history and current risk factors. Patients will also be able to discuss ways of prioritizing and modifying the [place] for their highest risk areas  Menu  Clinical staff led group instruction and group discussion with PowerPoint presentation and patient guidebook. To enhance the learning environment the use of posters, models and videos may be added. Using menus brought in from [employer], or printed from [employer], patients will apply the [place] dining out guidelines that were presented in the  [employer] video. Patients will also be able to practice these guidelines in a variety of provided scenarios. The purpose of this lesson is to provide patients with the opportunity to practice hands-on learning of the [place] with actual menus and practice scenarios.  Label Reading Clinical staff led group instruction and group discussion with PowerPoint presentation and patient guidebook. To enhance the learning environment the use of posters, models and videos may be added. Patients will review and discuss the Pritikin label reading guidelines presented in Pritikin's Label Reading Educational series video. Using fool labels brought in from local grocery stores and markets, patients will apply the label reading guidelines and determine if the packaged food meet the Pritikin guidelines. The purpose  of this lesson is to provide patients with the opportunity to review, discuss, and practice hands-on learning of the Pritikin Label Reading guidelines with actual packaged food labels. [employer] Workshops are designed to teach patients ways to prepare quick, simple, and affordable recipes at home. The importance of nutrition's role in chronic disease risk reduction is reflected in its emphasis in the overall Pritikin program. By learning how to prepare essential core Pritikin Eating Plan recipes, patients will increase control over what they eat; be able to customize the flavor of foods without the use of added salt, sugar, or fat; and improve the quality of the food they consume. By learning a set of core recipes which are easily assembled, quickly prepared, and affordable, patients are more likely to prepare more healthy foods at home. These workshops focus on convenient breakfasts, simple entres, side dishes, and desserts which can be prepared with minimal effort and are consistent with nutrition recommendations for cardiovascular risk  reduction. Cooking [employer] are taught by a Engineer, materials (RD) who has been trained by the [employer]. The chef or RD has a clear understanding of the importance of minimizing - if not completely eliminating - added fat, sugar, and sodium in recipes. Throughout the series of [place] Workshop sessions, patients will learn about healthy ingredients and efficient methods of cooking to build confidence in their capability to prepare    Cooking School weekly topics:  Adding Flavor- Sodium-Free  Fast and Healthy Breakfasts  Powerhouse Plant-Based Proteins  Satisfying Salads and Dressings  Simple Sides and Sauces  International Cuisine-Spotlight on the [place] Zones  Delicious Desserts  Savory Soups  Efficiency Cooking - Meals in a Snap  Tasty Appetizers and Snacks  Comforting Weekend Breakfasts  One-Pot Wonders   Fast Evening Meals  Easy [place] (Psychosocial): New Thoughts, New Behaviors Clinical staff led group instruction and group discussion with PowerPoint presentation and patient guidebook. To enhance the learning environment the use of posters, models and videos may be added. Patients will learn and practice techniques for developing effective health and lifestyle goals. Patients will be able to effectively apply the goal setting process learned to develop at least one new personal goal.  The purpose of this lesson is to expose patients to a new skill set of behavior modification techniques such as techniques setting SMART goals, overcoming barriers, and achieving new thoughts and new behaviors.  Managing Moods and Relationships Clinical staff led group instruction and group discussion with PowerPoint presentation and patient guidebook. To enhance the learning environment the use of posters, models and videos may be added. Patients will learn how emotional and chronic stress  factors can impact their health and relationships. They will learn healthy ways to manage their moods and utilize positive coping mechanisms. In addition, ICR patients will learn ways to improve communication skills. The purpose of this lesson is to expose patients to ways of understanding how one's mood and health are intimately connected. Developing a healthy outlook can help build positive relationships and connections with others. Patients will understand the importance of utilizing effective communication skills that include actively listening and being heard. They will learn and understand the importance of the "4 Cs" and especially Connections in fostering of a Healthy Mind-Set.  Healthy Sleep for a Healthy Heart Clinical staff led group instruction and group discussion with PowerPoint presentation and patient guidebook. To enhance the learning  environment the use of posters, models and videos may be added. At the conclusion of this workshop, patients will be able to demonstrate knowledge of the importance of sleep to overall health, well-being, and quality of life. They will understand the symptoms of, and treatments for, common sleep disorders. Patients will also be able to identify daytime and nighttime behaviors which impact sleep, and they will be able to apply these tools to help manage sleep-related challenges. The purpose of this lesson is to provide patients with a general overview of sleep and outline the importance of quality sleep. Patients will learn about a few of the most common sleep disorders. Patients will also be introduced to the concept of "sleep hygiene," and discover ways to self-manage certain sleeping problems through simple daily behavior changes. Finally, the workshop will motivate patients by clarifying the links between quality sleep and their goals of heart-healthy living.   Recognizing and Reducing Stress Clinical staff led group instruction and group discussion with  PowerPoint presentation and patient guidebook. To enhance the learning environment the use of posters, models and videos may be added. At the conclusion of this workshop, patients will be able to understand the types of stress reactions, differentiate between acute and chronic stress, and recognize the impact that chronic stress has on their health. They will also be able to apply different coping mechanisms, such as reframing negative self-talk. Patients will have the opportunity to practice a variety of stress management techniques, such as deep abdominal breathing, progressive muscle relaxation, and/or guided imagery.  The purpose of this lesson is to educate patients on the role of stress in their lives and to provide healthy techniques for coping with it.  Learning Barriers/Preferences:  Learning Barriers/Preferences - 03/06/22 1348       Learning Barriers/Preferences   Learning Barriers Sight;Hearing   wears glasses and hearing aids   Learning Preferences Skilled Demonstration;Individual Instruction             Education Topics:  Knowledge Questionnaire Score:  Knowledge Questionnaire Score - 03/06/22 1211       Knowledge Questionnaire Score   Pre Score 17/24             Core Components/Risk Factors/Patient Goals at Admission:  Personal Goals and Risk Factors at Admission - 03/06/22 1211       Core Components/Risk Factors/Patient Goals on Admission   Hypertension Yes    Intervention Provide education on lifestyle modifcations including regular physical activity/exercise, weight management, moderate sodium restriction and increased consumption of fresh fruit, vegetables, and low fat dairy, alcohol moderation, and smoking cessation.;Monitor prescription use compliance.    Expected Outcomes Short Term: Continued assessment and intervention until BP is < 140/70m HG in hypertensive participants. < 130/86mHG in hypertensive participants with diabetes, heart failure or chronic  kidney disease.;Long Term: Maintenance of blood pressure at goal levels.    Lipids Yes    Intervention Provide education and support for participant on nutrition & aerobic/resistive exercise along with prescribed medications to achieve LDL '70mg'$ , HDL >'40mg'$ .    Expected Outcomes Short Term: Participant states understanding of desired cholesterol values and is compliant with medications prescribed. Participant is following exercise prescription and nutrition guidelines.;Long Term: Cholesterol controlled with medications as prescribed, with individualized exercise RX and with personalized nutrition plan. Value goals: LDL < '70mg'$ , HDL > 40 mg.             Core Components/Risk Factors/Patient Goals Review:    Core Components/Risk Factors/Patient Goals at Discharge (Final  Review):    ITP Comments:  ITP Comments     Row Name 03/06/22 1407           ITP Comments Dr. Loreta Ave MD, Medical Director. Introduction to Pritikin Education Program/Intensive Cardiac Rehab. Initial orientation packet reviewed with the patient.                Comments: Participant attended orientation for the cardiac rehabilitation program on  03/06/2022  to perform initial intake and exercise walk test. Patient introduced to the [place] education and orientation packet was reviewed. Completed 6-minute walk test, measurements, initial ITP, and exercise prescription. Vital signs stable. Telemetry-normal sinus rhythm, BBB. Nustep test was performed due to patients deconditioning and chronic low back/hip pain. Pt c/o mild headache at the end of test which resolved before exiting CR. DM  Service time was from 10:14 to 12:37.      Lesly Rubenstein MS, ACSM-CEP, CCRP

## 2022-03-07 DIAGNOSIS — H0102A Squamous blepharitis right eye, upper and lower eyelids: Secondary | ICD-10-CM | POA: Diagnosis not present

## 2022-03-07 DIAGNOSIS — H353131 Nonexudative age-related macular degeneration, bilateral, early dry stage: Secondary | ICD-10-CM | POA: Diagnosis not present

## 2022-03-07 DIAGNOSIS — H34832 Tributary (branch) retinal vein occlusion, left eye, with macular edema: Secondary | ICD-10-CM | POA: Diagnosis not present

## 2022-03-07 DIAGNOSIS — H11823 Conjunctivochalasis, bilateral: Secondary | ICD-10-CM | POA: Diagnosis not present

## 2022-03-07 DIAGNOSIS — H40023 Open angle with borderline findings, high risk, bilateral: Secondary | ICD-10-CM | POA: Diagnosis not present

## 2022-03-07 DIAGNOSIS — H04123 Dry eye syndrome of bilateral lacrimal glands: Secondary | ICD-10-CM | POA: Diagnosis not present

## 2022-03-07 DIAGNOSIS — H0102B Squamous blepharitis left eye, upper and lower eyelids: Secondary | ICD-10-CM | POA: Diagnosis not present

## 2022-03-11 NOTE — Progress Notes (Signed)
Cardiac Individual Treatment Plan  Patient Details  Name: [name] MRN: [id number] Date of Birth: 03-05-1946 Referring Provider:   Flowsheet Row INTENSIVE CARDIAC REHAB ORIENT from 03/06/2022 in [facility] [place]  Referring Provider [name], MD       Initial Encounter Date:  [place] from 03/06/2022 in [facility]  Date 03/06/22       Visit Diagnosis: 12/24/2021 DES x 2 Cx/LM  Patient's Home Medications on Admission:  Current Outpatient Medications:    acetaminophen (TYLENOL) 500 MG tablet, Take 1,000 mg by mouth every morning. Additional 1000 mg if needed during the day, Disp: , Rfl:    aspirin EC 81 MG tablet, Take 81 mg by mouth every evening. Swallow whole., Disp: , Rfl:    Calcium Carbonate Antacid (TUMS ULTRA PO), Take 2 tablets by mouth daily as needed (acid reflux/indigestion.)., Disp: , Rfl:    clopidogrel (PLAVIX) 75 MG tablet, Take 1 tablet (75 mg total) by mouth daily with breakfast., Disp: 90 tablet, Rfl: 1   Coenzyme Q10 (COQ10) 100 MG CAPS, Take 100 mg by mouth 2 (two) times daily., Disp: , Rfl:    Cyanocobalamin (VITAMIN B-12) 5000 MCG TBDP, Take 5,000 mcg by mouth every evening., Disp: , Rfl:    ezetimibe (ZETIA) 10 MG tablet, TAKE 1/2 TABLET BY MOUTH EVERY EVENING, Disp: 45 tablet, Rfl: 11   hydrALAZINE (APRESOLINE) 25 MG tablet, Take 1 tablet (25 mg total) by mouth 3 (three) times daily as needed (For SBP >140 mm Hg)., Disp: 270 tablet, Rfl: 3   losartan (COZAAR) 25 MG tablet, TAKE 1/2 TABLET BY MOUTH EVERY EVENING, Disp: 90 tablet, Rfl: 3   metoprolol succinate (TOPROL-XL) 25 MG 24 hr tablet, Take 0.5 tablets (12.5 mg total) by mouth every morning., Disp: 90 tablet, Rfl: 3   Multiple Vitamin (MULTIVITAMIN) tablet, Take 1 tablet by mouth in the morning. Seniors, Disp: , Rfl:    Multiple Vitamins-Minerals  (PRESERVISION AREDS 2) CAPS, Take 1 capsule by mouth 2 (two) times daily., Disp: , Rfl:    nitroGLYCERIN (NITROSTAT) 0.4 MG SL tablet, TAKE 1 TABLET EVERY 5 MINUTES AS NEEDED FOR CHEST PAIN. (Patient taking differently: Place 0.4 mg under the tongue every 5 (five) minutes as needed for chest pain.), Disp: 25 tablet, Rfl: 3   nortriptyline (PAMELOR) 25 MG capsule, TAKE 1 CAPSULE BY MOUTH AT BEDTIME. (Patient taking differently: Take 25 mg by mouth at bedtime as needed (Headache).), Disp: 90 capsule, Rfl: 1   omega-3 acid ethyl esters (LOVAZA) 1 g capsule, Take 1 g by mouth 2 (two) times daily., Disp: , Rfl:    pantoprazole (PROTONIX) 40 MG tablet, Take 1 tablet (40 mg total) by mouth daily., Disp: 30 tablet, Rfl: 1   Polyethyl Glycol-Propyl Glycol (SYSTANE) 0.4-0.3 % GEL ophthalmic gel, Place 1 application into both eyes at bedtime., Disp: , Rfl:    Propylene Glycol (SYSTANE BALANCE) 0.6 % SOLN, Place 1 drop into both eyes 3 (three) times daily as needed (dry/irritated eyes.)., Disp: , Rfl:    pyridoxine (B-6) 100 MG tablet, Take 100 mg by mouth every evening., Disp: , Rfl:    rosuvastatin (CRESTOR) 20 MG tablet, TAKE ONE TABLET BY MOUTH EVERYDAY AT BEDTIME, Disp: 90 tablet, Rfl: 3   vitamin C (ASCORBIC ACID) 500 MG tablet, Take 500 mg by mouth daily., Disp: , Rfl:   Past Medical History:  Past Medical History:  Diagnosis Date   Anginal pain ([place])    Arthritis    Basal cell carcinoma    back of neck   Blood transfusion without reported diagnosis 2006   during open heart surgery   CAD (coronary artery disease)    Diverticulosis 2013   Noted on Colonoscopy   Dyspnea    with activity and walking, sometime has SOB walking to mailbox and back to the house   GERD (gastroesophageal reflux disease)    History of colon polyps 2008   Noted on Colonoscopy   History of esophageal stricture 2003   Noted on EGD   Hyperlipidemia    Hypertension    IVCD (intraventricular conduction defect) 05/2018    Noted on EKG   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   Noted on EKG   Left axis deviation 05/2018   Noted on EKG   Lipoma 04/20/2018   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   Loop Biotronik Biomonitor III for syncope and collapse 04/09/2021 04/09/2021   LVH (left ventricular hypertrophy) 05/2018   Noted on EKG   Phimosis    Pneumonia    Syncope and collapse    Tuberculosis 1956   Wears glasses    Wheezing     Tobacco Use: Social History   Tobacco Use  Smoking Status Never  Smokeless Tobacco Never    Labs: Review Flowsheet       Latest Ref Rng & Units 10/27/2013 03/16/2016 09/11/2021  Labs for ITP Cardiac and Pulmonary Rehab  Cholestrol 0 - 200 mg/dL - 84  -  LDL (calc) 0 - 99 mg/dL - 39  -  HDL-C >40 mg/dL - 32  -  Trlycerides <150 mg/dL - 67  -  TCO2 22 - 32 mmol/L 26  - 24     Capillary Blood Glucose: No results found for: "GLUCAP"   Exercise Target Goals: Exercise Program Goal: Individual exercise prescription set using results from initial 6 min walk test and THRR while considering  patient's activity barriers and safety.   Exercise Prescription Goal: Initial exercise prescription builds to 30-45 minutes a day of aerobic activity, 2-3 days per week.  Home exercise guidelines will be given to patient during program as part of exercise prescription that the participant will acknowledge.  Activity Barriers & Risk Stratification:  Activity Barriers & Cardiac Risk Stratification - 03/06/22 1226       Activity Barriers & Cardiac Risk Stratification   Activity Barriers Arthritis;Back Problems;Neck/Spine Problems;Joint Problems;Deconditioning;Muscular Weakness;Shortness of Breath;Chest Pain/Angina;Balance Concerns;History of Falls;Assistive Device    Cardiac Risk Stratification High             6 Minute Walk:  6 Minute Walk     Row Name 03/06/22 1128         6 Minute Walk   Phase Initial     Distance 1542 feet  Nustep used /chronic low back  and hip pain     Walk Time 6 minutes  .68 km (1542 ft) - total distance from nustep     # of Rest Breaks 0     MPH 2.92     METS 21     RPE 11     Perceived Dyspnea  1     VO2 Peak 8.95     Symptoms Yes (comment)     Comments Headache, chronic low back pain/bilateral hip pain 3-4/10     Resting HR 71 bpm     Resting  BP 110/60     Resting Oxygen Saturation  99 %     Exercise Oxygen Saturation  during 6 min walk 96 %     Max Ex. HR 74 bpm     Max Ex. BP 136/60     2 Minute Post BP 112/66              Oxygen Initial Assessment:   Oxygen Re-Evaluation:   Oxygen Discharge (Final Oxygen Re-Evaluation):   Initial Exercise Prescription:  Initial Exercise Prescription - 03/06/22 1300       Date of Initial Exercise RX and Referring Provider   Date 03/06/22    Referring Provider [name], MD    Expected Discharge Date 05/02/22      NuStep   Level 1    SPM 80    Minutes 15    METs 2.6      Prescription Details   Frequency (times per week) 3    Duration Progress to 30 minutes of continuous aerobic without signs/symptoms of physical distress      Intensity   THRR 40-80% of Max Heartrate 57-114    Ratings of Perceived Exertion 11-13    Perceived Dyspnea 0-4      Progression   Progression Continue progressive overload as per policy without signs/symptoms or physical distress.      Resistance Training   Training Prescription Yes    Weight 2 lbs    Reps 10-15             Perform Capillary Blood Glucose checks as needed.  Exercise Prescription Changes:   Exercise Comments:   Exercise Goals and Review:   Exercise Goals     Row Name 03/06/22 1226             Exercise Goals   Increase Physical Activity Yes       Intervention Provide advice, education, support and counseling about physical activity/exercise needs.;Develop an individualized exercise prescription for aerobic and resistive training based on initial evaluation findings, risk  stratification, comorbidities and participant's personal goals.       Expected Outcomes Short Term: Attend rehab on a regular basis to increase amount of physical activity.;Long Term: Add in home exercise to make exercise part of routine and to increase amount of physical activity.;Long Term: Exercising regularly at least 3-5 days a week.       Increase Strength and Stamina Yes       Intervention Provide advice, education, support and counseling about physical activity/exercise needs.;Develop an individualized exercise prescription for aerobic and resistive training based on initial evaluation findings, risk stratification, comorbidities and participant's personal goals.       Expected Outcomes Short Term: Increase workloads from initial exercise prescription for resistance, speed, and METs.;Long Term: Improve cardiorespiratory fitness, muscular endurance and strength as measured by increased METs and functional capacity (6MWT);Short Term: Perform resistance training exercises routinely during rehab and add in resistance training at home       Able to understand and use rate of perceived exertion (RPE) scale Yes       Intervention Provide education and explanation on how to use RPE scale       Expected Outcomes Short Term: Able to use RPE daily in rehab to express subjective intensity level;Long Term:  Able to use RPE to guide intensity level when exercising independently       Knowledge and understanding of Target Heart Rate Range (THRR) Yes       Intervention Provide education  and explanation of THRR including how the numbers were predicted and where they are located for reference       Expected Outcomes Short Term: Able to state/look up THRR;Short Term: Able to use daily as guideline for intensity in rehab;Long Term: Able to use THRR to govern intensity when exercising independently       Understanding of Exercise Prescription Yes       Intervention Provide education, explanation, and written  materials on patient's individual exercise prescription       Expected Outcomes Short Term: Able to explain program exercise prescription;Long Term: Able to explain home exercise prescription to exercise independently                Exercise Goals Re-Evaluation :   Discharge Exercise Prescription (Final Exercise Prescription Changes):   Nutrition:  Target Goals: Understanding of nutrition guidelines, daily intake of sodium '1500mg'$ , cholesterol '200mg'$ , calories 30% from fat and 7% or less from saturated fats, daily to have 5 or more servings of fruits and vegetables.  Biometrics:  Pre Biometrics - 03/06/22 1045       Pre Biometrics   Waist Circumference 43 inches    Hip Circumference 41.5 inches    Waist to Hip Ratio 1.04 %    Triceps Skinfold 13 mm    % Body Fat 29.8 %    Grip Strength 29 kg    Flexibility --   Not done due to back problems/chronic back pain   Single Leg Stand --   not performed. Pt with cane, chronic back/hip pain             Nutrition Therapy Plan and Nutrition Goals:   Nutrition Assessments:  MEDIFICTS Score Key: ?70 Need to make dietary changes  40-70 Heart Healthy Diet ? 40 Therapeutic Level Cholesterol Diet    Picture Your Plate Scores: <16 Unhealthy dietary pattern with much room for improvement. 41-50 Dietary pattern unlikely to meet recommendations for good health and room for improvement. 51-60 More healthful dietary pattern, with some room for improvement.  >60 Healthy dietary pattern, although there may be some specific behaviors that could be improved.    Nutrition Goals Re-Evaluation:   Nutrition Goals Re-Evaluation:   Nutrition Goals Discharge (Final Nutrition Goals Re-Evaluation):   Psychosocial: Target Goals: Acknowledge presence or absence of significant depression and/or stress, maximize coping skills, provide positive support system. Participant is able to verbalize types and ability to use techniques and skills  needed for reducing stress and depression.  Initial Review & Psychosocial Screening:  Initial Psych Review & Screening - 03/06/22 1409       Initial Review   Current issues with None Identified      Family Dynamics   Good Support System? Yes   Pt has wife for support     Barriers   Psychosocial barriers to participate in program There are no identifiable barriers or psychosocial needs.      Screening Interventions   Interventions Encouraged to exercise             Quality of Life Scores:  Quality of Life - 03/06/22 1347       Quality of Life   Select Quality of Life      Quality of Life Scores   Health/Function Pre 21.3 %    Socioeconomic Pre 28.33 %    Psych/Spiritual Pre 28.21 %    Family Pre 26.4 %    GLOBAL Pre 24.82 %  Scores of 19 and below usually indicate a poorer quality of life in these areas.  A difference of  2-3 points is a clinically meaningful difference.  A difference of 2-3 points in the total score of the Quality of Life Index has been associated with significant improvement in overall quality of life, self-image, physical symptoms, and general health in studies assessing change in quality of life.  PHQ-9: Review Flowsheet       03/06/2022  Depression screen PHQ 2/9  Decreased Interest 2  Down, Depressed, Hopeless 0  PHQ - 2 Score 2  Altered sleeping 0  Tired, decreased energy 1  Change in appetite 1  Feeling bad or failure about yourself  0  Trouble concentrating 0  Moving slowly or fidgety/restless 1  Suicidal thoughts 0  PHQ-9 Score 5  Difficult doing work/chores Not difficult at all   Interpretation of Total Score  Total Score Depression Severity:  1-4 = Minimal depression, 5-9 = Mild depression, 10-14 = Moderate depression, 15-19 = Moderately severe depression, 20-27 = Severe depression   Psychosocial Evaluation and Intervention:   Psychosocial Re-Evaluation:   Psychosocial Discharge (Final Psychosocial  Re-Evaluation):   Vocational Rehabilitation: Provide vocational rehab assistance to qualifying candidates.   Vocational Rehab Evaluation & Intervention:  Vocational Rehab - 03/06/22 1409       Initial Vocational Rehab Evaluation & Intervention   Assessment shows need for Vocational Rehabilitation No   Pt is retired and does not need vocational rehab at this time            Education: Education Goals: Education classes will be provided on a weekly basis, covering required topics. Participant will state understanding/return demonstration of topics presented.     Core Videos: Exercise    Move It!  Clinical staff conducted group or individual video education with verbal and written material and guidebook.  Patient learns the recommended Pritikin exercise program. Exercise with the goal of living a long, healthy life. Some of the health benefits of exercise include controlled diabetes, healthier blood pressure levels, improved cholesterol levels, improved heart and lung capacity, improved sleep, and better body composition. Everyone should speak with their doctor before starting or changing an exercise routine.  Biomechanical Limitations Clinical staff conducted group or individual video education with verbal and written material and guidebook.  Patient learns how biomechanical limitations can impact exercise and how we can mitigate and possibly overcome limitations to have an impactful and balanced exercise routine.  Body Composition Clinical staff conducted group or individual video education with verbal and written material and guidebook.  Patient learns that body composition (ratio of muscle mass to fat mass) is a key component to assessing overall fitness, rather than body weight alone. Increased fat mass, especially visceral belly fat, can put [place] at increased risk for metabolic syndrome, type 2 diabetes, heart disease, and even death. It is recommended to combine diet and  exercise (cardiovascular and resistance training) to improve your body composition. Seek guidance from your physician and exercise physiologist before implementing an exercise routine.  Exercise Action Plan Clinical staff conducted group or individual video education with verbal and written material and guidebook.  Patient learns the recommended strategies to achieve and enjoy long-term exercise adherence, including variety, self-motivation, self-efficacy, and positive decision making. Benefits of exercise include fitness, good health, weight management, more energy, better sleep, less stress, and overall well-being.  Medical   Heart Disease Risk Reduction Clinical staff conducted group or individual video education with verbal and written  material and guidebook.  Patient learns our heart is our most vital organ as it circulates oxygen, nutrients, white blood cells, and hormones throughout the entire body, and carries waste away. Data supports a plant-based eating plan like the Pritikin Program for its effectiveness in slowing progression of and reversing heart disease. The video provides a number of recommendations to address heart disease.   Metabolic Syndrome and Belly Fat  Clinical staff conducted group or individual video education with verbal and written material and guidebook.  Patient learns what metabolic syndrome is, how it leads to heart disease, and how one can reverse it and keep it from coming back. You have metabolic syndrome if you have 3 of the following 5 criteria: abdominal obesity, high blood pressure, high triglycerides, low HDL cholesterol, and high blood sugar.  Hypertension and Heart Disease Clinical staff conducted group or individual video education with verbal and written material and guidebook.  Patient learns that high blood pressure, or hypertension, is very common in the [place]. Hypertension is largely due to excessive salt intake, but other important risk  factors include being overweight, physical inactivity, drinking too much alcohol, smoking, and not eating enough potassium from fruits and vegetables. High blood pressure is a leading risk factor for heart attack, stroke, congestive heart failure, dementia, kidney failure, and premature death. Long-term effects of excessive salt intake include stiffening of the arteries and thickening of heart muscle and organ damage. Recommendations include ways to reduce hypertension and the risk of heart disease.  Diseases of Our Time - Focusing on Diabetes Clinical staff conducted group or individual video education with verbal and written material and guidebook.  Patient learns why the best way to stop diseases of our time is prevention, through food and other lifestyle changes. Medicine (such as prescription pills and surgeries) is often only a Band-Aid on the problem, not a long-term solution. Most common diseases of our time include obesity, type 2 diabetes, hypertension, heart disease, and cancer. The Pritikin Program is recommended and has been proven to help reduce, reverse, and/or prevent the damaging effects of metabolic syndrome.  Nutrition   Overview of the Pritikin Eating Plan  Clinical staff conducted group or individual video education with verbal and written material and guidebook.  Patient learns about the [place] for disease risk reduction. The [place] emphasizes a wide variety of unrefined, minimally-processed carbohydrates, like fruits, vegetables, whole grains, and legumes. Go, Caution, and Stop food choices are explained. Plant-based and lean animal proteins are emphasized. Rationale provided for low sodium intake for blood pressure control, low added sugars for blood sugar stabilization, and low added fats and oils for coronary artery disease risk reduction and weight management.  Calorie Density  Clinical staff conducted group or individual video education with verbal  and written material and guidebook.  Patient learns about calorie density and how it impacts the Pritikin Eating Plan. Knowing the characteristics of the food you choose will help you decide whether those foods will lead to weight gain or weight loss, and whether you want to consume more or less of them. Weight loss is usually a side effect of the Pritikin Eating Plan because of its focus on low calorie-dense foods.  Label Reading  Clinical staff conducted group or individual video education with verbal and written material and guidebook.  Patient learns about the Pritikin recommended label reading guidelines and corresponding recommendations regarding calorie density, added sugars, sodium content, and whole grains.  Dining Out - Part 1  Clinical staff conducted group or individual video education with verbal and written material and guidebook.  Patient learns that restaurant meals can be sabotaging because they can be so high in calories, fat, sodium, and/or sugar. Patient learns recommended strategies on how to positively address this and avoid unhealthy pitfalls.  Facts on Fats  Clinical staff conducted group or individual video education with verbal and written material and guidebook.  Patient learns that lifestyle modifications can be just as effective, if not more so, as many medications for lowering your risk of heart disease. A Pritikin lifestyle can help to reduce your risk of inflammation and atherosclerosis (cholesterol build-up, or plaque, in the artery walls). Lifestyle interventions such as dietary choices and physical activity address the cause of atherosclerosis. A review of the types of fats and their impact on blood cholesterol levels, along with dietary recommendations to reduce fat intake is also included.  Nutrition Action Plan  Clinical staff conducted group or individual video education with verbal and written material and guidebook.  Patient learns how to incorporate Pritikin  recommendations into their lifestyle. Recommendations include planning and keeping personal health goals in mind as an important part of their success.  Healthy Mind-Set    Healthy Minds, Bodies, Hearts  Clinical staff conducted group or individual video education with verbal and written material and guidebook.  Patient learns how to identify when they are stressed. Video will discuss the impact of that stress, as well as the many benefits of stress management. Patient will also be introduced to stress management techniques. The way we think, act, and feel has an impact on our hearts.  How Our Thoughts Can Heal Our Hearts  Clinical staff conducted group or individual video education with verbal and written material and guidebook.  Patient learns that negative thoughts can cause depression and anxiety. This can result in negative lifestyle behavior and serious health problems. Cognitive behavioral therapy is an effective method to help control our thoughts in order to change and improve our emotional outlook.  Additional Videos:  Exercise    Improving Performance  Clinical staff conducted group or individual video education with verbal and written material and guidebook.  Patient learns to use a non-linear approach by alternating intensity levels and lengths of time spent exercising to help burn more calories and lose more body fat. Cardiovascular exercise helps improve heart health, metabolism, hormonal balance, blood sugar control, and recovery from fatigue. Resistance training improves strength, endurance, balance, coordination, reaction time, metabolism, and muscle mass. Flexibility exercise improves circulation, posture, and balance. Seek guidance from your physician and exercise physiologist before implementing an exercise routine and learn your capabilities and proper form for all exercise.  Introduction to Yoga  Clinical staff conducted group or individual video education with verbal and  written material and guidebook.  Patient learns about yoga, a discipline of the coming together of mind, breath, and body. The benefits of yoga include improved flexibility, improved range of motion, better posture and core strength, increased lung function, weight loss, and positive self-image. Yoga's heart health benefits include lowered blood pressure, healthier heart rate, decreased cholesterol and triglyceride levels, improved immune function, and reduced stress. Seek guidance from your physician and exercise physiologist before implementing an exercise routine and learn your capabilities and proper form for all exercise.  Medical   Aging: Enhancing Your Quality of Life  Clinical staff conducted group or individual video education with verbal and written material and guidebook.  Patient learns key strategies and recommendations to stay  in good physical health and enhance quality of life, such as prevention strategies, having an advocate, securing a Health Care Proxy and Power of Attorney, and keeping a list of medications and system for tracking them. It also discusses how to avoid risk for bone loss.  Biology of Weight Control  Clinical staff conducted group or individual video education with verbal and written material and guidebook.  Patient learns that weight gain occurs because we consume more calories than we burn (eating more, moving less). Even if your body weight is normal, you may have higher ratios of fat compared to muscle mass. Too much body fat puts you at increased risk for cardiovascular disease, heart attack, stroke, type 2 diabetes, and obesity-related cancers. In addition to exercise, following the [place] can help reduce your risk.  Decoding Lab Results  Clinical staff conducted group or individual video education with verbal and written material and guidebook.  Patient learns that lab test reflects one measurement whose values change over time and are influenced  by many factors, including medication, stress, sleep, exercise, food, hydration, pre-existing medical conditions, and more. It is recommended to use the knowledge from this video to become more involved with your lab results and evaluate your numbers to speak with your doctor.   Diseases of Our Time - Overview  Clinical staff conducted group or individual video education with verbal and written material and guidebook.  Patient learns that according to the CDC, 50% to 70% of chronic diseases (such as obesity, type 2 diabetes, elevated lipids, hypertension, and heart disease) are avoidable through lifestyle improvements including healthier food choices, listening to satiety cues, and increased physical activity.  Sleep Disorders Clinical staff conducted group or individual video education with verbal and written material and guidebook.  Patient learns how good quality and duration of sleep are important to overall health and well-being. Patient also learns about sleep disorders and how they impact health along with recommendations to address them, including discussing with a physician.  Nutrition  Dining Out - Part 2 Clinical staff conducted group or individual video education with verbal and written material and guidebook.  Patient learns how to plan ahead and communicate in order to maximize their dining experience in a healthy and nutritious manner. Included are recommended food choices based on the type of restaurant the patient is visiting.   Fueling a Best boy conducted group or individual video education with verbal and written material and guidebook.  There is a strong connection between our food choices and our health. Diseases like obesity and type 2 diabetes are very prevalent and are in large-part due to lifestyle choices. The Pritikin Eating Plan provides plenty of food and hunger-curbing satisfaction. It is easy to follow, affordable, and helps reduce health  risks.  Menu Workshop  Clinical staff conducted group or individual video education with verbal and written material and guidebook.  Patient learns that restaurant meals can sabotage health goals because they are often packed with calories, fat, sodium, and sugar. Recommendations include strategies to plan ahead and to communicate with the manager, chef, or server to help order a healthier meal.  Planning Your Eating Strategy  Clinical staff conducted group or individual video education with verbal and written material and guidebook.  Patient learns about the [place] and its benefit of reducing the risk of disease. The [place] does not focus on calories. Instead, it emphasizes high-quality, nutrient-rich foods. By knowing the characteristics of the foods,  we choose, we can determine their calorie density and make informed decisions.  Targeting Your Nutrition Priorities  Clinical staff conducted group or individual video education with verbal and written material and guidebook.  Patient learns that lifestyle habits have a tremendous impact on disease risk and progression. This video provides eating and physical activity recommendations based on your personal health goals, such as reducing LDL cholesterol, losing weight, preventing or controlling type 2 diabetes, and reducing high blood pressure.  Vitamins and Minerals  Clinical staff conducted group or individual video education with verbal and written material and guidebook.  Patient learns different ways to obtain key vitamins and minerals, including through a recommended healthy diet. It is important to discuss all supplements you take with your doctor.   Healthy Mind-Set    Smoking Cessation  Clinical staff conducted group or individual video education with verbal and written material and guidebook.  Patient learns that cigarette smoking and tobacco addiction pose a serious health risk which affects millions of  people. Stopping smoking will significantly reduce the risk of heart disease, lung disease, and many forms of cancer. Recommended strategies for quitting are covered, including working with your doctor to develop a successful plan.  Culinary   Becoming a Financial trader conducted group or individual video education with verbal and written material and guidebook.  Patient learns that cooking at home can be healthy, cost-effective, quick, and puts them in control. Keys to cooking healthy recipes will include looking at your recipe, assessing your equipment needs, planning ahead, making it simple, choosing cost-effective seasonal ingredients, and limiting the use of added fats, salts, and sugars.  Cooking - Breakfast and Snacks  Clinical staff conducted group or individual video education with verbal and written material and guidebook.  Patient learns how important breakfast is to satiety and nutrition through the entire day. Recommendations include key foods to eat during breakfast to help stabilize blood sugar levels and to prevent overeating at meals later in the day. Planning ahead is also a key component.  Cooking - Human resources officer conducted group or individual video education with verbal and written material and guidebook.  Patient learns eating strategies to improve overall health, including an approach to cook more at home. Recommendations include thinking of animal protein as a side on your plate rather than center stage and focusing instead on lower calorie dense options like vegetables, fruits, whole grains, and plant-based proteins, such as beans. Making sauces in large quantities to freeze for later and leaving the skin on your vegetables are also recommended to maximize your experience.  Cooking - Healthy Salads and Dressing Clinical staff conducted group or individual video education with verbal and written material and guidebook.  Patient learns that  vegetables, fruits, whole grains, and legumes are the foundations of the [place]. Recommendations include how to incorporate each of these in flavorful and healthy salads, and how to create homemade salad dressings. Proper handling of ingredients is also covered. Cooking - Soups and [employer] - Soups and Desserts Clinical staff conducted group or individual video education with verbal and written material and guidebook.  Patient learns that Pritikin soups and desserts make for easy, nutritious, and delicious snacks and meal components that are low in sodium, fat, sugar, and calorie density, while high in vitamins, minerals, and filling fiber. Recommendations include simple and healthy ideas for soups and desserts.   Overview     The Pritikin Solution Program Overview Clinical  staff conducted group or individual video education with verbal and written material and guidebook.  Patient learns that the results of the [place] Program have been documented in more than 100 articles published in peer-reviewed journals, and the benefits include reducing risk factors for (and, in some cases, even reversing) high cholesterol, high blood pressure, type 2 diabetes, obesity, and more! An overview of the three key pillars of the Pritikin Program will be covered: eating well, doing regular exercise, and having a healthy mind-set.  WORKSHOPS  Exercise: Exercise Basics: Building Your Action Plan Clinical staff led group instruction and group discussion with PowerPoint presentation and patient guidebook. To enhance the learning environment the use of posters, models and videos may be added. At the conclusion of this workshop, patients will comprehend the difference between physical activity and exercise, as well as the benefits of incorporating both, into their routine. Patients will understand the FITT (Frequency, Intensity, Time, and Type) principle and how to use it to build an exercise action  plan. In addition, safety concerns and other considerations for exercise and cardiac rehab will be addressed by the presenter. The purpose of this lesson is to promote a comprehensive and effective weekly exercise routine in order to improve patients' overall level of fitness.   Managing Heart Disease: Your Path to a Healthier Heart Clinical staff led group instruction and group discussion with PowerPoint presentation and patient guidebook. To enhance the learning environment the use of posters, models and videos may be added.At the conclusion of this workshop, patients will understand the anatomy and physiology of the heart. Additionally, they will understand how Pritikin's three pillars impact the risk factors, the progression, and the management of heart disease.  The purpose of this lesson is to provide a high-level overview of the heart, heart disease, and how the Pritikin lifestyle positively impacts risk factors.  Exercise Biomechanics Clinical staff led group instruction and group discussion with PowerPoint presentation and patient guidebook. To enhance the learning environment the use of posters, models and videos may be added. Patients will learn how the structural parts of their bodies function and how these functions impact their daily activities, movement, and exercise. Patients will learn how to promote a neutral spine, learn how to manage pain, and identify ways to improve their physical movement in order to promote healthy living. The purpose of this lesson is to expose patients to common physical limitations that impact physical activity. Participants will learn practical ways to adapt and manage aches and pains, and to minimize their effect on regular exercise. Patients will learn how to maintain good posture while sitting, walking, and lifting.  Balance Training and Fall Prevention  Clinical staff led group instruction and group discussion with PowerPoint presentation and  patient guidebook. To enhance the learning environment the use of posters, models and videos may be added. At the conclusion of this workshop, patients will understand the importance of their sensorimotor skills (vision, proprioception, and the vestibular system) in maintaining their ability to balance as they age. Patients will apply a variety of balancing exercises that are appropriate for their current level of function. Patients will understand the common causes for poor balance, possible solutions to these problems, and ways to modify their physical environment in order to minimize their fall risk. The purpose of this lesson is to teach patients about the importance of maintaining balance as they age and ways to minimize their risk of falling.  WORKSHOPS   Nutrition:  Fueling a Scientist, research (physical sciences) led  group instruction and group discussion with PowerPoint presentation and patient guidebook. To enhance the learning environment the use of posters, models and videos may be added. Patients will review the foundational principles of the [place] and understand what constitutes a serving size in each of the food groups. Patients will also learn Pritikin-friendly foods that are better choices when away from home and review make-ahead meal and snack options. Calorie density will be reviewed and applied to three nutrition priorities: weight maintenance, weight loss, and weight gain. The purpose of this lesson is to reinforce (in a group setting) the key concepts around what patients are recommended to eat and how to apply these guidelines when away from home by planning and selecting Pritikin-friendly options. Patients will understand how calorie density may be adjusted for different weight management goals.  Mindful Eating  Clinical staff led group instruction and group discussion with PowerPoint presentation and patient guidebook. To enhance the learning environment the use of posters,  models and videos may be added. Patients will briefly review the concepts of the [place] and the importance of low-calorie dense foods. The concept of mindful eating will be introduced as well as the importance of paying attention to internal hunger signals. Triggers for non-hunger eating and techniques for dealing with triggers will be explored. The purpose of this lesson is to provide patients with the opportunity to review the basic principles of the [place], discuss the value of eating mindfully and how to measure internal cues of hunger and fullness using the Hunger Scale. Patients will also discuss reasons for non-hunger eating and learn strategies to use for controlling emotional eating.  Targeting Your Nutrition Priorities Clinical staff led group instruction and group discussion with PowerPoint presentation and patient guidebook. To enhance the learning environment the use of posters, models and videos may be added. Patients will learn how to determine their genetic susceptibility to disease by reviewing their family history. Patients will gain insight into the importance of diet as part of an overall healthy lifestyle in mitigating the impact of genetics and other environmental insults. The purpose of this lesson is to provide patients with the opportunity to assess their personal nutrition priorities by looking at their family history, their own health history and current risk factors. Patients will also be able to discuss ways of prioritizing and modifying the [place] for their highest risk areas  Menu  Clinical staff led group instruction and group discussion with PowerPoint presentation and patient guidebook. To enhance the learning environment the use of posters, models and videos may be added. Using menus brought in from [employer], or printed from [employer], patients will apply the [place] dining out guidelines that were presented in the  [employer] video. Patients will also be able to practice these guidelines in a variety of provided scenarios. The purpose of this lesson is to provide patients with the opportunity to practice hands-on learning of the [place] with actual menus and practice scenarios.  Label Reading Clinical staff led group instruction and group discussion with PowerPoint presentation and patient guidebook. To enhance the learning environment the use of posters, models and videos may be added. Patients will review and discuss the Pritikin label reading guidelines presented in Pritikin's Label Reading Educational series video. Using fool labels brought in from local grocery stores and markets, patients will apply the label reading guidelines and determine if the packaged food meet the Pritikin guidelines. The purpose  of this lesson is to provide patients with the opportunity to review, discuss, and practice hands-on learning of the Pritikin Label Reading guidelines with actual packaged food labels. [place] Workshops are designed to teach patients ways to prepare quick, simple, and affordable recipes at home. The importance of nutrition's role in chronic disease risk reduction is reflected in its emphasis in the overall Pritikin program. By learning how to prepare essential core Pritikin Eating Plan recipes, patients will increase control over what they eat; be able to customize the flavor of foods without the use of added salt, sugar, or fat; and improve the quality of the food they consume. By learning a set of core recipes which are easily assembled, quickly prepared, and affordable, patients are more likely to prepare more healthy foods at home. These workshops focus on convenient breakfasts, simple entres, side dishes, and desserts which can be prepared with minimal effort and are consistent with nutrition recommendations for cardiovascular risk  reduction. Cooking [employer] are taught by a Engineer, materials (RD) who has been trained by the [employer]. The chef or RD has a clear understanding of the importance of minimizing - if not completely eliminating - added fat, sugar, and sodium in recipes. Throughout the series of [place] Workshop sessions, patients will learn about healthy ingredients and efficient methods of cooking to build confidence in their capability to prepare    Cooking School weekly topics:  Adding Flavor- Sodium-Free  Fast and Healthy Breakfasts  Powerhouse Plant-Based Proteins  Satisfying Salads and Dressings  Simple Sides and Sauces  International Cuisine-Spotlight on the [place] Zones  Delicious Desserts  Savory Soups  Efficiency Cooking - Meals in a Snap  Tasty Appetizers and Snacks  Comforting Weekend Breakfasts  One-Pot Wonders   Fast Evening Meals  Easy [place] (Psychosocial): New Thoughts, New Behaviors Clinical staff led group instruction and group discussion with PowerPoint presentation and patient guidebook. To enhance the learning environment the use of posters, models and videos may be added. Patients will learn and practice techniques for developing effective health and lifestyle goals. Patients will be able to effectively apply the goal setting process learned to develop at least one new personal goal.  The purpose of this lesson is to expose patients to a new skill set of behavior modification techniques such as techniques setting SMART goals, overcoming barriers, and achieving new thoughts and new behaviors.  Managing Moods and Relationships Clinical staff led group instruction and group discussion with PowerPoint presentation and patient guidebook. To enhance the learning environment the use of posters, models and videos may be added. Patients will learn how emotional and chronic stress  factors can impact their health and relationships. They will learn healthy ways to manage their moods and utilize positive coping mechanisms. In addition, ICR patients will learn ways to improve communication skills. The purpose of this lesson is to expose patients to ways of understanding how one's mood and health are intimately connected. Developing a healthy outlook can help build positive relationships and connections with others. Patients will understand the importance of utilizing effective communication skills that include actively listening and being heard. They will learn and understand the importance of the "4 Cs" and especially Connections in fostering of a Healthy Mind-Set.  Healthy Sleep for a Healthy Heart Clinical staff led group instruction and group discussion with PowerPoint presentation and patient guidebook. To enhance the learning  environment the use of posters, models and videos may be added. At the conclusion of this workshop, patients will be able to demonstrate knowledge of the importance of sleep to overall health, well-being, and quality of life. They will understand the symptoms of, and treatments for, common sleep disorders. Patients will also be able to identify daytime and nighttime behaviors which impact sleep, and they will be able to apply these tools to help manage sleep-related challenges. The purpose of this lesson is to provide patients with a general overview of sleep and outline the importance of quality sleep. Patients will learn about a few of the most common sleep disorders. Patients will also be introduced to the concept of "sleep hygiene," and discover ways to self-manage certain sleeping problems through simple daily behavior changes. Finally, the workshop will motivate patients by clarifying the links between quality sleep and their goals of heart-healthy living.   Recognizing and Reducing Stress Clinical staff led group instruction and group discussion with  PowerPoint presentation and patient guidebook. To enhance the learning environment the use of posters, models and videos may be added. At the conclusion of this workshop, patients will be able to understand the types of stress reactions, differentiate between acute and chronic stress, and recognize the impact that chronic stress has on their health. They will also be able to apply different coping mechanisms, such as reframing negative self-talk. Patients will have the opportunity to practice a variety of stress management techniques, such as deep abdominal breathing, progressive muscle relaxation, and/or guided imagery.  The purpose of this lesson is to educate patients on the role of stress in their lives and to provide healthy techniques for coping with it.  Learning Barriers/Preferences:  Learning Barriers/Preferences - 03/06/22 1348       Learning Barriers/Preferences   Learning Barriers Sight;Hearing   wears glasses and hearing aids   Learning Preferences Skilled Demonstration;Individual Instruction             Education Topics:  Knowledge Questionnaire Score:  Knowledge Questionnaire Score - 03/06/22 1211       Knowledge Questionnaire Score   Pre Score 17/24             Core Components/Risk Factors/Patient Goals at Admission:  Personal Goals and Risk Factors at Admission - 03/06/22 1211       Core Components/Risk Factors/Patient Goals on Admission   Hypertension Yes    Intervention Provide education on lifestyle modifcations including regular physical activity/exercise, weight management, moderate sodium restriction and increased consumption of fresh fruit, vegetables, and low fat dairy, alcohol moderation, and smoking cessation.;Monitor prescription use compliance.    Expected Outcomes Short Term: Continued assessment and intervention until BP is < 140/12m HG in hypertensive participants. < 130/815mHG in hypertensive participants with diabetes, heart failure or chronic  kidney disease.;Long Term: Maintenance of blood pressure at goal levels.    Lipids Yes    Intervention Provide education and support for participant on nutrition & aerobic/resistive exercise along with prescribed medications to achieve LDL '70mg'$ , HDL >'40mg'$ .    Expected Outcomes Short Term: Participant states understanding of desired cholesterol values and is compliant with medications prescribed. Participant is following exercise prescription and nutrition guidelines.;Long Term: Cholesterol controlled with medications as prescribed, with individualized exercise RX and with personalized nutrition plan. Value goals: LDL < '70mg'$ , HDL > 40 mg.             Core Components/Risk Factors/Patient Goals Review:    Core Components/Risk Factors/Patient Goals at Discharge (Final  Review):    ITP Comments:  ITP Comments     Row Name 03/06/22 1407 03/11/22 1042         ITP Comments Dr. Loreta Ave MD, Medical Director. Introduction to Pritikin Education Program/Intensive Cardiac Rehab. Initial orientation packet reviewed with the patient. 30 Day ITP Review. Siddharth is scheduled to begin intensive cardiac rehab on 03/12/22.               Comments: See ITP comments.Harrell Gave RN BSN

## 2022-03-12 ENCOUNTER — Encounter (HOSPITAL_COMMUNITY)
Admission: RE | Admit: 2022-03-12 | Discharge: 2022-03-12 | Disposition: A | Discharge status: Home / Self Care | Payer: PPO | Source: Ambulatory Visit | Attending: Cardiology | Admitting: Cardiology

## 2022-03-12 DIAGNOSIS — Z48812 Encounter for surgical aftercare following surgery on the circulatory system: Secondary | ICD-10-CM | POA: Insufficient documentation

## 2022-03-12 DIAGNOSIS — Z955 Presence of coronary angioplasty implant and graft: Secondary | ICD-10-CM | POA: Diagnosis not present

## 2022-03-12 NOTE — Progress Notes (Signed)
Daily Session Note  Patient Details  Name: [name] MRN: [id number] Date of Birth: Dec 10, 1945 Referring Provider:   Flowsheet Row INTENSIVE CARDIAC REHAB ORIENT from 03/06/2022 in [facility] [place]  Referring Provider [name], MD       Encounter Date: 03/12/2022  Check In:  Session Check In - 03/12/22 1045       Check-In   Supervising physician immediately available to respond to emergencies [facility] MD immediately available    Physician(s) E. Barbarann Ehlers, NP    Location [facility]    Staff Present Lesly Rubenstein, MS, ACSM-CEP, CCRP, Exercise Physiologist;Tarius Stangelo, RN, Deland Pretty, MS, ACSM-CEP, Exercise Physiologist;Bailey Pearline Cables, MS, Exercise Physiologist;Johnny Starleen Blue, MS, Exercise Physiologist    Virtual Visit No    Medication changes reported     No    Fall or balance concerns reported    No    Tobacco Cessation No Change    Warm-up and Cool-down Performed as group-led instruction    Resistance Training Performed No    VAD Patient? No    PAD/SET Patient? No      Pain Assessment   Currently in Pain? No/denies    Pain Score 0-No pain    Multiple Pain Sites No             Capillary Blood Glucose: No results found for this or any previous visit (from the past 24 hour(s)).   Exercise Prescription Changes - 03/12/22 1004       Response to Exercise   Blood Pressure (Admit) 128/70    Blood Pressure (Exercise) 128/70    Blood Pressure (Exit) 102/62    Heart Rate (Admit) 61 bpm    Heart Rate (Exercise) 97 bpm    Heart Rate (Exit) 70 bpm    Rating of Perceived Exertion (Exercise) 9    Symptoms None    Comments Off to a good start with exercise.    Duration Progress to 30 minutes of  aerobic without signs/symptoms of physical distress    Intensity THRR unchanged      Progression   Progression Continue to progress workloads to maintain intensity without signs/symptoms of physical  distress.    Average METs 2.4      Resistance Training   Training Prescription No   Relaxation day, no weights     Interval Training   Interval Training No      NuStep   Level 1    SPM 93    Minutes 25    METs 2.4             Social History   Tobacco Use  Smoking Status Never  Smokeless Tobacco Never    Goals Met:  Exercise tolerated well No report of concerns or symptoms today  Goals Unmet:  Not Applicable  Comments: Pt started cardiac rehab today.  Pt tolerated light exercise without difficulty. VSS, telemetry-Sinus Rhythm,Bundle branch block, asymptomatic.  Medication list reconciled. Pt denies barriers to medicaiton compliance.  PSYCHOSOCIAL ASSESSMENT:  PHQ-5. Pt exhibits positive coping skills, hopeful outlook with supportive family. No psychosocial needs identified at this time, no psychosocial interventions necessary.    Pt enjoys spending time with his grandchildren and great grandchildren.   Pt oriented to exercise equipment and routine.    Understanding verbalized. Harrell Gave RN BSN    Dr. Fransico Him is Medical Director for Cardiac Rehab at [facility].

## 2022-03-13 ENCOUNTER — Encounter (INDEPENDENT_AMBULATORY_CARE_PROVIDER_SITE_OTHER): Payer: PPO | Admitting: Ophthalmology

## 2022-03-13 DIAGNOSIS — H35033 Hypertensive retinopathy, bilateral: Secondary | ICD-10-CM

## 2022-03-13 DIAGNOSIS — H34832 Tributary (branch) retinal vein occlusion, left eye, with macular edema: Secondary | ICD-10-CM | POA: Diagnosis not present

## 2022-03-13 DIAGNOSIS — H43813 Vitreous degeneration, bilateral: Secondary | ICD-10-CM

## 2022-03-13 DIAGNOSIS — I10 Essential (primary) hypertension: Secondary | ICD-10-CM | POA: Diagnosis not present

## 2022-03-13 DIAGNOSIS — R55 Syncope and collapse: Secondary | ICD-10-CM | POA: Diagnosis not present

## 2022-03-14 ENCOUNTER — Encounter (HOSPITAL_COMMUNITY)
Admission: RE | Admit: 2022-03-14 | Discharge: 2022-03-14 | Disposition: A | Discharge status: Home / Self Care | Payer: PPO | Source: Ambulatory Visit | Attending: Cardiology | Admitting: Cardiology

## 2022-03-14 DIAGNOSIS — Z955 Presence of coronary angioplasty implant and graft: Secondary | ICD-10-CM

## 2022-03-14 DIAGNOSIS — Z48812 Encounter for surgical aftercare following surgery on the circulatory system: Secondary | ICD-10-CM | POA: Diagnosis not present

## 2022-03-15 DIAGNOSIS — Z4509 Encounter for adjustment and management of other cardiac device: Secondary | ICD-10-CM | POA: Diagnosis not present

## 2022-03-15 DIAGNOSIS — Z95818 Presence of other cardiac implants and grafts: Secondary | ICD-10-CM | POA: Diagnosis not present

## 2022-03-15 DIAGNOSIS — R55 Syncope and collapse: Secondary | ICD-10-CM | POA: Diagnosis not present

## 2022-03-17 ENCOUNTER — Other Ambulatory Visit: Payer: Self-pay | Admitting: Cardiology

## 2022-03-17 ENCOUNTER — Encounter (HOSPITAL_COMMUNITY)
Admission: RE | Admit: 2022-03-17 | Discharge: 2022-03-17 | Disposition: A | Discharge status: Home / Self Care | Payer: PPO | Source: Ambulatory Visit | Attending: Cardiology | Admitting: Cardiology

## 2022-03-17 DIAGNOSIS — Z955 Presence of coronary angioplasty implant and graft: Secondary | ICD-10-CM

## 2022-03-17 DIAGNOSIS — Z48812 Encounter for surgical aftercare following surgery on the circulatory system: Secondary | ICD-10-CM | POA: Diagnosis not present

## 2022-03-18 ENCOUNTER — Ambulatory Visit: Payer: PPO | Admitting: Internal Medicine

## 2022-03-18 ENCOUNTER — Encounter: Payer: Self-pay | Admitting: Internal Medicine

## 2022-03-18 VITALS — BP 120/68 | HR 78 | Ht 66.0 in | Wt 187.0 lb

## 2022-03-18 DIAGNOSIS — K5904 Chronic idiopathic constipation: Secondary | ICD-10-CM

## 2022-03-18 DIAGNOSIS — K222 Esophageal obstruction: Secondary | ICD-10-CM | POA: Diagnosis not present

## 2022-03-18 DIAGNOSIS — Z1211 Encounter for screening for malignant neoplasm of colon: Secondary | ICD-10-CM

## 2022-03-18 DIAGNOSIS — K2100 Gastro-esophageal reflux disease with esophagitis, without bleeding: Secondary | ICD-10-CM | POA: Diagnosis not present

## 2022-03-18 NOTE — Patient Instructions (Signed)
_______________________________________________________  If you are age 78 or older, your body mass index should be between 23-30. Your Body mass index is 30.18 kg/m. If this is out of the aforementioned range listed, please consider follow up with your Primary Care Provider.  If you are age 59 or younger, your body mass index should be between 19-25. Your Body mass index is 30.18 kg/m. If this is out of the aformentioned range listed, please consider follow up with your Primary Care Provider.   ________________________________________________________  The [place] [facility] providers would like to encourage you to use [employer] to communicate with providers for non-urgent requests or questions.  Due to long hold times on the telephone, sending your provider a message by [facility] may be a faster and more efficient way to get a response.  Please allow 48 business hours for a response.  Please remember that this is for non-urgent requests.  _______________________________________________________  Please follow up as needed

## 2022-03-18 NOTE — Progress Notes (Signed)
HISTORY OF PRESENT ILLNESS:  [name] is a 77 y.o. male with multiple significant medical problems including prior coronary artery bypass surgery, ICD placement, cardiac intervention with stenting October 2023, on Plavix.  Has been seen in this office previously for GERD complicated by peptic stricture for which she underwent esophageal dilation in 2003.  He presents today regarding possible follow-up screening colonoscopy after receiving a recall letter.  Patient underwent colonoscopy in 2008.  Small adenoma.  Repeat colonoscopy in 2013 revealed mild diverticulosis but was otherwise normal.  Patient has no acute GI complaints.  Does take PPI for his GERD.  This works.  No recurrent dysphagia.  Does have chronic constipation of many years which is unchanged.  Prunes work best.  No bleeding.  Weight is stable.  He goes to cardiac rehab.  REVIEW OF SYSTEMS:  All non-GI ROS negative as otherwise stated in the HPI except for back pain, hearing problems, headaches, lower extremity edema, urinary leakage, voice change, shortness of breath  Past Medical History:  Diagnosis Date   Anginal pain ([place])    Arthritis    Basal cell carcinoma    back of neck   Blood transfusion without reported diagnosis 2006   during open heart surgery   CAD (coronary artery disease)    Diverticulosis 2013   Noted on Colonoscopy   Dyspnea    with activity and walking, sometime has SOB walking to mailbox and back to the house   GERD (gastroesophageal reflux disease)    History of colon polyps 2008   Noted on Colonoscopy   History of esophageal stricture 2003   Noted on EGD   Hyperlipidemia    Hypertension    IVCD (intraventricular conduction defect) 05/2018   Noted on EKG   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   Noted on EKG   Left axis deviation 05/2018   Noted on EKG   Lipoma 04/20/2018   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   Loop Biotronik Biomonitor III for syncope and collapse  04/09/2021 04/09/2021   LVH (left ventricular hypertrophy) 05/2018   Noted on EKG   Phimosis    Pneumonia    Syncope and collapse    Tuberculosis 1956   Wears glasses    Wheezing     Past Surgical History:  Procedure Laterality Date   ANGIOPLASTY  2010   3 stents placed   BACK SURGERY     CARDIAC CATHETERIZATION     CATARACT EXTRACTION W/ INTRAOCULAR LENS  IMPLANT, BILATERAL  2012   bilateral   CIRCUMCISION N/A 08/30/2018   Procedure: CIRCUMCISION ADULT;  Surgeon: Kathie Rhodes, MD;  Location: [place];  Service: Urology;  Laterality: N/A;   COLONOSCOPY  2013, 2008   CORONARY ANGIOPLASTY     CORONARY ARTERY BYPASS GRAFT  2006   x4   CORONARY ATHERECTOMY N/A 12/24/2021   Procedure: CORONARY ATHERECTOMY;  Surgeon: [name], MD;  Location: [place] [facility];  Service: Cardiovascular;  Laterality: N/A;   CORONARY STENT INTERVENTION N/A 12/24/2021   Procedure: CORONARY STENT INTERVENTION;  Surgeon: [name], MD;  Location: [place] [facility];  Service: Cardiovascular;  Laterality: N/A;   ESOPHAGOGASTRODUODENOSCOPY  10/2011   EYE SURGERY     JOINT REPLACEMENT     KNEE ARTHROSCOPY  2001   LEFT HEART CATH AND CORS/GRAFTS ANGIOGRAPHY N/A 12/24/2021   Procedure: LEFT HEART CATH AND CORS/GRAFTS ANGIOGRAPHY;  Surgeon: [name], MD;  Location: [place] [facility];  Service: Cardiovascular;  Laterality: N/A;   LUMBAR [place] MICRODISCECTOMY N/A 07/13/2019   Procedure: Laminectomy and Foraminotomy - Lumbar Two-Lumbar Three - Lumbar Three-Lumbar Four;  Surgeon: Eustace Moore, MD;  Location: [place];  Service: Neurosurgery;  Laterality: N/A;  Laminectomy and Foraminotomy - Lumbar Two-Lumbar Three - Lumbar Three-Lumbar Four   ROTATOR CUFF REPAIR Left 05/24/2020   TENDON RECONSTRUCTION Right 10/27/2013   Procedure: RIGHT OPEN TRICEPS REPAIR;  Surgeon: Renette Butters, MD;  Location: [place];  Service:  Orthopedics;  Laterality: Right;    Social History [name]  reports that he has never smoked. He has never used smokeless tobacco. He reports that he does not drink alcohol and does not use drugs.  family history includes Colon cancer (age of onset: 71) in his father; Colon cancer (age of onset: 52) in his mother; Heart attack in his brother; Heart failure in his brother; Hypertension in his mother.  Allergies  Allergen Reactions   Codeine Nausea And Vomiting   Guaifenesin & Derivatives Nausea And Vomiting   Terbinafine And Related Other (See Comments)    Muscle pain   Iodinated Contrast Media Itching   Penicillins Rash    Tolerates Cefazolin without problems...[employer]       PHYSICAL EXAMINATION: Vital signs: BP 120/68   Pulse 78   Ht '5\' 6"'$  (1.676 m)   Wt 187 lb (84.8 kg)   SpO2 98%   BMI 30.18 kg/m   Constitutional: generally well-appearing, no acute distress Psychiatric: alert and oriented x3, cooperative Eyes: extraocular movements intact, anicteric, conjunctiva pink Mouth: oral pharynx moist, no lesions Neck: supple no lymphadenopathy Cardiovascular: heart regular rate and rhythm, no murmur Lungs: clear to auscultation bilaterally Abdomen: soft, nontender, nondistended, no obvious ascites, no peritoneal signs, normal bowel sounds, no organomegaly Rectal: Omitted Extremities: no clubbing or cyanosis.  1+ lower extremity edema bilaterally Skin: no lesions on visible extremities Neuro: No focal deficits.  Cranial nerves intact  ASSESSMENT:  1.  Remote history of diminutive adenoma.  Most recent colonoscopy 2013 was normal.  Question need for follow-up surveillance colonoscopy 2.  GERD complicated by peptic stricture.  Currently asymptomatic post dilation on PPI 3.  Functional constipation 4.  Multiple significant medical problems including recent coronary artery intervention, on Plavix.   PLAN:  1.  We discussed the risks and benefits of surveillance  colonoscopy at age 29 given his overall significant health issues.  He has no significant complaints or relevant laboratory abnormalities.  As well, his most recent coronary artery intervention was less than 3 months ago.  As such, I would recommend against surveillance colonoscopy at this time in the future.  He is comfortable with this recommendation. 2.  Reflux precautions 3.  Continue PPI 4.  Prunes or laxative of choice 5.  Resume general medical care with PCP

## 2022-03-19 ENCOUNTER — Encounter (HOSPITAL_COMMUNITY)
Admission: RE | Admit: 2022-03-19 | Discharge: 2022-03-19 | Disposition: A | Discharge status: Home / Self Care | Payer: PPO | Source: Ambulatory Visit | Attending: Cardiology | Admitting: Cardiology

## 2022-03-19 DIAGNOSIS — Z955 Presence of coronary angioplasty implant and graft: Secondary | ICD-10-CM

## 2022-03-19 DIAGNOSIS — Z48812 Encounter for surgical aftercare following surgery on the circulatory system: Secondary | ICD-10-CM | POA: Diagnosis not present

## 2022-03-21 ENCOUNTER — Encounter (HOSPITAL_COMMUNITY)
Admission: RE | Admit: 2022-03-21 | Discharge: 2022-03-21 | Disposition: A | Discharge status: Home / Self Care | Payer: PPO | Source: Ambulatory Visit | Attending: Cardiology | Admitting: Cardiology

## 2022-03-21 DIAGNOSIS — S76312A Strain of muscle, fascia and tendon of the posterior muscle group at thigh level, left thigh, initial encounter: Secondary | ICD-10-CM | POA: Diagnosis not present

## 2022-03-21 DIAGNOSIS — Z955 Presence of coronary angioplasty implant and graft: Secondary | ICD-10-CM

## 2022-03-21 DIAGNOSIS — S46012D Strain of muscle(s) and tendon(s) of the rotator cuff of left shoulder, subsequent encounter: Secondary | ICD-10-CM | POA: Diagnosis not present

## 2022-03-21 DIAGNOSIS — Z48812 Encounter for surgical aftercare following surgery on the circulatory system: Secondary | ICD-10-CM | POA: Diagnosis not present

## 2022-03-21 NOTE — Progress Notes (Signed)
Reviewed home exercise guidelines with patient including endpoints, temperature precautions, target heart rate and rate of perceived exertion. Patient is currently walking 15-20 minutes 2-3 days/week as his mode of home exercise. Walking is limited by bilateral leg pain. Patient has a pulse oximeter that he can use to check his pulse. Patient voices understanding of instructions given.  Sol Passer, MS, ACSM CEP

## 2022-03-24 ENCOUNTER — Encounter (HOSPITAL_COMMUNITY)
Admission: RE | Admit: 2022-03-24 | Discharge: 2022-03-24 | Disposition: A | Discharge status: Home / Self Care | Payer: PPO | Source: Ambulatory Visit | Attending: Cardiology | Admitting: Cardiology

## 2022-03-24 DIAGNOSIS — Z955 Presence of coronary angioplasty implant and graft: Secondary | ICD-10-CM

## 2022-03-24 DIAGNOSIS — Z48812 Encounter for surgical aftercare following surgery on the circulatory system: Secondary | ICD-10-CM | POA: Diagnosis not present

## 2022-03-26 ENCOUNTER — Encounter (HOSPITAL_COMMUNITY)
Admission: RE | Admit: 2022-03-26 | Discharge: 2022-03-26 | Disposition: A | Discharge status: Home / Self Care | Payer: PPO | Source: Ambulatory Visit | Attending: Cardiology | Admitting: Cardiology

## 2022-03-26 DIAGNOSIS — Z48812 Encounter for surgical aftercare following surgery on the circulatory system: Secondary | ICD-10-CM | POA: Diagnosis not present

## 2022-03-26 DIAGNOSIS — Z955 Presence of coronary angioplasty implant and graft: Secondary | ICD-10-CM

## 2022-03-28 ENCOUNTER — Encounter (HOSPITAL_COMMUNITY)
Admission: RE | Admit: 2022-03-28 | Discharge: 2022-03-28 | Disposition: A | Discharge status: Home / Self Care | Payer: PPO | Source: Ambulatory Visit | Attending: Cardiology | Admitting: Cardiology

## 2022-03-28 DIAGNOSIS — Z955 Presence of coronary angioplasty implant and graft: Secondary | ICD-10-CM

## 2022-03-28 DIAGNOSIS — Z48812 Encounter for surgical aftercare following surgery on the circulatory system: Secondary | ICD-10-CM | POA: Diagnosis not present

## 2022-03-31 ENCOUNTER — Encounter (HOSPITAL_COMMUNITY)
Admission: RE | Admit: 2022-03-31 | Discharge: 2022-03-31 | Disposition: A | Discharge status: Home / Self Care | Payer: PPO | Source: Ambulatory Visit | Attending: Cardiology | Admitting: Cardiology

## 2022-03-31 DIAGNOSIS — Z955 Presence of coronary angioplasty implant and graft: Secondary | ICD-10-CM

## 2022-03-31 DIAGNOSIS — Z48812 Encounter for surgical aftercare following surgery on the circulatory system: Secondary | ICD-10-CM | POA: Diagnosis not present

## 2022-04-02 ENCOUNTER — Encounter (HOSPITAL_COMMUNITY)
Admission: RE | Admit: 2022-04-02 | Discharge: 2022-04-02 | Disposition: A | Discharge status: Home / Self Care | Payer: PPO | Source: Ambulatory Visit | Attending: Cardiology | Admitting: Cardiology

## 2022-04-02 DIAGNOSIS — Z955 Presence of coronary angioplasty implant and graft: Secondary | ICD-10-CM

## 2022-04-02 DIAGNOSIS — Z48812 Encounter for surgical aftercare following surgery on the circulatory system: Secondary | ICD-10-CM | POA: Diagnosis not present

## 2022-04-02 NOTE — Progress Notes (Signed)
Patient reported that he fell at moving a piece of wood at home. Patient has a large bandage, wrap on his left forearm and a smaller bandage on his left forearm. [name] showed me a picture of the wound on his cell phone it appears to be a large area. [name] denies pain or discomfort at cardiac rehab. I called Dr Loren Racer office and left a message with his medical assistant Estill Bamberg to see if [name] could get an appointment to have the would looked at in the office.Barnet Pall, RN,BSN 04/02/2022 11:20 AM

## 2022-04-03 DIAGNOSIS — I1310 Hypertensive heart and chronic kidney disease without heart failure, with stage 1 through stage 4 chronic kidney disease, or unspecified chronic kidney disease: Secondary | ICD-10-CM | POA: Diagnosis not present

## 2022-04-03 DIAGNOSIS — S41112A Laceration without foreign body of left upper arm, initial encounter: Secondary | ICD-10-CM | POA: Diagnosis not present

## 2022-04-03 DIAGNOSIS — W19XXXA Unspecified fall, initial encounter: Secondary | ICD-10-CM | POA: Diagnosis not present

## 2022-04-04 ENCOUNTER — Encounter (HOSPITAL_COMMUNITY)
Admission: RE | Admit: 2022-04-04 | Discharge: 2022-04-04 | Disposition: A | Discharge status: Home / Self Care | Payer: PPO | Source: Ambulatory Visit | Attending: Cardiology | Admitting: Cardiology

## 2022-04-04 DIAGNOSIS — R55 Syncope and collapse: Secondary | ICD-10-CM | POA: Diagnosis not present

## 2022-04-04 DIAGNOSIS — Z955 Presence of coronary angioplasty implant and graft: Secondary | ICD-10-CM

## 2022-04-04 DIAGNOSIS — Z48812 Encounter for surgical aftercare following surgery on the circulatory system: Secondary | ICD-10-CM | POA: Diagnosis not present

## 2022-04-07 ENCOUNTER — Encounter (HOSPITAL_COMMUNITY)
Admission: RE | Admit: 2022-04-07 | Discharge: 2022-04-07 | Disposition: A | Discharge status: Home / Self Care | Payer: PPO | Source: Ambulatory Visit | Attending: Cardiology | Admitting: Cardiology

## 2022-04-07 DIAGNOSIS — Z955 Presence of coronary angioplasty implant and graft: Secondary | ICD-10-CM

## 2022-04-07 DIAGNOSIS — Z48812 Encounter for surgical aftercare following surgery on the circulatory system: Secondary | ICD-10-CM | POA: Diagnosis not present

## 2022-04-08 NOTE — Progress Notes (Signed)
Cardiac Individual Treatment Plan  Patient Details  Name: CHRLES SELLEY MRN: [id number] Date of Birth: 04/06/1945 Referring Provider:   Flowsheet Row INTENSIVE CARDIAC REHAB ORIENT from 03/06/2022 in [facility] for Heart, Vascular, & [place]  Referring Provider [name], MD       Initial Encounter Date:  [place] from 03/06/2022 in [facility] for Heart, Vascular, & Lung [facility]  Date 03/06/22       Visit Diagnosis: 12/24/2021 DES x 2 Cx/LM  Patient's Home Medications on Admission:  Current Outpatient Medications:    acetaminophen (TYLENOL) 500 MG tablet, Take 1,000 mg by mouth every morning. Additional 1000 mg if needed during the day, Disp: , Rfl:    aspirin EC 81 MG tablet, Take 81 mg by mouth every evening. Swallow whole., Disp: , Rfl:    Calcium Carbonate Antacid (TUMS ULTRA PO), Take 2 tablets by mouth daily as needed (acid reflux/indigestion.)., Disp: , Rfl:    clopidogrel (PLAVIX) 75 MG tablet, TAKE 1 TABLET BY MOUTH DAILY WITH BREAKFAST., Disp: 90 tablet, Rfl: 1   Coenzyme Q10 (COQ10) 100 MG CAPS, Take 100 mg by mouth 2 (two) times daily., Disp: , Rfl:    Cyanocobalamin (VITAMIN B-12) 5000 MCG TBDP, Take 5,000 mcg by mouth every evening., Disp: , Rfl:    ezetimibe (ZETIA) 10 MG tablet, TAKE 1/2 TABLET BY MOUTH EVERY EVENING, Disp: 45 tablet, Rfl: 11   hydrALAZINE (APRESOLINE) 25 MG tablet, Take 1 tablet (25 mg total) by mouth 3 (three) times daily as needed (For SBP >140 mm Hg)., Disp: 270 tablet, Rfl: 3   losartan (COZAAR) 25 MG tablet, TAKE 1/2 TABLET BY MOUTH EVERY EVENING, Disp: 90 tablet, Rfl: 3   metoprolol succinate (TOPROL-XL) 25 MG 24 hr tablet, Take 0.5 tablets (12.5 mg total) by mouth every morning., Disp: 90 tablet, Rfl: 3   Multiple Vitamin (MULTIVITAMIN) tablet, Take 1 tablet by mouth in the morning. Seniors, Disp: , Rfl:    Multiple Vitamins-Minerals (PRESERVISION AREDS  2) CAPS, Take 1 capsule by mouth 2 (two) times daily., Disp: , Rfl:    nitroGLYCERIN (NITROSTAT) 0.4 MG SL tablet, TAKE 1 TABLET EVERY 5 MINUTES AS NEEDED FOR CHEST PAIN. (Patient taking differently: Place 0.4 mg under the tongue every 5 (five) minutes as needed for chest pain.), Disp: 25 tablet, Rfl: 3   nortriptyline (PAMELOR) 25 MG capsule, TAKE 1 CAPSULE BY MOUTH AT BEDTIME. (Patient taking differently: Take 25 mg by mouth at bedtime as needed (Headache).), Disp: 90 capsule, Rfl: 1   omega-3 acid ethyl esters (LOVAZA) 1 g capsule, Take 1 g by mouth 2 (two) times daily., Disp: , Rfl:    pantoprazole (PROTONIX) 40 MG tablet, Take 1 tablet (40 mg total) by mouth daily., Disp: 30 tablet, Rfl: 1   Polyethyl Glycol-Propyl Glycol (SYSTANE) 0.4-0.3 % GEL ophthalmic gel, Place 1 application into both eyes at bedtime., Disp: , Rfl:    Propylene Glycol (SYSTANE BALANCE) 0.6 % SOLN, Place 1 drop into both eyes 3 (three) times daily as needed (dry/irritated eyes.)., Disp: , Rfl:    pyridoxine (B-6) 100 MG tablet, Take 100 mg by mouth every evening., Disp: , Rfl:    rosuvastatin (CRESTOR) 20 MG tablet, TAKE ONE TABLET BY MOUTH EVERYDAY AT BEDTIME, Disp: 90 tablet, Rfl: 3   vitamin C (ASCORBIC ACID) 500 MG tablet, Take 500 mg by mouth daily., Disp: , Rfl:   Past Medical History: Past Medical History:  Diagnosis Date   Anginal pain ([place])    Arthritis    Basal cell carcinoma    back of neck   Blood transfusion without reported diagnosis 2006   during open heart surgery   CAD (coronary artery disease)    Diverticulosis 2013   Noted on Colonoscopy   Dyspnea    with activity and walking, sometime has SOB walking to mailbox and back to the house   GERD (gastroesophageal reflux disease)    History of colon polyps 2008   Noted on Colonoscopy   History of esophageal stricture 2003   Noted on EGD   Hyperlipidemia    Hypertension    IVCD (intraventricular conduction defect) 05/2018   Noted on EKG   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   Noted on EKG   Left axis deviation 05/2018   Noted on EKG   Lipoma 04/20/2018   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   Loop Biotronik Biomonitor III for syncope and collapse 04/09/2021 04/09/2021   LVH (left ventricular hypertrophy) 05/2018   Noted on EKG   Phimosis    Pneumonia    Syncope and collapse    Tuberculosis 1956   Wears glasses    Wheezing     Tobacco Use: Social History   Tobacco Use  Smoking Status Never  Smokeless Tobacco Never    Labs: Review Flowsheet       Latest Ref Rng & Units 10/27/2013 03/16/2016 09/11/2021  Labs for ITP Cardiac and Pulmonary Rehab  Cholestrol 0 - 200 mg/dL - 84  -  LDL (calc) 0 - 99 mg/dL - 39  -  HDL-C >40 mg/dL - 32  -  Trlycerides <150 mg/dL - 67  -  TCO2 22 - 32 mmol/L 26  - 24     Capillary Blood Glucose: No results found for: "GLUCAP"   Exercise Target Goals: Exercise Program Goal: Individual exercise prescription set using results from initial 6 min walk test and THRR while considering  patient's activity barriers and safety.   Exercise Prescription Goal: Initial exercise prescription builds to 30-45 minutes a day of aerobic activity, 2-3 days per week.  Home exercise guidelines will be given to patient during program as part of exercise prescription that the participant will acknowledge.  Activity Barriers & Risk Stratification:  Activity Barriers & Cardiac Risk Stratification - 03/06/22 1226       Activity Barriers & Cardiac Risk Stratification   Activity Barriers Arthritis;Back Problems;Neck/Spine Problems;Joint Problems;Deconditioning;Muscular Weakness;Shortness of Breath;Chest Pain/Angina;Balance Concerns;History of Falls;Assistive Device    Cardiac Risk Stratification High             6 Minute Walk:  6 Minute Walk     Row Name 03/06/22 1128         6 Minute Walk   Phase Initial     Distance 1542 feet  Nustep used /chronic low back and hip pain      Walk Time 6 minutes  .38 km (1542 ft) - total distance from nustep     # of Rest Breaks 0     MPH 2.92     METS 21     RPE 11     Perceived Dyspnea  1     VO2 Peak 8.95     Symptoms Yes (comment)     Comments Headache, chronic low back pain/bilateral hip pain 3-4/10     Resting HR 71 bpm     Resting BP 110/60  Resting Oxygen Saturation  99 %     Exercise Oxygen Saturation  during 6 min walk 96 %     Max Ex. HR 74 bpm     Max Ex. BP 136/60     2 Minute Post BP 112/66              Oxygen Initial Assessment:   Oxygen Re-Evaluation:   Oxygen Discharge (Final Oxygen Re-Evaluation):   Initial Exercise Prescription:  Initial Exercise Prescription - 03/06/22 1300       Date of Initial Exercise RX and Referring Provider   Date 03/06/22    Referring Provider [name], MD    Expected Discharge Date 05/02/22      NuStep   Level 1    SPM 80    Minutes 15    METs 2.6      Prescription Details   Frequency (times per week) 3    Duration Progress to 30 minutes of continuous aerobic without signs/symptoms of physical distress      Intensity   THRR 40-80% of Max Heartrate 57-114    Ratings of Perceived Exertion 11-13    Perceived Dyspnea 0-4      Progression   Progression Continue progressive overload as per policy without signs/symptoms or physical distress.      Resistance Training   Training Prescription Yes    Weight 2 lbs    Reps 10-15             Perform Capillary Blood Glucose checks as needed.  Exercise Prescription Changes:   Exercise Prescription Changes     Row Name 03/12/22 1004 04/04/22 1013           Response to Exercise   Blood Pressure (Admit) 128/70 102/62      Blood Pressure (Exercise) 128/70 120/72      Blood Pressure (Exit) 102/62 118/70      Heart Rate (Admit) 61 bpm 76 bpm      Heart Rate (Exercise) 97 bpm 75 bpm      Heart Rate (Exit) 70 bpm 71 bpm      Rating of Perceived Exertion (Exercise) 9 12      Symptoms None None       Comments Off to a good start with exercise. --      Duration Progress to 30 minutes of  aerobic without signs/symptoms of physical distress Progress to 30 minutes of  aerobic without signs/symptoms of physical distress      Intensity THRR unchanged THRR unchanged        Progression   Progression Continue to progress workloads to maintain intensity without signs/symptoms of physical distress. Continue to progress workloads to maintain intensity without signs/symptoms of physical distress.      Average METs 2.4 2.1        Resistance Training   Training Prescription No  Relaxation day, no weights Yes      Weight -- 2 lbs      Reps -- 10-15      Time -- 10 Minutes        Interval Training   Interval Training No No        NuStep   Level 1 1      SPM 93 99      Minutes 25 30      METs 2.4 2.1        Home Exercise Plan   Plans to continue exercise at -- Home (comment)  Walking  Frequency -- Add 3 additional days to program exercise sessions.      Initial Home Exercises Provided -- 03/21/22               Exercise Comments:   Exercise Comments     Row Name 03/12/22 1118 03/21/22 1050 04/07/22 1103       Exercise Comments Patient tolerated low intensity exercise well without symptoms. Oriented to warm-up and cool-down stretches. Handout given. Reviewed home exercise guidelines and goals with patient. Reviewed METs with patient.              Exercise Goals and Review:   Exercise Goals     Row Name 03/06/22 1226             Exercise Goals   Increase Physical Activity Yes       Intervention Provide advice, education, support and counseling about physical activity/exercise needs.;Develop an individualized exercise prescription for aerobic and resistive training based on initial evaluation findings, risk stratification, comorbidities and participant's personal goals.       Expected Outcomes Short Term: Attend rehab on a regular basis to increase amount of  physical activity.;Long Term: Add in home exercise to make exercise part of routine and to increase amount of physical activity.;Long Term: Exercising regularly at least 3-5 days a week.       Increase Strength and Stamina Yes       Intervention Provide advice, education, support and counseling about physical activity/exercise needs.;Develop an individualized exercise prescription for aerobic and resistive training based on initial evaluation findings, risk stratification, comorbidities and participant's personal goals.       Expected Outcomes Short Term: Increase workloads from initial exercise prescription for resistance, speed, and METs.;Long Term: Improve cardiorespiratory fitness, muscular endurance and strength as measured by increased METs and functional capacity (6MWT);Short Term: Perform resistance training exercises routinely during rehab and add in resistance training at home       Able to understand and use rate of perceived exertion (RPE) scale Yes       Intervention Provide education and explanation on how to use RPE scale       Expected Outcomes Short Term: Able to use RPE daily in rehab to express subjective intensity level;Long Term:  Able to use RPE to guide intensity level when exercising independently       Knowledge and understanding of Target Heart Rate Range (THRR) Yes       Intervention Provide education and explanation of THRR including how the numbers were predicted and where they are located for reference       Expected Outcomes Short Term: Able to state/look up THRR;Short Term: Able to use daily as guideline for intensity in rehab;Long Term: Able to use THRR to govern intensity when exercising independently       Understanding of Exercise Prescription Yes       Intervention Provide education, explanation, and written materials on patient's individual exercise prescription       Expected Outcomes Short Term: Able to explain program exercise prescription;Long Term: Able to  explain home exercise prescription to exercise independently                Exercise Goals Re-Evaluation :  Exercise Goals Re-Evaluation     Row Name 03/12/22 1118 03/21/22 1050           Exercise Goal Re-Evaluation   Exercise Goals Review Increase Physical Activity;Able to understand and use rate of perceived exertion (RPE) scale;Increase Strength and Stamina  Increase Physical Activity;Able to understand and use rate of perceived exertion (RPE) scale;Increase Strength and Stamina;Understanding of Exercise Prescription;Knowledge and understanding of Target Heart Rate Range (THRR);Able to check pulse independently      Comments Patient able to understand and use RPE scale appropriately. Reviewed exercise prescription with patient. Patient is walking 15-20 minutes, 2-3 days/week. Patient has a pulse oximetere to check his pulse.      Expected Outcomes Progress workloads as tolerated to help increase strength and stamina. Patient will continue walking at home in addition to exercise at cardiac rehab.               Discharge Exercise Prescription (Final Exercise Prescription Changes):  Exercise Prescription Changes - 04/04/22 1013       Response to Exercise   Blood Pressure (Admit) 102/62    Blood Pressure (Exercise) 120/72    Blood Pressure (Exit) 118/70    Heart Rate (Admit) 76 bpm    Heart Rate (Exercise) 75 bpm    Heart Rate (Exit) 71 bpm    Rating of Perceived Exertion (Exercise) 12    Symptoms None    Duration Progress to 30 minutes of  aerobic without signs/symptoms of physical distress    Intensity THRR unchanged      Progression   Progression Continue to progress workloads to maintain intensity without signs/symptoms of physical distress.    Average METs 2.1      Resistance Training   Training Prescription Yes    Weight 2 lbs    Reps 10-15    Time 10 Minutes      Interval Training   Interval Training No      NuStep   Level 1    SPM 99    Minutes 30     METs 2.1      Home Exercise Plan   Plans to continue exercise at Home (comment)   Walking   Frequency Add 3 additional days to program exercise sessions.    Initial Home Exercises Provided 03/21/22             Nutrition:  Target Goals: Understanding of nutrition guidelines, daily intake of sodium '1500mg'$ , cholesterol '200mg'$ , calories 30% from fat and 7% or less from saturated fats, daily to have 5 or more servings of fruits and vegetables.  Biometrics:  Pre Biometrics - 03/06/22 1045       Pre Biometrics   Waist Circumference 43 inches    Hip Circumference 41.5 inches    Waist to Hip Ratio 1.04 %    Triceps Skinfold 13 mm    % Body Fat 29.8 %    Grip Strength 29 kg    Flexibility --   Not done due to back problems/chronic back pain   Single Leg Stand --   not performed. Pt with cane, chronic back/hip pain             Nutrition Therapy Plan and Nutrition Goals:  Nutrition Therapy & Goals - 03/14/22 1142       Nutrition Therapy   Diet Heart Healthy Diet    Drug/Food Interactions Statins/Certain Fruits      Personal Nutrition Goals   Nutrition Goal Patient to identify strategies for reducing cardiovascular risk by attending the weekly Pritikin education classes    Personal Goal #2 Patient to improve diet quality by using the plate method as a daily guide for meal planning to include lean protein/plant protein, fruits, vegetables, whole grains, and nonfat diary as part of balanced  diet    Personal Goal #3 Patient to reduce sodium intake to '1500mg'$  per day.      Intervention Plan   Intervention Prescribe, educate and counsel regarding individualized specific dietary modifications aiming towards targeted core components such as weight, hypertension, lipid management, diabetes, heart failure and other comorbidities.;Nutrition handout(s) given to patient.    Expected Outcomes Short Term Goal: Understand basic principles of dietary content, such as calories, fat, sodium,  cholesterol and nutrients.;Long Term Goal: Adherence to prescribed nutrition plan.             Nutrition Assessments:  Nutrition Assessments - 03/13/22 1043       Rate Your Plate Scores   Pre Score 58            MEDIFICTS Score Key: ?70 Need to make dietary changes  40-70 Heart Healthy Diet ? 40 Therapeutic Level Cholesterol Diet   Flowsheet Row INTENSIVE CARDIAC REHAB from 03/12/2022 in [facility] for Heart, Vascular, & Lung Health  Picture Your Plate Total Score on Admission 58      Picture Your Plate Scores: <71 Unhealthy dietary pattern with much room for improvement. 41-50 Dietary pattern unlikely to meet recommendations for good health and room for improvement. 51-60 More healthful dietary pattern, with some room for improvement.  >60 Healthy dietary pattern, although there may be some specific behaviors that could be improved.    Nutrition Goals Re-Evaluation:  Nutrition Goals Re-Evaluation     [place] Name 03/14/22 1142             Goals   Current Weight 186 lb 15.2 oz (84.8 kg)       Comment lipoprotein A 216, A1c 5.9, triglyerides 152       Expected Outcome Zorian will benefit from participation in Intensive cardiac rehab for nutrition education, exercise, and lifestyle modification.                Nutrition Goals Re-Evaluation:  Nutrition Goals Re-Evaluation     [place] Name 03/14/22 1142             Goals   Current Weight 186 lb 15.2 oz (84.8 kg)       Comment lipoprotein A 216, A1c 5.9, triglyerides 152       Expected Outcome Viyaan will benefit from participation in Intensive cardiac rehab for nutrition education, exercise, and lifestyle modification.                Nutrition Goals Discharge (Final Nutrition Goals Re-Evaluation):  Nutrition Goals Re-Evaluation - 03/14/22 1142       Goals   Current Weight 186 lb 15.2 oz (84.8 kg)    Comment lipoprotein A 216, A1c 5.9, triglyerides 152    Expected Outcome  Sharad will benefit from participation in Intensive cardiac rehab for nutrition education, exercise, and lifestyle modification.             Psychosocial: Target Goals: Acknowledge presence or absence of significant depression and/or stress, maximize coping skills, provide positive support system. Participant is able to verbalize types and ability to use techniques and skills needed for reducing stress and depression.  Initial Review & Psychosocial Screening:  Initial Psych Review & Screening - 03/06/22 1409       Initial Review   Current issues with None Identified      Family Dynamics   Good Support System? Yes   Pt has wife for support     Barriers   Psychosocial barriers to participate  in program There are no identifiable barriers or psychosocial needs.      Screening Interventions   Interventions Encouraged to exercise             Quality of Life Scores:  Quality of Life - 03/06/22 1347       Quality of Life   Select Quality of Life      Quality of Life Scores   Health/Function Pre 21.3 %    Socioeconomic Pre 28.33 %    Psych/Spiritual Pre 28.21 %    Family Pre 26.4 %    GLOBAL Pre 24.82 %            Scores of 19 and below usually indicate a poorer quality of life in these areas.  A difference of  2-3 points is a clinically meaningful difference.  A difference of 2-3 points in the total score of the Quality of Life Index has been associated with significant improvement in overall quality of life, self-image, physical symptoms, and general health in studies assessing change in quality of life.  PHQ-9: Review Flowsheet       03/06/2022  Depression screen PHQ 2/9  Decreased Interest 2  Down, Depressed, Hopeless 0  PHQ - 2 Score 2  Altered sleeping 0  Tired, decreased energy 1  Change in appetite 1  Feeling bad or failure about yourself  0  Trouble concentrating 0  Moving slowly or fidgety/restless 1  Suicidal thoughts 0  PHQ-9 Score 5  Difficult  doing work/chores Not difficult at all   Interpretation of Total Score  Total Score Depression Severity:  1-4 = Minimal depression, 5-9 = Mild depression, 10-14 = Moderate depression, 15-19 = Moderately severe depression, 20-27 = Severe depression   Psychosocial Evaluation and Intervention:   Psychosocial Re-Evaluation:  Psychosocial Re-Evaluation     [place] Name 03/14/22 1054             Psychosocial Re-Evaluation   Current issues with None Identified       Interventions Encouraged to attend Cardiac Rehabilitation for the exercise       Continue Psychosocial Services  No Follow up required                Psychosocial Discharge (Final Psychosocial Re-Evaluation):  Psychosocial Re-Evaluation - 03/14/22 1054       Psychosocial Re-Evaluation   Current issues with None Identified    Interventions Encouraged to attend Cardiac Rehabilitation for the exercise    Continue Psychosocial Services  No Follow up required             Vocational Rehabilitation: Provide vocational rehab assistance to qualifying candidates.   Vocational Rehab Evaluation & Intervention:  Vocational Rehab - 03/06/22 1409       Initial Vocational Rehab Evaluation & Intervention   Assessment shows need for Vocational Rehabilitation No   Pt is retired and does not need vocational rehab at this time            Education: Education Goals: Education classes will be provided on a weekly basis, covering required topics. Participant will state understanding/return demonstration of topics presented.    Education     Row Name 03/12/22 1300     Education   Cardiac Education Topics Pritikin   Financial trader   Weekly Topic Fast and Healthy Breakfasts   Instruction Review Code 1- Verbalizes Understanding   Class Start Time 1145   Class Stop Time  1225   Class Time Calculation (min) 40 min    Row Name 03/14/22 1300     Education   Cardiac  Education Topics Pritikin   Select Core Videos     Core Videos   Educator Dietitian   Select Nutrition   Nutrition Overview of the [place] Eating Plan   Instruction Review Code 1- Verbalizes Understanding   Class Start Time 1140   Class Stop Time 1215   Class Time Calculation (min) 35 min    [place] Name 03/17/22 1200     Education   Cardiac Education Topics Pritikin   Select Workshops     Workshops   Educator Exercise Physiologist   Select Exercise   Exercise Workshop Hotel manager and Fall Prevention   Instruction Review Code 1- Verbalizes Understanding   Class Start Time 1140   Class Stop Time 1221   Class Time Calculation (min) 41 min    [place] Name 03/19/22 1300     Education   Cardiac Education Topics Pritikin   Financial trader   Weekly Topic Personalizing Your Pritikin Plate   Instruction Review Code 1- Verbalizes Understanding   Class Start Time 1145   Class Stop Time 1227   Class Time Calculation (min) 42 min    [place] Name 03/21/22 1200     Education   Cardiac Education Topics Pritikin   Charity fundraiser Exercise Physiologist   Select Psychosocial   Psychosocial Healthy Minds, Bodies, Hearts   Instruction Review Code 1- Verbalizes Understanding   Class Start Time 1140   Class Stop Time 1216   Class Time Calculation (min) 36 min    [place] Name 03/24/22 1300     Education   Cardiac Education Topics Pritikin   IT sales professional Nutrition   Nutrition Workshop Label Reading   Instruction Review Code 1- Verbalizes Understanding   Class Start Time 1145   Class Stop Time 1230   Class Time Calculation (min) 45 min    [place] Name 03/26/22 1300     Education   Cardiac Education Topics Pritikin   Financial trader   Weekly Topic [employer] Desserts   Instruction Review Code 1- Verbalizes  Understanding   Class Start Time 1138   Class Stop Time 1225   Class Time Calculation (min) 47 min    [place] Name 03/28/22 1200     Education   Cardiac Education Topics Pritikin   Select Core Videos     Core Videos   Educator Dietitian   Nutrition Other  Label Reading   Instruction Review Code 1- Verbalizes Understanding   Class Start Time 1140   Class Stop Time 1225   Class Time Calculation (min) 45 min    [place] Name 03/31/22 1338     Education   Cardiac Education Topics Pritikin   Select Workshops     Workshops   Educator Exercise Physiologist   Select Psychosocial   Psychosocial Workshop Other  Focused goals and sustainable changes   Instruction Review Code 1- Verbalizes Understanding   Class Start Time 1138   Class Stop Time 1220   Class Time Calculation (min) 42 min    [place] Name 04/02/22 1600     Education   Cardiac Education Topics Pritikin   Select  Higher education careers adviser and Snacks   Instruction Review Code 1- Verbalizes Understanding   Class Start Time 7106   Class Stop Time 1221   Class Time Calculation (min) 36 min    [place] Name 04/04/22 1300     Education   Cardiac Education Topics Pritikin   Scientist, research (life sciences)   Educator Dietitian   Select Nutrition   Nutrition Calorie Density   Instruction Review Code 1- Verbalizes Understanding   Class Start Time 1142   Class Stop Time 1223   Class Time Calculation (min) 41 min    [place] Name 04/07/22 [place]   Select Workshops     Workshops   Educator Exercise Physiologist   Select Exercise   Exercise Workshop Exercise Basics: Building Your Action Plan   Instruction Review Code 1- Verbalizes Understanding   Class Start Time 1148   Class Stop Time 1232   Class Time Calculation (min) 44 min            Core Videos: Exercise    Move It!  Clinical staff conducted group or  individual video education with verbal and written material and guidebook.  Patient learns the recommended Pritikin exercise program. Exercise with the goal of living a long, healthy life. Some of the health benefits of exercise include controlled diabetes, healthier blood pressure levels, improved cholesterol levels, improved heart and lung capacity, improved sleep, and better body composition. Everyone should speak with their doctor before starting or changing an exercise routine.  Biomechanical Limitations Clinical staff conducted group or individual video education with verbal and written material and guidebook.  Patient learns how biomechanical limitations can impact exercise and how we can mitigate and possibly overcome limitations to have an impactful and balanced exercise routine.  Body Composition Clinical staff conducted group or individual video education with verbal and written material and guidebook.  Patient learns that body composition (ratio of muscle mass to fat mass) is a key component to assessing overall fitness, rather than body weight alone. Increased fat mass, especially visceral belly fat, can put [place] at increased risk for metabolic syndrome, type 2 diabetes, heart disease, and even death. It is recommended to combine diet and exercise (cardiovascular and resistance training) to improve your body composition. Seek guidance from your physician and exercise physiologist before implementing an exercise routine.  Exercise Action Plan Clinical staff conducted group or individual video education with verbal and written material and guidebook.  Patient learns the recommended strategies to achieve and enjoy long-term exercise adherence, including variety, self-motivation, self-efficacy, and positive decision making. Benefits of exercise include fitness, good health, weight management, more energy, better sleep, less stress, and overall well-being.  Medical   Heart Disease Risk  Reduction Clinical staff conducted group or individual video education with verbal and written material and guidebook.  Patient learns our heart is our most vital organ as it circulates oxygen, nutrients, white blood cells, and hormones throughout the entire body, and carries waste away. Data supports a plant-based eating plan like the Pritikin Program for its effectiveness in slowing progression of and reversing heart disease. The video provides a number of recommendations to address heart disease.   Metabolic Syndrome and Belly Fat  Clinical staff conducted group or individual video education with verbal and written material and guidebook.  Patient learns what metabolic syndrome  is, how it leads to heart disease, and how one can reverse it and keep it from coming back. You have metabolic syndrome if you have 3 of the following 5 criteria: abdominal obesity, high blood pressure, high triglycerides, low HDL cholesterol, and high blood sugar.  Hypertension and Heart Disease Clinical staff conducted group or individual video education with verbal and written material and guidebook.  Patient learns that high blood pressure, or hypertension, is very common in the [place]. Hypertension is largely due to excessive salt intake, but other important risk factors include being overweight, physical inactivity, drinking too much alcohol, smoking, and not eating enough potassium from fruits and vegetables. High blood pressure is a leading risk factor for heart attack, stroke, congestive heart failure, dementia, kidney failure, and premature death. Long-term effects of excessive salt intake include stiffening of the arteries and thickening of heart muscle and organ damage. Recommendations include ways to reduce hypertension and the risk of heart disease.  Diseases of Our Time - Focusing on Diabetes Clinical staff conducted group or individual video education with verbal and written material and guidebook.   Patient learns why the best way to stop diseases of our time is prevention, through food and other lifestyle changes. Medicine (such as prescription pills and surgeries) is often only a Band-Aid on the problem, not a long-term solution. Most common diseases of our time include obesity, type 2 diabetes, hypertension, heart disease, and cancer. The Pritikin Program is recommended and has been proven to help reduce, reverse, and/or prevent the damaging effects of metabolic syndrome.  Nutrition   Overview of the Pritikin Eating Plan  Clinical staff conducted group or individual video education with verbal and written material and guidebook.  Patient learns about the [place] for disease risk reduction. The [place] emphasizes a wide variety of unrefined, minimally-processed carbohydrates, like fruits, vegetables, whole grains, and legumes. Go, Caution, and Stop food choices are explained. Plant-based and lean animal proteins are emphasized. Rationale provided for low sodium intake for blood pressure control, low added sugars for blood sugar stabilization, and low added fats and oils for coronary artery disease risk reduction and weight management.  Calorie Density  Clinical staff conducted group or individual video education with verbal and written material and guidebook.  Patient learns about calorie density and how it impacts the Pritikin Eating Plan. Knowing the characteristics of the food you choose will help you decide whether those foods will lead to weight gain or weight loss, and whether you want to consume more or less of them. Weight loss is usually a side effect of the Pritikin Eating Plan because of its focus on low calorie-dense foods.  Label Reading  Clinical staff conducted group or individual video education with verbal and written material and guidebook.  Patient learns about the Pritikin recommended label reading guidelines and corresponding recommendations  regarding calorie density, added sugars, sodium content, and whole grains.  Dining Out - Part 1  Clinical staff conducted group or individual video education with verbal and written material and guidebook.  Patient learns that restaurant meals can be sabotaging because they can be so high in calories, fat, sodium, and/or sugar. Patient learns recommended strategies on how to positively address this and avoid unhealthy pitfalls.  Facts on Fats  Clinical staff conducted group or individual video education with verbal and written material and guidebook.  Patient learns that lifestyle modifications can be just as effective, if not more so, as many medications for lowering your  risk of heart disease. A Pritikin lifestyle can help to reduce your risk of inflammation and atherosclerosis (cholesterol build-up, or plaque, in the artery walls). Lifestyle interventions such as dietary choices and physical activity address the cause of atherosclerosis. A review of the types of fats and their impact on blood cholesterol levels, along with dietary recommendations to reduce fat intake is also included.  Nutrition Action Plan  Clinical staff conducted group or individual video education with verbal and written material and guidebook.  Patient learns how to incorporate Pritikin recommendations into their lifestyle. Recommendations include planning and keeping personal health goals in mind as an important part of their success.  Healthy Mind-Set    Healthy Minds, Bodies, Hearts  Clinical staff conducted group or individual video education with verbal and written material and guidebook.  Patient learns how to identify when they are stressed. Video will discuss the impact of that stress, as well as the many benefits of stress management. Patient will also be introduced to stress management techniques. The way we think, act, and feel has an impact on our hearts.  How Our Thoughts Can Heal Our Hearts  Clinical staff  conducted group or individual video education with verbal and written material and guidebook.  Patient learns that negative thoughts can cause depression and anxiety. This can result in negative lifestyle behavior and serious health problems. Cognitive behavioral therapy is an effective method to help control our thoughts in order to change and improve our emotional outlook.  Additional Videos:  Exercise    Improving Performance  Clinical staff conducted group or individual video education with verbal and written material and guidebook.  Patient learns to use a non-linear approach by alternating intensity levels and lengths of time spent exercising to help burn more calories and lose more body fat. Cardiovascular exercise helps improve heart health, metabolism, hormonal balance, blood sugar control, and recovery from fatigue. Resistance training improves strength, endurance, balance, coordination, reaction time, metabolism, and muscle mass. Flexibility exercise improves circulation, posture, and balance. Seek guidance from your physician and exercise physiologist before implementing an exercise routine and learn your capabilities and proper form for all exercise.  Introduction to Yoga  Clinical staff conducted group or individual video education with verbal and written material and guidebook.  Patient learns about yoga, a discipline of the coming together of mind, breath, and body. The benefits of yoga include improved flexibility, improved range of motion, better posture and core strength, increased lung function, weight loss, and positive self-image. Yoga's heart health benefits include lowered blood pressure, healthier heart rate, decreased cholesterol and triglyceride levels, improved immune function, and reduced stress. Seek guidance from your physician and exercise physiologist before implementing an exercise routine and learn your capabilities and proper form for all exercise.  Medical   Aging:  Enhancing Your Quality of Life  Clinical staff conducted group or individual video education with verbal and written material and guidebook.  Patient learns key strategies and recommendations to stay in good physical health and enhance quality of life, such as prevention strategies, having an advocate, securing a [place], and keeping a list of medications and system for tracking them. It also discusses how to avoid risk for bone loss.  Biology of Weight Control  Clinical staff conducted group or individual video education with verbal and written material and guidebook.  Patient learns that weight gain occurs because we consume more calories than we burn (eating more, moving less). Even if your body weight  is normal, you may have higher ratios of fat compared to muscle mass. Too much body fat puts you at increased risk for cardiovascular disease, heart attack, stroke, type 2 diabetes, and obesity-related cancers. In addition to exercise, following the [place] can help reduce your risk.  Decoding Lab Results  Clinical staff conducted group or individual video education with verbal and written material and guidebook.  Patient learns that lab test reflects one measurement whose values change over time and are influenced by many factors, including medication, stress, sleep, exercise, food, hydration, pre-existing medical conditions, and more. It is recommended to use the knowledge from this video to become more involved with your lab results and evaluate your numbers to speak with your doctor.   Diseases of Our Time - Overview  Clinical staff conducted group or individual video education with verbal and written material and guidebook.  Patient learns that according to the CDC, 50% to 70% of chronic diseases (such as obesity, type 2 diabetes, elevated lipids, hypertension, and heart disease) are avoidable through lifestyle improvements including healthier food  choices, listening to satiety cues, and increased physical activity.  Sleep Disorders Clinical staff conducted group or individual video education with verbal and written material and guidebook.  Patient learns how good quality and duration of sleep are important to overall health and well-being. Patient also learns about sleep disorders and how they impact health along with recommendations to address them, including discussing with a physician.  Nutrition  Dining Out - Part 2 Clinical staff conducted group or individual video education with verbal and written material and guidebook.  Patient learns how to plan ahead and communicate in order to maximize their dining experience in a healthy and nutritious manner. Included are recommended food choices based on the type of restaurant the patient is visiting.   Fueling a Best boy conducted group or individual video education with verbal and written material and guidebook.  There is a strong connection between our food choices and our health. Diseases like obesity and type 2 diabetes are very prevalent and are in large-part due to lifestyle choices. The Pritikin Eating Plan provides plenty of food and hunger-curbing satisfaction. It is easy to follow, affordable, and helps reduce health risks.  Menu Workshop  Clinical staff conducted group or individual video education with verbal and written material and guidebook.  Patient learns that restaurant meals can sabotage health goals because they are often packed with calories, fat, sodium, and sugar. Recommendations include strategies to plan ahead and to communicate with the manager, chef, or server to help order a healthier meal.  Planning Your Eating Strategy  Clinical staff conducted group or individual video education with verbal and written material and guidebook.  Patient learns about the [place] and its benefit of reducing the risk of disease. The [place] does not focus on calories. Instead, it emphasizes high-quality, nutrient-rich foods. By knowing the characteristics of the foods, we choose, we can determine their calorie density and make informed decisions.  Targeting Your Nutrition Priorities  Clinical staff conducted group or individual video education with verbal and written material and guidebook.  Patient learns that lifestyle habits have a tremendous impact on disease risk and progression. This video provides eating and physical activity recommendations based on your personal health goals, such as reducing LDL cholesterol, losing weight, preventing or controlling type 2 diabetes, and reducing high blood pressure.  Vitamins and Minerals  Clinical staff conducted group or individual  video education with verbal and written material and guidebook.  Patient learns different ways to obtain key vitamins and minerals, including through a recommended healthy diet. It is important to discuss all supplements you take with your doctor.   Healthy Mind-Set    Smoking Cessation  Clinical staff conducted group or individual video education with verbal and written material and guidebook.  Patient learns that cigarette smoking and tobacco addiction pose a serious health risk which affects millions of people. Stopping smoking will significantly reduce the risk of heart disease, lung disease, and many forms of cancer. Recommended strategies for quitting are covered, including working with your doctor to develop a successful plan.  Culinary   Becoming a Financial trader conducted group or individual video education with verbal and written material and guidebook.  Patient learns that cooking at home can be healthy, cost-effective, quick, and puts them in control. Keys to cooking healthy recipes will include looking at your recipe, assessing your equipment needs, planning ahead, making it simple, choosing cost-effective seasonal ingredients,  and limiting the use of added fats, salts, and sugars.  Cooking - Breakfast and Snacks  Clinical staff conducted group or individual video education with verbal and written material and guidebook.  Patient learns how important breakfast is to satiety and nutrition through the entire day. Recommendations include key foods to eat during breakfast to help stabilize blood sugar levels and to prevent overeating at meals later in the day. Planning ahead is also a key component.  Cooking - Human resources officer conducted group or individual video education with verbal and written material and guidebook.  Patient learns eating strategies to improve overall health, including an approach to cook more at home. Recommendations include thinking of animal protein as a side on your plate rather than center stage and focusing instead on lower calorie dense options like vegetables, fruits, whole grains, and plant-based proteins, such as beans. Making sauces in large quantities to freeze for later and leaving the skin on your vegetables are also recommended to maximize your experience.  Cooking - Healthy Salads and Dressing Clinical staff conducted group or individual video education with verbal and written material and guidebook.  Patient learns that vegetables, fruits, whole grains, and legumes are the foundations of the [place]. Recommendations include how to incorporate each of these in flavorful and healthy salads, and how to create homemade salad dressings. Proper handling of ingredients is also covered. Cooking - Soups and [employer] - Soups and Desserts Clinical staff conducted group or individual video education with verbal and written material and guidebook.  Patient learns that Pritikin soups and desserts make for easy, nutritious, and delicious snacks and meal components that are low in sodium, fat, sugar, and calorie density, while high in vitamins, minerals, and filling  fiber. Recommendations include simple and healthy ideas for soups and desserts.   Overview     The Pritikin Solution Program Overview Clinical staff conducted group or individual video education with verbal and written material and guidebook.  Patient learns that the results of the [place] Program have been documented in more than 100 articles published in peer-reviewed journals, and the benefits include reducing risk factors for (and, in some cases, even reversing) high cholesterol, high blood pressure, type 2 diabetes, obesity, and more! An overview of the three key pillars of the Pritikin Program will be covered: eating well, doing regular exercise, and having a healthy mind-set.  WORKSHOPS  Exercise: Exercise Basics:  Building Your Action Plan Clinical staff led group instruction and group discussion with PowerPoint presentation and patient guidebook. To enhance the learning environment the use of posters, models and videos may be added. At the conclusion of this workshop, patients will comprehend the difference between physical activity and exercise, as well as the benefits of incorporating both, into their routine. Patients will understand the FITT (Frequency, Intensity, Time, and Type) principle and how to use it to build an exercise action plan. In addition, safety concerns and other considerations for exercise and cardiac rehab will be addressed by the presenter. The purpose of this lesson is to promote a comprehensive and effective weekly exercise routine in order to improve patients' overall level of fitness.   Managing Heart Disease: Your Path to a Healthier Heart Clinical staff led group instruction and group discussion with PowerPoint presentation and patient guidebook. To enhance the learning environment the use of posters, models and videos may be added.At the conclusion of this workshop, patients will understand the anatomy and physiology of the heart. Additionally, they will  understand how Pritikin's three pillars impact the risk factors, the progression, and the management of heart disease.  The purpose of this lesson is to provide a high-level overview of the heart, heart disease, and how the Pritikin lifestyle positively impacts risk factors.  Exercise Biomechanics Clinical staff led group instruction and group discussion with PowerPoint presentation and patient guidebook. To enhance the learning environment the use of posters, models and videos may be added. Patients will learn how the structural parts of their bodies function and how these functions impact their daily activities, movement, and exercise. Patients will learn how to promote a neutral spine, learn how to manage pain, and identify ways to improve their physical movement in order to promote healthy living. The purpose of this lesson is to expose patients to common physical limitations that impact physical activity. Participants will learn practical ways to adapt and manage aches and pains, and to minimize their effect on regular exercise. Patients will learn how to maintain good posture while sitting, walking, and lifting.  Balance Training and Fall Prevention  Clinical staff led group instruction and group discussion with PowerPoint presentation and patient guidebook. To enhance the learning environment the use of posters, models and videos may be added. At the conclusion of this workshop, patients will understand the importance of their sensorimotor skills (vision, proprioception, and the vestibular system) in maintaining their ability to balance as they age. Patients will apply a variety of balancing exercises that are appropriate for their current level of function. Patients will understand the common causes for poor balance, possible solutions to these problems, and ways to modify their physical environment in order to minimize their fall risk. The purpose of this lesson is to teach patients  about the importance of maintaining balance as they age and ways to minimize their risk of falling.  WORKSHOPS   Nutrition:  Fueling a Scientist, research (physical sciences) led group instruction and group discussion with PowerPoint presentation and patient guidebook. To enhance the learning environment the use of posters, models and videos may be added. Patients will review the foundational principles of the [place] and understand what constitutes a serving size in each of the food groups. Patients will also learn Pritikin-friendly foods that are better choices when away from home and review make-ahead meal and snack options. Calorie density will be reviewed and applied to three nutrition priorities: weight maintenance, weight loss, and weight gain. The purpose  of this lesson is to reinforce (in a group setting) the key concepts around what patients are recommended to eat and how to apply these guidelines when away from home by planning and selecting Pritikin-friendly options. Patients will understand how calorie density may be adjusted for different weight management goals.  Mindful Eating  Clinical staff led group instruction and group discussion with PowerPoint presentation and patient guidebook. To enhance the learning environment the use of posters, models and videos may be added. Patients will briefly review the concepts of the [place] and the importance of low-calorie dense foods. The concept of mindful eating will be introduced as well as the importance of paying attention to internal hunger signals. Triggers for non-hunger eating and techniques for dealing with triggers will be explored. The purpose of this lesson is to provide patients with the opportunity to review the basic principles of the [place], discuss the value of eating mindfully and how to measure internal cues of hunger and fullness using the Hunger Scale. Patients will also discuss reasons for non-hunger  eating and learn strategies to use for controlling emotional eating.  Targeting Your Nutrition Priorities Clinical staff led group instruction and group discussion with PowerPoint presentation and patient guidebook. To enhance the learning environment the use of posters, models and videos may be added. Patients will learn how to determine their genetic susceptibility to disease by reviewing their family history. Patients will gain insight into the importance of diet as part of an overall healthy lifestyle in mitigating the impact of genetics and other environmental insults. The purpose of this lesson is to provide patients with the opportunity to assess their personal nutrition priorities by looking at their family history, their own health history and current risk factors. Patients will also be able to discuss ways of prioritizing and modifying the [place] for their highest risk areas  Menu  Clinical staff led group instruction and group discussion with PowerPoint presentation and patient guidebook. To enhance the learning environment the use of posters, models and videos may be added. Using menus brought in from [employer], or printed from [employer], patients will apply the [place] dining out guidelines that were presented in the [employer] video. Patients will also be able to practice these guidelines in a variety of provided scenarios. The purpose of this lesson is to provide patients with the opportunity to practice hands-on learning of the [place] with actual menus and practice scenarios.  Label Reading Clinical staff led group instruction and group discussion with PowerPoint presentation and patient guidebook. To enhance the learning environment the use of posters, models and videos may be added. Patients will review and discuss the [facility] label reading guidelines presented in [facility]'s Label Reading Educational series video.  Using fool labels brought in from local grocery stores and markets, patients will apply the label reading guidelines and determine if the packaged food meet the [facility] guidelines. The purpose of this lesson is to provide patients with the opportunity to review, discuss, and practice hands-on learning of the [facility] Label Reading guidelines with actual packaged food labels. [place] Workshops are designed to teach patients ways to prepare quick, simple, and affordable recipes at home. The importance of nutrition's role in chronic disease risk reduction is reflected in its emphasis in the overall [facility] program. By learning how to prepare essential core [facility] Eating Plan recipes, patients will increase control over what they eat; be able  to customize the flavor of foods without the use of added salt, sugar, or fat; and improve the quality of the food they consume. By learning a set of core recipes which are easily assembled, quickly prepared, and affordable, patients are more likely to prepare more healthy foods at home. These workshops focus on convenient breakfasts, simple entres, side dishes, and desserts which can be prepared with minimal effort and are consistent with nutrition recommendations for cardiovascular risk reduction. Cooking [employer] are taught by a Engineer, materials (RD) who has been trained by the [employer]. The chef or RD has a clear understanding of the importance of minimizing - if not completely eliminating - added fat, sugar, and sodium in recipes. Throughout the series of [place] Workshop sessions, patients will learn about healthy ingredients and efficient methods of cooking to build confidence in their capability to prepare    Cooking School weekly topics:  Adding Flavor- Sodium-Free  Fast and Healthy Breakfasts  Powerhouse Plant-Based Proteins  Satisfying Salads and Dressings  Simple Sides and  Sauces  International Cuisine-Spotlight on the [place] Zones  Delicious Desserts  Savory Soups  Efficiency Cooking - Meals in a Snap  Tasty Appetizers and Snacks  Comforting Weekend Breakfasts  One-Pot Wonders   Fast Evening Meals  Easy [place] (Psychosocial): New Thoughts, New Behaviors Clinical staff led group instruction and group discussion with PowerPoint presentation and patient guidebook. To enhance the learning environment the use of posters, models and videos may be added. Patients will learn and practice techniques for developing effective health and lifestyle goals. Patients will be able to effectively apply the goal setting process learned to develop at least one new personal goal.  The purpose of this lesson is to expose patients to a new skill set of behavior modification techniques such as techniques setting SMART goals, overcoming barriers, and achieving new thoughts and new behaviors.  Managing Moods and Relationships Clinical staff led group instruction and group discussion with PowerPoint presentation and patient guidebook. To enhance the learning environment the use of posters, models and videos may be added. Patients will learn how emotional and chronic stress factors can impact their health and relationships. They will learn healthy ways to manage their moods and utilize positive coping mechanisms. In addition, ICR patients will learn ways to improve communication skills. The purpose of this lesson is to expose patients to ways of understanding how one's mood and health are intimately connected. Developing a healthy outlook can help build positive relationships and connections with others. Patients will understand the importance of utilizing effective communication skills that include actively listening and being heard. They will learn and understand the importance of the "4 Cs" and especially Connections in  fostering of a Healthy Mind-Set.  Healthy Sleep for a Healthy Heart Clinical staff led group instruction and group discussion with PowerPoint presentation and patient guidebook. To enhance the learning environment the use of posters, models and videos may be added. At the conclusion of this workshop, patients will be able to demonstrate knowledge of the importance of sleep to overall health, well-being, and quality of life. They will understand the symptoms of, and treatments for, common sleep disorders. Patients will also be able to identify daytime and nighttime behaviors which impact sleep, and they will be able to apply these tools to help manage sleep-related challenges. The purpose of this lesson is to provide patients with a general overview of  sleep and outline the importance of quality sleep. Patients will learn about a few of the most common sleep disorders. Patients will also be introduced to the concept of "sleep hygiene," and discover ways to self-manage certain sleeping problems through simple daily behavior changes. Finally, the workshop will motivate patients by clarifying the links between quality sleep and their goals of heart-healthy living.   Recognizing and Reducing Stress Clinical staff led group instruction and group discussion with PowerPoint presentation and patient guidebook. To enhance the learning environment the use of posters, models and videos may be added. At the conclusion of this workshop, patients will be able to understand the types of stress reactions, differentiate between acute and chronic stress, and recognize the impact that chronic stress has on their health. They will also be able to apply different coping mechanisms, such as reframing negative self-talk. Patients will have the opportunity to practice a variety of stress management techniques, such as deep abdominal breathing, progressive muscle relaxation, and/or guided imagery.  The purpose of this lesson is to  educate patients on the role of stress in their lives and to provide healthy techniques for coping with it.  Learning Barriers/Preferences:  Learning Barriers/Preferences - 03/06/22 1348       Learning Barriers/Preferences   Learning Barriers Sight;Hearing   wears glasses and hearing aids   Learning Preferences Skilled Demonstration;Individual Instruction             Education Topics:  Knowledge Questionnaire Score:  Knowledge Questionnaire Score - 03/06/22 1211       Knowledge Questionnaire Score   Pre Score 17/24             Core Components/Risk Factors/Patient Goals at Admission:  Personal Goals and Risk Factors at Admission - 03/06/22 1211       Core Components/Risk Factors/Patient Goals on Admission   Hypertension Yes    Intervention Provide education on lifestyle modifcations including regular physical activity/exercise, weight management, moderate sodium restriction and increased consumption of fresh fruit, vegetables, and low fat dairy, alcohol moderation, and smoking cessation.;Monitor prescription use compliance.    Expected Outcomes Short Term: Continued assessment and intervention until BP is < 140/5m HG in hypertensive participants. < 130/829mHG in hypertensive participants with diabetes, heart failure or chronic kidney disease.;Long Term: Maintenance of blood pressure at goal levels.    Lipids Yes    Intervention Provide education and support for participant on nutrition & aerobic/resistive exercise along with prescribed medications to achieve LDL '70mg'$ , HDL >'40mg'$ .    Expected Outcomes Short Term: Participant states understanding of desired cholesterol values and is compliant with medications prescribed. Participant is following exercise prescription and nutrition guidelines.;Long Term: Cholesterol controlled with medications as prescribed, with individualized exercise RX and with personalized nutrition plan. Value goals: LDL < '70mg'$ , HDL > 40 mg.              Core Components/Risk Factors/Patient Goals Review:   Goals and Risk Factor Review     Row Name 03/14/22 1055 04/08/22 1530           Core Components/Risk Factors/Patient Goals Review   Personal Goals Review Weight Management/Obesity;Hypertension;Lipids Weight Management/Obesity;Hypertension;Lipids      Review RoLeotarted intensive cardiac rehab on 03/12/22. Bradyn did well with exercise, vital signs were stable RoLyontinues to do well with exercise at intensive cardiac rehab., vital signs have been  stable      Expected Outcomes [place] continue to participate in intensive cardiac rehab for exercise, nutrition and lifestyle  modifications [place] will continue to participate in intensive cardiac rehab for exercise, nutrition and lifestyle modifications               Core Components/Risk Factors/Patient Goals at Discharge (Final Review):   Goals and Risk Factor Review - 04/08/22 1530       Core Components/Risk Factors/Patient Goals Review   Personal Goals Review Weight Management/Obesity;Hypertension;Lipids    Review Braxxton continues to do well with exercise at intensive cardiac rehab., vital signs have been  stable    Expected Outcomes Kervin will continue to participate in intensive cardiac rehab for exercise, nutrition and lifestyle modifications             ITP Comments:  ITP Comments     Row Name 03/06/22 1407 03/11/22 1042 03/14/22 1050 04/08/22 1527     ITP Comments Dr. Loreta Ave MD, Medical Director. Introduction to [facility] Education Program/Intensive Cardiac Rehab. Initial orientation packet reviewed with the patient. 30 Day ITP Review. Khamari is scheduled to begin intensive cardiac rehab on 03/12/22. 30 Day ITP Review. Tymir started exercise at intensive cardiac rehab on 03/22/22 and did well with exercise 30 Day ITP Review. Khasir has good attendance and participation in intensive cardiac rehab.              Comments: See ITP Comments

## 2022-04-09 ENCOUNTER — Encounter (HOSPITAL_COMMUNITY)
Admission: RE | Admit: 2022-04-09 | Discharge: 2022-04-09 | Disposition: A | Discharge status: Home / Self Care | Payer: PPO | Source: Ambulatory Visit | Attending: Cardiology | Admitting: Cardiology

## 2022-04-09 DIAGNOSIS — Z48812 Encounter for surgical aftercare following surgery on the circulatory system: Secondary | ICD-10-CM | POA: Diagnosis not present

## 2022-04-09 DIAGNOSIS — Z955 Presence of coronary angioplasty implant and graft: Secondary | ICD-10-CM

## 2022-04-10 ENCOUNTER — Encounter (INDEPENDENT_AMBULATORY_CARE_PROVIDER_SITE_OTHER): Payer: PPO | Admitting: Ophthalmology

## 2022-04-10 DIAGNOSIS — H34832 Tributary (branch) retinal vein occlusion, left eye, with macular edema: Secondary | ICD-10-CM | POA: Diagnosis not present

## 2022-04-10 DIAGNOSIS — H43813 Vitreous degeneration, bilateral: Secondary | ICD-10-CM

## 2022-04-10 DIAGNOSIS — I10 Essential (primary) hypertension: Secondary | ICD-10-CM | POA: Diagnosis not present

## 2022-04-10 DIAGNOSIS — H35033 Hypertensive retinopathy, bilateral: Secondary | ICD-10-CM | POA: Diagnosis not present

## 2022-04-11 ENCOUNTER — Encounter (HOSPITAL_COMMUNITY)
Admission: RE | Admit: 2022-04-11 | Discharge: 2022-04-11 | Disposition: A | Discharge status: Home / Self Care | Payer: PPO | Source: Ambulatory Visit | Attending: Cardiology | Admitting: Cardiology

## 2022-04-11 DIAGNOSIS — R079 Chest pain, unspecified: Secondary | ICD-10-CM | POA: Insufficient documentation

## 2022-04-11 DIAGNOSIS — Z48812 Encounter for surgical aftercare following surgery on the circulatory system: Secondary | ICD-10-CM | POA: Diagnosis not present

## 2022-04-11 DIAGNOSIS — Z955 Presence of coronary angioplasty implant and graft: Secondary | ICD-10-CM | POA: Insufficient documentation

## 2022-04-14 ENCOUNTER — Encounter (HOSPITAL_COMMUNITY)
Admission: RE | Admit: 2022-04-14 | Discharge: 2022-04-14 | Disposition: A | Discharge status: Home / Self Care | Payer: PPO | Source: Ambulatory Visit | Attending: Cardiology | Admitting: Cardiology

## 2022-04-14 DIAGNOSIS — Z955 Presence of coronary angioplasty implant and graft: Secondary | ICD-10-CM | POA: Diagnosis not present

## 2022-04-15 DIAGNOSIS — R55 Syncope and collapse: Secondary | ICD-10-CM | POA: Diagnosis not present

## 2022-04-15 DIAGNOSIS — Z4509 Encounter for adjustment and management of other cardiac device: Secondary | ICD-10-CM | POA: Diagnosis not present

## 2022-04-15 DIAGNOSIS — Z95818 Presence of other cardiac implants and grafts: Secondary | ICD-10-CM | POA: Diagnosis not present

## 2022-04-16 ENCOUNTER — Encounter (HOSPITAL_COMMUNITY)
Admission: RE | Admit: 2022-04-16 | Discharge: 2022-04-16 | Disposition: A | Discharge status: Home / Self Care | Payer: PPO | Source: Ambulatory Visit | Attending: Cardiology | Admitting: Cardiology

## 2022-04-16 DIAGNOSIS — Z955 Presence of coronary angioplasty implant and graft: Secondary | ICD-10-CM | POA: Diagnosis not present

## 2022-04-17 ENCOUNTER — Other Ambulatory Visit: Payer: Self-pay | Admitting: Cardiology

## 2022-04-17 DIAGNOSIS — I25118 Atherosclerotic heart disease of native coronary artery with other forms of angina pectoris: Secondary | ICD-10-CM

## 2022-04-18 ENCOUNTER — Encounter (HOSPITAL_COMMUNITY)
Admission: RE | Admit: 2022-04-18 | Discharge: 2022-04-18 | Disposition: A | Discharge status: Home / Self Care | Payer: PPO | Source: Ambulatory Visit | Attending: Cardiology | Admitting: Cardiology

## 2022-04-18 DIAGNOSIS — Z955 Presence of coronary angioplasty implant and graft: Secondary | ICD-10-CM | POA: Diagnosis not present

## 2022-04-21 ENCOUNTER — Encounter (HOSPITAL_COMMUNITY)
Admission: RE | Admit: 2022-04-21 | Discharge: 2022-04-21 | Disposition: A | Discharge status: Home / Self Care | Payer: PPO | Source: Ambulatory Visit | Attending: Cardiology | Admitting: Cardiology

## 2022-04-21 DIAGNOSIS — Z955 Presence of coronary angioplasty implant and graft: Secondary | ICD-10-CM | POA: Diagnosis not present

## 2022-04-23 ENCOUNTER — Encounter (HOSPITAL_COMMUNITY)
Admission: RE | Admit: 2022-04-23 | Discharge: 2022-04-23 | Disposition: A | Discharge status: Home / Self Care | Payer: PPO | Source: Ambulatory Visit | Attending: Cardiology | Admitting: Cardiology

## 2022-04-23 DIAGNOSIS — Z955 Presence of coronary angioplasty implant and graft: Secondary | ICD-10-CM | POA: Diagnosis not present

## 2022-04-25 ENCOUNTER — Telehealth (HOSPITAL_COMMUNITY): Payer: Self-pay | Admitting: *Deleted

## 2022-04-25 ENCOUNTER — Encounter (HOSPITAL_COMMUNITY)
Admission: RE | Admit: 2022-04-25 | Discharge: 2022-04-25 | Disposition: A | Discharge status: Home / Self Care | Payer: PPO | Source: Ambulatory Visit | Attending: Cardiology | Admitting: Cardiology

## 2022-04-25 DIAGNOSIS — Z955 Presence of coronary angioplasty implant and graft: Secondary | ICD-10-CM

## 2022-04-25 NOTE — Telephone Encounter (Signed)
-----   Message from [name], MD sent at 04/25/2022  3:49 PM EST ----- Regarding: Dizziness on bending forward. This may be a normal response. Please check orthostatics when he has those symptoms  JG ----- Message ----- From: Magda Kiel, RN Sent: 04/25/2022  11:59 AM EST To: [name], MD  Good afternoon Dr Einar Gip,  Mr Brincefield reports feeling lightheaded when he bends over please see attached vital signs.  He is taking Toprol losartan and hydralazine as needed prn.  Thanks for your input!  [facility]  Cardiac Rehab

## 2022-04-25 NOTE — Progress Notes (Signed)
[name] reports that he feels lightheaded when he bends over. Post exercise sitting BP 98/60 sitting. Standing blood pressure 98/70. Patient given water.  Will send today's vital signs to Dr Einar Gip for review. No symptoms or complaints upon exit from intensive cardiac rehab. Harrell Gave RN BSN

## 2022-04-28 ENCOUNTER — Encounter (HOSPITAL_COMMUNITY)
Admission: RE | Admit: 2022-04-28 | Discharge: 2022-04-28 | Disposition: A | Discharge status: Home / Self Care | Payer: PPO | Source: Ambulatory Visit | Attending: Cardiology | Admitting: Cardiology

## 2022-04-28 DIAGNOSIS — M160 Bilateral primary osteoarthritis of hip: Secondary | ICD-10-CM | POA: Diagnosis not present

## 2022-04-28 DIAGNOSIS — Z955 Presence of coronary angioplasty implant and graft: Secondary | ICD-10-CM | POA: Diagnosis not present

## 2022-04-30 ENCOUNTER — Telehealth: Payer: Self-pay | Admitting: Cardiology

## 2022-04-30 ENCOUNTER — Encounter (HOSPITAL_COMMUNITY)
Admission: RE | Admit: 2022-04-30 | Discharge: 2022-04-30 | Disposition: A | Discharge status: Home / Self Care | Payer: PPO | Source: Ambulatory Visit | Attending: Cardiology | Admitting: Cardiology

## 2022-04-30 DIAGNOSIS — Z955 Presence of coronary angioplasty implant and graft: Secondary | ICD-10-CM | POA: Diagnosis not present

## 2022-04-30 DIAGNOSIS — R079 Chest pain, unspecified: Secondary | ICD-10-CM | POA: Diagnosis not present

## 2022-04-30 DIAGNOSIS — Z48812 Encounter for surgical aftercare following surgery on the circulatory system: Secondary | ICD-10-CM | POA: Diagnosis not present

## 2022-04-30 DIAGNOSIS — I25118 Atherosclerotic heart disease of native coronary artery with other forms of angina pectoris: Secondary | ICD-10-CM

## 2022-04-30 MED ORDER — NITROGLYCERIN 0.4 MG SL SUBL: 0.4000 mg | SUBLINGUAL_TABLET | SUBLINGUAL | Status: DC | PRN

## 2022-04-30 NOTE — Telephone Encounter (Signed)
ICD-10-CM   1. Coronary artery disease of native artery of native heart with stable angina pectoris ([place])  I25.118        EKG 04/30/2022: Normal sinus rhythm with rate of 61 bpm, left axis deviation, left anterior fascicular block.  LVH.  Compared to 12/25/2021, no significant change.  Please disregard telephone encounter EKG that stated atrial fibrillation as it is an error.

## 2022-04-30 NOTE — Progress Notes (Addendum)
Patient reported having chest pain on the nustep. [name] rated his pain a 5/10 . Exercise stopped. Blood pressure 142/70. Oxygen saturation 100% on room air. 12 lead obtained. Patient placed on  zoll. Telemetry rhythm Sinus. Patient placed on 4l/min of oxygen. Discussed with Dr Einar Gip

## 2022-04-30 NOTE — Progress Notes (Signed)
EKG 04/30/2022: Normal sinus rhythm with rate of 61 bpm, left axis deviation, left anterior fascicular block.  LVH.  Compared to 12/25/2021, no significant change.

## 2022-04-30 NOTE — Telephone Encounter (Signed)
FROM [place]

## 2022-04-30 NOTE — Telephone Encounter (Addendum)
ICD-10-CM   1. Coronary artery disease of native artery of native heart with stable angina pectoris ([place])  I25.118        EKG 04/30/2022: Normal sinus rhythm with rate of 61 bpm, left axis deviation, left anterior fascicular block. LVH. Compared to 12/25/2021, no significant change.

## 2022-05-02 ENCOUNTER — Encounter (HOSPITAL_COMMUNITY)
Admission: RE | Admit: 2022-05-02 | Discharge: 2022-05-02 | Disposition: A | Discharge status: Home / Self Care | Payer: PPO | Source: Ambulatory Visit | Attending: Cardiology | Admitting: Cardiology

## 2022-05-02 VITALS — BP 136/66 | HR 62 | Ht 66.0 in | Wt 185.0 lb

## 2022-05-02 DIAGNOSIS — Z955 Presence of coronary angioplasty implant and graft: Secondary | ICD-10-CM | POA: Diagnosis not present

## 2022-05-02 DIAGNOSIS — R079 Chest pain, unspecified: Secondary | ICD-10-CM

## 2022-05-02 NOTE — Progress Notes (Signed)
Discharge Progress Report  Patient Details  Name: [name] MRN: [id number] Date of Birth: 09/26/1945 Referring Provider:   Flowsheet Row INTENSIVE CARDIAC REHAB ORIENT from 03/06/2022 in [facility]  Referring Provider [name], MD        Number of Visits: 56  Reason for Discharge:  Patient reached a stable level of exercise. Patient independent in their exercise. Patient has met program and personal goals.  Smoking History:  Social History   Tobacco Use  Smoking Status Never  Smokeless Tobacco Never    Diagnosis:  12/24/2021 DES x 2 Cx/LM  Chest pain, unspecified type  ADL UCSD:   Initial Exercise Prescription:  Initial Exercise Prescription - 03/06/22 1300       Date of Initial Exercise RX and Referring Provider   Date 03/06/22    Referring Provider [name], MD    Expected Discharge Date 05/02/22      NuStep   Level 1    SPM 80    Minutes 15    METs 2.6      Prescription Details   Frequency (times per week) 3    Duration Progress to 30 minutes of continuous aerobic without signs/symptoms of physical distress      Intensity   THRR 40-80% of Max Heartrate 57-114    Ratings of Perceived Exertion 11-13    Perceived Dyspnea 0-4      Progression   Progression Continue progressive overload as per policy without signs/symptoms or physical distress.      Resistance Training   Training Prescription Yes    Weight 2 lbs    Reps 10-15             Discharge Exercise Prescription (Final Exercise Prescription Changes):  Exercise Prescription Changes - 05/02/22 1033       Response to Exercise   Blood Pressure (Admit) 136/66    Blood Pressure (Exercise) 134/56    Blood Pressure (Exit) 136/62    Heart Rate (Admit) 62 bpm    Heart Rate (Exercise) 98 bpm    Heart Rate (Exit) 63 bpm    Rating of Perceived Exertion (Exercise) 12    Symptoms None    Duration Progress to 30 minutes of   aerobic without signs/symptoms of physical distress    Intensity THRR unchanged      Progression   Progression Continue to progress workloads to maintain intensity without signs/symptoms of physical distress.    Average METs 2.5      Resistance Training   Training Prescription Yes    Weight 2 lbs    Reps 10-15    Time 10 Minutes      Interval Training   Interval Training No      NuStep   Level 2    SPM 104    Minutes 30    METs 2.5      Home Exercise Plan   Plans to continue exercise at Home (comment)   Walking   Frequency Add 3 additional days to program exercise sessions.    Initial Home Exercises Provided 03/21/22             Functional Capacity:  6 Minute Walk     Row Name 03/06/22 1128 04/30/22 1037       6 Minute Walk   Phase Initial Discharge    Distance 1542 feet  Nustep used /chronic low back and hip pain 2100 feet  Nustep used /chronic  low back and hip pain. 0.64km, 2100 ft.    Distance % Change -- 36.19 %    Walk Time 6 minutes  .93 km (1542 ft) - total distance from nustep 6 minutes    # of Rest Breaks 0 0    MPH 2.92 3.98    METS 21 3.75    RPE 11 15    Perceived Dyspnea  1 1    VO2 Peak 8.95 13.14    Symptoms Yes (comment) Yes (comment)    Comments Headache, chronic low back pain/bilateral hip pain 3-4/10 Patient c/o bilateral calf tightness, mild SOB, RPD=1, and chest tightness 7/10 on pain scale.    Resting HR 71 bpm 65 bpm    Resting BP 110/60 126/50    Resting Oxygen Saturation  99 % --    Exercise Oxygen Saturation  during 6 min walk 96 % 100 %    Max Ex. HR 74 bpm 87 bpm    Max Ex. BP 136/60 150/62    2 Minute Post BP 112/66 132/78             Psychological, QOL, Others - Outcomes: PHQ 2/9:    05/02/2022   11:02 AM 03/06/2022    1:53 PM  Depression screen PHQ 2/9  Decreased Interest 1 2  Down, Depressed, Hopeless 0 0  PHQ - 2 Score 1 2  Altered sleeping 0 0  Tired, decreased energy 0 1  Change in appetite 0 1  Feeling  bad or failure about yourself  0 0  Trouble concentrating 0 0  Moving slowly or fidgety/restless 0 1  Suicidal thoughts 0 0  PHQ-9 Score 1 5  Difficult doing work/chores Somewhat difficult Not difficult at all    Quality of Life:  Quality of Life - 04/28/22 1720       Quality of Life   Select Quality of Life      Quality of Life Scores   Health/Function Pre 21.3 %    Health/Function Post 21.4 %    Health/Function % Change 0.47 %    Socioeconomic Pre 28.33 %    Socioeconomic Post 24.75 %    Socioeconomic % Change  -12.64 %    Psych/Spiritual Pre 28.21 %    Psych/Spiritual Post 29.29 %    Psych/Spiritual % Change 3.83 %    Family Pre 26.4 %    Family Post 25.5 %    Family % Change -3.41 %    GLOBAL Pre 24.82 %    GLOBAL Post 24.27 %    GLOBAL % Change -2.22 %             Personal Goals: Goals established at orientation with interventions provided to work toward goal.  Personal Goals and Risk Factors at Admission - 03/06/22 1211       Core Components/Risk Factors/Patient Goals on Admission   Hypertension Yes    Intervention Provide education on lifestyle modifcations including regular physical activity/exercise, weight management, moderate sodium restriction and increased consumption of fresh fruit, vegetables, and low fat dairy, alcohol moderation, and smoking cessation.;Monitor prescription use compliance.    Expected Outcomes Short Term: Continued assessment and intervention until BP is < 140/28m HG in hypertensive participants. < 130/846mHG in hypertensive participants with diabetes, heart failure or chronic kidney disease.;Long Term: Maintenance of blood pressure at goal levels.    Lipids Yes    Intervention Provide education and support for participant on nutrition & aerobic/resistive exercise along with prescribed medications to  achieve LDL '70mg'$ , HDL >'40mg'$ .    Expected Outcomes Short Term: Participant states understanding of desired cholesterol values and is  compliant with medications prescribed. Participant is following exercise prescription and nutrition guidelines.;Long Term: Cholesterol controlled with medications as prescribed, with individualized exercise RX and with personalized nutrition plan. Value goals: LDL < '70mg'$ , HDL > 40 mg.              Personal Goals Discharge:  Goals and Risk Factor Review     Row Name 03/14/22 1055 04/08/22 1530 05/07/22 0846         Core Components/Risk Factors/Patient Goals Review   Personal Goals Review Weight Management/Obesity;Hypertension;Lipids Weight Management/Obesity;Hypertension;Lipids Weight Management/Obesity;Hypertension;Lipids     Review [place] started intensive cardiac rehab on 03/12/22. Peniel did well with exercise, vital signs were stable Carla continues to do well with exercise at intensive cardiac rehab., vital signs have been  stable Nicole did  well with exercise at intensive cardiac rehab., vital signs were  stable. Young completed intensive cardiac rehab on 05/02/22     Expected Outcomes Shaunak will continue to participate in intensive cardiac rehab for exercise, nutrition and lifestyle modifications Kahlee will continue to participate in intensive cardiac rehab for exercise, nutrition and lifestyle modifications Mikale will continue to  exercise,  follow nutrition and lifestyle modifications upon completion of intensive cardiac rehab              Exercise Goals and Review:  Exercise Goals     Row Name 03/06/22 1226             Exercise Goals   Increase Physical Activity Yes       Intervention Provide advice, education, support and counseling about physical activity/exercise needs.;Develop an individualized exercise prescription for aerobic and resistive training based on initial evaluation findings, risk stratification, comorbidities and participant's personal goals.       Expected Outcomes Short Term: Attend rehab on a regular basis to increase amount of physical activity.;Long  Term: Add in home exercise to make exercise part of routine and to increase amount of physical activity.;Long Term: Exercising regularly at least 3-5 days a week.       Increase Strength and Stamina Yes       Intervention Provide advice, education, support and counseling about physical activity/exercise needs.;Develop an individualized exercise prescription for aerobic and resistive training based on initial evaluation findings, risk stratification, comorbidities and participant's personal goals.       Expected Outcomes Short Term: Increase workloads from initial exercise prescription for resistance, speed, and METs.;Long Term: Improve cardiorespiratory fitness, muscular endurance and strength as measured by increased METs and functional capacity (6MWT);Short Term: Perform resistance training exercises routinely during rehab and add in resistance training at home       Able to understand and use rate of perceived exertion (RPE) scale Yes       Intervention Provide education and explanation on how to use RPE scale       Expected Outcomes Short Term: Able to use RPE daily in rehab to express subjective intensity level;Long Term:  Able to use RPE to guide intensity level when exercising independently       Knowledge and understanding of Target Heart Rate Range (THRR) Yes       Intervention Provide education and explanation of THRR including how the numbers were predicted and where they are located for reference       Expected Outcomes Short Term: Able to state/look up THRR;Short Term: Able to use  daily as guideline for intensity in rehab;Long Term: Able to use THRR to govern intensity when exercising independently       Understanding of Exercise Prescription Yes       Intervention Provide education, explanation, and written materials on patient's individual exercise prescription       Expected Outcomes Short Term: Able to explain program exercise prescription;Long Term: Able to explain home exercise  prescription to exercise independently                Exercise Goals Re-Evaluation:  Exercise Goals Re-Evaluation     Row Name 03/12/22 1118 03/21/22 1050 04/21/22 1136 05/02/22 1131       Exercise Goal Re-Evaluation   Exercise Goals Review Increase Physical Activity;Able to understand and use rate of perceived exertion (RPE) scale;Increase Strength and Stamina Increase Physical Activity;Able to understand and use rate of perceived exertion (RPE) scale;Increase Strength and Stamina;Understanding of Exercise Prescription;Knowledge and understanding of Target Heart Rate Range (THRR);Able to check pulse independently Increase Physical Activity;Able to understand and use rate of perceived exertion (RPE) scale;Increase Strength and Stamina;Understanding of Exercise Prescription;Knowledge and understanding of Target Heart Rate Range (THRR);Able to check pulse independently Increase Physical Activity;Able to understand and use rate of perceived exertion (RPE) scale;Increase Strength and Stamina;Understanding of Exercise Prescription;Knowledge and understanding of Target Heart Rate Range (THRR);Able to check pulse independently    Comments Patient able to understand and use RPE scale appropriately. Reviewed exercise prescription with patient. Patient is walking 15-20 minutes, 2-3 days/week. Patient has a pulse oximetere to check his pulse. Patient is making gradual progress with exercise. Home walking limited by bilateral hip pain. Patient completed the cardiac rehab program and made gradual progress achieving 2.5 METs with exercise. Patient plans to continue walking at home as tolerated.    Expected Outcomes Progress workloads as tolerated to help increase strength and stamina. Patient will continue walking at home in addition to exercise at cardiac rehab. Add walking at home as tolerated. Patient will accumulate 30 minutes walking 3-4 days/week to achieve health and fitness goals.              Nutrition & Weight - Outcomes:  Pre Biometrics - 03/06/22 1045       Pre Biometrics   Waist Circumference 43 inches    Hip Circumference 41.5 inches    Waist to Hip Ratio 1.04 %    Triceps Skinfold 13 mm    % Body Fat 29.8 %    Grip Strength 29 kg    Flexibility --   Not done due to back problems/chronic back pain   Single Leg Stand --   not performed. Pt with cane, chronic back/hip pain            Post Biometrics - 05/02/22 1053        Post  Biometrics   Height '5\' 6"'$  (1.676 m)    Waist Circumference 43.5 inches    Hip Circumference 40.5 inches    Waist to Hip Ratio 1.07 %    BMI (Calculated) 29.87    Triceps Skinfold 11 mm    % Body Fat 29.3 %    Grip Strength 32 kg    Flexibility --   Not done due to back problems/chronic back pain   Single Leg Stand --   not performed. Pt with cane, chronic back/hip pain            Nutrition:  Nutrition Therapy & Goals - 04/11/22 1129  Nutrition Therapy   Diet Heart Healthy Diet    Drug/Food Interactions Statins/Certain Fruits      Personal Nutrition Goals   Nutrition Goal Patient to identify strategies for reducing cardiovascular risk by attending the weekly [employer] education classes    Personal Goal #2 Patient to improve diet quality by using the plate method as a daily guide for meal planning to include lean protein/plant protein, fruits, vegetables, whole grains, and nonfat diary as part of balanced diet    Personal Goal #3 Patient to reduce sodium intake to '1500mg'$  per day.    Comments Goals in progress. Parris continues to attend the [employer] series regularly. He continues to choose vegetables regularly and is mindful of saturated fat. His wife does the majority of the grocery shopping and cooking. Ahman will continue to benefit from adherance to the [employer] eating plan longterm and participation in intensive cardiac rehab for nutrition, exercise, and lifestyle modification.      Intervention Plan    Intervention Prescribe, educate and counsel regarding individualized specific dietary modifications aiming towards targeted core components such as weight, hypertension, lipid management, diabetes, heart failure and other comorbidities.;Nutrition handout(s) given to patient.    Expected Outcomes Short Term Goal: Understand basic principles of dietary content, such as calories, fat, sodium, cholesterol and nutrients.;Long Term Goal: Adherence to prescribed nutrition plan.             Nutrition Discharge:  Nutrition Assessments - 04/29/22 1154       Rate Your Plate Scores   Post Score 67             Education Questionnaire Score:  Knowledge Questionnaire Score - 04/28/22 1717       Knowledge Questionnaire Score   Pre Score 17/24    Post Score 17/24             Goals reviewed with patient; copy given to patient. Armari  graduates from  Intensive cardiac rehab program on 05/02/22  with completion of  24 exercise and  24 education sessions. Pt maintained good attendance and progressed nicely during their participation in rehab as evidenced by increased MET level.   Medication list reconciled. Repeat  PHQ score- 1 .  Pt has made significant lifestyle changes and should be commended for their success. Yeiden  achieved their goals during cardiac rehab. Rahmel increased his distance on his post exercise nustep test.   Pt plans to continue exercise at  the gym at his church and walking as tolerated. We are proud of Kanishk's progress! Harrell Gave RN BSN

## 2022-05-06 ENCOUNTER — Other Ambulatory Visit: Payer: Self-pay | Admitting: Cardiology

## 2022-05-06 DIAGNOSIS — E782 Mixed hyperlipidemia: Secondary | ICD-10-CM

## 2022-05-08 ENCOUNTER — Encounter (INDEPENDENT_AMBULATORY_CARE_PROVIDER_SITE_OTHER): Payer: PPO | Admitting: Ophthalmology

## 2022-05-08 DIAGNOSIS — I10 Essential (primary) hypertension: Secondary | ICD-10-CM

## 2022-05-08 DIAGNOSIS — H43813 Vitreous degeneration, bilateral: Secondary | ICD-10-CM

## 2022-05-08 DIAGNOSIS — H34832 Tributary (branch) retinal vein occlusion, left eye, with macular edema: Secondary | ICD-10-CM

## 2022-05-08 DIAGNOSIS — H35033 Hypertensive retinopathy, bilateral: Secondary | ICD-10-CM

## 2022-05-14 DIAGNOSIS — L570 Actinic keratosis: Secondary | ICD-10-CM | POA: Diagnosis not present

## 2022-05-14 DIAGNOSIS — L821 Other seborrheic keratosis: Secondary | ICD-10-CM | POA: Diagnosis not present

## 2022-05-14 DIAGNOSIS — B351 Tinea unguium: Secondary | ICD-10-CM | POA: Diagnosis not present

## 2022-05-14 DIAGNOSIS — Z85828 Personal history of other malignant neoplasm of skin: Secondary | ICD-10-CM | POA: Diagnosis not present

## 2022-05-15 ENCOUNTER — Ambulatory Visit: Payer: PPO | Admitting: Cardiology

## 2022-05-15 ENCOUNTER — Encounter: Payer: Self-pay | Admitting: Cardiology

## 2022-05-15 VITALS — BP 130/74 | HR 60 | Ht 66.0 in | Wt 181.4 lb

## 2022-05-15 DIAGNOSIS — Z95818 Presence of other cardiac implants and grafts: Secondary | ICD-10-CM | POA: Diagnosis not present

## 2022-05-15 DIAGNOSIS — I25118 Atherosclerotic heart disease of native coronary artery with other forms of angina pectoris: Secondary | ICD-10-CM | POA: Diagnosis not present

## 2022-05-15 DIAGNOSIS — R55 Syncope and collapse: Secondary | ICD-10-CM

## 2022-05-15 DIAGNOSIS — I10 Essential (primary) hypertension: Secondary | ICD-10-CM

## 2022-05-15 MED ORDER — HYDRALAZINE HCL 25 MG PO TABS: 25.0000 mg | ORAL_TABLET | Freq: Three times a day (TID) | ORAL | 3 refills | Status: DC | PRN

## 2022-05-15 NOTE — Progress Notes (Signed)
Primary Physician/Referring:  Haywood Pao, MD  Patient ID: [name], male    DOB: 12-06-1945, 77 y.o.   MRN: [id number]  Chief Complaint  Patient presents with   Coronary artery disease of native artery of native heart wi   Follow-up    Chest pain    HPI:    [name]  is a 77 y.o.  Caucasian male patient with coronary artery SP CABG in 1996, coronary stent to ostial free radial Y-graft to D1-OM1, chronic stable angina, mild aortic stenosis, hypertension, mixed hyperlipidemia, GERD, chronic back pain, recurrent syncope and dizziness, underwent loop recorder implantation on 04/09/2021.  Due to chest pain and worsening dyspnea, underwent cardiac catheterization and angioplasty to distal LM and ostial and proximal CX on 12/24/2021, has a patent LIMA to LAD and free radial graft with the ostial stent to D1.  He is accompanied by his wife, since angiography and angioplasty to his circumflex/left main, he has noticed marked improvement in dyspnea and also anginal symptoms but occasionally continues to have episodes of angina.  He is about to graduate from cardiac rehab.  1 episode of chest pain while in the cardiac rehab, EKG revealed sinus rhythm.  He continues to have occasional episodes of dizziness.  He has not had any syncope.  Past Medical History:  Diagnosis Date   Anginal pain ([place])    Arthritis    Basal cell carcinoma    back of neck   Blood transfusion without reported diagnosis 2006   during open heart surgery   CAD (coronary artery disease)    Diverticulosis 2013   Noted on Colonoscopy   Dyspnea    with activity and walking, sometime has SOB walking to mailbox and back to the house   GERD (gastroesophageal reflux disease)    History of colon polyps 2008   Noted on Colonoscopy   History of esophageal stricture 2003   Noted on EGD   Hyperlipidemia    Hypertension    IVCD (intraventricular conduction defect) 05/2018   Noted on EKG   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 Negative for leg swelling and syncope.  Neurological:  Positive for dizziness.  SP remote CABG in 1996, Objective  Blood pressure 130/74, pulse 60, height '5\' 6"'$  (1.676 m), weight 181 lb 6.4 oz (82.3 kg), SpO2 96 %.     05/15/2022   12:07 PM 05/02/2022   10:53 AM 03/18/2022    3:50 PM  Vitals with BMI  Height '5\' 6"'$  '5\' 6"'$  '5\' 6"'$   Weight 181 lbs 6 oz 184 lbs 15 oz 187 lbs  BMI 29.29 [id number]  Systolic [id number]  Diastolic 74 66 68  Pulse 60 62 78    Orthostatic VS  for the past 72 hrs (Last 3 readings):  Patient Position BP Location Cuff Size  05/15/22 1207 Sitting Left Arm Large     Physical Exam Neck:     Vascular: No carotid bruit or JVD.  Cardiovascular:     Rate and Rhythm: Normal rate and regular rhythm.     Pulses: Intact distal pulses.     Heart sounds: Murmur heard.     Midsystolic murmur is present with a grade of 2/6 at the upper right sternal border.     No gallop.  Pulmonary:     Effort: Pulmonary effort is normal.     Breath sounds: Normal breath sounds.  Abdominal:     General: Bowel sounds are normal.     Palpations: Abdomen is soft.  Musculoskeletal:     Right lower leg: No edema.     Left lower leg: No edema.  Skin:    Capillary Refill: Capillary refill takes less than 2 seconds.    Laboratory examination:   Recent Labs     09/11/21 1510 09/11/21 1520 12/19/21 1124 12/25/21 0214  NA 135 135 134 134*  K 3.8 3.9 4.5 4.4  CL 100 99 97 103  CO2 25  --  24 26  GLUCOSE 159* 161* 91 117*  BUN 25* 27* 22 17  CREATININE 1.42* 1.40* 1.30* 1.48*  CALCIUM 9.4  --  9.5 9.2  GFRNONAA 51*  --   --  48*       Latest Ref Rng & Units 12/25/2021    2:14 AM 12/19/2021   11:24 AM 09/11/2021    3:20 PM  CMP  Glucose 70 - 99 mg/dL 117  91  161   BUN 8 - 23 mg/dL '17  22  27   '$ Creatinine 0.61 - 1.24 mg/dL 1.48  1.30  1.40   Sodium 135 - 145 mmol/L 134  134  135   Potassium 3.5 - 5.1 mmol/L 4.4  4.5  3.9   Chloride 98 - 111 mmol/L 103  97  99   CO2 22 - 32 mmol/L 26  24    Calcium 8.9 - 10.3 mg/dL 9.2  9.5        Latest Ref Rng & Units 12/25/2021    2:14 AM 12/19/2021   11:24 AM 09/11/2021    3:20 PM  CBC  WBC 4.0 - 10.5 K/uL 7.5  6.7    Hemoglobin 13.0 - 17.0 g/dL 10.8  12.8  11.9   Hematocrit 39.0 - 52.0 % 30.8  37.9  35.0   Platelets 150 - 400 K/uL 204  249     External labs:   Labs 05/01/2020:  Serum glucose 102 mg, BUN 21, creatinine 1.0, EGFR 72 mL, potassium 4.4.  CMP normal.  WBC 6.89, Hb 14.0/HCT 40.7, platelets 214.  Normal indicis.  Total cholesterol 102, triglycerides 105, HDL 39, LDL 52.  Non-HDL cholesterol 83.  Testosterone levels normal.  PSA normal.  Medications and allergies   Allergies  Allergen Reactions   Codeine Nausea And Vomiting   Guaifenesin & Derivatives Nausea And Vomiting   Terbinafine And Related Other (See Comments)    Muscle pain   Iodinated Contrast Media Itching   Penicillins Rash    Tolerates Cefazolin without problems...[employer]    Current Outpatient Medications:    acetaminophen (TYLENOL) 500 MG tablet, Take 1,000 mg by mouth every morning. Additional 1000 mg if needed during the day, Disp: , Rfl:    aspirin EC 81 MG tablet,  Take 81 mg by mouth every evening. Swallow whole., Disp: , Rfl:    Calcium Carbonate Antacid (TUMS ULTRA PO), Take 2 tablets by mouth daily as  needed (acid reflux/indigestion.)., Disp: , Rfl:    clopidogrel (PLAVIX) 75 MG tablet, TAKE 1 TABLET BY MOUTH DAILY WITH BREAKFAST., Disp: 90 tablet, Rfl: 1   Coenzyme Q10 (COQ10) 100 MG CAPS, Take 100 mg by mouth 2 (two) times daily., Disp: , Rfl:    Cyanocobalamin (VITAMIN B-12) 5000 MCG TBDP, Take 5,000 mcg by mouth every evening., Disp: , Rfl:    ezetimibe (ZETIA) 10 MG tablet, TAKE 1/2 TABLET BY MOUTH EVERY EVENING, Disp: 45 tablet, Rfl: 11   fluconazole (DIFLUCAN) 200 MG tablet, Take 200 mg by mouth once a week., Disp: , Rfl:    losartan (COZAAR) 25 MG tablet, TAKE 1/2 TABLET BY MOUTH EVERY EVENING, Disp: 90 tablet, Rfl: 3   metoprolol succinate (TOPROL-XL) 25 MG 24 hr tablet, Take 0.5 tablets (12.5 mg total) by mouth every morning., Disp: 90 tablet, Rfl: 3   Multiple Vitamin (MULTIVITAMIN) tablet, Take 1 tablet by mouth in the morning. Seniors, Disp: , Rfl:    Multiple Vitamins-Minerals (PRESERVISION AREDS 2) CAPS, Take 1 capsule by mouth 2 (two) times daily., Disp: , Rfl:    nitroGLYCERIN (NITROSTAT) 0.4 MG SL tablet, TAKE 1 TABLET EVERY 5 MINUTES AS NEEDED FOR CHEST PAIN., Disp: 75 tablet, Rfl: 1   nortriptyline (PAMELOR) 25 MG capsule, TAKE 1 CAPSULE BY MOUTH AT BEDTIME. (Patient taking differently: Take 25 mg by mouth at bedtime as needed (Headache).), Disp: 90 capsule, Rfl: 1   pantoprazole (PROTONIX) 40 MG tablet, Take 1 tablet (40 mg total) by mouth daily., Disp: 30 tablet, Rfl: 1   Polyethyl Glycol-Propyl Glycol (SYSTANE) 0.4-0.3 % GEL ophthalmic gel, Place 1 application into both eyes at bedtime., Disp: , Rfl:    Propylene Glycol (SYSTANE BALANCE) 0.6 % SOLN, Place 1 drop into both eyes 3 (three) times daily as needed (dry/irritated eyes.)., Disp: , Rfl:    pyridoxine (B-6) 100 MG tablet, Take 100 mg by mouth every evening., Disp: , Rfl:    rosuvastatin (CRESTOR) 20 MG tablet, TAKE ONE TABLET BY MOUTH EVERYDAY AT BEDTIME, Disp: 90 tablet, Rfl: 2   vitamin C (ASCORBIC ACID) 500 MG  tablet, Take 500 mg by mouth daily., Disp: , Rfl:    hydrALAZINE (APRESOLINE) 25 MG tablet, Take 1 tablet (25 mg total) by mouth 3 (three) times daily as needed (For SBP >140 mm Hg)., Disp: 270 tablet, Rfl: 3    Radiology:   No results found.  Cardiac Studies:    SP CABG in 1996 with LIMA to LAD, free RIMA to D1 + OM1 and SVG to PDA.  Cardiac cath 06/2008 (CABG 1996.): LIMA to LAD, Skip Free radial graft to OM1 + D1 with ostial 4x15 non DES, has residual stenosis in the OM 1 limb of the skip graft. SVG to PD, Xience Stent to RCA 3x18, 3.5x15.   Carotid artery duplex 08/17/2014: No hemodynamically significant arterial disease in the internal carotid artery bilaterally. Mild soft plaque noted.  PCV ECHOCARDIOGRAM COMPLETE 07/31/2020  Narrative Echocardiogram 07/31/2020: Left ventricle cavity is normal in size and wall thickness. Normal global wall motion. Normal LV systolic function with EF 58%. Doppler evidence of grade I (impaired) diastolic dysfunction, normal LAP. Aortic valve leaflets number difficult to ascertain. Probably has calcified immobile left coronary cusp. Mild aortic valve stenosis. Vmax 2.0 m/sec, mean PG 9 mmHg, AVA 1.8 cm2 by  continuity equation. Trace tricuspid regurgitation. Estimated pulmonary artery systolic pressure 22 mmHg. No significant change compared to previous study on 08/12/2019.    Lexiscan Tetrofosmin stress test 09/30/2021: Lexiscan nuclear stress test performed using 1-day protocol. Post Lexiscan injection, chest discomfort and nausea were reported.  SPECT images show small sized, mild intensity, partially reversible perfusion defect in basal inferior/inferolateral myocardium. Stress LVEF 59%. Low risk study. Compared to 08/10/2015, no change. Previous test was with Bruce protocol in which she was able to achieve 10.16 METS.  Left Heart Catheterization 12/24/21:  LV: 154/10, EDP 28 mmHg.  Ao 144/64, mean 95 mmHg.  No pressure gradient across the aortic  valve. RCA: Dominant vessel.  Mildly diffusely diseased.  PDA has moderate diffuse disease. LM: Calcified, moderate-sized vessel.  Mild disease is noted. LAD: Large vessel, occluded in the proximal to mid segment, there is a small D1 and a large D2 which is D1 equivalent which is occluded in the ostium.  Large SP1.  Mid to distal LAD is mildly diseased. LCx: Moderate caliber vessel, calcified.  Has a high-grade 95 to 98% calcific stenosis in the proximal segment.  Gives origin to moderate-sized OM1 which is occluded in its ostium. LIMA to LAD patent. Free RIMA Y graft to D1 is patent, inferior limb to OM1 occluded.  There is a stent in the ostium of the graft.  Widely patent..  SVG to RCA occluded.   Intervention data: Successful but extremely complex and difficult PCI to the proximal and ostial circumflex followed by stenting into the left main following orbital atherectomy.  2 overlapping stents, 3.0 x 12 mm Synergy XD into the left main and into ostial circumflex overlapping the 2.5 x 24 mm Synergy XD DES.  Stenosis reduced from 95% to 0% with TIMI-3 to TIMI-3 flow maintained.        Loop Biotronik Biomonitor III for syncope and collapse 04/09/2021    Remote loop recorder transmission 05/14/2022: Normal sinus rhythm.  No symptoms reported.  EKG   EKG 05/15/2022: Normal sinus rhythm at the rate of 58 bpm, left axis deviation, left anterior fascicular block.  IVCD, borderline criteria for LVH.  Compared to 12/31/2021, no significant change.  EKG 04/30/2022: Normal sinus rhythm with rate of 61 bpm, left axis deviation, left anterior fascicular block. LVH. Compared to 12/25/2021, no significant change.   Assessment     ICD-10-CM   1. Coronary artery disease of native artery of native heart with stable angina pectoris ([place])  I25.118 EKG 12-Lead    2. Loop Biotronik Biomonitor III for syncope and collapse 04/09/2021  Z95.818     3. Primary hypertension  I10 hydrALAZINE (APRESOLINE) 25 MG  tablet    4. Syncope and collapse  R55       Meds ordered this encounter  Medications   hydrALAZINE (APRESOLINE) 25 MG tablet    Sig: Take 1 tablet (25 mg total) by mouth 3 (three) times daily as needed (For SBP >140 mm Hg).    Dispense:  270 tablet    Refill:  3   Medications Discontinued During This Encounter  Medication Reason   omega-3 acid ethyl esters (LOVAZA) 1 g capsule Patient Preference   omega-3 acid ethyl esters (LOVAZA) 1 g capsule Patient Preference   hydrALAZINE (APRESOLINE) 25 MG tablet Reorder   Orders Placed This Encounter  Procedures   EKG 12-Lead   Recommendations:   [name]  is a 77 y.o. Caucasian male patient with coronary artery SP CABG in 1996,  coronary stent to ostial free radial Y-graft to D1-OM1, chronic stable angina, mild aortic stenosis, hypertension, mixed hyperlipidemia, GERD, chronic back pain, recurrent syncope and dizziness, underwent loop recorder implantation on 04/09/2021.  Due to chest pain and worsening dyspnea, underwent cardiac catheterization and angioplasty to distal LM and ostial and proximal CX on 12/24/2021, has a patent LIMA to LAD and free radial graft with the ostial stent to D1.  1. Coronary artery disease of native artery of native heart with stable angina pectoris [facility]) Patient is about to graduate from cardiac rehab.  He has had episodes of exertional chest pain suggestive of stable angina, I reassured them.  He has diffuse small vessel disease as well leading to occasional episodes of exertional angina and as long as anginal symptoms are not on a daily basis or lifestyle limiting, continued medical therapy is advised.  His risk factors are well-controlled including hypertension, hyperlipidemia.  He has had excellent results after coronary angiography.  He has almost finished 6 months of therapy, he will be 6 months next couple weeks of DAPT, he can discontinue Plavix and continue aspirin indefinitely.  Clear-cut instructions  were given to the patient's wife.  2. Loop Biotronik Biomonitor III for syncope and collapse 04/09/2021 He has not had any further syncope, loop recorder transmission reviewed with the patient and his wife.  3. Primary hypertension Patient's blood pressure is very well-controlled, he has been taking hydralazine on a 3 times daily basis although initially written for a as needed basis, advised him to hold it if he has low blood pressure or if he is working outside to avoid dizziness.  4. Syncope and collapse No further episodes of syncope, will continue to monitor his loop recorder.  He is scheduled for orthopedic surgery soon, I do not see any contraindication for him to have hip surgery.  I will coordinate this with his surgeon.    [name], MD, [facility] 05/15/2022, 1:00 PM Office: [phone] Pager: [phone]

## 2022-05-16 DIAGNOSIS — Z95818 Presence of other cardiac implants and grafts: Secondary | ICD-10-CM | POA: Diagnosis not present

## 2022-05-16 DIAGNOSIS — Z4509 Encounter for adjustment and management of other cardiac device: Secondary | ICD-10-CM | POA: Diagnosis not present

## 2022-05-16 DIAGNOSIS — R55 Syncope and collapse: Secondary | ICD-10-CM | POA: Diagnosis not present

## 2022-05-28 DIAGNOSIS — Z125 Encounter for screening for malignant neoplasm of prostate: Secondary | ICD-10-CM | POA: Diagnosis not present

## 2022-05-28 DIAGNOSIS — K219 Gastro-esophageal reflux disease without esophagitis: Secondary | ICD-10-CM | POA: Diagnosis not present

## 2022-05-28 DIAGNOSIS — E1129 Type 2 diabetes mellitus with other diabetic kidney complication: Secondary | ICD-10-CM | POA: Diagnosis not present

## 2022-05-28 DIAGNOSIS — R7989 Other specified abnormal findings of blood chemistry: Secondary | ICD-10-CM | POA: Diagnosis not present

## 2022-05-28 DIAGNOSIS — E7800 Pure hypercholesterolemia, unspecified: Secondary | ICD-10-CM | POA: Diagnosis not present

## 2022-05-28 DIAGNOSIS — E291 Testicular hypofunction: Secondary | ICD-10-CM | POA: Diagnosis not present

## 2022-05-28 DIAGNOSIS — Z1212 Encounter for screening for malignant neoplasm of rectum: Secondary | ICD-10-CM | POA: Diagnosis not present

## 2022-06-04 DIAGNOSIS — D692 Other nonthrombocytopenic purpura: Secondary | ICD-10-CM | POA: Diagnosis not present

## 2022-06-04 DIAGNOSIS — M5116 Intervertebral disc disorders with radiculopathy, lumbar region: Secondary | ICD-10-CM | POA: Diagnosis not present

## 2022-06-04 DIAGNOSIS — I1310 Hypertensive heart and chronic kidney disease without heart failure, with stage 1 through stage 4 chronic kidney disease, or unspecified chronic kidney disease: Secondary | ICD-10-CM | POA: Diagnosis not present

## 2022-06-04 DIAGNOSIS — R82998 Other abnormal findings in urine: Secondary | ICD-10-CM | POA: Diagnosis not present

## 2022-06-04 DIAGNOSIS — R7302 Impaired glucose tolerance (oral): Secondary | ICD-10-CM | POA: Diagnosis not present

## 2022-06-04 DIAGNOSIS — E663 Overweight: Secondary | ICD-10-CM | POA: Diagnosis not present

## 2022-06-04 DIAGNOSIS — Z1331 Encounter for screening for depression: Secondary | ICD-10-CM | POA: Diagnosis not present

## 2022-06-04 DIAGNOSIS — Z9861 Coronary angioplasty status: Secondary | ICD-10-CM | POA: Diagnosis not present

## 2022-06-04 DIAGNOSIS — E1129 Type 2 diabetes mellitus with other diabetic kidney complication: Secondary | ICD-10-CM | POA: Diagnosis not present

## 2022-06-04 DIAGNOSIS — N401 Enlarged prostate with lower urinary tract symptoms: Secondary | ICD-10-CM | POA: Diagnosis not present

## 2022-06-04 DIAGNOSIS — E7800 Pure hypercholesterolemia, unspecified: Secondary | ICD-10-CM | POA: Diagnosis not present

## 2022-06-04 DIAGNOSIS — I209 Angina pectoris, unspecified: Secondary | ICD-10-CM | POA: Diagnosis not present

## 2022-06-04 DIAGNOSIS — N1831 Chronic kidney disease, stage 3a: Secondary | ICD-10-CM | POA: Diagnosis not present

## 2022-06-04 DIAGNOSIS — Z0000 Encounter for general adult medical examination without abnormal findings: Secondary | ICD-10-CM | POA: Diagnosis not present

## 2022-06-04 DIAGNOSIS — F5221 Male erectile disorder: Secondary | ICD-10-CM | POA: Diagnosis not present

## 2022-06-05 ENCOUNTER — Encounter (INDEPENDENT_AMBULATORY_CARE_PROVIDER_SITE_OTHER): Payer: PPO | Admitting: Ophthalmology

## 2022-06-05 DIAGNOSIS — I10 Essential (primary) hypertension: Secondary | ICD-10-CM

## 2022-06-05 DIAGNOSIS — H34832 Tributary (branch) retinal vein occlusion, left eye, with macular edema: Secondary | ICD-10-CM

## 2022-06-05 DIAGNOSIS — H35033 Hypertensive retinopathy, bilateral: Secondary | ICD-10-CM | POA: Diagnosis not present

## 2022-06-05 DIAGNOSIS — H43813 Vitreous degeneration, bilateral: Secondary | ICD-10-CM | POA: Diagnosis not present

## 2022-06-09 DIAGNOSIS — M1612 Unilateral primary osteoarthritis, left hip: Secondary | ICD-10-CM | POA: Diagnosis not present

## 2022-06-16 DIAGNOSIS — Z4509 Encounter for adjustment and management of other cardiac device: Secondary | ICD-10-CM | POA: Diagnosis not present

## 2022-06-16 DIAGNOSIS — R55 Syncope and collapse: Secondary | ICD-10-CM | POA: Diagnosis not present

## 2022-06-16 DIAGNOSIS — Z95818 Presence of other cardiac implants and grafts: Secondary | ICD-10-CM | POA: Diagnosis not present

## 2022-07-02 ENCOUNTER — Ambulatory Visit: Payer: PPO | Admitting: Cardiology

## 2022-07-03 ENCOUNTER — Encounter (INDEPENDENT_AMBULATORY_CARE_PROVIDER_SITE_OTHER): Payer: PPO | Admitting: Ophthalmology

## 2022-07-03 DIAGNOSIS — H35033 Hypertensive retinopathy, bilateral: Secondary | ICD-10-CM

## 2022-07-03 DIAGNOSIS — I10 Essential (primary) hypertension: Secondary | ICD-10-CM | POA: Diagnosis not present

## 2022-07-03 DIAGNOSIS — H43813 Vitreous degeneration, bilateral: Secondary | ICD-10-CM | POA: Diagnosis not present

## 2022-07-03 DIAGNOSIS — H34832 Tributary (branch) retinal vein occlusion, left eye, with macular edema: Secondary | ICD-10-CM

## 2022-07-14 DIAGNOSIS — M1612 Unilateral primary osteoarthritis, left hip: Secondary | ICD-10-CM | POA: Diagnosis not present

## 2022-07-17 DIAGNOSIS — R55 Syncope and collapse: Secondary | ICD-10-CM | POA: Diagnosis not present

## 2022-07-17 DIAGNOSIS — Z4509 Encounter for adjustment and management of other cardiac device: Secondary | ICD-10-CM | POA: Diagnosis not present

## 2022-07-17 DIAGNOSIS — Z95818 Presence of other cardiac implants and grafts: Secondary | ICD-10-CM | POA: Diagnosis not present

## 2022-07-21 NOTE — Care Plan (Signed)
Ortho Bundle Case Management Note  Patient Details  Name: [name] MRN: [id number] Date of Birth: 1946/01/11  met with patient and wife in the office for H&P. He will discharge to home with her assistance. has rolling walker. OPP[facility] set up with [facility]. discharge instructions discussed and questions answered. appointments confirmed. Patient and MD in agreement with plan. Choice offered                     DME Arranged:    DME Agency:     HH Arranged:    HH Agency:     Additional Comments: Please contact me with any questions of if this plan should need to change.  Shauna Hugh,  RN,BSN,MHA,CCM  [facility]  [phone] 07/21/2022, 11:42 AM

## 2022-07-22 NOTE — Progress Notes (Signed)
Anesthesia Review:  PCP: Cardiologist  Jacinto Halim- LOv 05/15/22  DR Alan Mulder- EYE MD- cleaance- 06/10/22 on chart Pt received eye injections every 4 weeks.   CT Chest- 05/12/21  Chest x-ray : EKG :05/15/22  Echo : 08/02/20  Monitor-- 07/29/20  Stress test: 09/30/21  Cardiac Cath :  12/24/21  Activity level:  Sleep Study/ CPAP : Fasting Blood Sugar :      / Checks Blood Sugar -- times a day:   Blood Thinner/ Instructions /Last Dose: ASA / Instructions/ Last Dose :    81 mg aspirin Plavix-

## 2022-07-23 NOTE — Patient Instructions (Signed)
SURGICAL WAITING ROOM VISITATION  Patients having surgery or a procedure may have no more than 2 support people in the waiting area - these visitors may rotate.    Children under the age of 2 must have an adult with them who is not the patient.  Due to an increase in RSV and influenza rates and associated hospitalizations, children ages 14 and under may not visit patients in [facility] hospitals.  If the patient needs to stay at the hospital during part of their recovery, the visitor guidelines for inpatient rooms apply. Pre-op nurse will coordinate an appropriate time for 1 support person to accompany patient in pre-op.  This support person may not rotate.    Please refer to the [facility] website for the visitor guidelines for Inpatients (after your surgery is over and you are in a regular room).       Your procedure is scheduled on: 08-05-22   Report to [facility] Main Entrance    Report to admitting at       0515  AM   Call this number if you have problems the morning of surgery [phone]   Do not eat food :After Midnight.   After Midnight you may have the following liquids until __0430 ____ AM/  DAY OF SURGERY   then nothing by mouth  Water Non-Citrus Juices (without pulp, NO RED-Apple, White grape, White cranberry) Black Coffee (NO MILK/CREAM OR CREAMERS, sugar ok)  Clear Tea (NO MILK/CREAM OR CREAMERS, sugar ok) regular and decaf                             Plain Jell-O (NO RED)                                           Fruit ices (not with fruit pulp, NO RED)                                     Popsicles (NO RED)                                                               Sports drinks like Gatorade (NO RED)                    The day of surgery:  Drink ONE (1) Pre-Surgery Clear Ensure  at     0415 AM the morning of surgery. Drink in one sitting. Do not sip.  This drink was given to you during your hospital  pre-op appointment visit. Nothing else to  drink after completing the  Pre-Surgery Clear Ensure by 0430 am           If you have questions, please contact your surgeon's office.   FOLLOW ANY ADDITIONAL PRE OP INSTRUCTIONS YOU RECEIVED FROM YOUR SURGEON'S OFFICE!!!     Oral Hygiene is also important to reduce your risk of infection.  Remember - BRUSH YOUR TEETH THE MORNING OF SURGERY WITH YOUR REGULAR TOOTHPASTE  DENTURES WILL BE REMOVED PRIOR TO SURGERY PLEASE DO NOT APPLY "Poly grip" OR ADHESIVES!!!   Do NOT smoke after Midnight   Take these medicines the morning of surgery with A SIP OF WATER: metoprolol, hydralazine as needed, pantoprazole  DO NOT TAKE ANY ORAL DIABETIC MEDICATIONS DAY OF YOUR SURGERY  Bring CPAP mask and tubing day of surgery.                              You may not have any metal on your body including hair pins, jewelry, and body piercing             Do not wear  lotions, powders, perfumes/cologne, or deodorant  Do not shave  48 hours prior to surgery.               Men may shave face and neck.   Do not bring valuables to the hospital. [facility] IS NOT             RESPONSIBLE   FOR VALUABLES.   Contacts, glasses, dentures or bridgework may not be worn into surgery.   Bring small overnight bag day of surgery.   DO NOT BRING YOUR HOME MEDICATIONS TO THE HOSPITAL. PHARMACY WILL DISPENSE MEDICATIONS LISTED ON YOUR MEDICATION LIST TO YOU DURING YOUR ADMISSION IN THE HOSPITAL!    Patients discharged on the day of surgery will not be allowed to drive home.  Someone NEEDS to stay with you for the first 24 hours after anesthesia.   Special Instructions: Bring a copy of your healthcare power of attorney and living will documents the day of surgery if you haven't scanned them before.              Please read over the following fact sheets you were given: IF YOU HAVE QUESTIONS ABOUT YOUR PRE-OP INSTRUCTIONS PLEASE CALL [phone]   . If you test positive for  Covid or have been in contact with anyone that has tested positive in the last 10 days please notify you surgeon.      Pre-operative 5 CHG Bath Instructions   You can play a key role in reducing the risk of infection after surgery. Your skin needs to be as free of germs as possible. You can reduce the number of germs on your skin by washing with CHG (chlorhexidine gluconate) soap before surgery. CHG is an antiseptic soap that kills germs and continues to kill germs even after washing.   DO NOT use if you have an allergy to chlorhexidine/CHG or antibacterial soaps. If your skin becomes reddened or irritated, stop using the CHG and notify one of our RNs at [phone].   Please shower with the CHG soap starting 4 days before surgery using the following schedule:     Please keep in mind the following:  DO NOT shave, including legs and underarms, starting the day of your first shower.   You may shave your face at any point before/day of surgery.  Place clean sheets on your bed the day you start using CHG soap. Use a clean washcloth (not used since being washed) for each shower. DO NOT sleep with pets once you start using the CHG.   CHG Shower Instructions:  If you choose to wash your hair and private area, wash first with your normal shampoo/soap.  After you use shampoo/soap, rinse  your hair and body thoroughly to remove shampoo/soap residue.  Turn the water OFF and apply about 3 tablespoons (45 ml) of CHG soap to a CLEAN washcloth.  Apply CHG soap ONLY FROM YOUR NECK DOWN TO YOUR TOES (washing for 3-5 minutes)  DO NOT use CHG soap on face, private areas, open wounds, or sores.  Pay special attention to the area where your surgery is being performed.  If you are having back surgery, having someone wash your back for you may be helpful. Wait 2 minutes after CHG soap is applied, then you may rinse off the CHG soap.  Pat dry with a clean towel  Put on clean clothes/pajamas   If you choose  to wear lotion, please use ONLY the CHG-compatible lotions on the back of this paper.     Additional instructions for the day of surgery: DO NOT APPLY any lotions, deodorants, cologne, or perfumes.   Put on clean/comfortable clothes.  Brush your teeth.  Ask your nurse before applying any prescription medications to the skin.      CHG Compatible Lotions   Aveeno Moisturizing lotion  Cetaphil Moisturizing Cream  Cetaphil Moisturizing Lotion  Clairol Herbal Essence Moisturizing Lotion, Dry Skin  Clairol Herbal Essence Moisturizing Lotion, Extra Dry Skin  Clairol Herbal Essence Moisturizing Lotion, Normal Skin  Curel Age Defying Therapeutic Moisturizing Lotion with Alpha Hydroxy  Curel Extreme Care Body Lotion  Curel Soothing Hands Moisturizing Hand Lotion  Curel Therapeutic Moisturizing Cream, Fragrance-Free  Curel Therapeutic Moisturizing Lotion, Fragrance-Free  Curel Therapeutic Moisturizing Lotion, Original Formula  Eucerin Daily Replenishing Lotion  Eucerin Dry Skin Therapy Plus Alpha Hydroxy Crme  Eucerin Dry Skin Therapy Plus Alpha Hydroxy Lotion  Eucerin Original Crme  Eucerin Original Lotion  Eucerin Plus Crme Eucerin Plus Lotion  Eucerin TriLipid Replenishing Lotion  Keri Anti-Bacterial Hand Lotion  Keri Deep Conditioning Original Lotion Dry Skin Formula Softly Scented  Keri Deep Conditioning Original Lotion, Fragrance Free Sensitive Skin Formula  Keri Lotion Fast Absorbing Fragrance Free Sensitive Skin Formula  Keri Lotion Fast Absorbing Softly Scented Dry Skin Formula  Keri Original Lotion  Keri Skin Renewal Lotion Keri Silky Smooth Lotion  Keri Silky Smooth Sensitive Skin Lotion  Nivea Body Creamy Conditioning Oil  Nivea Body Extra Enriched Lotion  Nivea Body Original Lotion  Nivea Body Sheer Moisturizing Lotion Nivea Crme  Nivea Skin Firming Lotion  NutraDerm 30 Skin Lotion  NutraDerm Skin Lotion  NutraDerm Therapeutic Skin Cream  NutraDerm Therapeutic  Skin Lotion  ProShield Protective Hand Cream  Provon moisturizing lotion SURGICAL WAITING ROOM VISITATION                        If you have questions, please contact your surgeon's office.       Oral Hygiene is also important to reduce your risk of infection.                                    Remember - BRUSH YOUR TEETH THE MORNING OF SURGERY WITH YOUR REGULAR TOOTHPASTE  DENTURES WILL BE REMOVED PRIOR TO SURGERY PLEASE DO NOT APPLY "Poly grip" OR ADHESIVES!!!   Do NOT smoke after Midnight   Take these medicines the morning of surgery with A SIP OF WATER:  hydralalzine if needed , toprol, protonix,   DO NOT TAKE ANY ORAL DIABETIC MEDICATIONS DAY OF YOUR SURGERY  Bring CPAP mask and tubing  day of surgery.                              You may not have any metal on your body including hair pins, jewelry, and body piercing             Do not wear, lotions, powders, perfumes/cologne, or deodorant               Men may shave face and neck.   Do not bring valuables to the hospital. [facility] IS NOT             RESPONSIBLE   FOR VALUABLES.   Contacts, glasses, dentures or bridgework may not be worn into surgery.   Bring small overnight bag day of surgery.   DO NOT BRING YOUR HOME MEDICATIONS TO THE HOSPITAL. PHARMACY WILL DISPENSE MEDICATIONS LISTED ON YOUR MEDICATION LIST TO YOU DURING YOUR ADMISSION IN THE HOSPITAL!    Patients discharged on the day of surgery will not be allowed to drive home.  Someone NEEDS to stay with you for the first 24 hours after anesthesia.   Special Instructions: Bring a copy of your healthcare power of attorney and living will documents the day of surgery if you haven't scanned them before.              Please read over the following fact sheets you were given: IF YOU HAVE QUESTIONS ABOUT YOUR PRE-OP INSTRUCTIONS PLEASE CALL [phone]   . If you test positive for Covid or have been in contact with anyone that has tested positive in the last  10 days please notify you surgeon.     Incentive Spirometer  An incentive spirometer is a tool that can help keep your lungs clear and active. This tool measures how well you are filling your lungs with each breath. Taking long deep breaths may help reverse or decrease the chance of developing breathing (pulmonary) problems (especially infection) following: A long period of time when you are unable to move or be active. BEFORE THE PROCEDURE  If the spirometer includes an indicator to show your best effort, your nurse or respiratory therapist will set it to a desired goal. If possible, sit up straight or lean slightly forward. Try not to slouch. Hold the incentive spirometer in an upright position. INSTRUCTIONS FOR USE  Sit on the edge of your bed if possible, or sit up as far as you can in bed or on a chair. Hold the incentive spirometer in an upright position. Breathe out normally. Place the mouthpiece in your mouth and seal your lips tightly around it. Breathe in slowly and as deeply as possible, raising the piston or the ball toward the top of the column. Hold your breath for 3-5 seconds or for as long as possible. Allow the piston or ball to fall to the bottom of the column. Remove the mouthpiece from your mouth and breathe out normally. Rest for a few seconds and repeat Steps 1 through 7 at least 10 times every 1-2 hours when you are awake. Take your time and take a few normal breaths between deep breaths. The spirometer may include an indicator to show your best effort. Use the indicator as a goal to work toward during each repetition. After each set of 10 deep breaths, practice coughing to be sure your lungs are clear. If you have an incision (the cut made at the time of surgery), support your  incision when coughing by placing a pillow or rolled up towels firmly against it. Once you are able to get out of bed, walk around indoors and cough well. You may stop using the incentive  spirometer when instructed by your caregiver.  RISKS AND COMPLICATIONS Take your time so you do not get dizzy or light-headed. If you are in pain, you may need to take or ask for pain medication before doing incentive spirometry. It is harder to take a deep breath if you are having pain. AFTER USE Rest and breathe slowly and easily. It can be helpful to keep track of a log of your progress. Your caregiver can provide you with a simple table to help with this. If you are using the spirometer at home, follow these instructions: SEEK MEDICAL CARE IF:  You are having difficultly using the spirometer. You have trouble using the spirometer as often as instructed. Your pain medication is not giving enough relief while using the spirometer. You develop fever of 100.5 F (38.1 C) or higher. SEEK IMMEDIATE MEDICAL CARE IF:  You cough up bloody sputum that had not been present before. You develop fever of 102 F (38.9 C) or greater. You develop worsening pain at or near the incision site. MAKE SURE YOU:  Understand these instructions. Will watch your condition. Will get help right away if you are not doing well or get worse. Document Released: 07/07/2006 Document Revised: 05/19/2011 Document Reviewed: 09/07/2006 [facility] Patient Information 2014 [place], [place].   ________________________________________________________________________

## 2022-07-24 NOTE — Progress Notes (Addendum)
Anesthesia Review:mild aortic stenosis, sodium 130, CABG,Stent, Loop recorder in place  PCP: Ernesto Rutherford ,MD Cardiologist  Jacinto Halim- LOv 05/15/22   DR Alan Mulder- EYE MD- cleaance- 06/10/22 on chart Pt received eye injections every 4 weeks.    CT Chest- 05/12/21  Chest x-ray : EKG :05/15/22  Echo : 08/02/20  Monitor-- 07/29/20  Stress test: 09/30/21  Cardiac Cath :  12/24/21   Activity level: Able to complete Adl's without SOB or CP  Sleep Study/ CPAP :  Fasting Blood Sugar :      / Checks Blood Sugar -- times a day:    Blood Thinner/ Instructions /Last Dose: ASA / Instructions/ Last Dose :   81 mg aspirin- no instructions on stopping Plavix- Stopped due to finishing the 6 mths. Post stent

## 2022-07-25 ENCOUNTER — Encounter (HOSPITAL_COMMUNITY)
Admission: RE | Admit: 2022-07-25 | Discharge: 2022-07-25 | Disposition: A | Discharge status: Home / Self Care | Payer: PPO | Source: Ambulatory Visit | Attending: Orthopedic Surgery | Admitting: Orthopedic Surgery

## 2022-07-25 ENCOUNTER — Encounter (HOSPITAL_COMMUNITY): Payer: Self-pay

## 2022-07-25 ENCOUNTER — Other Ambulatory Visit: Payer: Self-pay

## 2022-07-25 DIAGNOSIS — M1612 Unilateral primary osteoarthritis, left hip: Secondary | ICD-10-CM | POA: Diagnosis not present

## 2022-07-25 DIAGNOSIS — I350 Nonrheumatic aortic (valve) stenosis: Secondary | ICD-10-CM | POA: Insufficient documentation

## 2022-07-25 DIAGNOSIS — I25118 Atherosclerotic heart disease of native coronary artery with other forms of angina pectoris: Secondary | ICD-10-CM | POA: Insufficient documentation

## 2022-07-25 DIAGNOSIS — Z955 Presence of coronary angioplasty implant and graft: Secondary | ICD-10-CM | POA: Insufficient documentation

## 2022-07-25 DIAGNOSIS — Z01818 Encounter for other preprocedural examination: Secondary | ICD-10-CM

## 2022-07-25 DIAGNOSIS — K219 Gastro-esophageal reflux disease without esophagitis: Secondary | ICD-10-CM | POA: Diagnosis not present

## 2022-07-25 DIAGNOSIS — Z951 Presence of aortocoronary bypass graft: Secondary | ICD-10-CM | POA: Diagnosis not present

## 2022-07-25 DIAGNOSIS — I10 Essential (primary) hypertension: Secondary | ICD-10-CM | POA: Insufficient documentation

## 2022-07-25 DIAGNOSIS — I7389 Other specified peripheral vascular diseases: Secondary | ICD-10-CM | POA: Diagnosis not present

## 2022-07-25 DIAGNOSIS — Z95818 Presence of other cardiac implants and grafts: Secondary | ICD-10-CM | POA: Diagnosis not present

## 2022-07-25 DIAGNOSIS — Z01812 Encounter for preprocedural laboratory examination: Secondary | ICD-10-CM | POA: Insufficient documentation

## 2022-07-25 DIAGNOSIS — E785 Hyperlipidemia, unspecified: Secondary | ICD-10-CM | POA: Insufficient documentation

## 2022-07-25 DIAGNOSIS — Z95811 Presence of heart assist device: Secondary | ICD-10-CM | POA: Insufficient documentation

## 2022-07-25 HISTORY — DX: Headache, unspecified: R51.9

## 2022-07-25 LAB — CBC
HCT: 32.2 % — ABNORMAL LOW (ref 39.0–52.0)
Hemoglobin: 10.2 g/dL — ABNORMAL LOW (ref 13.0–17.0)
MCH: 28.7 pg (ref 26.0–34.0)
MCHC: 31.7 g/dL (ref 30.0–36.0)
MCV: 90.4 fL (ref 80.0–100.0)
Platelets: 239 10*3/uL (ref 150–400)
RBC: 3.56 MIL/uL — ABNORMAL LOW (ref 4.22–5.81)
RDW: 15.5 % (ref 11.5–15.5)
WBC: 7.7 10*3/uL (ref 4.0–10.5)
nRBC: 0 % (ref 0.0–0.2)

## 2022-07-25 LAB — BASIC METABOLIC PANEL
Anion gap: 10 (ref 5–15)
BUN: 21 mg/dL (ref 8–23)
CO2: 23 mmol/L (ref 22–32)
Calcium: 8.8 mg/dL — ABNORMAL LOW (ref 8.9–10.3)
Chloride: 97 mmol/L — ABNORMAL LOW (ref 98–111)
Creatinine, Ser: 1.08 mg/dL (ref 0.61–1.24)
GFR, Estimated: >60 mL/min (ref >60)
Glucose, Bld: 148 mg/dL — ABNORMAL HIGH (ref 70–99)
Potassium: 4.5 mmol/L (ref 3.5–5.1)
Sodium: 130 mmol/L — ABNORMAL LOW (ref 135–145)

## 2022-07-25 LAB — SURGICAL PCR SCREEN
MRSA, PCR: NEGATIVE
Staphylococcus aureus: NEGATIVE

## 2022-07-30 NOTE — Anesthesia Preprocedure Evaluation (Addendum)
Anesthesia Evaluation  Patient identified by MRN, date of birth, ID band Patient awake    Reviewed: Allergy & Precautions, H&P , NPO status , Patient's Chart, lab work & pertinent test results, reviewed documented beta blocker date and time   Airway Mallampati: II  TM Distance: >3 FB Neck ROM: full    Dental no notable dental hx.    Pulmonary neg pulmonary ROS   Pulmonary exam normal breath sounds clear to auscultation       Cardiovascular Exercise Tolerance: Good hypertension, Pt. on medications and Pt. on home beta blockers + CAD, + Cardiac Stents and + CABG   Rhythm:regular Rate:Normal  12/2021 Cath: Successful but extremely complex and difficult PCI to the proximal and ostial circumflex followed by stenting into the left main following orbital atherectomy.  2 overlapping stents, 3.0 x 12 mm Synergy XD into the left main and into ostial circumflex overlapping the 2.5 x 24 mm Synergy XD DES  Echocardiogram 07/31/2020: Left ventricle cavity is normal in size and wall thickness. Normal global wall motion. Normal LV systolic function, EF 58%. Grade I (impaired) DD, normal LAP. Aortic valve leaflets number difficult to ascertain. 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 Vmax 2.0 m/sec, mean PG 9 mmHg, AVA 1.8 cm2 by continuity equation. Trace tricuspid regurgitation.  Estimated pulmonary artery systolic pressure 22 mmHg. No significant change compared to previous study on 08/12/2019.     Lexiscan Tetrofosmin stress test 09/30/2021: Lexiscan nuclear stress test performed using 1-day protocol. Post Lexiscan injection, chest discomfort and nausea were reported.  SPECT images show small sized, mild intensity, partially reversible perfusion defect in basal inferior/inferolateral myocardium. Stress LVEF 59%. Low risk study. Compared to 08/10/2015, no change. Previous test was with Bruce protocol in which she  was able to achieve 10.16 METS.    Neuro/Psych  Headaches H/o lumbar laminectomy negative neurological ROS  negative psych ROS   GI/Hepatic Neg liver ROS,GERD  Medicated,,  Endo/Other  negative endocrine ROS    Renal/GU negative Renal ROS  negative genitourinary   Musculoskeletal  (+) Arthritis ,    Abdominal   Peds  Hematology negative hematology ROS (+) Plavix: completed 6 month post stent regimen, off for several weeks Hb 10.2, plt 239k   Anesthesia Other Findings   Reproductive/Obstetrics negative OB ROS                             Anesthesia Physical Anesthesia Plan  ASA: 3  Anesthesia Plan: Spinal   Post-op Pain Management: Tylenol PO (pre-op)*   Induction:   PONV Risk Score and Plan: 1 and Ondansetron and Treatment may vary due to age or medical condition  Airway Management Planned: Natural Airway and Simple Face Mask  Additional Equipment: None  Intra-op Plan:   Post-operative Plan:   Informed Consent: I have reviewed the patients History and Physical, chart, labs and discussed the procedure including the risks, benefits and alternatives for the proposed anesthesia with the patient or authorized representative who has indicated his/her understanding and acceptance.     Dental Advisory Given  Plan Discussed with: CRNA, Anesthesiologist and Surgeon  Anesthesia Plan Comments: (See PAT note from 5/17 by K Gekas PA-C)        Anesthesia Quick Evaluation

## 2022-07-30 NOTE — H&P (Signed)
HIP ARTHROPLASTY ADMISSION H&P  Patient ID: [name] MRN: [id number] DOB/AGE: 10/28/1945 77 y.o.  Chief Complaint: left hip pain.  Planned Procedure Date: 08/05/22 Medical Clearance by Dr. Wylene Simmer   Cardiac Clearance by Dr. Jacinto Halim   HPI: [name] is a 77 y.o. 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 The patient has a history of pain and functional disability in the left hip due to arthritis and has failed non-surgical conservative treatments for greater than 12 weeks to include NSAID's and/or analgesics, corticosteriod injections, and activity modification.  Onset of symptoms was gradual, starting 8 years ago with gradually worsening course since that time. The patient noted no past surgery on the left hip.  Patient currently rates pain at 4 out of 10 with activity. Patient has worsening of pain with activity and weight bearing and pain that interferes with activities of daily living.  Patient has evidence of joint space narrowing by imaging studies.  There is no active infection.  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   Noted on EKG   Left axis deviation 05/2018   Noted on EKG   Lipoma 04/20/2018   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   Loop Biotronik Biomonitor III for syncope and collapse 04/09/2021 04/09/2021   LVH (left ventricular hypertrophy) 05/2018   Noted on EKG   Phimosis    Pneumonia    Syncope and  collapse    Tuberculosis 1956   Wears glasses    Wheezing    Past Surgical History:  Procedure Laterality Date   ANGIOPLASTY  2010   3 stents placed   BACK SURGERY     CARDIAC CATHETERIZATION     CATARACT EXTRACTION W/ INTRAOCULAR LENS  IMPLANT, BILATERAL  2012   bilateral   CIRCUMCISION N/A 08/30/2018   Procedure: CIRCUMCISION ADULT;  Surgeon: Ihor Gully, MD;  Location: [facility];  Service: Urology;  Laterality: N/A;   COLONOSCOPY  2013, 2008   CORONARY ANGIOPLASTY     CORONARY ARTERY BYPASS GRAFT  2006   x4   CORONARY ATHERECTOMY N/A 12/24/2021   Procedure: CORONARY ATHERECTOMY;  Surgeon: Yates Decamp, MD;  Location: [facility] INVASIVE CV LAB;  Service: Cardiovascular;  Laterality: N/A;   CORONARY STENT INTERVENTION N/A 12/24/2021   Procedure: CORONARY STENT INTERVENTION;  Surgeon: Yates Decamp, MD;  Location: [facility] INVASIVE CV LAB;  Service: Cardiovascular;  Laterality: N/A;   ESOPHAGOGASTRODUODENOSCOPY  10/2011   EYE SURGERY     KNEE ARTHROSCOPY  2001   LEFT HEART CATH AND CORS/GRAFTS ANGIOGRAPHY N/A 12/24/2021   Procedure: LEFT HEART CATH AND CORS/GRAFTS ANGIOGRAPHY;  Surgeon: Yates Decamp, MD;  Location: [facility] INVASIVE CV LAB;  Service: Cardiovascular;  Laterality: N/A;   LUMBAR DISC SURGERY  1998   LUMBAR LAMINECTOMY/DECOMPRESSION MICRODISCECTOMY N/A 07/13/2019   Procedure: Laminectomy and  Foraminotomy - Lumbar Two-Lumbar Three - Lumbar Three-Lumbar Four;  Surgeon: Tia Alert, MD;  Location: [facility] OR;  Service: Neurosurgery;  Laterality: N/A;  Laminectomy and Foraminotomy - Lumbar Two-Lumbar Three - Lumbar Three-Lumbar Four   ROTATOR CUFF REPAIR Left 05/24/2020   TENDON RECONSTRUCTION Right 10/27/2013   Procedure: RIGHT OPEN TRICEPS REPAIR;  Surgeon: Sheral Apley, MD;  Location: [facility];  Service: Orthopedics;  Laterality: Right;   Allergies  Allergen Reactions   Codeine Nausea And Vomiting   Guaifenesin & Derivatives Nausea And Vomiting    Terbinafine And Related Other (See Comments)    Muscle pain   Iodinated Contrast Media Itching   Penicillins Rash    Tolerates Cefazolin without problems...WW   Prior to Admission medications   Medication Sig Start Date End Date Taking? Authorizing Provider  acetaminophen (TYLENOL) 500 MG tablet Take 1,000 mg by mouth every 6 (six) hours as needed for moderate pain.   Yes [provider]  aspirin EC 81 MG tablet Take 81 mg by mouth daily. Swallow whole.   Yes [provider]  Calcium Carbonate Antacid (TUMS ULTRA PO) Take 2 tablets by mouth daily as needed (acid reflux/indigestion.).   Yes [provider]  Coenzyme Q10 (COQ10 PO) Take 300 mg by mouth 2 (two) times daily.   Yes [provider]  Cyanocobalamin (VITAMIN B-12) 5000 MCG TBDP Take 5,000 mcg by mouth every evening. 12/07/20  Yes [provider]  ezetimibe (ZETIA) 10 MG tablet TAKE 1/2 TABLET BY MOUTH EVERY EVENING 08/21/21  Yes Yates Decamp, MD  fish oil-omega-3 fatty acids 1000 MG capsule Take 1 g by mouth in the morning and at bedtime.   Yes [provider]  fluconazole (DIFLUCAN) 200 MG tablet Take 200 mg by mouth once a week. 05/14/22  Yes [provider]  hydrALAZINE (APRESOLINE) 25 MG tablet Take 1 tablet (25 mg total) by mouth 3 (three) times daily as needed (For SBP >140 mm Hg). 05/15/22 05/10/23 Yes Yates Decamp, MD  losartan (COZAAR) 25 MG tablet TAKE 1/2 TABLET BY MOUTH EVERY EVENING 08/21/21  Yes Yates Decamp, MD  metoprolol succinate (TOPROL-XL) 25 MG 24 hr tablet Take 0.5 tablets (12.5 mg total) by mouth every morning. 12/13/21  Yes Yates Decamp, MD  Multiple Vitamin (MULTIVITAMIN) tablet Take 1 tablet by mouth in the morning. Seniors   Yes [provider]  Multiple Vitamins-Minerals (PRESERVISION AREDS 2) CAPS Take 1 capsule by mouth 2 (two) times daily.   Yes [provider]  nitroGLYCERIN (NITROSTAT) 0.4 MG SL tablet TAKE 1 TABLET EVERY 5 MINUTES AS NEEDED  FOR CHEST PAIN. 04/18/22  Yes Yates Decamp, MD  nortriptyline (PAMELOR) 25 MG capsule TAKE 1 CAPSULE BY MOUTH AT BEDTIME. Patient taking differently: Take 25 mg by mouth at bedtime as needed (Headache). 10/15/21  Yes Rodolph Bong, MD  pantoprazole (PROTONIX) 40 MG tablet Take 1 tablet (40 mg total) by mouth daily. 03/17/16  Yes Elgergawy, Leana Roe, MD  Polyethyl Glycol-Propyl Glycol (SYSTANE) 0.4-0.3 % GEL ophthalmic gel Place 1 application into both eyes at bedtime.   Yes [provider]  Propylene Glycol (SYSTANE BALANCE) 0.6 % SOLN Place 1 drop into both eyes 3 (three) times daily as needed (dry/irritated eyes.).   Yes [provider]  pyridoxine (B-6) 100 MG tablet Take 100 mg by mouth every evening.   Yes [provider]  rosuvastatin (CRESTOR) 20 MG tablet TAKE ONE TABLET BY MOUTH EVERYDAY AT BEDTIME 05/06/22  Yes Yates Decamp, MD  traMADol (ULTRAM) 50 MG tablet Take 50 mg by mouth every 6 (six) hours as needed for moderate pain.   Yes [provider]  vitamin C (ASCORBIC ACID) 500 MG tablet Take 500 mg by mouth daily.   Yes [provider]  clopidogrel (PLAVIX) 75 MG tablet TAKE 1 TABLET BY MOUTH DAILY WITH BREAKFAST. Patient not taking: Reported on 07/24/2022 03/17/22   Yates Decamp, MD   Social History   Socioeconomic History   Marital status: Married    Spouse name: Not on file   Number of children: 3   Years of education: 11   Highest education level: 11th grade  Occupational History   Occupation: Retired  Tobacco Use   Smoking status: Never   Smokeless tobacco: Never  Building services engineer Use: Never used  Substance and Sexual Activity   Alcohol use: No   Drug use: No   Sexual activity: Not Currently  Other Topics Concern   Not on file  Social History Narrative   Not on file   Social Determinants of Health   Financial Resource Strain: Not on file  Food Insecurity: Not on file  Transportation Needs: Not on file  Physical Activity: Not  on file  Stress: Not on file  Social Connections: Not on file   Family History  Problem Relation Age of Onset   Colon cancer Mother 86   Hypertension Mother    Colon cancer Father 43   Heart attack Brother    Heart failure Brother    Stomach cancer Neg Hx     ROS: Currently denies lightheadedness, dizziness, Fever, chills, CP, SOB.   No personal history of DVT, PE, MI, or CVA. No loose teeth or dentures All other systems have been reviewed and were otherwise currently negative with the exception of those mentioned in the HPI and as above.  Objective: Vitals: HT: 5\' 7"   WT: 188 T: 98.7  BP 123/70 P: 58  O2 SAT: 98% on room air.  Physical Exam: General: Alert, NAD. Trendelenberg Gait  HEENT: EOMI, Good Neck Extension  Pulm: No increased work of breathing.  Clear B/L A/P w/o crackle or wheeze.  CV: RRR, No m/g/r appreciated  GI: soft, NT, ND Neuro: Neuro without gross focal deficit.  Sensation intact distally Skin: No lesions in the area of chief complaint MSK/Surgical Site: He has about 0-85 degrees of forward flexion of the left hip. Minimal internal rotation. EHL and FHL intact. Distal sensation intact. There are 2+ PT pulses.  Imaging Review Plain radiographs demonstrate severe degenerative joint disease of the left hip.   The bone quality appears to be adequate for age and reported activity level.  Preoperative templating of the joint replacement has been completed, documented, and submitted to the Operating Room personnel in order to optimize intra-operative equipment management.  Assessment: left hip DJD Active Problems:   * No active hospital problems. *   Plan: Plan for Procedure(s): TOTAL HIP ARTHROPLASTY  The patient history, physical exam, clinical judgement of the provider and imaging are consistent with end stage degenerative joint disease and total joint arthroplasty is deemed medically necessary. The treatment options including medical management,  injection therapy, and arthroplasty were discussed at length. The risks and benefits of Procedure(s): TOTAL HIP ARTHROPLASTY were presented and reviewed.  The risks of nonoperative treatment, versus surgical intervention including but not limited to continued pain, aseptic loosening, stiffness, dislocation/subluxation, infection, bleeding, nerve injury, blood clots, cardiopulmonary complications, morbidity,  mortality, among others were discussed. The patient verbalizes understanding and wishes to proceed with the plan.  Patient is being admitted for surgery, pain control, PT, prophylactic antibiotics, VTE prophylaxis, progressive ambulation, ADL's and discharge planning.    The patient does meet the criteria for TXA which will be used perioperatively.   ASA 325 mg  will be used postoperatively for DVT prophylaxis in addition to SCDs, and early ambulation.   Armida Sans, PA-C 07/30/2022 10:14 AM

## 2022-07-30 NOTE — Progress Notes (Signed)
Case: [id number] Date/Time: 08/05/22 0912   Procedure: TOTAL HIP ARTHROPLASTY (Left: Hip)   Anesthesia type: Spinal   Pre-op diagnosis: left hip DJD   Location: [facility] ROOM 08 / [facility]   Surgeons: Teryl Lucy, MD       DISCUSSION: [name] is a 77 year old male who presents to PAT prior to left THA.  Past medical history significant for hypertension, coronary artery disease s/p CABG (1996) and PCI (12/2021), chronic stable angina, mild aortic stenosis, GERD, history of esophageal stricture, history of syncope status post loop recorder (03/2021).  No prior anesthesia complications.  Patient last saw his cardiologist on 05/15/2022.  He completed cardiac rehab after stenting last October.  Dr. Jacinto Halim notes that he continues to have episodes of angina which is chronic and occasional episodes of dizziness. No further episodes of syncope. Per Dr. Jacinto Halim at last office visit:  "He has had episodes of exertional chest pain suggestive of stable angina, I reassured them. He has diffuse small vessel disease as well leading to occasional episodes of exertional angina and as long as anginal symptoms are not on a daily basis or lifestyle limiting, continued medical therapy is advised. His risk factors are well-controlled including hypertension, hyperlipidemia. He has had excellent results after coronary angiography.  He is scheduled for orthopedic surgery soon, I do not see any contraindication for him to have hip surgery.  I will coordinate this with his surgeon."     VS: BP (!) 151/66   Pulse 65   Temp 37.1 C (Oral)   Resp 16   Ht 5\' 7"  (1.702 m)   Wt 79.8 kg   SpO2 100%   BMI 27.57 kg/m   PROVIDERS: Tisovec, Adelfa Koh, MD   LABS: Labs reviewed: Acceptable for surgery. (all labs ordered are listed, but only abnormal results are displayed)  Labs Reviewed  BASIC METABOLIC PANEL - Abnormal; Notable for the following components:      Result Value   Sodium 130 (*)    Chloride 97 (*)     Glucose, Bld 148 (*)    Calcium 8.8 (*)    All other components within normal limits  CBC - Abnormal; Notable for the following components:   RBC 3.56 (*)    Hemoglobin 10.2 (*)    HCT 32.2 (*)    All other components within normal limits  SURGICAL PCR SCREEN     IMAGES:  CT chest/abdomen/pelvis 09/11/21:  CT CHEST FINDINGS   Cardiovascular: No significant vascular findings. Normal heart size. Post CABG. No pericardial effusion.   Mediastinum/Nodes: No mediastinal hematoma. Thyroid and esophagus are unremarkable.   Lungs/Pleura: No consolidation or mass. No pleural effusion or pneumothorax.   Musculoskeletal: No chest wall hematoma.  No acute fracture.   CT ABDOMEN PELVIS FINDINGS   Hepatobiliary: No hepatic injury or perihepatic hematoma. Gallbladder is unremarkable.   Pancreas: Unremarkable.   Spleen: Unremarkable.   Adrenals/Urinary Tract: Adrenals and kidneys are unremarkable. Dilatation of the distal left ureter similar to prior study. No evidence of bladder injury.   Stomach/Bowel: Stomach is within normal limits. Bowel is normal in caliber. Normal appendix.   Vascular/Lymphatic: Diffuse atherosclerosis.  No enlarged nodes.   Reproductive: Stable appearance of prostate.   Other: No free fluid.  No abdominal wall hematoma.   Musculoskeletal: No acute fracture. Lumbar spine dictated separately.   IMPRESSION: No evidence of acute traumatic injury.    EKG 05/15/2022:   Normal sinus rhythm at the rate of 58 bpm, left axis deviation,  left anterior fascicular block.  IVCD, borderline criteria for LVH.  Compared to 12/31/2021, no significant change.    CV:  Cardiac cath 06/2008 (CABG 1996.): LIMA to LAD, Skip Free radial graft to OM1 + D1 with ostial 4x15 non DES, has residual stenosis in the OM 1 limb of the skip graft. SVG to PD, Xience Stent to RCA 3x18, 3.5x15.    Carotid artery duplex 08/17/2014: No hemodynamically significant arterial disease in the  internal carotid artery bilaterally. Mild soft plaque noted.   PCV ECHOCARDIOGRAM COMPLETE 07/31/2020   Narrative Echocardiogram 07/31/2020: Left ventricle cavity is normal in size and wall thickness. Normal global wall motion. Normal LV systolic function with EF 58%. Doppler evidence of grade I (impaired) diastolic dysfunction, normal LAP. Aortic valve leaflets number difficult to ascertain. Probably has calcified immobile left coronary cusp. Mild aortic valve stenosis. Vmax 2.0 m/sec, mean PG 9 mmHg, AVA 1.8 cm2 by continuity equation. Trace tricuspid regurgitation. Estimated pulmonary artery systolic pressure 22 mmHg. No significant change compared to previous study on 08/12/2019.     Lexiscan Tetrofosmin stress test 09/30/2021: Lexiscan nuclear stress test performed using 1-day protocol. Post Lexiscan injection, chest discomfort and nausea were reported.  SPECT images show small sized, mild intensity, partially reversible perfusion defect in basal inferior/inferolateral myocardium. Stress LVEF 59%. Low risk study. Compared to 08/10/2015, no change. Previous test was with Bruce protocol in which she was able to achieve 10.16 METS.   Left Heart Catheterization 12/24/21:  LV: 154/10, EDP 28 mmHg.  Ao 144/64, mean 95 mmHg.  No pressure gradient across the aortic valve. RCA: Dominant vessel.  Mildly diffusely diseased.  PDA has moderate diffuse disease. LM: Calcified, moderate-sized vessel.  Mild disease is noted. LAD: Large vessel, occluded in the proximal to mid segment, there is a small D1 and a large D2 which is D1 equivalent which is occluded in the ostium.  Large SP1.  Mid to distal LAD is mildly diseased. LCx: Moderate caliber vessel, calcified.  Has a high-grade 95 to 98% calcific stenosis in the proximal segment.  Gives origin to moderate-sized OM1 which is occluded in its ostium. LIMA to LAD patent. Free RIMA Y graft to D1 is patent, inferior limb to OM1 occluded.  There is a stent in  the ostium of the graft.  Widely patent..  SVG to RCA occluded.   Intervention data: Successful but extremely complex and difficult PCI to the proximal and ostial circumflex followed by stenting into the left main following orbital atherectomy.  2 overlapping stents, 3.0 x 12 mm Synergy XD into the left main and into ostial circumflex overlapping the 2.5 x 24 mm Synergy XD DES.  Stenosis reduced from 95% to 0% with TIMI-3 to TIMI-3 flow maintained.  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   Noted on EKG   Left axis deviation 05/2018  Noted on EKG   Lipoma 04/20/2018   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   Loop Biotronik Biomonitor III for syncope and collapse 04/09/2021 04/09/2021   LVH (left ventricular hypertrophy) 05/2018   Noted on EKG   Phimosis    Pneumonia    Syncope and collapse    Tuberculosis 1956   Wears glasses    Wheezing     Past Surgical History:  Procedure Laterality Date   ANGIOPLASTY  2010   3 stents placed   BACK SURGERY     CARDIAC CATHETERIZATION     CATARACT EXTRACTION W/ INTRAOCULAR LENS  IMPLANT, BILATERAL  2012   bilateral   CIRCUMCISION N/A 08/30/2018   Procedure: CIRCUMCISION ADULT;  Surgeon: Ihor Gully, MD;  Location: [facility];  Service: Urology;  Laterality: N/A;   COLONOSCOPY  2013, 2008   CORONARY ANGIOPLASTY     CORONARY ARTERY BYPASS GRAFT  2006   x4   CORONARY  ATHERECTOMY N/A 12/24/2021   Procedure: CORONARY ATHERECTOMY;  Surgeon: Yates Decamp, MD;  Location: [facility] INVASIVE CV LAB;  Service: Cardiovascular;  Laterality: N/A;   CORONARY STENT INTERVENTION N/A 12/24/2021   Procedure: CORONARY STENT INTERVENTION;  Surgeon: Yates Decamp, MD;  Location: [facility] INVASIVE CV LAB;  Service: Cardiovascular;  Laterality: N/A;   ESOPHAGOGASTRODUODENOSCOPY  10/2011   EYE SURGERY     KNEE ARTHROSCOPY  2001   LEFT HEART CATH AND CORS/GRAFTS ANGIOGRAPHY N/A 12/24/2021   Procedure: LEFT HEART CATH AND CORS/GRAFTS ANGIOGRAPHY;  Surgeon: Yates Decamp, MD;  Location: [facility] INVASIVE CV LAB;  Service: Cardiovascular;  Laterality: N/A;   LUMBAR DISC SURGERY  1998   LUMBAR LAMINECTOMY/DECOMPRESSION MICRODISCECTOMY N/A 07/13/2019   Procedure: Laminectomy and Foraminotomy - Lumbar Two-Lumbar Three - Lumbar Three-Lumbar Four;  Surgeon: Tia Alert, MD;  Location: [facility] OR;  Service: Neurosurgery;  Laterality: N/A;  Laminectomy and Foraminotomy - Lumbar Two-Lumbar Three - Lumbar Three-Lumbar Four   ROTATOR CUFF REPAIR Left 05/24/2020   TENDON RECONSTRUCTION Right 10/27/2013   Procedure: RIGHT OPEN TRICEPS REPAIR;  Surgeon: Sheral Apley, MD;  Location: [facility];  Service: Orthopedics;  Laterality: Right;    MEDICATIONS:  acetaminophen (TYLENOL) 500 MG tablet   aspirin EC 81 MG tablet   Calcium Carbonate Antacid (TUMS ULTRA PO)   clopidogrel (PLAVIX) 75 MG tablet   Coenzyme Q10 (COQ10 PO)   Cyanocobalamin (VITAMIN B-12) 5000 MCG TBDP   ezetimibe (ZETIA) 10 MG tablet   fish oil-omega-3 fatty acids 1000 MG capsule   fluconazole (DIFLUCAN) 200 MG tablet   hydrALAZINE (APRESOLINE) 25 MG tablet   losartan (COZAAR) 25 MG tablet   metoprolol succinate (TOPROL-XL) 25 MG 24 hr tablet   Multiple Vitamin (MULTIVITAMIN) tablet   Multiple Vitamins-Minerals (PRESERVISION AREDS 2) CAPS   nitroGLYCERIN (NITROSTAT) 0.4 MG SL tablet   nortriptyline (PAMELOR) 25 MG capsule    pantoprazole (PROTONIX) 40 MG tablet   Polyethyl Glycol-Propyl Glycol (SYSTANE) 0.4-0.3 % GEL ophthalmic gel   Propylene Glycol (SYSTANE BALANCE) 0.6 % SOLN   pyridoxine (B-6) 100 MG tablet   rosuvastatin (CRESTOR) 20 MG tablet   traMADol (ULTRAM) 50 MG tablet   vitamin C (ASCORBIC ACID) 500 MG tablet   No current facility-administered medications for this encounter.    Marcille Blanco [facility] Surgical Short Stay/Anesthesiology [facility] Phone [phone] 07/30/2022 2:42 PM

## 2022-07-31 ENCOUNTER — Encounter (INDEPENDENT_AMBULATORY_CARE_PROVIDER_SITE_OTHER): Payer: PPO | Admitting: Ophthalmology

## 2022-07-31 DIAGNOSIS — H43813 Vitreous degeneration, bilateral: Secondary | ICD-10-CM

## 2022-07-31 DIAGNOSIS — H35033 Hypertensive retinopathy, bilateral: Secondary | ICD-10-CM | POA: Diagnosis not present

## 2022-07-31 DIAGNOSIS — I10 Essential (primary) hypertension: Secondary | ICD-10-CM | POA: Diagnosis not present

## 2022-07-31 DIAGNOSIS — H34832 Tributary (branch) retinal vein occlusion, left eye, with macular edema: Secondary | ICD-10-CM | POA: Diagnosis not present

## 2022-08-05 ENCOUNTER — Observation Stay (HOSPITAL_COMMUNITY)
Admission: RE | Admit: 2022-08-05 | Discharge: 2022-08-06 | Disposition: A | Discharge status: Home / Self Care | Payer: PPO | Attending: Orthopedic Surgery | Admitting: Orthopedic Surgery

## 2022-08-05 ENCOUNTER — Encounter (HOSPITAL_COMMUNITY)
Admission: RE | Disposition: A | Discharge status: Home / Self Care | Payer: Self-pay | Source: Home / Self Care | Attending: Orthopedic Surgery

## 2022-08-05 ENCOUNTER — Encounter (HOSPITAL_COMMUNITY): Payer: Self-pay | Admitting: Orthopedic Surgery

## 2022-08-05 ENCOUNTER — Observation Stay (HOSPITAL_COMMUNITY): Payer: PPO

## 2022-08-05 ENCOUNTER — Other Ambulatory Visit: Payer: Self-pay

## 2022-08-05 ENCOUNTER — Ambulatory Visit (HOSPITAL_BASED_OUTPATIENT_CLINIC_OR_DEPARTMENT_OTHER): Payer: PPO | Admitting: Certified Registered Nurse Anesthetist

## 2022-08-05 ENCOUNTER — Ambulatory Visit (HOSPITAL_COMMUNITY): Payer: PPO | Admitting: Medical

## 2022-08-05 DIAGNOSIS — I2510 Atherosclerotic heart disease of native coronary artery without angina pectoris: Secondary | ICD-10-CM | POA: Insufficient documentation

## 2022-08-05 DIAGNOSIS — Z955 Presence of coronary angioplasty implant and graft: Secondary | ICD-10-CM | POA: Diagnosis not present

## 2022-08-05 DIAGNOSIS — M1612 Unilateral primary osteoarthritis, left hip: Secondary | ICD-10-CM | POA: Diagnosis not present

## 2022-08-05 DIAGNOSIS — Z471 Aftercare following joint replacement surgery: Secondary | ICD-10-CM | POA: Diagnosis not present

## 2022-08-05 DIAGNOSIS — Z96642 Presence of left artificial hip joint: Secondary | ICD-10-CM | POA: Diagnosis not present

## 2022-08-05 DIAGNOSIS — I10 Essential (primary) hypertension: Secondary | ICD-10-CM | POA: Insufficient documentation

## 2022-08-05 DIAGNOSIS — Z7982 Long term (current) use of aspirin: Secondary | ICD-10-CM | POA: Insufficient documentation

## 2022-08-05 DIAGNOSIS — Z951 Presence of aortocoronary bypass graft: Secondary | ICD-10-CM | POA: Diagnosis not present

## 2022-08-05 DIAGNOSIS — Z85828 Personal history of other malignant neoplasm of skin: Secondary | ICD-10-CM | POA: Diagnosis not present

## 2022-08-05 DIAGNOSIS — Z01818 Encounter for other preprocedural examination: Secondary | ICD-10-CM

## 2022-08-05 DIAGNOSIS — Z79899 Other long term (current) drug therapy: Secondary | ICD-10-CM | POA: Diagnosis not present

## 2022-08-05 HISTORY — PX: TOTAL HIP ARTHROPLASTY: SHX124

## 2022-08-05 SURGERY — ARTHROPLASTY, HIP, TOTAL,POSTERIOR APPROACH
Anesthesia: Spinal | Site: Hip | Laterality: Left

## 2022-08-05 MED ORDER — PROPOFOL 10 MG/ML IV BOLUS
INTRAVENOUS | Status: DC | PRN
  Administered 2022-08-05 (×2): 20 mg via INTRAVENOUS

## 2022-08-05 MED ORDER — POVIDONE-IODINE 10 % EX SWAB: 2.0000 | Freq: Once | CUTANEOUS | Status: DC

## 2022-08-05 MED ORDER — 0.9 % SODIUM CHLORIDE (POUR BTL) OPTIME
TOPICAL | Status: DC | PRN
  Administered 2022-08-05: 1000 mL

## 2022-08-05 MED ORDER — PHENYLEPHRINE HCL-NACL 20-0.9 MG/250ML-% IV SOLN
INTRAVENOUS | Status: AC
  Filled 2022-08-05: qty 250

## 2022-08-05 MED ORDER — OXYCODONE HCL 5 MG/5ML PO SOLN: 5.0000 mg | Freq: Once | ORAL | Status: DC | PRN

## 2022-08-05 MED ORDER — ONDANSETRON HCL 4 MG/2ML IJ SOLN
INTRAMUSCULAR | Status: DC | PRN
  Administered 2022-08-05: 4 mg via INTRAVENOUS

## 2022-08-05 MED ORDER — HYDROMORPHONE HCL 1 MG/ML IJ SOLN: 0.5000 mg | INTRAMUSCULAR | Status: DC | PRN

## 2022-08-05 MED ORDER — NITROGLYCERIN 0.4 MG SL SUBL: 0.4000 mg | SUBLINGUAL_TABLET | SUBLINGUAL | Status: DC | PRN

## 2022-08-05 MED ORDER — ASPIRIN 325 MG PO TBEC
325.0000 mg | DELAYED_RELEASE_TABLET | Freq: Every day | ORAL | Status: DC
  Administered 2022-08-06: 325 mg via ORAL
  Filled 2022-08-05: qty 1

## 2022-08-05 MED ORDER — DOCUSATE SODIUM 100 MG PO CAPS
100.0000 mg | ORAL_CAPSULE | Freq: Two times a day (BID) | ORAL | Status: DC
  Administered 2022-08-05 – 2022-08-06 (×3): 100 mg via ORAL
  Filled 2022-08-05 (×3): qty 1

## 2022-08-05 MED ORDER — CEFAZOLIN SODIUM-DEXTROSE 2-4 GM/100ML-% IV SOLN
2.0000 g | Freq: Four times a day (QID) | INTRAVENOUS | Status: AC
  Administered 2022-08-05 (×2): 2 g via INTRAVENOUS
  Filled 2022-08-05 (×2): qty 100

## 2022-08-05 MED ORDER — POLYETHYLENE GLYCOL 3350 17 G PO PACK: 17.0000 g | PACK | Freq: Every day | ORAL | Status: DC | PRN

## 2022-08-05 MED ORDER — DEXAMETHASONE SODIUM PHOSPHATE 10 MG/ML IJ SOLN
INTRAMUSCULAR | Status: DC | PRN
  Administered 2022-08-05: 8 mg via INTRAVENOUS

## 2022-08-05 MED ORDER — STERILE WATER FOR IRRIGATION IR SOLN
Status: DC | PRN
  Administered 2022-08-05: 2000 mL

## 2022-08-05 MED ORDER — ACETAMINOPHEN 325 MG PO TABS: 325.0000 mg | ORAL_TABLET | Freq: Four times a day (QID) | ORAL | Status: DC | PRN

## 2022-08-05 MED ORDER — TRAMADOL HCL 50 MG PO TABS
50.0000 mg | ORAL_TABLET | ORAL | Status: DC | PRN
  Administered 2022-08-05 – 2022-08-06 (×4): 50 mg via ORAL
  Filled 2022-08-05 (×4): qty 1

## 2022-08-05 MED ORDER — ROSUVASTATIN CALCIUM 20 MG PO TABS
20.0000 mg | ORAL_TABLET | Freq: Every day | ORAL | Status: DC
  Administered 2022-08-05: 20 mg via ORAL
  Filled 2022-08-05: qty 1

## 2022-08-05 MED ORDER — DIPHENHYDRAMINE HCL 12.5 MG/5ML PO ELIX: 12.5000 mg | ORAL_SOLUTION | ORAL | Status: DC | PRN

## 2022-08-05 MED ORDER — ORAL CARE MOUTH RINSE: 15.0000 mL | OROMUCOSAL | Status: DC | PRN

## 2022-08-05 MED ORDER — OXYCODONE HCL 5 MG PO TABS: 5.0000 mg | ORAL_TABLET | Freq: Once | ORAL | Status: DC | PRN

## 2022-08-05 MED ORDER — PROPOFOL 500 MG/50ML IV EMUL
INTRAVENOUS | Status: DC | PRN
  Administered 2022-08-05: 50 ug/kg/min via INTRAVENOUS

## 2022-08-05 MED ORDER — POLYVINYL ALCOHOL 1.4 % OP SOLN
1.0000 [drp] | Freq: Every day | OPHTHALMIC | Status: DC
  Filled 2022-08-05: qty 15

## 2022-08-05 MED ORDER — ONDANSETRON HCL 4 MG PO TABS: 4.0000 mg | ORAL_TABLET | Freq: Four times a day (QID) | ORAL | Status: DC | PRN

## 2022-08-05 MED ORDER — HYDROMORPHONE HCL 1 MG/ML IJ SOLN: 0.2500 mg | INTRAMUSCULAR | Status: DC | PRN

## 2022-08-05 MED ORDER — HYDRALAZINE HCL 25 MG PO TABS
25.0000 mg | ORAL_TABLET | Freq: Three times a day (TID) | ORAL | Status: DC | PRN
  Administered 2022-08-06: 25 mg via ORAL
  Filled 2022-08-05: qty 1

## 2022-08-05 MED ORDER — PHENYLEPHRINE 80 MCG/ML (10ML) SYRINGE FOR IV PUSH (FOR BLOOD PRESSURE SUPPORT)
PREFILLED_SYRINGE | INTRAVENOUS | Status: DC | PRN
  Administered 2022-08-05: 80 ug via INTRAVENOUS

## 2022-08-05 MED ORDER — PROPYLENE GLYCOL 0.6 % OP SOLN: 1.0000 [drp] | Freq: Three times a day (TID) | OPHTHALMIC | Status: DC | PRN

## 2022-08-05 MED ORDER — KETOROLAC TROMETHAMINE 30 MG/ML IJ SOLN
INTRAMUSCULAR | Status: DC | PRN
  Administered 2022-08-05: 30 mg

## 2022-08-05 MED ORDER — PROMETHAZINE HCL 25 MG/ML IJ SOLN: 6.2500 mg | INTRAMUSCULAR | Status: DC | PRN

## 2022-08-05 MED ORDER — ONDANSETRON HCL 4 MG/2ML IJ SOLN: 4.0000 mg | Freq: Four times a day (QID) | INTRAMUSCULAR | Status: DC | PRN

## 2022-08-05 MED ORDER — PHENYLEPHRINE 80 MCG/ML (10ML) SYRINGE FOR IV PUSH (FOR BLOOD PRESSURE SUPPORT)
PREFILLED_SYRINGE | INTRAVENOUS | Status: AC
  Filled 2022-08-05: qty 10

## 2022-08-05 MED ORDER — PHENOL 1.4 % MT LIQD: 1.0000 | OROMUCOSAL | Status: DC | PRN

## 2022-08-05 MED ORDER — PHENYLEPHRINE HCL-NACL 20-0.9 MG/250ML-% IV SOLN
INTRAVENOUS | Status: DC | PRN
  Administered 2022-08-05: 20 ug/min via INTRAVENOUS

## 2022-08-05 MED ORDER — CHLORHEXIDINE GLUCONATE 0.12 % MT SOLN
15.0000 mL | Freq: Once | OROMUCOSAL | Status: AC
  Administered 2022-08-05: 15 mL via OROMUCOSAL

## 2022-08-05 MED ORDER — LOSARTAN POTASSIUM 25 MG PO TABS
12.5000 mg | ORAL_TABLET | Freq: Every evening | ORAL | Status: DC
  Administered 2022-08-05: 12.5 mg via ORAL
  Filled 2022-08-05: qty 1

## 2022-08-05 MED ORDER — EZETIMIBE 10 MG PO TABS
5.0000 mg | ORAL_TABLET | Freq: Every evening | ORAL | Status: DC
  Administered 2022-08-05: 5 mg via ORAL
  Filled 2022-08-05: qty 1

## 2022-08-05 MED ORDER — LACTATED RINGERS IV SOLN: INTRAVENOUS | Status: DC

## 2022-08-05 MED ORDER — MENTHOL 3 MG MT LOZG: 1.0000 | LOZENGE | OROMUCOSAL | Status: DC | PRN

## 2022-08-05 MED ORDER — ALUM & MAG HYDROXIDE-SIMETH 200-200-20 MG/5ML PO SUSP: 30.0000 mL | ORAL | Status: DC | PRN

## 2022-08-05 MED ORDER — ACETAMINOPHEN 500 MG PO TABS
1000.0000 mg | ORAL_TABLET | Freq: Four times a day (QID) | ORAL | Status: AC
  Administered 2022-08-05 – 2022-08-06 (×4): 1000 mg via ORAL
  Filled 2022-08-05 (×4): qty 2

## 2022-08-05 MED ORDER — TRANEXAMIC ACID-NACL 1000-0.7 MG/100ML-% IV SOLN
1000.0000 mg | Freq: Once | INTRAVENOUS | Status: AC
  Administered 2022-08-05: 1000 mg via INTRAVENOUS
  Filled 2022-08-05: qty 100

## 2022-08-05 MED ORDER — ACETAMINOPHEN 500 MG PO TABS
1000.0000 mg | ORAL_TABLET | Freq: Once | ORAL | Status: AC
  Administered 2022-08-05: 1000 mg via ORAL

## 2022-08-05 MED ORDER — POVIDONE-IODINE 7.5 % EX SOLN: Freq: Once | CUTANEOUS | Status: DC

## 2022-08-05 MED ORDER — EPHEDRINE 5 MG/ML INJ
INTRAVENOUS | Status: AC
  Filled 2022-08-05: qty 5

## 2022-08-05 MED ORDER — BUPIVACAINE IN DEXTROSE 0.75-8.25 % IT SOLN
INTRATHECAL | Status: DC | PRN
  Administered 2022-08-05: 1.6 mL via INTRATHECAL

## 2022-08-05 MED ORDER — METOPROLOL SUCCINATE ER 25 MG PO TB24
12.5000 mg | ORAL_TABLET | ORAL | Status: DC
  Administered 2022-08-06: 12.5 mg via ORAL
  Filled 2022-08-05: qty 1

## 2022-08-05 MED ORDER — NORTRIPTYLINE HCL 25 MG PO CAPS: 25.0000 mg | ORAL_CAPSULE | Freq: Every evening | ORAL | Status: DC | PRN

## 2022-08-05 MED ORDER — BUPIVACAINE HCL (PF) 0.25 % IJ SOLN
INTRAMUSCULAR | Status: DC | PRN
  Administered 2022-08-05: 30 mL

## 2022-08-05 MED ORDER — BISACODYL 10 MG RE SUPP: 10.0000 mg | Freq: Every day | RECTAL | Status: DC | PRN

## 2022-08-05 MED ORDER — POTASSIUM CHLORIDE IN NACL 20-0.45 MEQ/L-% IV SOLN
INTRAVENOUS | Status: DC
  Filled 2022-08-05 (×3): qty 1000

## 2022-08-05 MED ORDER — TRANEXAMIC ACID-NACL 1000-0.7 MG/100ML-% IV SOLN
1000.0000 mg | INTRAVENOUS | Status: AC
  Administered 2022-08-05: 1000 mg via INTRAVENOUS
  Filled 2022-08-05: qty 100

## 2022-08-05 MED ORDER — KETOROLAC TROMETHAMINE 30 MG/ML IJ SOLN
INTRAMUSCULAR | Status: AC
  Filled 2022-08-05: qty 1

## 2022-08-05 MED ORDER — ONDANSETRON HCL 4 MG/2ML IJ SOLN
INTRAMUSCULAR | Status: AC
  Filled 2022-08-05: qty 2

## 2022-08-05 MED ORDER — HYDRALAZINE HCL 20 MG/ML IJ SOLN
INTRAMUSCULAR | Status: AC
  Filled 2022-08-05: qty 1

## 2022-08-05 MED ORDER — METOCLOPRAMIDE HCL 5 MG PO TABS: 5.0000 mg | ORAL_TABLET | Freq: Three times a day (TID) | ORAL | Status: DC | PRN

## 2022-08-05 MED ORDER — CEFAZOLIN SODIUM-DEXTROSE 2-4 GM/100ML-% IV SOLN
2.0000 g | INTRAVENOUS | Status: AC
  Administered 2022-08-05: 2 g via INTRAVENOUS
  Filled 2022-08-05: qty 100

## 2022-08-05 MED ORDER — PROPOFOL 1000 MG/100ML IV EMUL
INTRAVENOUS | Status: AC
  Filled 2022-08-05: qty 100

## 2022-08-05 MED ORDER — BUPIVACAINE HCL (PF) 0.25 % IJ SOLN
INTRAMUSCULAR | Status: AC
  Filled 2022-08-05: qty 30

## 2022-08-05 MED ORDER — MAGNESIUM CITRATE PO SOLN: 1.0000 | Freq: Once | ORAL | Status: DC | PRN

## 2022-08-05 MED ORDER — ORAL CARE MOUTH RINSE: 15.0000 mL | Freq: Once | OROMUCOSAL | Status: AC

## 2022-08-05 MED ORDER — DEXAMETHASONE SODIUM PHOSPHATE 10 MG/ML IJ SOLN
INTRAMUSCULAR | Status: AC
  Filled 2022-08-05: qty 1

## 2022-08-05 MED ORDER — POLYETHYL GLYCOL-PROPYL GLYCOL 0.4-0.3 % OP GEL: 1.0000 | Freq: Every day | OPHTHALMIC | Status: DC

## 2022-08-05 MED ORDER — PANTOPRAZOLE SODIUM 40 MG PO TBEC
40.0000 mg | DELAYED_RELEASE_TABLET | Freq: Every day | ORAL | Status: DC
  Administered 2022-08-06: 40 mg via ORAL
  Filled 2022-08-05: qty 1

## 2022-08-05 MED ORDER — METOCLOPRAMIDE HCL 5 MG/ML IJ SOLN: 5.0000 mg | Freq: Three times a day (TID) | INTRAMUSCULAR | Status: DC | PRN

## 2022-08-05 MED ORDER — POLYVINYL ALCOHOL 1.4 % OP SOLN: 1.0000 [drp] | Freq: Three times a day (TID) | OPHTHALMIC | Status: DC | PRN

## 2022-08-05 MED ORDER — EPHEDRINE SULFATE-NACL 50-0.9 MG/10ML-% IV SOSY
PREFILLED_SYRINGE | INTRAVENOUS | Status: DC | PRN
  Administered 2022-08-05 (×2): 5 mg via INTRAVENOUS

## 2022-08-05 MED ORDER — HYDRALAZINE HCL 20 MG/ML IJ SOLN
10.0000 mg | Freq: Once | INTRAMUSCULAR | Status: AC
  Administered 2022-08-05: 10 mg via INTRAVENOUS

## 2022-08-05 SURGICAL SUPPLY — 59 items
BAG COUNTER SPONGE SURGICOUNT (BAG) IMPLANT
BAG SPNG CNTER NS LX DISP (BAG) ×1
BIT DRILL 2.0X128 (BIT) ×1 IMPLANT
BLADE SAW SGTL 73X25 THK (BLADE) ×1 IMPLANT
CLSR STERI-STRIP ANTIMIC 1/2X4 (GAUZE/BANDAGES/DRESSINGS) ×2 IMPLANT
COVER SURGICAL LIGHT HANDLE (MISCELLANEOUS) ×1 IMPLANT
DRAPE INCISE IOBAN 66X45 STRL (DRAPES) ×1 IMPLANT
DRAPE ORTHO SPLIT 77X108 STRL (DRAPES) ×2
DRAPE POUCH INSTRU U-SHP 10X18 (DRAPES) ×1 IMPLANT
DRAPE SHEET LG 3/4 BI-LAMINATE (DRAPES) ×1 IMPLANT
DRAPE SURG 17X11 SM STRL (DRAPES) ×1 IMPLANT
DRAPE SURG ORHT 6 SPLT 77X108 (DRAPES) ×2 IMPLANT
DRAPE U-SHAPE 47X51 STRL (DRAPES) ×1 IMPLANT
DRSG MEPILEX POST OP 4X8 (GAUZE/BANDAGES/DRESSINGS) ×1 IMPLANT
DURAPREP 26ML APPLICATOR (WOUND CARE) ×2 IMPLANT
ELECT BLADE TIP CTD 4 INCH (ELECTRODE) ×1 IMPLANT
ELECT REM PT RETURN 15FT ADLT (MISCELLANEOUS) ×1 IMPLANT
ELIMINATOR HOLE APEX DEPUY (Hips) IMPLANT
FACESHIELD WRAPAROUND (MASK) ×2 IMPLANT
FACESHIELD WRAPAROUND OR TEAM (MASK) ×2 IMPLANT
GLOVE BIO SURGEON STRL SZ 6.5 (GLOVE) ×1 IMPLANT
GLOVE BIO SURGEON STRL SZ7.5 (GLOVE) ×1 IMPLANT
GLOVE BIOGEL PI IND STRL 7.0 (GLOVE) ×1 IMPLANT
GLOVE BIOGEL PI IND STRL 8 (GLOVE) ×1 IMPLANT
GOWN STRL SURGICAL XL XLNG (GOWN DISPOSABLE) ×2 IMPLANT
HEAD M SROM 36MM PLUS 1.5 (Hips) IMPLANT
HOOD PEEL AWAY T7 (MISCELLANEOUS) ×3 IMPLANT
K-WIRE TROCAR PT 2.0 150MM (WIRE) ×1
KIT BASIN OR (CUSTOM PROCEDURE TRAY) ×1 IMPLANT
KIT TURNOVER KIT A (KITS) IMPLANT
KWIRE TROCAR PT 2.0 150 (WIRE) ×1 IMPLANT
KWIRE TROCAR PT 2.0 150MM (WIRE) ×1 IMPLANT
LINER NEUTRAL 52X36MM PLUS 4 (Liner) IMPLANT
MANIFOLD NEPTUNE II (INSTRUMENTS) ×1 IMPLANT
NDL MA TROC 1/2 (NEEDLE) IMPLANT
NDL SAFETY ECLIP 18X1.5 (MISCELLANEOUS) ×2 IMPLANT
NEEDLE ANCHOR KEITH 2 7/8 STR (NEEDLE) ×1 IMPLANT
NEEDLE MA TROC 1/2 (NEEDLE) IMPLANT
NS IRRIG 1000ML POUR BTL (IV SOLUTION) ×1 IMPLANT
PACK TOTAL JOINT (CUSTOM PROCEDURE TRAY) ×1 IMPLANT
PIN SECTOR W/GRIP ACE CUP 52MM (Hips) IMPLANT
PROTECTOR NERVE ULNAR (MISCELLANEOUS) ×1 IMPLANT
SCREW 6.5MMX25MM (Screw) IMPLANT
SROM M HEAD 36MM PLUS 1.5 (Hips) ×1 IMPLANT
SUCTION FRAZIER HANDLE 12FR (TUBING) ×1
SUCTION TUBE FRAZIER 12FR DISP (TUBING) ×1 IMPLANT
SUT ETHIBOND NAB CT1 #1 30IN (SUTURE) ×3 IMPLANT
SUT VIC AB 0 CT1 36 (SUTURE) ×1 IMPLANT
SUT VIC AB 1 CT1 36 (SUTURE) ×2 IMPLANT
SUT VIC AB 2-0 CT1 27 (SUTURE) ×2
SUT VIC AB 2-0 CT1 TAPERPNT 27 (SUTURE) ×2 IMPLANT
SUT VIC AB 3-0 SH 27 (SUTURE) ×2
SUT VIC AB 3-0 SH 27X BRD (SUTURE) ×2 IMPLANT
SYR CONTROL 10ML LL (SYRINGE) ×2 IMPLANT
TAP DUOFIX SZ5 STD OFF (Hips) IMPLANT
TOWEL OR 17X26 10 PK STRL BLUE (TOWEL DISPOSABLE) ×1 IMPLANT
TRAY FOLEY MTR SLVR 16FR STAT (SET/KITS/TRAYS/PACK) ×1 IMPLANT
TUBE SUCTION HIGH CAP CLEAR NV (SUCTIONS) ×1 IMPLANT
WATER STERILE IRR 1000ML POUR (IV SOLUTION) ×2 IMPLANT

## 2022-08-05 NOTE — Op Note (Signed)
08/05/2022  11:28 AM  PATIENT:  [name]   MRN: [id number]  PRE-OPERATIVE DIAGNOSIS: Left hip primary localized osteoarthritis  POST-OPERATIVE DIAGNOSIS:  same  PROCEDURE:  Procedure(s): TOTAL HIP ARTHROPLASTY  PREOPERATIVE INDICATIONS:    CRHISTOPHER WALTNER is an 77 y.o. male who has a diagnosis of left hip primary localized osteoarthritis and elected for surgical management after failing conservative treatment.  The risks benefits and alternatives were discussed with the patient including but not limited to the risks of nonoperative treatment, versus surgical intervention including infection, bleeding, nerve injury, periprosthetic fracture, the need for revision surgery, dislocation, leg length discrepancy, blood clots, cardiopulmonary complications, morbidity, mortality, among others, and they were willing to proceed.     OPERATIVE REPORT     SURGEON:  Teryl Lucy, MD    ASSISTANT:  Janine Ores, PA-C, (Present throughout the entire procedure,  necessary for completion of procedure in a timely manner, assisting with retraction, instrumentation, and closure)     ANESTHESIA: Spinal  ESTIMATED BLOOD LOSS: 200 mL    COMPLICATIONS:  None.     UNIQUE ASPECTS OF THE CASE: The femoral head was fairly smooth, and not particularly collapsed.  I had excellent access to the acetabulum and match his anatomy exactly.  The cup was just slightly anteverted, with a little bit of beads showing superiorly.  He did not feel significantly short preoperatively, and I matched his leg lengths postoperatively.  The hip felt extremely stable, although he still had a little bit of shuck.  The soft tissue repair down to bone.  I debated about the size on the stem, it was difficult to reamed to a 6, and the 5 look like it filled it proximally, and had half of a tooth showing.  COMPONENTS:   Implant Name Type Inv. Item Serial No. Manufacturer Lot No. LRB No. Used Action  PIN SECTOR W/GRIP ACE CUP  - [id number] Hips PIN SECTOR W/GRIP ACE CUP  [employer] [id number] Left 1 Implanted  ELIMINATOR HOLE APEX DEPUY - [id number] Hips ELIMINATOR HOLE APEX DEPUY  [employer] [id number] Left 1 Implanted  SCREW 6.5MMX25MM - [id number] Screw SCREW 6.5MMX25MM  [employer]PU[employer] [id number] Left 1 Implanted  TAP DUOFIX SZ5 STD OFF - [id number] Hips TAP DUOFIX SZ5 STD OFF  [employer] [id number] Left 1 Implanted  LINER NEUTRAL 52X36MM PLUS 4 - [id number] Liner LINER NEUTRAL 52X36MM PLUS 4  DEPU[employer] [id number] Left 1 Implanted  SROM M HEAD PLUS 1.5 - [id number] Hips SROM M HEAD PLUS 1.5  [employer]PU[employer] [id number] Left 1 Implanted       PROCEDURE IN DETAIL:   The patient was met in the holding area and  identified.  The appropriate hip was identified and marked at the operative site.  The patient was then transported to the OR  and  placed under anesthesia.  At that point, the patient was  placed in the lateral decubitus position with the operative side up and  secured to the operating room table and all bony prominences padded.     The operative lower extremity was prepped from the iliac crest to the distal leg.  Sterile draping was performed.  Time out was performed prior to incision.      A routine posterolateral approach was utilized via sharp dissection  carried down to the subcutaneous tissue.  Gross bleeders were Bovie coagulated.  The iliotibial band was identified and incised along the length of the skin incision.  Self-retaining retractors were  inserted.  With the hip internally rotated, the short external rotators  were identified. The piriformis and capsule was tagged with FiberWire, and the hip capsule released in a T-type fashion.  The femoral neck was exposed, and I resected the femoral neck using the appropriate jig. This was performed at approximately a thumb's breadth above the lesser trochanter.    I then exposed the deep acetabulum, cleared out any  tissue including the ligamentum teres.  A wing retractor was placed.  After adequate visualization, I excised the labrum, and then sequentially reamed.  I placed the trial acetabulum, which seated nicely, and then impacted the real cup into place.  Appropriate version and inclination was confirmed clinically matching their bony anatomy, and also with the use of the jig.  I placed a cancellous screw to augment fixation.  A trial polyethylene liner was placed and the wing retractor removed.    I then prepared the proximal femur using the cookie-cutter, the lateralizing reamer, and then sequentially reamed and broached.  A trial broach, neck, and head was utilized, and I reduced the hip and it was found to have excellent stability with functional range of motion. The trial components were then removed, and the real polyethylene liner was placed.  I then impacted the real femoral prosthesis into place into the appropriate version, slightly anteverted to the normal anatomy, and I impacted the real head ball into place. The hip was then reduced and taken through functional range of motion and found to have excellent stability. Leg lengths were restored.  I then used a 2 mm drill bits to pass the FiberWire suture from the capsule and piriformis through the greater trochanter, and secured this. Excellent posterior capsular repair was achieved. I also closed the T in the capsule.  I then irrigated the hip copiously again with pulse lavage, and repaired the fascia with Vicryl, followed by Vicryl for the subcutaneous tissue, Monocryl for the skin, Steri-Strips and sterile gauze. The wounds were injected. The patient was then awakened and returned to PACU in stable and satisfactory condition. There were no complications.  Teryl Lucy, MD Orthopedic Surgeon [phone]   08/05/2022 11:28 AM

## 2022-08-05 NOTE — Anesthesia Procedure Notes (Signed)
Procedure Name: MAC Date/Time: 08/05/2022 9:53 AM  Performed by: Ludwig Lean, CRNAPre-anesthesia Checklist: Patient identified, Emergency Drugs available, Suction available and Patient being monitored Patient Re-evaluated:Patient Re-evaluated prior to induction Oxygen Delivery Method: Simple face mask Preoxygenation: Pre-oxygenation with 100% oxygen Placement Confirmation: positive ETCO2 and breath sounds checked- equal and bilateral

## 2022-08-05 NOTE — Discharge Instructions (Signed)
INSTRUCTIONS AFTER JOINT REPLACEMENT  ° °Remove items at home which could result in a fall. This includes throw rugs or furniture in walking pathways °ICE to the affected joint every three hours while awake for 30 minutes at a time, for at least the first 3-5 days, and then as needed for pain and swelling.  Continue to use ice for pain and swelling. You may notice swelling that will progress down to the foot and ankle.  This is normal after surgery.  Elevate your leg when you are not up walking on it.   °Continue to use the breathing machine you got in the hospital (incentive spirometer) which will help keep your temperature down.  It is common for your temperature to cycle up and down following surgery, especially at night when you are not up moving around and exerting yourself.  The breathing machine keeps your lungs expanded and your temperature down. ° ° °DIET:  As you were doing prior to hospitalization, we recommend a well-balanced diet. ° °DRESSING / WOUND CARE / SHOWERING ° °You may shower 3 days after surgery, but keep the wounds dry during showering.  You may use an occlusive plastic wrap (Press'n Seal for example), NO SOAKING/SUBMERGING IN THE BATHTUB.  If the bandage gets wet, change with a clean dry gauze.  If the incision gets wet, pat the wound dry with a clean towel. ° °ACTIVITY ° °Increase activity slowly as tolerated, but follow the weight bearing instructions below.   °No driving for 6 weeks or until further direction given by your physician.  You cannot drive while taking narcotics.  °No lifting or carrying greater than 10 lbs. until further directed by your surgeon. °Avoid periods of inactivity such as sitting longer than an hour when not asleep. This helps prevent blood clots.  °You may return to work once you are authorized by your doctor.  ° ° ° °WEIGHT BEARING  ° °Weight bearing as tolerated with assist device (walker, cane, etc) as directed, use it as long as suggested by your surgeon or  therapist, typically at least 4-6 weeks. ° ° °EXERCISES ° °Results after joint replacement surgery are often greatly improved when you follow the exercise, range of motion and muscle strengthening exercises prescribed by your doctor. Safety measures are also important to protect the joint from further injury. Any time any of these exercises cause you to have increased pain or swelling, decrease what you are doing until you are comfortable again and then slowly increase them. If you have problems or questions, call your caregiver or physical therapist for advice.  ° °Rehabilitation is important following a joint replacement. After just a few days of immobilization, the muscles of the leg can become weakened and shrink (atrophy).  These exercises are designed to build up the tone and strength of the thigh and leg muscles and to improve motion. Often times heat used for twenty to thirty minutes before working out will loosen up your tissues and help with improving the range of motion but do not use heat for the first two weeks following surgery (sometimes heat can increase post-operative swelling).  ° °These exercises can be done on a training (exercise) mat, on the floor, on a table or on a bed. Use whatever works the best and is most comfortable for you.    Use music or television while you are exercising so that the exercises are a pleasant break in your day. This will make your life better with the exercises acting   as a break in your routine that you can look forward to.   Perform all exercises about fifteen times, three times per day or as directed.  You should exercise both the operative leg and the other leg as well. ° °Exercises include: °  °Quad Sets - Tighten up the muscle on the front of the thigh (Quad) and hold for 5-10 seconds.   °Straight Leg Raises - With your knee straight (if you were given a brace, keep it on), lift the leg to 60 degrees, hold for 3 seconds, and slowly lower the leg.  Perform this  exercise against resistance later as your leg gets stronger.  °Leg Slides: Lying on your back, slowly slide your foot toward your buttocks, bending your knee up off the floor (only go as far as is comfortable). Then slowly slide your foot back down until your leg is flat on the floor again.  °Angel Wings: Lying on your back spread your legs to the side as far apart as you can without causing discomfort.  °Hamstring Strength:  Lying on your back, push your heel against the floor with your leg straight by tightening up the muscles of your buttocks.  Repeat, but this time bend your knee to a comfortable angle, and push your heel against the floor.  You may put a pillow under the heel to make it more comfortable if necessary.  ° °A rehabilitation program following joint replacement surgery can speed recovery and prevent re-injury in the future due to weakened muscles. Contact your doctor or a physical therapist for more information on knee rehabilitation.  ° ° °CONSTIPATION ° °Constipation is defined medically as fewer than three stools per week and severe constipation as less than one stool per week.  Even if you have a regular bowel pattern at home, your normal regimen is likely to be disrupted due to multiple reasons following surgery.  Combination of anesthesia, postoperative narcotics, change in appetite and fluid intake all can affect your bowels.  ° °YOU MUST use at least one of the following options; they are listed in order of increasing strength to get the job done.  They are all available over the counter, and you may need to use some, POSSIBLY even all of these options:   ° °Drink plenty of fluids (prune juice may be helpful) and high fiber foods °Colace 100 mg by mouth twice a day  °Senokot for constipation as directed and as needed Dulcolax (bisacodyl), take with full glass of water  °Miralax (polyethylene glycol) once or twice a day as needed. ° °If you have tried all these things and are unable to have a  bowel movement in the first 3-4 days after surgery call either your surgeon or your primary doctor.   ° °If you experience loose stools or diarrhea, hold the medications until you stool forms back up.  If your symptoms do not get better within 1 week or if they get worse, check with your doctor.  If you experience "the worst abdominal pain ever" or develop nausea or vomiting, please contact the office immediately for further recommendations for treatment. ° ° °ITCHING:  If you experience itching with your medications, try taking only a single pain pill, or even half a pain pill at a time.  You can also use Benadryl over the counter for itching or also to help with sleep.  ° °TED HOSE STOCKINGS:  Use stockings on both legs until for at least 2 weeks or as directed by   physician office. They may be removed at night for sleeping. ° °MEDICATIONS:  See your medication summary on the “After Visit Summary” that nursing will review with you.  You may have some home medications which will be placed on hold until you complete the course of blood thinner medication.  It is important for you to complete the blood thinner medication as prescribed. ° °PRECAUTIONS:  If you experience chest pain or shortness of breath - call 911 immediately for transfer to the hospital emergency department.  ° °If you develop a fever greater that 101 F, purulent drainage from wound, increased redness or drainage from wound, foul odor from the wound/dressing, or calf pain - CONTACT YOUR SURGEON.   °                                                °FOLLOW-UP APPOINTMENTS:  If you do not already have a post-op appointment, please call the office for an appointment to be seen by your surgeon.  Guidelines for how soon to be seen are listed in your “After Visit Summary”, but are typically between 1-4 weeks after surgery. ° °OTHER INSTRUCTIONS:  ° °POST-OPERATIVE OPIOID TAPER INSTRUCTIONS: °It is important to wean off of your opioid medication as soon as  possible. If you do not need pain medication after your surgery it is ok to stop day one. °Opioids include: °Codeine, Hydrocodone(Norco, Vicodin), Oxycodone(Percocet, oxycontin) and hydromorphone amongst others.  °Long term and even short term use of opiods can cause: °Increased pain response °Dependence °Constipation °Depression °Respiratory depression °And more.  °Withdrawal symptoms can include °Flu like symptoms °Nausea, vomiting °And more °Techniques to manage these symptoms °Hydrate well °Eat regular healthy meals °Stay active °Use relaxation techniques(deep breathing, meditating, yoga) °Do Not substitute Alcohol to help with tapering °If you have been on opioids for less than two weeks and do not have pain than it is ok to stop all together.  °Plan to wean off of opioids °This plan should start within one week post op of your joint replacement. °Maintain the same interval or time between taking each dose and first decrease the dose.  °Cut the total daily intake of opioids by one tablet each day °Next start to increase the time between doses. °The last dose that should be eliminated is the evening dose.  ° °MAKE SURE YOU:  °Understand these instructions.  °Get help right away if you are not doing well or get worse.  ° ° °Thank you for letting us be a part of your medical care team.  It is a privilege we respect greatly.  We hope these instructions will help you stay on track for a fast and full recovery!  ° ° °  °

## 2022-08-05 NOTE — Transfer of Care (Signed)
Immediate Anesthesia Transfer of Care Note  Patient: TILIAN CELEDON  Procedure(s) Performed: Procedure(s): TOTAL HIP ARTHROPLASTY (Left)  Patient Location: PACU  Anesthesia Type:MAC and Spinal  Level of Consciousness: Patient easily awoken, sedated, comfortable, cooperative, following commands, responds to stimulation.   Airway & Oxygen Therapy: Patient spontaneously breathing, ventilating well, oxygen via simple oxygen mask.  Post-op Assessment: Report given to PACU RN, vital signs reviewed and stable.   Post vital signs: Reviewed and stable.  Complications: No apparent anesthesia complications  Last Vitals:  Vitals Value Taken Time  BP 147/65 08/05/22 1149  Temp    Pulse 51 08/05/22 1154  Resp 17 08/05/22 1154  SpO2 100 % 08/05/22 1154  Vitals shown include unvalidated device data.  Last Pain:  Vitals:   08/05/22 0758  TempSrc: Oral  PainSc: 0-No pain      Patients Stated Pain Goal: 4 (08/05/22 0758)  Complications: No notable events documented.

## 2022-08-05 NOTE — Anesthesia Procedure Notes (Signed)
Spinal  Patient location during procedure: OR Start time: 08/05/2022 9:56 AM End time: 08/05/2022 9:58 AM Reason for block: surgical anesthesia Staffing Performed: anesthesiologist  Anesthesiologist: Linton Rump, MD Performed by: Linton Rump, MD Authorized by: Lowella Curb, MD   Preanesthetic Checklist Completed: patient identified, IV checked, site marked, risks and benefits discussed, surgical consent, monitors and equipment checked, pre-op evaluation and timeout performed Spinal Block Patient position: sitting Prep: DuraPrep Patient monitoring: blood pressure and continuous pulse ox Approach: midline Location: L2-3 Injection technique: single-shot Needle Needle type: Pencan  Needle gauge: 24 G Needle length: 9 cm Additional Notes Risks and benefits of neuraxial anesthesia including, but not limited to, infection, bleeding, local anesthetic toxicity, headache, hypotension, back pain, block failure, etc. were discussed with the patient. The patient expressed understanding and consented to the procedure. I confirmed that the patient has no bleeding disorders and is not taking blood thinners. I confirmed the patient's last platelet count with the nurse. Monitors were applied. A time-out was performed immediately prior to the procedure. Sterile technique was used throughout the whole procedure.   1 attempt(s)

## 2022-08-05 NOTE — Anesthesia Postprocedure Evaluation (Signed)
Anesthesia Post Note  Patient: [name]  Procedure(s) Performed: TOTAL HIP ARTHROPLASTY (Left: Hip)     Patient location during evaluation: PACU Anesthesia Type: Spinal Level of consciousness: awake and alert Pain management: pain level controlled Vital Signs Assessment: post-procedure vital signs reviewed and stable Respiratory status: spontaneous breathing, nonlabored ventilation and respiratory function stable Cardiovascular status: blood pressure returned to baseline and stable Postop Assessment: no apparent nausea or vomiting Anesthetic complications: no   No notable events documented.  Last Vitals:  Vitals:   08/05/22 1315 08/05/22 1320  BP: (!) 140/64 124/68  Pulse: (!) 53 (!) 57  Resp: 15 17  Temp:    SpO2: 100% 100%    Last Pain:  Vitals:   08/05/22 1315  TempSrc:   PainSc: 4                  Lowella Curb

## 2022-08-05 NOTE — Interval H&P Note (Signed)
History and Physical Interval Note:  08/05/2022 9:42 AM  [name]  has presented today for surgery, with the diagnosis of left hip DJD.  The various methods of treatment have been discussed with the patient and family. After consideration of risks, benefits and other options for treatment, the patient has consented to  Procedure(s): TOTAL HIP ARTHROPLASTY (Left) as a surgical intervention.  The patient's history has been reviewed, patient examined, no change in status, stable for surgery.  I have reviewed the patient's chart and labs.  Questions were answered to the patient's satisfaction.     Eulas Post

## 2022-08-05 NOTE — Evaluation (Signed)
Physical Therapy Evaluation Patient Details Name: [name] MRN: [id number] DOB: 04-19-1945 Today's Date: 08/05/2022  History of Present Illness  Pt is 77 yo male admitted 08/05/22 for L THA posterior approach (no precautions).  Pt with hx including but not limited to arthritis, CAD, HTN, syncope, back surgery, CABG, cardiac stents, L RCR, R tricep repair  Clinical Impression  Pt is s/p THA resulting in the deficits listed below (see PT Problem List). At baseline, pt independent.  He has DME and home support.  Today, pt with good pain control and eager to work with therapy.  He was min guard to min A for transfers and ambulated 60' with RW and min guard.  Pt expected to progress well with therapy.  Pt will benefit from acute skilled PT to increase their independence and safety with mobility to facilitate discharge.         Recommendations for follow up therapy are one component of a multi-disciplinary discharge planning process, led by the attending physician.  Recommendations may be updated based on patient status, additional functional criteria and insurance authorization.  Follow Up Recommendations       Assistance Recommended at Discharge Intermittent Supervision/Assistance  Patient can return home with the following  A little help with walking and/or transfers;A little help with bathing/dressing/bathroom;Assistance with cooking/housework;Help with stairs or ramp for entrance    Equipment Recommendations None recommended by PT  Recommendations for Other Services       Functional Status Assessment Patient has had a recent decline in their functional status and demonstrates the ability to make significant improvements in function in a reasonable and predictable amount of time.     Precautions / Restrictions Precautions Precautions: Fall Precaution Comments: Posterior - no precautions Restrictions Weight Bearing Restrictions: Yes LLE Weight Bearing: Weight bearing as tolerated       Mobility  Bed Mobility Overal bed mobility: Needs Assistance Bed Mobility: Supine to Sit     Supine to sit: Min assist     General bed mobility comments: Cues for L LE and light min A    Transfers Overall transfer level: Needs assistance Equipment used: Rolling walker (2 wheels) Transfers: Sit to/from Stand Sit to Stand: Min guard           General transfer comment: Cues for hand placement and L LE management    Ambulation/Gait Ambulation/Gait assistance: Min guard Gait Distance (Feet): 60 Feet Assistive device: Rolling walker (2 wheels) Gait Pattern/deviations: Step-to pattern, Decreased stride length, Decreased weight shift to left Gait velocity: decreased     General Gait Details: min cues for RW proximity and sequencing  Stairs            Wheelchair Mobility    Modified Rankin (Stroke Patients Only)       Balance Overall balance assessment: Needs assistance Sitting-balance support: No upper extremity supported Sitting balance-Leahy Scale: Good     Standing balance support: Bilateral upper extremity supported, No upper extremity supported Standing balance-Leahy Scale: Fair Standing balance comment: RW to ambulate but did stand statically without AD                             Pertinent Vitals/Pain Pain Assessment Pain Assessment: 0-10 Pain Score: 3  Pain Location: L hip Pain Descriptors / Indicators: Discomfort Pain Intervention(s): Limited activity within patient's tolerance, Monitored during session, Repositioned, Premedicated before session, Ice applied    Home Living Family/patient expects to be discharged to::  Private residence Living Arrangements: Spouse/significant other Available Help at Discharge: Family;Available 24 hours/day Type of Home: House Home Access: Stairs to enter Entrance Stairs-Rails: Right Entrance Stairs-Number of Steps: 3   Home Layout: One level Home Equipment: Cane - single Social worker (2 wheels)      Prior Function Prior Level of Function : Independent/Modified Independent             Mobility Comments: Cane in community or outside to ambulate; No AD in home       Hand Dominance        Extremity/Trunk Assessment   Upper Extremity Assessment Upper Extremity Assessment: Overall WFL for tasks assessed    Lower Extremity Assessment Lower Extremity Assessment: LLE deficits/detail;RLE deficits/detail RLE Deficits / Details: ROM WFL; MMT 5;5 LLE Deficits / Details: ROM WFL; MMT ankle 5/5, knee ext 3/5, hip 2/5    Cervical / Trunk Assessment Cervical / Trunk Assessment: Normal  Communication   Communication: HOH  Cognition Arousal/Alertness: Awake/alert Behavior During Therapy: WFL for tasks assessed/performed Overall Cognitive Status: Within Functional Limits for tasks assessed                                          General Comments      Exercises     Assessment/Plan    PT Assessment Patient needs continued PT services  PT Problem List Decreased strength;Pain;Decreased range of motion;Decreased activity tolerance;Decreased balance;Decreased mobility;Decreased knowledge of use of DME       PT Treatment Interventions DME instruction;Therapeutic exercise;Gait training;Balance training;Stair training;Functional mobility training;Therapeutic activities;Patient/family education;Modalities    PT Goals (Current goals can be found in the Care Plan section)  Acute Rehab PT Goals Patient Stated Goal: return home PT Goal Formulation: With patient/family Time For Goal Achievement: 08/19/22 Potential to Achieve Goals: Good    Frequency 7X/week     Co-evaluation               AM-PAC PT "6 Clicks" Mobility  Outcome Measure Help needed turning from your back to your side while in a flat bed without using bedrails?: A Little Help needed moving from lying on your back to sitting on the side of a flat bed without using  bedrails?: A Little Help needed moving to and from a bed to a chair (including a wheelchair)?: A Little Help needed standing up from a chair using your arms (e.g., wheelchair or bedside chair)?: A Little Help needed to walk in hospital room?: A Little Help needed climbing 3-5 steps with a railing? : A Little 6 Click Score: 18    End of Session Equipment Utilized During Treatment: Gait belt Activity Tolerance: Patient tolerated treatment well Patient left: with chair alarm set;in chair;with call bell/phone within reach Nurse Communication: Mobility status PT Visit Diagnosis: Other abnormalities of gait and mobility (R26.89);Muscle weakness (generalized) (M62.81)    Time: 1610-9604 PT Time Calculation (min) (ACUTE ONLY): 21 min   Charges:   PT Evaluation $PT Eval Low Complexity: 1 Low          Kizzie Cotten, PT Acute Rehab [facility] [phone]   [name] 08/05/2022, 4:26 PM

## 2022-08-06 ENCOUNTER — Other Ambulatory Visit: Payer: Self-pay

## 2022-08-06 ENCOUNTER — Encounter (HOSPITAL_COMMUNITY): Payer: Self-pay | Admitting: Orthopedic Surgery

## 2022-08-06 DIAGNOSIS — M1612 Unilateral primary osteoarthritis, left hip: Secondary | ICD-10-CM | POA: Diagnosis not present

## 2022-08-06 LAB — BASIC METABOLIC PANEL
Anion gap: 8 (ref 5–15)
BUN: 20 mg/dL (ref 8–23)
CO2: 23 mmol/L (ref 22–32)
Calcium: 8.4 mg/dL — ABNORMAL LOW (ref 8.9–10.3)
Chloride: 95 mmol/L — ABNORMAL LOW (ref 98–111)
Creatinine, Ser: 1.31 mg/dL — ABNORMAL HIGH (ref 0.61–1.24)
GFR, Estimated: 56 mL/min — ABNORMAL LOW (ref >60)
Glucose, Bld: 167 mg/dL — ABNORMAL HIGH (ref 70–99)
Potassium: 4.6 mmol/L (ref 3.5–5.1)
Sodium: 126 mmol/L — ABNORMAL LOW (ref 135–145)

## 2022-08-06 LAB — CBC
HCT: 27.7 % — ABNORMAL LOW (ref 39.0–52.0)
Hemoglobin: 9 g/dL — ABNORMAL LOW (ref 13.0–17.0)
MCH: 28.7 pg (ref 26.0–34.0)
MCHC: 32.5 g/dL (ref 30.0–36.0)
MCV: 88.2 fL (ref 80.0–100.0)
Platelets: 209 10*3/uL (ref 150–400)
RBC: 3.14 MIL/uL — ABNORMAL LOW (ref 4.22–5.81)
RDW: 15.1 % (ref 11.5–15.5)
WBC: 12.5 10*3/uL — ABNORMAL HIGH (ref 4.0–10.5)
nRBC: 0 % (ref 0.0–0.2)

## 2022-08-06 MED ORDER — ONDANSETRON HCL 4 MG PO TABS: 4.0000 mg | ORAL_TABLET | Freq: Three times a day (TID) | ORAL | 0 refills | Status: DC | PRN

## 2022-08-06 MED ORDER — TRAMADOL HCL 50 MG PO TABS: 50.0000 mg | ORAL_TABLET | ORAL | 0 refills | Status: AC | PRN

## 2022-08-06 MED ORDER — SENNA-DOCUSATE SODIUM 8.6-50 MG PO TABS: 2.0000 | ORAL_TABLET | Freq: Every day | ORAL | 1 refills | Status: DC

## 2022-08-06 MED ORDER — ASPIRIN 325 MG PO TBEC: 325.0000 mg | DELAYED_RELEASE_TABLET | Freq: Two times a day (BID) | ORAL | 0 refills | Status: DC

## 2022-08-06 NOTE — Discharge Summary (Signed)
Discharge Summary  Patient ID: [name] MRN: [id number] DOB/AGE: [birth year] 77 y.o.  Admit date: 08/05/2022 Discharge date: 08/06/2022  Admission Diagnoses:  S/P total left hip arthroplasty  Discharge Diagnoses:  Principal Problem:   S/P total left hip arthroplasty   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 male who was admitted 08/05/2022 with a diagnosis of S/P total left hip arthroplasty and went to the operating room on 08/05/2022 and underwent the above named procedures.    He was given perioperative antibiotics:  Anti-infectives (From admission, onward)    Start     Dose/Rate Route Frequency Ordered Stop   08/05/22 1600  ceFAZolin (ANCEF) IVPB 2g/100 mL premix        2 g 200 mL/hr over 30 Minutes Intravenous Every 6 hours 08/05/22 1335 08/05/22 2214   08/05/22 0745  ceFAZolin (ANCEF) IVPB  2g/100 mL premix        2 g 200 mL/hr over 30 Minutes Intravenous On call to O.R. 08/05/22 7829 08/05/22 0959     .  He was given sequential compression devices, early ambulation, and aspirin for DVT prophylaxis.  He benefited maximally from the hospital stay and there were no complications.    Recent vital signs:  Vitals:   08/06/22 0542 08/06/22 1011  BP: (!) 179/67 135/68  Pulse: 62 62  Resp: 15 17  Temp: 97.9 F (36.6 C) (!) 97.3 F (36.3 C)  SpO2: 100% 97%    Recent laboratory studies:  Lab Results  Component Value Date   HGB 9.0 (L) 08/06/2022   HGB 10.2 (L) 07/25/2022   HGB 10.8 (L) 12/25/2021   Lab Results  Component Value Date   WBC 12.5 (H) 08/06/2022   PLT 209 08/06/2022   Lab Results  Component Value Date   INR 1.0 09/11/2021   Lab Results  Component Value Date   NA 126 (L) 08/06/2022   K 4.6 08/06/2022   CL 95 (L) 08/06/2022   CO2 23 08/06/2022   BUN 20 08/06/2022   CREATININE 1.31 (H) 08/06/2022   GLUCOSE 167 (H) 08/06/2022    Discharge Medications:   Allergies as of 08/06/2022       Reactions  Codeine Nausea And Vomiting   Guaifenesin & Derivatives Nausea And Vomiting   Terbinafine And Related Other (See Comments)   Muscle pain   Iodinated Contrast Media Itching   Penicillins Rash   Tolerates Cefazolin without problems...[employer]        Medication List     TAKE these medications    acetaminophen 500 MG tablet Commonly known as: TYLENOL Take 1,000 mg by mouth every 6 (six) hours as needed for moderate pain.   ascorbic acid 500 MG tablet Commonly known as: VITAMIN C Take 500 mg by mouth daily.   aspirin EC 325 MG tablet Take 1 tablet (325 mg total) by mouth 2 (two) times daily. What changed:  medication strength how much to take when to take this additional instructions   clopidogrel 75 MG tablet Commonly known as: PLAVIX TAKE 1 TABLET BY MOUTH DAILY WITH BREAKFAST.   COQ10 PO Take 300 mg by mouth 2 (two) times daily.    ezetimibe 10 MG tablet Commonly known as: ZETIA TAKE 1/2 TABLET BY MOUTH EVERY EVENING   fish oil-omega-3 fatty acids 1000 MG capsule Take 1 g by mouth in the morning and at bedtime.   fluconazole 200 MG tablet Commonly known as: DIFLUCAN Take 200 mg by mouth once a week.   hydrALAZINE 25 MG tablet Commonly known as: APRESOLINE Take 1 tablet (25 mg total) by mouth 3 (three) times daily as needed (For SBP >140 mm Hg).   losartan 25 MG tablet Commonly known as: COZAAR TAKE 1/2 TABLET BY MOUTH EVERY EVENING   metoprolol succinate 25 MG 24 hr tablet Commonly known as: TOPROL-XL Take 0.5 tablets (12.5 mg total) by mouth every morning.   multivitamin tablet Take 1 tablet by mouth in the morning. Seniors   nitroGLYCERIN 0.4 MG SL tablet Commonly known as: NITROSTAT TAKE 1 TABLET EVERY 5 MINUTES AS NEEDED FOR CHEST PAIN.   nortriptyline 25 MG capsule Commonly known as: PAMELOR TAKE 1 CAPSULE BY MOUTH AT BEDTIME. What changed:  when to take this reasons to take this   ondansetron 4 MG tablet Commonly known as: Zofran Take 1 tablet (4 mg total) by mouth every 8 (eight) hours as needed for nausea or vomiting.   pantoprazole 40 MG tablet Commonly known as: Protonix Take 1 tablet (40 mg total) by mouth daily.   Polyethyl Glycol-Propyl Glycol 0.4-0.3 % Gel ophthalmic gel Commonly known as: SYSTANE Place 1 application into both eyes at bedtime.   PreserVision AREDS 2 Caps Take 1 capsule by mouth 2 (two) times daily.   pyridoxine 100 MG tablet Commonly known as: B-6 Take 100 mg by mouth every evening.   rosuvastatin 20 MG tablet Commonly known as: CRESTOR TAKE ONE TABLET BY MOUTH EVERYDAY AT BEDTIME   sennosides-docusate sodium 8.6-50 MG tablet Commonly known as: SENOKOT-S Take 2 tablets by mouth daily.   Systane Balance 0.6 % Soln Generic drug: Propylene Glycol Place 1 drop into both eyes 3 (three) times daily as needed (dry/irritated eyes.).   traMADol 50 MG  tablet Commonly known as: Ultram Take 1 tablet (50 mg total) by mouth every 4 (four) hours as needed for up to 5 days for moderate pain. What changed: when to take this   TUMS ULTRA PO Take 2 tablets by mouth daily as needed (acid reflux/indigestion.).   Vitamin B-12 5000 MCG Tbdp Take 5,000 mcg by mouth every evening.        Diagnostic Studies: DG Hip Port Unilat With Pelvis 1V Left  Result  Date: 08/05/2022 CLINICAL DATA:  Status post left hip arthroplasty. EXAM: DG HIP (WITH OR WITHOUT PELVIS) 1V PORT LEFT COMPARISON:  None Available. FINDINGS: Left hip arthroplasty in expected alignment. No periprosthetic lucency or fracture. Recent postsurgical change includes air and edema in the soft tissues. IMPRESSION: Left hip arthroplasty without immediate postoperative complication. Electronically Signed   By: Narda Rutherford M.D.   On: 08/05/2022 12:46    Disposition: Discharge disposition: 01-Home or Self Care          Follow-up Information     Teryl Lucy, MD. Go on 08/18/2022.   Specialty: Orthopedic Surgery Why: Your appointment is scheduled for 3:45 Contact information: [address] [place] [address] [phone]         [facility], [place]. Go on 08/11/2022.   Why: Your outpatient phyiscal therapy appointment has been scheduled. The office will call you with time. Contact information: [facility] [address] [place] [address] [phone]                  Signed: Annita Brod 08/06/2022, 11:41 AM

## 2022-08-06 NOTE — Progress Notes (Signed)
Patient discharged to home w/ family. Given all belongings, instructions. Verbalized understanding of instructions. Escorted to pov via w/c. 

## 2022-08-06 NOTE — Progress Notes (Addendum)
Physical Therapy Treatment Patient Details Name: [name] MRN: [id number] DOB: [birth year] Today's Date: 08/06/2022   History of Present Illness Pt is 77 yo male admitted 08/05/22 for L THA posterior approach (no precautions).  Pt with hx including but not limited to arthritis, CAD, HTN, syncope, back surgery, CABG, cardiac stents, L RCR, R tricep repair    PT Comments    Pt is POD # 1 and is progressing well.  He is transferring at supervision level, ambulated 100' with RW, and performed stairs similar to home set up.  Pt with good pain control and motivation.  Pt demonstrates safe gait & transfers in order to return home from PT perspective once discharged by MD.  While in hospital, will continue to benefit from PT for skilled therapy to advance mobility and exercises.  Did clarify no posterior precautions with Janine Ores, PA.    Recommendations for follow up therapy are one component of a multi-disciplinary discharge planning process, led by the attending physician.  Recommendations may be updated based on patient status, additional functional criteria and insurance authorization.  Follow Up Recommendations       Assistance Recommended at Discharge Intermittent Supervision/Assistance  Patient can return home with the following A little help with walking and/or transfers;A little help with bathing/dressing/bathroom;Assistance with cooking/housework;Help with stairs or ramp for entrance   Equipment Recommendations  None recommended by PT    Recommendations for Other Services       Precautions / Restrictions Precautions Precautions: Fall Precaution Comments: Posterior - no precautions Restrictions LLE Weight Bearing: Weight bearing as tolerated     Mobility  Bed Mobility Overal bed mobility: Needs Assistance Bed Mobility: Supine to Sit     Supine to sit: Supervision          Transfers Overall transfer level: Needs assistance Equipment used: Rolling walker (2  wheels) Transfers: Sit to/from Stand Sit to Stand: Supervision           General transfer comment: Good recall of hand placement and L LE managment    Ambulation/Gait Ambulation/Gait assistance: Supervision Gait Distance (Feet): 100 Feet Assistive device: Rolling walker (2 wheels) Gait Pattern/deviations: Step-through pattern, Decreased weight shift to left       General Gait Details: Slight decrease in stance time on L but overall steady gait at functional speed for POD #1; good RW proximity and educated on no pivots with turns   Stairs Stairs: Yes Stairs assistance: Min guard Stair Management: One rail Right, Step to pattern, Forwards Number of Stairs: 5 General stair comments: Cued for sequencing initially   Wheelchair Mobility    Modified Rankin (Stroke Patients Only)       Balance Overall balance assessment: Needs assistance Sitting-balance support: No upper extremity supported Sitting balance-Leahy Scale: Good     Standing balance support: Bilateral upper extremity supported, No upper extremity supported Standing balance-Leahy Scale: Fair Standing balance comment: RW to ambulate but did stand statically without AD for ADLs                            Cognition Arousal/Alertness: Awake/alert Behavior During Therapy: WFL for tasks assessed/performed Overall Cognitive Status: Within Functional Limits for tasks assessed                                          Exercises Total Joint Exercises  Ankle Circles/Pumps: AROM, Both, 10 reps, Supine Quad Sets: AROM, Both, 10 reps, Supine Heel Slides: AAROM, Left, 10 reps, Supine Hip ABduction/ADduction: AAROM, Left, 10 reps, Supine Long Arc Quad: AROM, Left, 10 reps, Seated Other Exercises Other Exercises: Educated on AAROM techniques and only to tolerance    General Comments  Educated on safe ice use, no pivots, car transfers. Also, encouraged walking every 1-2 hours during day.  Educated on HEP with focus on mobility the first weeks. Discussed doing exercises within pain control and if pain increasing could decreased ROM, reps, and stop exercises as needed. Encouraged to perform ankle pumps frequently for blood flow        Pertinent Vitals/Pain Pain Assessment Pain Assessment: 0-10 Pain Score: 5  ((5/10 movement; 0/10 rest)) Pain Location: L hip Pain Descriptors / Indicators: Sore Pain Intervention(s): Limited activity within patient's tolerance, Monitored during session, Premedicated before session, Repositioned, Ice applied    Home Living                          Prior Function            PT Goals (current goals can now be found in the care plan section) Progress towards PT goals: Progressing toward goals    Frequency    7X/week      PT Plan Current plan remains appropriate    Co-evaluation              AM-PAC PT "6 Clicks" Mobility   Outcome Measure  Help needed turning from your back to your side while in a flat bed without using bedrails?: None Help needed moving from lying on your back to sitting on the side of a flat bed without using bedrails?: None Help needed moving to and from a bed to a chair (including a wheelchair)?: A Little Help needed standing up from a chair using your arms (e.g., wheelchair or bedside chair)?: A Little Help needed to walk in hospital room?: A Little Help needed climbing 3-5 steps with a railing? : A Little 6 Click Score: 20    End of Session Equipment Utilized During Treatment: Gait belt Activity Tolerance: Patient tolerated treatment well Patient left: with chair alarm set;in chair;with call bell/phone within reach Nurse Communication: Mobility status PT Visit Diagnosis: Other abnormalities of gait and mobility (R26.89);Muscle weakness (generalized) (M62.81)     Time: 1610-9604 PT Time Calculation (min) (ACUTE ONLY): 23 min  Charges:  $Gait Training: 8-22 mins $Therapeutic  Exercise: 8-22 mins                     Anise Salvo, PT Acute Rehab [facility] [phone]    Rayetta Humphrey 08/06/2022, 11:34 AM

## 2022-08-06 NOTE — Progress Notes (Signed)
Subjective: 1 Day Post-Op s/p Procedure(s): TOTAL HIP ARTHROPLASTY   Patient is alert, oriented. Reports pain as so far well controlled with tramadol. Has not yet voided on own, foley just pulled. No DM yet. Had a little lightheadedness while walking with PT yesterday, but quickly resolved, none at rest. Denies chest pain, SOB, Calf pain. No nausea/vomiting. No other complaints.   Objective:  PE: VITALS:   Vitals:   08/05/22 1603 08/05/22 2148 08/06/22 0148 08/06/22 0542  BP: 125/85 (!) 162/72 (!) 176/61 (!) 179/67  Pulse: 69 63 61 62  Resp: 18 16 15 15   Temp: 98.5 F (36.9 C) (!) 97.5 F (36.4 C) 97.6 F (36.4 C) 97.9 F (36.6 C)  TempSrc:  Oral Oral Oral  SpO2: 100% 100% 99% 100%  Weight:      Height:        ABD soft Sensation intact distally Intact pulses distally Dorsiflexion/Plantar flexion intact Incision: dressing C/D/I  LABS  Results for orders placed or performed during the hospital encounter of 08/05/22 (from the past 24 hour(s))  CBC     Status: Abnormal   Collection Time: 08/06/22  3:32 AM  Result Value Ref Range   WBC 12.5 (H) 4.0 - 10.5 K/uL   RBC 3.14 (L) 4.22 - 5.81 MIL/uL   Hemoglobin 9.0 (L) 13.0 - 17.0 g/dL   HCT 40.9 (L) 81.1 - 91.4 %   MCV 88.2 80.0 - 100.0 fL   MCH 28.7 26.0 - 34.0 pg   MCHC 32.5 30.0 - 36.0 g/dL   RDW 78.2 95.6 - 21.3 %   Platelets 209 150 - 400 K/uL   nRBC 0.0 0.0 - 0.2 %  Basic metabolic panel     Status: Abnormal   Collection Time: 08/06/22  3:32 AM  Result Value Ref Range   Sodium 126 (L) 135 - 145 mmol/L   Potassium 4.6 3.5 - 5.1 mmol/L   Chloride 95 (L) 98 - 111 mmol/L   CO2 23 22 - 32 mmol/L   Glucose, Bld 167 (H) 70 - 99 mg/dL   BUN 20 8 - 23 mg/dL   Creatinine, Ser 0.86 (H) 0.61 - 1.24 mg/dL   Calcium 8.4 (L) 8.9 - 10.3 mg/dL   GFR, Estimated 56 (L) >60 mL/min   Anion gap 8 5 - 15    DG Hip Port Unilat With Pelvis 1V Left  Result Date: 08/05/2022 CLINICAL DATA:  Status post left hip arthroplasty.  EXAM: DG HIP (WITH OR WITHOUT PELVIS) 1V PORT LEFT COMPARISON:  None Available. FINDINGS: Left hip arthroplasty in expected alignment. No periprosthetic lucency or fracture. Recent postsurgical change includes air and edema in the soft tissues. IMPRESSION: Left hip arthroplasty without immediate postoperative complication. Electronically Signed   By: Narda Rutherford M.D.   On: 08/05/2022 12:46    Assessment/Plan: Principal Problem:   S/P total left hip arthroplasty  1 Day Post-Op s/p Procedure(s): TOTAL HIP ARTHROPLASTY - patient anemic prior to surgery baseline Hbg 10.2 down to 9 today. So far not particularly symptomatic.  - BP's have remained a little high overnight, will see how he does with AM metoprolol, prn hydralaxine, d/c of fluids  Weightbearing: WBAT LLE Insicional and dressing care: Reinforce dressings as needed VTE prophylaxis: Aspirin 325mg  BID x 30 days Pain control: po tylenol, tramadol, IV dilaudid for breakthrough pain Follow - up plan: 2 weeks Dispo: pending progress with PT today  Contact information:   Janine Ores, PA-C Weekdays 8-5  After hours  and holidays please check [email or web address] for group call information for [facility]  Armida Sans 08/06/2022, 6:47 AM

## 2022-08-06 NOTE — TOC Transition Note (Signed)
Transition of Care [facility]) - CM/SW Discharge Note   Patient Details  Name: [name] MRN: [id number] Date of Birth: March 29, 1945  Transition of Care [facility]) CM/SW Contact:  Amada Jupiter, LCSW Phone Number: 08/06/2022, 10:12 AM   Clinical Narrative:     Met with pt and confirming he has needed DME at home.  OPPT already arranged with [facility].  No TOC needs.  Final next level of care: OP Rehab Barriers to Discharge: No Barriers Identified   Patient Goals and CMS Choice      Discharge Placement                         Discharge Plan and Services Additional resources added to the After Visit Summary for                  DME Arranged: N/A DME Agency: NA                  Social Determinants of Health (SDOH) Interventions SDOH Screenings   Food Insecurity: No Food Insecurity (08/05/2022)  Housing: Low Risk  (08/05/2022)  Transportation Needs: No Transportation Needs (08/05/2022)  Utilities: Not At Risk (08/05/2022)  Depression (PHQ2-9): Low Risk  (05/02/2022)  Recent Concern: Depression (PHQ2-9) - Medium Risk (03/06/2022)  Tobacco Use: Low Risk  (08/05/2022)     Readmission Risk Interventions     No data to display

## 2022-08-11 DIAGNOSIS — R262 Difficulty in walking, not elsewhere classified: Secondary | ICD-10-CM | POA: Diagnosis not present

## 2022-08-11 DIAGNOSIS — M1612 Unilateral primary osteoarthritis, left hip: Secondary | ICD-10-CM | POA: Diagnosis not present

## 2022-08-11 DIAGNOSIS — M25652 Stiffness of left hip, not elsewhere classified: Secondary | ICD-10-CM | POA: Diagnosis not present

## 2022-08-11 DIAGNOSIS — M6281 Muscle weakness (generalized): Secondary | ICD-10-CM | POA: Diagnosis not present

## 2022-08-14 DIAGNOSIS — R262 Difficulty in walking, not elsewhere classified: Secondary | ICD-10-CM | POA: Diagnosis not present

## 2022-08-14 DIAGNOSIS — M6281 Muscle weakness (generalized): Secondary | ICD-10-CM | POA: Diagnosis not present

## 2022-08-14 DIAGNOSIS — M25652 Stiffness of left hip, not elsewhere classified: Secondary | ICD-10-CM | POA: Diagnosis not present

## 2022-08-14 DIAGNOSIS — M1612 Unilateral primary osteoarthritis, left hip: Secondary | ICD-10-CM | POA: Diagnosis not present

## 2022-08-15 DIAGNOSIS — M1612 Unilateral primary osteoarthritis, left hip: Secondary | ICD-10-CM | POA: Diagnosis not present

## 2022-08-17 DIAGNOSIS — R55 Syncope and collapse: Secondary | ICD-10-CM | POA: Diagnosis not present

## 2022-08-17 DIAGNOSIS — Z95818 Presence of other cardiac implants and grafts: Secondary | ICD-10-CM | POA: Diagnosis not present

## 2022-08-17 DIAGNOSIS — Z4509 Encounter for adjustment and management of other cardiac device: Secondary | ICD-10-CM | POA: Diagnosis not present

## 2022-08-19 DIAGNOSIS — R262 Difficulty in walking, not elsewhere classified: Secondary | ICD-10-CM | POA: Diagnosis not present

## 2022-08-19 DIAGNOSIS — M25652 Stiffness of left hip, not elsewhere classified: Secondary | ICD-10-CM | POA: Diagnosis not present

## 2022-08-19 DIAGNOSIS — M1612 Unilateral primary osteoarthritis, left hip: Secondary | ICD-10-CM | POA: Diagnosis not present

## 2022-08-19 DIAGNOSIS — M6281 Muscle weakness (generalized): Secondary | ICD-10-CM | POA: Diagnosis not present

## 2022-08-21 DIAGNOSIS — M25652 Stiffness of left hip, not elsewhere classified: Secondary | ICD-10-CM | POA: Diagnosis not present

## 2022-08-21 DIAGNOSIS — R262 Difficulty in walking, not elsewhere classified: Secondary | ICD-10-CM | POA: Diagnosis not present

## 2022-08-21 DIAGNOSIS — M1612 Unilateral primary osteoarthritis, left hip: Secondary | ICD-10-CM | POA: Diagnosis not present

## 2022-08-21 DIAGNOSIS — M6281 Muscle weakness (generalized): Secondary | ICD-10-CM | POA: Diagnosis not present

## 2022-08-26 DIAGNOSIS — M6281 Muscle weakness (generalized): Secondary | ICD-10-CM | POA: Diagnosis not present

## 2022-08-26 DIAGNOSIS — M1612 Unilateral primary osteoarthritis, left hip: Secondary | ICD-10-CM | POA: Diagnosis not present

## 2022-08-26 DIAGNOSIS — R262 Difficulty in walking, not elsewhere classified: Secondary | ICD-10-CM | POA: Diagnosis not present

## 2022-08-26 DIAGNOSIS — M25652 Stiffness of left hip, not elsewhere classified: Secondary | ICD-10-CM | POA: Diagnosis not present

## 2022-08-28 ENCOUNTER — Encounter (INDEPENDENT_AMBULATORY_CARE_PROVIDER_SITE_OTHER): Payer: PPO | Admitting: Ophthalmology

## 2022-08-28 DIAGNOSIS — H35033 Hypertensive retinopathy, bilateral: Secondary | ICD-10-CM | POA: Diagnosis not present

## 2022-08-28 DIAGNOSIS — H34832 Tributary (branch) retinal vein occlusion, left eye, with macular edema: Secondary | ICD-10-CM

## 2022-08-28 DIAGNOSIS — I10 Essential (primary) hypertension: Secondary | ICD-10-CM | POA: Diagnosis not present

## 2022-08-28 DIAGNOSIS — H43813 Vitreous degeneration, bilateral: Secondary | ICD-10-CM | POA: Diagnosis not present

## 2022-08-29 DIAGNOSIS — M25652 Stiffness of left hip, not elsewhere classified: Secondary | ICD-10-CM | POA: Diagnosis not present

## 2022-08-29 DIAGNOSIS — R262 Difficulty in walking, not elsewhere classified: Secondary | ICD-10-CM | POA: Diagnosis not present

## 2022-08-29 DIAGNOSIS — M1612 Unilateral primary osteoarthritis, left hip: Secondary | ICD-10-CM | POA: Diagnosis not present

## 2022-08-29 DIAGNOSIS — M6281 Muscle weakness (generalized): Secondary | ICD-10-CM | POA: Diagnosis not present

## 2022-09-02 DIAGNOSIS — M1612 Unilateral primary osteoarthritis, left hip: Secondary | ICD-10-CM | POA: Diagnosis not present

## 2022-09-02 DIAGNOSIS — M6281 Muscle weakness (generalized): Secondary | ICD-10-CM | POA: Diagnosis not present

## 2022-09-02 DIAGNOSIS — M25652 Stiffness of left hip, not elsewhere classified: Secondary | ICD-10-CM | POA: Diagnosis not present

## 2022-09-02 DIAGNOSIS — R262 Difficulty in walking, not elsewhere classified: Secondary | ICD-10-CM | POA: Diagnosis not present

## 2022-09-04 DIAGNOSIS — M6281 Muscle weakness (generalized): Secondary | ICD-10-CM | POA: Diagnosis not present

## 2022-09-04 DIAGNOSIS — R262 Difficulty in walking, not elsewhere classified: Secondary | ICD-10-CM | POA: Diagnosis not present

## 2022-09-04 DIAGNOSIS — M25652 Stiffness of left hip, not elsewhere classified: Secondary | ICD-10-CM | POA: Diagnosis not present

## 2022-09-04 DIAGNOSIS — M1612 Unilateral primary osteoarthritis, left hip: Secondary | ICD-10-CM | POA: Diagnosis not present

## 2022-09-08 DIAGNOSIS — M25652 Stiffness of left hip, not elsewhere classified: Secondary | ICD-10-CM | POA: Diagnosis not present

## 2022-09-08 DIAGNOSIS — M1612 Unilateral primary osteoarthritis, left hip: Secondary | ICD-10-CM | POA: Diagnosis not present

## 2022-09-08 DIAGNOSIS — R262 Difficulty in walking, not elsewhere classified: Secondary | ICD-10-CM | POA: Diagnosis not present

## 2022-09-08 DIAGNOSIS — M6281 Muscle weakness (generalized): Secondary | ICD-10-CM | POA: Diagnosis not present

## 2022-09-10 DIAGNOSIS — M25652 Stiffness of left hip, not elsewhere classified: Secondary | ICD-10-CM | POA: Diagnosis not present

## 2022-09-10 DIAGNOSIS — M1612 Unilateral primary osteoarthritis, left hip: Secondary | ICD-10-CM | POA: Diagnosis not present

## 2022-09-10 DIAGNOSIS — R262 Difficulty in walking, not elsewhere classified: Secondary | ICD-10-CM | POA: Diagnosis not present

## 2022-09-10 DIAGNOSIS — M6281 Muscle weakness (generalized): Secondary | ICD-10-CM | POA: Diagnosis not present

## 2022-09-12 ENCOUNTER — Other Ambulatory Visit: Payer: Self-pay | Admitting: Cardiology

## 2022-09-12 ENCOUNTER — Other Ambulatory Visit: Payer: Self-pay | Admitting: Family Medicine

## 2022-09-15 DIAGNOSIS — R262 Difficulty in walking, not elsewhere classified: Secondary | ICD-10-CM | POA: Diagnosis not present

## 2022-09-15 DIAGNOSIS — M6281 Muscle weakness (generalized): Secondary | ICD-10-CM | POA: Diagnosis not present

## 2022-09-15 DIAGNOSIS — M25652 Stiffness of left hip, not elsewhere classified: Secondary | ICD-10-CM | POA: Diagnosis not present

## 2022-09-15 DIAGNOSIS — M1612 Unilateral primary osteoarthritis, left hip: Secondary | ICD-10-CM | POA: Diagnosis not present

## 2022-09-15 NOTE — Telephone Encounter (Signed)
Last OV 02/28/22 Next OV not scheduled  Last refill 10/15/21 Qty #90/1

## 2022-09-17 DIAGNOSIS — R55 Syncope and collapse: Secondary | ICD-10-CM | POA: Diagnosis not present

## 2022-09-17 DIAGNOSIS — Z95818 Presence of other cardiac implants and grafts: Secondary | ICD-10-CM | POA: Diagnosis not present

## 2022-09-17 DIAGNOSIS — Z4509 Encounter for adjustment and management of other cardiac device: Secondary | ICD-10-CM | POA: Diagnosis not present

## 2022-09-22 DIAGNOSIS — M6281 Muscle weakness (generalized): Secondary | ICD-10-CM | POA: Diagnosis not present

## 2022-09-22 DIAGNOSIS — R262 Difficulty in walking, not elsewhere classified: Secondary | ICD-10-CM | POA: Diagnosis not present

## 2022-09-22 DIAGNOSIS — M25652 Stiffness of left hip, not elsewhere classified: Secondary | ICD-10-CM | POA: Diagnosis not present

## 2022-09-22 DIAGNOSIS — M1612 Unilateral primary osteoarthritis, left hip: Secondary | ICD-10-CM | POA: Diagnosis not present

## 2022-09-25 ENCOUNTER — Encounter (INDEPENDENT_AMBULATORY_CARE_PROVIDER_SITE_OTHER): Payer: PPO | Admitting: Ophthalmology

## 2022-09-25 DIAGNOSIS — H35033 Hypertensive retinopathy, bilateral: Secondary | ICD-10-CM | POA: Diagnosis not present

## 2022-09-25 DIAGNOSIS — I10 Essential (primary) hypertension: Secondary | ICD-10-CM | POA: Diagnosis not present

## 2022-09-25 DIAGNOSIS — H43813 Vitreous degeneration, bilateral: Secondary | ICD-10-CM

## 2022-09-25 DIAGNOSIS — H34832 Tributary (branch) retinal vein occlusion, left eye, with macular edema: Secondary | ICD-10-CM | POA: Diagnosis not present

## 2022-09-29 DIAGNOSIS — M6281 Muscle weakness (generalized): Secondary | ICD-10-CM | POA: Diagnosis not present

## 2022-09-29 DIAGNOSIS — R262 Difficulty in walking, not elsewhere classified: Secondary | ICD-10-CM | POA: Diagnosis not present

## 2022-09-29 DIAGNOSIS — M1612 Unilateral primary osteoarthritis, left hip: Secondary | ICD-10-CM | POA: Diagnosis not present

## 2022-09-29 DIAGNOSIS — M25652 Stiffness of left hip, not elsewhere classified: Secondary | ICD-10-CM | POA: Diagnosis not present

## 2022-10-18 DIAGNOSIS — Z4509 Encounter for adjustment and management of other cardiac device: Secondary | ICD-10-CM | POA: Diagnosis not present

## 2022-10-18 DIAGNOSIS — Z95818 Presence of other cardiac implants and grafts: Secondary | ICD-10-CM | POA: Diagnosis not present

## 2022-10-18 DIAGNOSIS — R55 Syncope and collapse: Secondary | ICD-10-CM | POA: Diagnosis not present

## 2022-10-20 DIAGNOSIS — M1612 Unilateral primary osteoarthritis, left hip: Secondary | ICD-10-CM | POA: Diagnosis not present

## 2022-10-20 DIAGNOSIS — M1611 Unilateral primary osteoarthritis, right hip: Secondary | ICD-10-CM | POA: Diagnosis not present

## 2022-10-23 ENCOUNTER — Encounter (INDEPENDENT_AMBULATORY_CARE_PROVIDER_SITE_OTHER): Payer: PPO | Admitting: Ophthalmology

## 2022-10-23 DIAGNOSIS — H35033 Hypertensive retinopathy, bilateral: Secondary | ICD-10-CM

## 2022-10-23 DIAGNOSIS — H43813 Vitreous degeneration, bilateral: Secondary | ICD-10-CM

## 2022-10-23 DIAGNOSIS — H34832 Tributary (branch) retinal vein occlusion, left eye, with macular edema: Secondary | ICD-10-CM

## 2022-10-23 DIAGNOSIS — I10 Essential (primary) hypertension: Secondary | ICD-10-CM | POA: Diagnosis not present

## 2022-11-12 NOTE — Telephone Encounter (Signed)
This encounter was created in error - please disregard.

## 2022-11-13 ENCOUNTER — Ambulatory Visit: Payer: PPO | Admitting: Cardiology

## 2022-11-13 ENCOUNTER — Encounter: Payer: Self-pay | Admitting: Cardiology

## 2022-11-13 VITALS — BP 138/70 | HR 55 | Resp 17 | Ht 67.0 in | Wt 188.0 lb

## 2022-11-13 DIAGNOSIS — I10 Essential (primary) hypertension: Secondary | ICD-10-CM

## 2022-11-13 DIAGNOSIS — I25118 Atherosclerotic heart disease of native coronary artery with other forms of angina pectoris: Secondary | ICD-10-CM | POA: Diagnosis not present

## 2022-11-13 DIAGNOSIS — N1831 Chronic kidney disease, stage 3a: Secondary | ICD-10-CM

## 2022-11-13 DIAGNOSIS — R0609 Other forms of dyspnea: Secondary | ICD-10-CM

## 2022-11-13 DIAGNOSIS — I5032 Chronic diastolic (congestive) heart failure: Secondary | ICD-10-CM

## 2022-11-13 DIAGNOSIS — I129 Hypertensive chronic kidney disease with stage 1 through stage 4 chronic kidney disease, or unspecified chronic kidney disease: Secondary | ICD-10-CM | POA: Diagnosis not present

## 2022-11-13 MED ORDER — NITROGLYCERIN 0.4 MG SL SUBL: SUBLINGUAL_TABLET | SUBLINGUAL | 3 refills | Status: DC

## 2022-11-13 NOTE — Progress Notes (Addendum)
Primary Physician/Referring:  Gaspar Garbe, MD  Patient ID: [name], male    DOB: [birth year], 77 y.o.   MRN: [id number]  Chief Complaint  Patient presents with   Coronary Artery Disease   Hypertension   Hyperlipidemia   Follow-up    6 month    HPI:    [name]  is a 77 y.o.  Caucasian male patient with coronary artery SP CABG in 1996, coronary stent to ostial free radial Y-graft to D1-OM1, chronic stable angina, mild aortic stenosis, hypertension, mixed hyperlipidemia, GERD, chronic back pain, recurrent syncope and dizziness, underwent loop recorder implantation on 04/09/2021.  Due to chest pain and worsening dyspnea, underwent cardiac catheterization and angioplasty to distal LM and ostial and proximal CX on 12/24/2021, has a patent LIMA to LAD and free radial graft with the ostial stent to D1.  He is accompanied by his wife, states that his symptoms of dyspnea and angina has recurred and he feels similar to his symptoms that were ongoing prior to his stenting of the circumflex coronary artery.  He has also had episodes of orthopnea and PND.  He has been using nitroglycerin fairly frequently.  No rest pain.  States that even doing minimal activity he gets markedly short of breath and chest feels tight which is easily relieved with nitroglycerin.  He continues to have occasional episodes of dizziness.  He has not had any syncope.  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 Negative for leg swelling and syncope.  Neurological:  Positive for dizziness.  SP remote CABG in 1996, Objective  Blood pressure 138/70, pulse (!) 55, resp. rate 17, height 5\' 7"  (1.702 m), weight 188 lb (85.3 kg), SpO2 96%.     11/13/2022   10:52 AM 08/06/2022   10:11 AM 08/06/2022    5:42 AM  Vitals with BMI  Height 5\' 7"     Weight 188 lbs    BMI 29.44    Systolic 138 135 956  Diastolic 70 68 67  Pulse 55 62 62    No data found.  Physical Exam Neck:     Vascular: No carotid bruit or JVD.  Cardiovascular:     Rate and Rhythm: Normal rate and regular rhythm.     Pulses: Intact distal pulses.     Heart sounds: Murmur heard.     Midsystolic murmur is present with a grade of 2/6 at the upper right sternal border.     No gallop.  Pulmonary:     Effort: Pulmonary effort is normal.     Breath sounds: Normal breath sounds.  Abdominal:     General: Bowel sounds are normal.     Palpations: Abdomen is soft.  Musculoskeletal:     Right lower leg: No edema.     Left lower leg: No edema.  Skin:    Capillary Refill: Capillary refill takes less than 2 seconds.    Laboratory examination:   Recent Labs    12/25/21 0214 07/25/22 1425 08/06/22 0332 11/13/22 1210  NA 134* 130* 126* 138  K 4.4 4.5 4.6 5.1  CL 103 97* 95* 101  CO2 26 23 23 24    GLUCOSE 117* 148* 167* 104*  BUN 17 21 20 27   CREATININE 1.48* 1.08 1.31* 1.24  CALCIUM 9.2 8.8* 8.4* 9.4  GFRNONAA 48* >60 56*  --        Latest Ref Rng & Units 11/13/2022   12:10 PM 08/06/2022    3:32 AM 07/25/2022    2:25 PM  CMP  Glucose 70 - 99 mg/dL 161  096  045   BUN 8 - 27 mg/dL 27  20  21    Creatinine 0.76 - 1.27 mg/dL 4.09  8.11  9.14   Sodium 134 - 144 mmol/L 138  126  130   Potassium 3.5 - 5.2 mmol/L 5.1  4.6  4.5   Chloride 96 - 106 mmol/L 101  95  97   CO2 20 - 29 mmol/L 24  23  23    Calcium 8.6 - 10.2 mg/dL 9.4  8.4  8.8       Latest Ref Rng & Units 11/13/2022   12:10 PM 08/06/2022    3:32 AM 07/25/2022    2:25 PM  CBC  WBC 3.4 - 10.8 x10E3/uL 6.5  12.5  7.7   Hemoglobin 13.0 - 17.7 g/dL 9.1  9.0  78.2   Hematocrit 37.5 - 51.0 % 29.5  27.7  32.2   Platelets 150 - 450 x10E3/uL 240  209  239    BNP (last 3 results) No results for input(s): "BNP" in the last 8760 hours.  ProBNP (last 3 results) Recent Labs    11/13/22 1210  PROBNP 667*    External labs:   Labs 11/16/2021:  Serum glucose 100 mg, BUN 22, creatinine 1.2, EGFR 58 mL, potassium 4.3, LFTs normal.  Hb 12.4/HCT 32.6, platelets 204, normal indicis.  Labs 05/15/2021:  Total cholesterol 136, triglycerides 152, HDL 41, LDL 69.  A1c 6.5%. Medications and allergies   Allergies  Allergen Reactions   Codeine Nausea And Vomiting   Guaifenesin & Derivatives Nausea And Vomiting   Terbinafine And Related Other (See Comments)    Muscle pain   Iodinated Contrast Media Itching   Penicillins Rash    Tolerates Cefazolin without problems...[employer]    Current Outpatient Medications:    acetaminophen (TYLENOL) 500 MG tablet, Take 1,000 mg by mouth every 6 (six) hours as needed for moderate pain., Disp: , Rfl:    aspirin 81 MG chewable tablet, Chew 81 mg by mouth daily., Disp: ,  Rfl:    Calcium Carbonate Antacid (TUMS ULTRA PO), Take 2 tablets by mouth daily as needed (acid reflux/indigestion.)., Disp: , Rfl:     Coenzyme Q10 (COQ10 PO), Take 300 mg by mouth 2 (two) times daily., Disp: , Rfl:    Cyanocobalamin (VITAMIN B-12) 5000 MCG TBDP, Take 5,000 mcg by mouth every evening., Disp: , Rfl:    ezetimibe (ZETIA) 10 MG tablet, TAKE 1/2 TABLET BY MOUTH EVERY EVENING, Disp: 45 tablet, Rfl: 11   fish oil-omega-3 fatty acids 1000 MG capsule, Take 1 g by mouth in the morning and at bedtime., Disp: , Rfl:    fluconazole (DIFLUCAN) 200 MG tablet, Take 200 mg by mouth once a week., Disp: , Rfl:    hydrALAZINE (APRESOLINE) 25 MG tablet, Take 1 tablet (25 mg total) by mouth 3 (three) times daily as needed (For SBP >140 mm Hg)., Disp: 270 tablet, Rfl: 3   losartan (COZAAR) 25 MG tablet, TAKE 1/2 TABLET BY MOUTH EVERY EVENING, Disp: 90 tablet, Rfl: 3   metoprolol succinate (TOPROL-XL) 25 MG 24 hr tablet, Take 0.5 tablets (12.5 mg total) by mouth every morning., Disp: 90 tablet, Rfl: 3   Multiple Vitamin (MULTIVITAMIN) tablet, Take 1 tablet by mouth in the morning. Seniors, Disp: , Rfl:    Multiple Vitamins-Minerals (PRESERVISION AREDS 2) CAPS, Take 1 capsule by mouth 2 (two) times daily., Disp: , Rfl:    pantoprazole (PROTONIX) 40 MG tablet, Take 1 tablet (40 mg total) by mouth daily., Disp: 30 tablet, Rfl: 1   Polyethyl Glycol-Propyl Glycol (SYSTANE) 0.4-0.3 % GEL ophthalmic gel, Place 1 application into both eyes at bedtime., Disp: , Rfl:    Propylene Glycol (SYSTANE BALANCE) 0.6 % SOLN, Place 1 drop into both eyes 3 (three) times daily as needed (dry/irritated eyes.)., Disp: , Rfl:    pyridoxine (B-6) 100 MG tablet, Take 100 mg by mouth every evening., Disp: , Rfl:    rosuvastatin (CRESTOR) 20 MG tablet, TAKE ONE TABLET BY MOUTH EVERYDAY AT BEDTIME, Disp: 90 tablet, Rfl: 2   sennosides-docusate sodium (SENOKOT-S) 8.6-50 MG tablet, Take 2 tablets by mouth daily., Disp: 30 tablet, Rfl: 1   traMADol (ULTRAM) 50 MG tablet, Take 50 mg by mouth 3 (three) times daily as needed., Disp: , Rfl:     triamterene-hydrochlorothiazide (MAXZIDE-25) 37.5-25 MG tablet, Take 1 tablet by mouth every morning., Disp: 30 tablet, Rfl: 2   vitamin C (ASCORBIC ACID) 500 MG tablet, Take 500 mg by mouth daily., Disp: , Rfl:    nitroGLYCERIN (NITROSTAT) 0.4 MG SL tablet, TAKE 1 TABLET EVERY 5 MINUTES AS NEEDED FOR CHEST PAIN., Disp: 25 tablet, Rfl: 3   nortriptyline (PAMELOR) 25 MG capsule, TAKE 1 CAPSULE BY MOUTH EVERYDAY AT BEDTIME (Patient not taking: Reported on 11/13/2022), Disp: 90 capsule, Rfl: 1   ondansetron (ZOFRAN) 4 MG tablet, Take 1 tablet (4 mg total) by mouth every 8 (eight) hours as needed for nausea or vomiting. (Patient not taking: Reported on 11/13/2022), Disp: 10 tablet, Rfl: 0    Radiology:   No results found.  Cardiac Studies:    SP CABG in 1996 with LIMA to LAD, free RIMA to D1 + OM1 and SVG to PDA.  Cardiac cath 06/2008 (CABG 1996.): LIMA to LAD, Skip Free radial graft to OM1 + D1 with ostial 4x15 non DES, has residual stenosis in the OM 1 limb of the skip graft. SVG to PD, Xience Stent to RCA 3x18, 3.5x15.   Carotid artery duplex 08/17/2014: No hemodynamically significant  arterial disease in the internal carotid artery bilaterally. Mild soft plaque noted.  PCV ECHOCARDIOGRAM COMPLETE 07/31/2020  Narrative Echocardiogram 07/31/2020: Left ventricle cavity is normal in size and wall thickness. Normal global wall motion. Normal LV systolic function with EF 58%. Doppler evidence of grade I (impaired) diastolic dysfunction, normal LAP. Aortic valve leaflets number difficult to ascertain. Probably has calcified immobile left coronary cusp. Mild aortic valve stenosis. Vmax 2.0 m/sec, mean PG 9 mmHg, AVA 1.8 cm2 by continuity equation. Trace tricuspid regurgitation. Estimated pulmonary artery systolic pressure 22 mmHg. No significant change compared to previous study on 08/12/2019.    Lexiscan Tetrofosmin stress test 09/30/2021: Lexiscan nuclear stress test performed using 1-day  protocol. Post Lexiscan injection, chest discomfort and nausea were reported.  SPECT images show small sized, mild intensity, partially reversible perfusion defect in basal inferior/inferolateral myocardium. Stress LVEF 59%. Low risk study. Compared to 08/10/2015, no change. Previous test was with Bruce protocol in which she was able to achieve 10.16 METS.  Left Heart Catheterization 12/24/21:  LV: 154/10, EDP 28 mmHg.  Ao 144/64, mean 95 mmHg.  No pressure gradient across the aortic valve. RCA: Dominant vessel.  Mildly diffusely diseased.  PDA has moderate diffuse disease. LM: Calcified, moderate-sized vessel.  Mild disease is noted. LAD: Large vessel, occluded in the proximal to mid segment, there is a small D1 and a large D2 which is D1 equivalent which is occluded in the ostium.  Large SP1.  Mid to distal LAD is mildly diseased. LCx: Moderate caliber vessel, calcified.  Has a high-grade 95 to 98% calcific stenosis in the proximal segment.  Gives origin to moderate-sized OM1 which is occluded in its ostium. LIMA to LAD patent. Free RIMA Y graft to D1 is patent, inferior limb to OM1 occluded.  There is a stent in the ostium of the graft.  Widely patent..  SVG to RCA occluded.   Intervention data: Successful but extremely complex and difficult PCI to the proximal and ostial circumflex followed by stenting into the left main following orbital atherectomy.  2 overlapping stents, 3.0 x 12 mm Synergy XD into the left main and into ostial circumflex overlapping the 2.5 x 24 mm Synergy XD DES.  Stenosis reduced from 95% to 0% with TIMI-3 to TIMI-3 flow maintained.        Loop Biotronik Biomonitor III for syncope and collapse 04/09/2021    Remote loop recorder transmission 10/18/2022: Normal sinus rhythm.  No symptoms reported.  EKG   EKG 11/13/2022: Sinus bradycardia at the rate of 55 bpm, left atrial enlargement, left anterior fascicular block.  IVCD.  LVH.  Compared to 05/15/2022, no significant  change.   Assessment     ICD-10-CM   1. Coronary artery disease of native artery of native heart with stable angina pectoris (HCC)  I25.118 EKG 12-Lead    Basic metabolic panel    CBC    nitroGLYCERIN (NITROSTAT) 0.4 MG SL tablet    2. Dyspnea on exertion  R06.09 Pro b natriuretic peptide (BNP)    3. Primary hypertension  I10     4. Stage 3a chronic kidney disease (HCC)  N18.31     5. Chronic diastolic heart failure (HCC)  B71.69 triamterene-hydrochlorothiazide (MAXZIDE-25) 37.5-25 MG tablet      Meds ordered this encounter  Medications   nitroGLYCERIN (NITROSTAT) 0.4 MG SL tablet    Sig: TAKE 1 TABLET EVERY 5 MINUTES AS NEEDED FOR CHEST PAIN.    Dispense:  25 tablet    Refill:  3   triamterene-hydrochlorothiazide (MAXZIDE-25) 37.5-25 MG tablet    Sig: Take 1 tablet by mouth every morning.    Dispense:  30 tablet    Refill:  2   Medications Discontinued During This Encounter  Medication Reason   clopidogrel (PLAVIX) 75 MG tablet    aspirin EC 325 MG tablet    nitroGLYCERIN (NITROSTAT) 0.4 MG SL tablet Reorder   Orders Placed This Encounter  Procedures   Basic metabolic panel   Pro b natriuretic peptide (BNP)   CBC   EKG 12-Lead   Recommendations:   [name]  is a 77 y.o. Caucasian male patient with coronary artery SP CABG in 1996, coronary stent to ostial free radial Y-graft to D1-OM1, chronic stable angina, mild aortic stenosis, hypertension, mixed hyperlipidemia, GERD, chronic back pain, recurrent syncope and dizziness, underwent loop recorder implantation on 04/09/2021.  Due to chest pain and worsening dyspnea, underwent cardiac catheterization and angioplasty to distal LM and ostial and proximal CX on 12/24/2021, has a patent LIMA to LAD and free radial graft with the ostial stent to D1.  1. Coronary artery disease of native artery of native heart with stable angina pectoris [facility]) Patient with known coronary disease and had complex coronary intervention and  stenting to the left main/circumflex coronary artery on 12/24/2021, he now has developed recurrence of similar symptoms of angina associated with marked dyspnea on exertion, presently class III in spite of aggressive medical therapy.  Suspect he probably has developed restenosis in the circumflex coronary artery stent.  I will schedule him for left heart catheterization and possible angioplasty.  - EKG 12-Lead - Basic metabolic panel - CBC - nitroGLYCERIN (NITROSTAT) 0.4 MG SL tablet; TAKE 1 TABLET EVERY 5 MINUTES AS NEEDED FOR CHEST PAIN.  Dispense: 25 tablet; Refill: 3  2. Dyspnea on exertion Patient's symptoms of angina also include dyspnea on exertion.  I will exclude any heart failure symptoms by obtaining NT proBNP as well along with routine BMP and CBC needed for cardiac catheterization.  - Pro b natriuretic peptide (BNP)  3. Primary hypertension Blood pressure is controlled, losartan and metoprolol.  4. Stage 3a chronic kidney disease (HCC) Patient's renal function has remained stable.  Will repeat labs prior to his coronary angiography.  Schedule for cardiac catheterization, and possible angioplasty. We discussed regarding risks, benefits, alternatives to this including stress testing, CTA and continued medical therapy. Patient wants to proceed. Understands <1-2% risk of death, stroke, MI, urgent CABG, bleeding, infection, renal failure but not limited to these.   He did undergo left hip arthroplasty in May 2024 and did not have any periprocedural cardiac complications.  He is now wanting to have his right hip arthroplasty as well and is being planned sometime in the beginning of the year.  Other orders - traMADol (ULTRAM) 50 MG tablet; Take 50 mg by mouth 3 (three) times daily as needed. - aspirin 81 MG chewable tablet; Chew 81 mg by mouth daily.  Addendum: Labs returned, renal function has remained stable, however BNP is elevated, in view of dyspnea on exertion, also suspect mild  chronic diastolic heart failure.  I will add Maxide 37.5/25 mg once a day in the morning.  This is a 35-minute office visit encounter.   Yates Decamp, MD, [facility] 11/16/2022, 5:18 PM Office: [phone] Pager: [phone]

## 2022-11-13 NOTE — Progress Notes (Incomplete Revision)
Primary Physician/Referring:  Gaspar Garbe, MD  Patient ID: [name], male    DOB: [birth year], 77 y.o.   MRN: [id number]  Chief Complaint  Patient presents with  . Coronary Artery Disease  . Hypertension  . Hyperlipidemia  . Follow-up    6 month    HPI:    [name]  is a 77 y.o.  Caucasian male patient with coronary artery SP CABG in 1996, coronary stent to ostial free radial Y-graft to D1-OM1, chronic stable angina, mild aortic stenosis, hypertension, mixed hyperlipidemia, GERD, chronic back pain, recurrent syncope and dizziness, underwent loop recorder implantation on 04/09/2021.  Due to chest pain and worsening dyspnea, underwent cardiac catheterization and angioplasty to distal LM and ostial and proximal CX on 12/24/2021, has a patent LIMA to LAD and free radial graft with the ostial stent to D1.  He is accompanied by his wife, states that his symptoms of dyspnea and angina has recurred and he feels similar to his symptoms that were ongoing prior to his stenting of the circumflex coronary artery.  He has also had episodes of orthopnea and PND.  He has been using nitroglycerin fairly frequently.  No rest pain.  States that even doing minimal activity he gets markedly short of breath and chest feels tight which is easily relieved with nitroglycerin.  He continues to have occasional episodes of dizziness.  He has not had any syncope.  Past Medical History:  Diagnosis Date  . Anginal pain (HCC)   . Arthritis   . Basal cell carcinoma    back of neck  . Blood transfusion without reported diagnosis 2006   during open heart surgery  . CAD (coronary artery disease)   . Diverticulosis 2013   Noted on Colonoscopy  . GERD (gastroesophageal reflux disease)   . Headache   . History of colon polyps 2008   Noted on Colonoscopy  . History of esophageal stricture 2003   Noted on EGD  . Hyperlipidemia   . Hypertension   . IVCD (intraventricular conduction defect)  05/2018   Noted on EKG  . 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 Left axis deviation 05/2018   Noted on EKG  . 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 Loop Biotronik Biomonitor III for syncope and collapse 04/09/2021 04/09/2021  . LVH (left ventricular hypertrophy) 05/2018   Noted on EKG  . Phimosis   . Pneumonia   . Syncope and collapse   . Tuberculosis 1956  . Wears glasses   . Wheezing      Social History   Tobacco Use  . Smoking status: Never  . Smokeless tobacco: Never  Substance Use Topics  . Alcohol use: No   ROS  Review of Systems  Cardiovascular:  Positive for chest pain and dyspnea on exertion. Negative for leg swelling and syncope.  Neurological:  Positive for dizziness.  SP remote CABG in 1996, Objective  Blood pressure 138/70, pulse (!) 55, resp. rate 17, height 5\' 7"  (1.702 m), weight 188 lb (85.3 kg), SpO2 96%.     11/13/2022   10:52 AM 08/06/2022   10:11 AM 08/06/2022    5:42 AM  Vitals with BMI  Height 5\' 7"     Weight 188 lbs    BMI 29.44    Systolic 138 135 657  Diastolic 70 68 67  Pulse 55 62 62    Orthostatic VS for the  past 72 hrs (Last 3 readings):  Patient Position BP Location Cuff Size  11/13/22 1052 Sitting Left Arm Large     Physical Exam Neck:     Vascular: No carotid bruit or JVD.  Cardiovascular:     Rate and Rhythm: Normal rate and regular rhythm.     Pulses: Intact distal pulses.     Heart sounds: Murmur heard.     Midsystolic murmur is present with a grade of 2/6 at the upper right sternal border.     No gallop.  Pulmonary:     Effort: Pulmonary effort is normal.     Breath sounds: Normal breath sounds.  Abdominal:     General: Bowel sounds are normal.     Palpations: Abdomen is soft.  Musculoskeletal:     Right lower leg: No edema.     Left lower leg: No edema.  Skin:    Capillary Refill: Capillary refill takes less than 2 seconds.   Laboratory examination:    Recent Labs    12/25/21 0214 07/25/22 1425 08/06/22 0332 11/13/22 1210  NA 134* 130* 126* 138  K 4.4 4.5 4.6 5.1  CL 103 97* 95* 101  CO2 26 23 23 24   GLUCOSE 117* 148* 167* 104*  BUN 17 21 20 27   CREATININE 1.48* 1.08 1.31* 1.24  CALCIUM 9.2 8.8* 8.4* 9.4  GFRNONAA 48* >60 56*  --        Latest Ref Rng & Units 11/13/2022   12:10 PM 08/06/2022    3:32 AM 07/25/2022    2:25 PM  CMP  Glucose 70 - 99 mg/dL 161  096  045   BUN 8 - 27 mg/dL 27  20  21    Creatinine 0.76 - 1.27 mg/dL 4.09  8.11  9.14   Sodium 134 - 144 mmol/L 138  126  130   Potassium 3.5 - 5.2 mmol/L 5.1  4.6  4.5   Chloride 96 - 106 mmol/L 101  95  97   CO2 20 - 29 mmol/L 24  23  23    Calcium 8.6 - 10.2 mg/dL 9.4  8.4  8.8       Latest Ref Rng & Units 11/13/2022   12:10 PM 08/06/2022    3:32 AM 07/25/2022    2:25 PM  CBC  WBC 3.4 - 10.8 x10E3/uL 6.5  12.5  7.7   Hemoglobin 13.0 - 17.7 g/dL 9.1  9.0  78.2   Hematocrit 37.5 - 51.0 % 29.5  27.7  32.2   Platelets 150 - 450 x10E3/uL 240  209  239    External labs:   Labs 11/16/2021:  Serum glucose 100 mg, BUN 22, creatinine 1.2, EGFR 58 mL, potassium 4.3, LFTs normal.  Hb 12.4/HCT 32.6, platelets 204, normal indicis.  Labs 05/15/2021:  Total cholesterol 136, triglycerides 152, HDL 41, LDL 69.  A1c 6.5%. Medications and allergies   Allergies  Allergen Reactions  . Codeine Nausea And Vomiting  . Guaifenesin & Derivatives Nausea And Vomiting  . Terbinafine And Related Other (See Comments)    Muscle pain  . Iodinated Contrast Media Itching  . Penicillins Rash    Tolerates Cefazolin without problems...[employer]    Current Outpatient Medications:  .  acetaminophen (TYLENOL) 500 MG tablet, Take 1,000 mg by mouth every 6 (six) hours as needed for moderate pain., Disp: , Rfl:  .  aspirin 81 MG chewable tablet, Chew 81 mg by mouth daily., Disp: , Rfl:  .  Calcium Carbonate Antacid (TUMS  ULTRA PO), Take 2 tablets by mouth daily as needed (acid  reflux/indigestion.)., Disp: , Rfl:  .  Coenzyme Q10 (COQ10 PO), Take 300 mg by mouth 2 (two) times daily., Disp: , Rfl:  .  Cyanocobalamin (VITAMIN B-12) 5000 MCG TBDP, Take 5,000 mcg by mouth every evening., Disp: , Rfl:  .  ezetimibe (ZETIA) 10 MG tablet, TAKE 1/2 TABLET BY MOUTH EVERY EVENING, Disp: 45 tablet, Rfl: 11 .  fish oil-omega-3 fatty acids 1000 MG capsule, Take 1 g by mouth in the morning and at bedtime., Disp: , Rfl:  .  fluconazole (DIFLUCAN) 200 MG tablet, Take 200 mg by mouth once a week., Disp: , Rfl:  .  hydrALAZINE (APRESOLINE) 25 MG tablet, Take 1 tablet (25 mg total) by mouth 3 (three) times daily as needed (For SBP >140 mm Hg)., Disp: 270 tablet, Rfl: 3 .  losartan (COZAAR) 25 MG tablet, TAKE 1/2 TABLET BY MOUTH EVERY EVENING, Disp: 90 tablet, Rfl: 3 .  metoprolol succinate (TOPROL-XL) 25 MG 24 hr tablet, Take 0.5 tablets (12.5 mg total) by mouth every morning., Disp: 90 tablet, Rfl: 3 .  Multiple Vitamin (MULTIVITAMIN) tablet, Take 1 tablet by mouth in the morning. Seniors, Disp: , Rfl:  .  Multiple Vitamins-Minerals (PRESERVISION AREDS 2) CAPS, Take 1 capsule by mouth 2 (two) times daily., Disp: , Rfl:  .  pantoprazole (PROTONIX) 40 MG tablet, Take 1 tablet (40 mg total) by mouth daily., Disp: 30 tablet, Rfl: 1 .  Polyethyl Glycol-Propyl Glycol (SYSTANE) 0.4-0.3 % GEL ophthalmic gel, Place 1 application into both eyes at bedtime., Disp: , Rfl:  .  Propylene Glycol (SYSTANE BALANCE) 0.6 % SOLN, Place 1 drop into both eyes 3 (three) times daily as needed (dry/irritated eyes.)., Disp: , Rfl:  .  pyridoxine (B-6) 100 MG tablet, Take 100 mg by mouth every evening., Disp: , Rfl:  .  rosuvastatin (CRESTOR) 20 MG tablet, TAKE ONE TABLET BY MOUTH EVERYDAY AT BEDTIME, Disp: 90 tablet, Rfl: 2 .  sennosides-docusate sodium (SENOKOT-S) 8.6-50 MG tablet, Take 2 tablets by mouth daily., Disp: 30 tablet, Rfl: 1 .  traMADol (ULTRAM) 50 MG tablet, Take 50 mg by mouth 3 (three) times daily as  needed., Disp: , Rfl:  .  vitamin C (ASCORBIC ACID) 500 MG tablet, Take 500 mg by mouth daily., Disp: , Rfl:  .  nitroGLYCERIN (NITROSTAT) 0.4 MG SL tablet, TAKE 1 TABLET EVERY 5 MINUTES AS NEEDED FOR CHEST PAIN., Disp: 25 tablet, Rfl: 3 .  nortriptyline (PAMELOR) 25 MG capsule, TAKE 1 CAPSULE BY MOUTH EVERYDAY AT BEDTIME (Patient not taking: Reported on 11/13/2022), Disp: 90 capsule, Rfl: 1 .  ondansetron (ZOFRAN) 4 MG tablet, Take 1 tablet (4 mg total) by mouth every 8 (eight) hours as needed for nausea or vomiting. (Patient not taking: Reported on 11/13/2022), Disp: 10 tablet, Rfl: 0    Radiology:   No results found.  Cardiac Studies:    SP CABG in 1996 with LIMA to LAD, free RIMA to D1 + OM1 and SVG to PDA.  Cardiac cath 06/2008 (CABG 1996.): LIMA to LAD, Skip Free radial graft to OM1 + D1 with ostial 4x15 non DES, has residual stenosis in the OM 1 limb of the skip graft. SVG to PD, Xience Stent to RCA 3x18, 3.5x15.   Carotid artery duplex 08/17/2014: No hemodynamically significant arterial disease in the internal carotid artery bilaterally. Mild soft plaque noted.  PCV ECHOCARDIOGRAM COMPLETE 07/31/2020  Narrative Echocardiogram 07/31/2020: Left ventricle cavity is normal in  size and wall thickness. Normal global wall motion. Normal LV systolic function with EF 58%. Doppler evidence of grade I (impaired) diastolic dysfunction, normal LAP. Aortic valve leaflets number difficult to ascertain. Probably has calcified immobile left coronary cusp. Mild aortic valve stenosis. Vmax 2.0 m/sec, mean PG 9 mmHg, AVA 1.8 cm2 by continuity equation. Trace tricuspid regurgitation. Estimated pulmonary artery systolic pressure 22 mmHg. No significant change compared to previous study on 08/12/2019.    Lexiscan Tetrofosmin stress test 09/30/2021: Lexiscan nuclear stress test performed using 1-day protocol. Post Lexiscan injection, chest discomfort and nausea were reported.  SPECT images show small sized,  mild intensity, partially reversible perfusion defect in basal inferior/inferolateral myocardium. Stress LVEF 59%. Low risk study. Compared to 08/10/2015, no change. Previous test was with Bruce protocol in which she was able to achieve 10.16 METS.  Left Heart Catheterization 12/24/21:  LV: 154/10, EDP 28 mmHg.  Ao 144/64, mean 95 mmHg.  No pressure gradient across the aortic valve. RCA: Dominant vessel.  Mildly diffusely diseased.  PDA has moderate diffuse disease. LM: Calcified, moderate-sized vessel.  Mild disease is noted. LAD: Large vessel, occluded in the proximal to mid segment, there is a small D1 and a large D2 which is D1 equivalent which is occluded in the ostium.  Large SP1.  Mid to distal LAD is mildly diseased. LCx: Moderate caliber vessel, calcified.  Has a high-grade 95 to 98% calcific stenosis in the proximal segment.  Gives origin to moderate-sized OM1 which is occluded in its ostium. LIMA to LAD patent. Free RIMA Y graft to D1 is patent, inferior limb to OM1 occluded.  There is a stent in the ostium of the graft.  Widely patent..  SVG to RCA occluded.   Intervention data: Successful but extremely complex and difficult PCI to the proximal and ostial circumflex followed by stenting into the left main following orbital atherectomy.  2 overlapping stents, 3.0 x 12 mm Synergy XD into the left main and into ostial circumflex overlapping the 2.5 x 24 mm Synergy XD DES.  Stenosis reduced from 95% to 0% with TIMI-3 to TIMI-3 flow maintained.        Loop Biotronik Biomonitor III for syncope and collapse 04/09/2021    Remote loop recorder transmission 10/18/2022: Normal sinus rhythm.  No symptoms reported.  EKG   EKG 11/13/2022: Sinus bradycardia at the rate of 55 bpm, left atrial enlargement, left anterior fascicular block.  IVCD.  LVH.  Compared to 05/15/2022, no significant change.   Assessment     ICD-10-CM   1. Coronary artery disease of native artery of native heart with  stable angina pectoris (HCC)  I25.118 EKG 12-Lead    Basic metabolic panel    CBC    nitroGLYCERIN (NITROSTAT) 0.4 MG SL tablet    2. Dyspnea on exertion  R06.09 Pro b natriuretic peptide (BNP)    3. Primary hypertension  I10     4. Stage 3a chronic kidney disease (HCC)  N18.31       Meds ordered this encounter  Medications  . nitroGLYCERIN (NITROSTAT) 0.4 MG SL tablet    Sig: TAKE 1 TABLET EVERY 5 MINUTES AS NEEDED FOR CHEST PAIN.    Dispense:  25 tablet    Refill:  3   Medications Discontinued During This Encounter  Medication Reason  . clopidogrel (PLAVIX) 75 MG tablet   . aspirin EC 325 MG tablet   . nitroGLYCERIN (NITROSTAT) 0.4 MG SL tablet Reorder   Orders Placed This Encounter  Procedures  . Basic metabolic panel  . Pro b natriuretic peptide (BNP)  . CBC  . EKG 12-Lead   Recommendations:   [name]  is a 77 y.o. Caucasian male patient with coronary artery SP CABG in 1996, coronary stent to ostial free radial Y-graft to D1-OM1, chronic stable angina, mild aortic stenosis, hypertension, mixed hyperlipidemia, GERD, chronic back pain, recurrent syncope and dizziness, underwent loop recorder implantation on 04/09/2021.  Due to chest pain and worsening dyspnea, underwent cardiac catheterization and angioplasty to distal LM and ostial and proximal CX on 12/24/2021, has a patent LIMA to LAD and free radial graft with the ostial stent to D1.  1. Coronary artery disease of native artery of native heart with stable angina pectoris [facility]) Patient with known coronary disease and had complex coronary intervention and stenting to the left main/circumflex coronary artery on 12/24/2021, he now has developed recurrence of similar symptoms of angina associated with marked dyspnea on exertion, presently class III in spite of aggressive medical therapy.  Suspect he probably has developed restenosis in the circumflex coronary artery stent.  I will schedule him for left heart  catheterization and possible angioplasty.  - EKG 12-Lead - Basic metabolic panel - CBC - nitroGLYCERIN (NITROSTAT) 0.4 MG SL tablet; TAKE 1 TABLET EVERY 5 MINUTES AS NEEDED FOR CHEST PAIN.  Dispense: 25 tablet; Refill: 3  2. Dyspnea on exertion Patient's symptoms of angina also include dyspnea on exertion.  I will exclude any heart failure symptoms by obtaining NT proBNP as well along with routine BMP and CBC needed for cardiac catheterization.  - Pro b natriuretic peptide (BNP)  3. Primary hypertension Blood pressure is controlled, losartan and metoprolol.  4. Stage 3a chronic kidney disease (HCC) Patient's renal function has remained stable.  Will repeat labs prior to his coronary angiography.  Schedule for cardiac catheterization, and possible angioplasty. We discussed regarding risks, benefits, alternatives to this including stress testing, CTA and continued medical therapy. Patient wants to proceed. Understands <1-2% risk of death, stroke, MI, urgent CABG, bleeding, infection, renal failure but not limited to these.   He did undergo left hip arthroplasty in May 2024 and did not have any periprocedural cardiac complications.  He is now wanting to have his right hip arthroplasty as well and is being planned sometime in the beginning of the year.  Other orders - traMADol (ULTRAM) 50 MG tablet; Take 50 mg by mouth 3 (three) times daily as needed. - aspirin 81 MG chewable tablet; Chew 81 mg by mouth daily.   Yates Decamp, MD, [facility] 11/14/2022, 3:36 PM Office: [phone] Pager: [phone]

## 2022-11-13 NOTE — H&P (View-Only) (Signed)
Primary Physician/Referring:  Gaspar Garbe, MD  Patient ID: [name], male    DOB: [birth year], 77 y.o.   MRN: [id number]  Chief Complaint  Patient presents with   Coronary Artery Disease   Hypertension   Hyperlipidemia   Follow-up    6 month    HPI:    [name]  is a 77 y.o.  Caucasian male patient with coronary artery SP CABG in 1996, coronary stent to ostial free radial Y-graft to D1-OM1, chronic stable angina, mild aortic stenosis, hypertension, mixed hyperlipidemia, GERD, chronic back pain, recurrent syncope and dizziness, underwent loop recorder implantation on 04/09/2021.  Due to chest pain and worsening dyspnea, underwent cardiac catheterization and angioplasty to distal LM and ostial and proximal CX on 12/24/2021, has a patent LIMA to LAD and free radial graft with the ostial stent to D1.  He is accompanied by his wife, states that his symptoms of dyspnea and angina has recurred and he feels similar to his symptoms that were ongoing prior to his stenting of the circumflex coronary artery.  He has also had episodes of orthopnea and PND.  He has been using nitroglycerin fairly frequently.  No rest pain.  States that even doing minimal activity he gets markedly short of breath and chest feels tight which is easily relieved with nitroglycerin.  He continues to have occasional episodes of dizziness.  He has not had any syncope.  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 Negative for leg swelling and syncope.  Neurological:  Positive for dizziness.  SP remote CABG in 1996, Objective  Blood pressure 138/70, pulse (!) 55, resp. rate 17, height 5\' 7"  (1.702 m), weight 188 lb (85.3 kg), SpO2 96%.     11/13/2022   10:52 AM 08/06/2022   10:11 AM 08/06/2022    5:42 AM  Vitals with BMI  Height 5\' 7"     Weight 188 lbs    BMI 29.44    Systolic 138 135 478  Diastolic 70 68 67  Pulse 55 62 62    No data found.  Physical Exam Neck:     Vascular: No carotid bruit or JVD.  Cardiovascular:     Rate and Rhythm: Normal rate and regular rhythm.     Pulses: Intact distal pulses.     Heart sounds: Murmur heard.     Midsystolic murmur is present with a grade of 2/6 at the upper right sternal border.     No gallop.  Pulmonary:     Effort: Pulmonary effort is normal.     Breath sounds: Normal breath sounds.  Abdominal:     General: Bowel sounds are normal.     Palpations: Abdomen is soft.  Musculoskeletal:     Right lower leg: No edema.     Left lower leg: No edema.  Skin:    Capillary Refill: Capillary refill takes less than 2 seconds.    Laboratory examination:   Recent Labs    12/25/21 0214 07/25/22 1425 08/06/22 0332 11/13/22 1210  NA 134* 130* 126* 138  K 4.4 4.5 4.6 5.1  CL 103 97* 95* 101  CO2 26 23 23 24    GLUCOSE 117* 148* 167* 104*  BUN 17 21 20 27   CREATININE 1.48* 1.08 1.31* 1.24  CALCIUM 9.2 8.8* 8.4* 9.4  GFRNONAA 48* >60 56*  --        Latest Ref Rng & Units 11/13/2022   12:10 PM 08/06/2022    3:32 AM 07/25/2022    2:25 PM  CMP  Glucose 70 - 99 mg/dL 161  096  045   BUN 8 - 27 mg/dL 27  20  21    Creatinine 0.76 - 1.27 mg/dL 4.09  8.11  9.14   Sodium 134 - 144 mmol/L 138  126  130   Potassium 3.5 - 5.2 mmol/L 5.1  4.6  4.5   Chloride 96 - 106 mmol/L 101  95  97   CO2 20 - 29 mmol/L 24  23  23    Calcium 8.6 - 10.2 mg/dL 9.4  8.4  8.8       Latest Ref Rng & Units 11/13/2022   12:10 PM 08/06/2022    3:32 AM 07/25/2022    2:25 PM  CBC  WBC 3.4 - 10.8 x10E3/uL 6.5  12.5  7.7   Hemoglobin 13.0 - 17.7 g/dL 9.1  9.0  78.2   Hematocrit 37.5 - 51.0 % 29.5  27.7  32.2   Platelets 150 - 450 x10E3/uL 240  209  239    BNP (last 3 results) No results for input(s): "BNP" in the last 8760 hours.  ProBNP (last 3 results) Recent Labs    11/13/22 1210  PROBNP 667*    External labs:   Labs 11/16/2021:  Serum glucose 100 mg, BUN 22, creatinine 1.2, EGFR 58 mL, potassium 4.3, LFTs normal.  Hb 12.4/HCT 32.6, platelets 204, normal indicis.  Labs 05/15/2021:  Total cholesterol 136, triglycerides 152, HDL 41, LDL 69.  A1c 6.5%. Medications and allergies   Allergies  Allergen Reactions   Codeine Nausea And Vomiting   Guaifenesin & Derivatives Nausea And Vomiting   Terbinafine And Related Other (See Comments)    Muscle pain   Iodinated Contrast Media Itching   Penicillins Rash    Tolerates Cefazolin without problems...[employer]    Current Outpatient Medications:    acetaminophen (TYLENOL) 500 MG tablet, Take 1,000 mg by mouth every 6 (six) hours as needed for moderate pain., Disp: , Rfl:    aspirin 81 MG chewable tablet, Chew 81 mg by mouth daily., Disp: ,  Rfl:    Calcium Carbonate Antacid (TUMS ULTRA PO), Take 2 tablets by mouth daily as needed (acid reflux/indigestion.)., Disp: , Rfl:     Coenzyme Q10 (COQ10 PO), Take 300 mg by mouth 2 (two) times daily., Disp: , Rfl:    Cyanocobalamin (VITAMIN B-12) 5000 MCG TBDP, Take 5,000 mcg by mouth every evening., Disp: , Rfl:    ezetimibe (ZETIA) 10 MG tablet, TAKE 1/2 TABLET BY MOUTH EVERY EVENING, Disp: 45 tablet, Rfl: 11   fish oil-omega-3 fatty acids 1000 MG capsule, Take 1 g by mouth in the morning and at bedtime., Disp: , Rfl:    fluconazole (DIFLUCAN) 200 MG tablet, Take 200 mg by mouth once a week., Disp: , Rfl:    hydrALAZINE (APRESOLINE) 25 MG tablet, Take 1 tablet (25 mg total) by mouth 3 (three) times daily as needed (For SBP >140 mm Hg)., Disp: 270 tablet, Rfl: 3   losartan (COZAAR) 25 MG tablet, TAKE 1/2 TABLET BY MOUTH EVERY EVENING, Disp: 90 tablet, Rfl: 3   metoprolol succinate (TOPROL-XL) 25 MG 24 hr tablet, Take 0.5 tablets (12.5 mg total) by mouth every morning., Disp: 90 tablet, Rfl: 3   Multiple Vitamin (MULTIVITAMIN) tablet, Take 1 tablet by mouth in the morning. Seniors, Disp: , Rfl:    Multiple Vitamins-Minerals (PRESERVISION AREDS 2) CAPS, Take 1 capsule by mouth 2 (two) times daily., Disp: , Rfl:    pantoprazole (PROTONIX) 40 MG tablet, Take 1 tablet (40 mg total) by mouth daily., Disp: 30 tablet, Rfl: 1   Polyethyl Glycol-Propyl Glycol (SYSTANE) 0.4-0.3 % GEL ophthalmic gel, Place 1 application into both eyes at bedtime., Disp: , Rfl:    Propylene Glycol (SYSTANE BALANCE) 0.6 % SOLN, Place 1 drop into both eyes 3 (three) times daily as needed (dry/irritated eyes.)., Disp: , Rfl:    pyridoxine (B-6) 100 MG tablet, Take 100 mg by mouth every evening., Disp: , Rfl:    rosuvastatin (CRESTOR) 20 MG tablet, TAKE ONE TABLET BY MOUTH EVERYDAY AT BEDTIME, Disp: 90 tablet, Rfl: 2   sennosides-docusate sodium (SENOKOT-S) 8.6-50 MG tablet, Take 2 tablets by mouth daily., Disp: 30 tablet, Rfl: 1   traMADol (ULTRAM) 50 MG tablet, Take 50 mg by mouth 3 (three) times daily as needed., Disp: , Rfl:     triamterene-hydrochlorothiazide (MAXZIDE-25) 37.5-25 MG tablet, Take 1 tablet by mouth every morning., Disp: 30 tablet, Rfl: 2   vitamin C (ASCORBIC ACID) 500 MG tablet, Take 500 mg by mouth daily., Disp: , Rfl:    nitroGLYCERIN (NITROSTAT) 0.4 MG SL tablet, TAKE 1 TABLET EVERY 5 MINUTES AS NEEDED FOR CHEST PAIN., Disp: 25 tablet, Rfl: 3   nortriptyline (PAMELOR) 25 MG capsule, TAKE 1 CAPSULE BY MOUTH EVERYDAY AT BEDTIME (Patient not taking: Reported on 11/13/2022), Disp: 90 capsule, Rfl: 1   ondansetron (ZOFRAN) 4 MG tablet, Take 1 tablet (4 mg total) by mouth every 8 (eight) hours as needed for nausea or vomiting. (Patient not taking: Reported on 11/13/2022), Disp: 10 tablet, Rfl: 0    Radiology:   No results found.  Cardiac Studies:    SP CABG in 1996 with LIMA to LAD, free RIMA to D1 + OM1 and SVG to PDA.  Cardiac cath 06/2008 (CABG 1996.): LIMA to LAD, Skip Free radial graft to OM1 + D1 with ostial 4x15 non DES, has residual stenosis in the OM 1 limb of the skip graft. SVG to PD, Xience Stent to RCA 3x18, 3.5x15.   Carotid artery duplex 08/17/2014: No hemodynamically significant  arterial disease in the internal carotid artery bilaterally. Mild soft plaque noted.  PCV ECHOCARDIOGRAM COMPLETE 07/31/2020  Narrative Echocardiogram 07/31/2020: Left ventricle cavity is normal in size and wall thickness. Normal global wall motion. Normal LV systolic function with EF 58%. Doppler evidence of grade I (impaired) diastolic dysfunction, normal LAP. Aortic valve leaflets number difficult to ascertain. Probably has calcified immobile left coronary cusp. Mild aortic valve stenosis. Vmax 2.0 m/sec, mean PG 9 mmHg, AVA 1.8 cm2 by continuity equation. Trace tricuspid regurgitation. Estimated pulmonary artery systolic pressure 22 mmHg. No significant change compared to previous study on 08/12/2019.    Lexiscan Tetrofosmin stress test 09/30/2021: Lexiscan nuclear stress test performed using 1-day  protocol. Post Lexiscan injection, chest discomfort and nausea were reported.  SPECT images show small sized, mild intensity, partially reversible perfusion defect in basal inferior/inferolateral myocardium. Stress LVEF 59%. Low risk study. Compared to 08/10/2015, no change. Previous test was with Bruce protocol in which she was able to achieve 10.16 METS.  Left Heart Catheterization 12/24/21:  LV: 154/10, EDP 28 mmHg.  Ao 144/64, mean 95 mmHg.  No pressure gradient across the aortic valve. RCA: Dominant vessel.  Mildly diffusely diseased.  PDA has moderate diffuse disease. LM: Calcified, moderate-sized vessel.  Mild disease is noted. LAD: Large vessel, occluded in the proximal to mid segment, there is a small D1 and a large D2 which is D1 equivalent which is occluded in the ostium.  Large SP1.  Mid to distal LAD is mildly diseased. LCx: Moderate caliber vessel, calcified.  Has a high-grade 95 to 98% calcific stenosis in the proximal segment.  Gives origin to moderate-sized OM1 which is occluded in its ostium. LIMA to LAD patent. Free RIMA Y graft to D1 is patent, inferior limb to OM1 occluded.  There is a stent in the ostium of the graft.  Widely patent..  SVG to RCA occluded.   Intervention data: Successful but extremely complex and difficult PCI to the proximal and ostial circumflex followed by stenting into the left main following orbital atherectomy.  2 overlapping stents, 3.0 x 12 mm Synergy XD into the left main and into ostial circumflex overlapping the 2.5 x 24 mm Synergy XD DES.  Stenosis reduced from 95% to 0% with TIMI-3 to TIMI-3 flow maintained.        Loop Biotronik Biomonitor III for syncope and collapse 04/09/2021    Remote loop recorder transmission 10/18/2022: Normal sinus rhythm.  No symptoms reported.  EKG   EKG 11/13/2022: Sinus bradycardia at the rate of 55 bpm, left atrial enlargement, left anterior fascicular block.  IVCD.  LVH.  Compared to 05/15/2022, no significant  change.   Assessment     ICD-10-CM   1. Coronary artery disease of native artery of native heart with stable angina pectoris (HCC)  I25.118 EKG 12-Lead    Basic metabolic panel    CBC    nitroGLYCERIN (NITROSTAT) 0.4 MG SL tablet    2. Dyspnea on exertion  R06.09 Pro b natriuretic peptide (BNP)    3. Primary hypertension  I10     4. Stage 3a chronic kidney disease (HCC)  N18.31     5. Chronic diastolic heart failure (HCC)  N56.21 triamterene-hydrochlorothiazide (MAXZIDE-25) 37.5-25 MG tablet      Meds ordered this encounter  Medications   nitroGLYCERIN (NITROSTAT) 0.4 MG SL tablet    Sig: TAKE 1 TABLET EVERY 5 MINUTES AS NEEDED FOR CHEST PAIN.    Dispense:  25 tablet    Refill:  3   triamterene-hydrochlorothiazide (MAXZIDE-25) 37.5-25 MG tablet    Sig: Take 1 tablet by mouth every morning.    Dispense:  30 tablet    Refill:  2   Medications Discontinued During This Encounter  Medication Reason   clopidogrel (PLAVIX) 75 MG tablet    aspirin EC 325 MG tablet    nitroGLYCERIN (NITROSTAT) 0.4 MG SL tablet Reorder   Orders Placed This Encounter  Procedures   Basic metabolic panel   Pro b natriuretic peptide (BNP)   CBC   EKG 12-Lead   Recommendations:   [name]  is a 77 y.o. Caucasian male patient with coronary artery SP CABG in 1996, coronary stent to ostial free radial Y-graft to D1-OM1, chronic stable angina, mild aortic stenosis, hypertension, mixed hyperlipidemia, GERD, chronic back pain, recurrent syncope and dizziness, underwent loop recorder implantation on 04/09/2021.  Due to chest pain and worsening dyspnea, underwent cardiac catheterization and angioplasty to distal LM and ostial and proximal CX on 12/24/2021, has a patent LIMA to LAD and free radial graft with the ostial stent to D1.  1. Coronary artery disease of native artery of native heart with stable angina pectoris [facility]) Patient with known coronary disease and had complex coronary intervention and  stenting to the left main/circumflex coronary artery on 12/24/2021, he now has developed recurrence of similar symptoms of angina associated with marked dyspnea on exertion, presently class III in spite of aggressive medical therapy.  Suspect he probably has developed restenosis in the circumflex coronary artery stent.  I will schedule him for left heart catheterization and possible angioplasty.  - EKG 12-Lead - Basic metabolic panel - CBC - nitroGLYCERIN (NITROSTAT) 0.4 MG SL tablet; TAKE 1 TABLET EVERY 5 MINUTES AS NEEDED FOR CHEST PAIN.  Dispense: 25 tablet; Refill: 3  2. Dyspnea on exertion Patient's symptoms of angina also include dyspnea on exertion.  I will exclude any heart failure symptoms by obtaining NT proBNP as well along with routine BMP and CBC needed for cardiac catheterization.  - Pro b natriuretic peptide (BNP)  3. Primary hypertension Blood pressure is controlled, losartan and metoprolol.  4. Stage 3a chronic kidney disease (HCC) Patient's renal function has remained stable.  Will repeat labs prior to his coronary angiography.  Schedule for cardiac catheterization, and possible angioplasty. We discussed regarding risks, benefits, alternatives to this including stress testing, CTA and continued medical therapy. Patient wants to proceed. Understands <1-2% risk of death, stroke, MI, urgent CABG, bleeding, infection, renal failure but not limited to these.   He did undergo left hip arthroplasty in May 2024 and did not have any periprocedural cardiac complications.  He is now wanting to have his right hip arthroplasty as well and is being planned sometime in the beginning of the year.  Other orders - traMADol (ULTRAM) 50 MG tablet; Take 50 mg by mouth 3 (three) times daily as needed. - aspirin 81 MG chewable tablet; Chew 81 mg by mouth daily.  Addendum: Labs returned, renal function has remained stable, however BNP is elevated, in view of dyspnea on exertion, also suspect mild  chronic diastolic heart failure.  I will add Maxide 37.5/25 mg once a day in the morning.  This is a 35-minute office visit encounter.   Yates Decamp, MD, [facility] 11/16/2022, 5:18 PM Office: [phone] Pager: [phone]

## 2022-11-14 LAB — BASIC METABOLIC PANEL
BUN/Creatinine Ratio: 22 (ref 10–24)
BUN: 27 mg/dL (ref 8–27)
CO2: 24 mmol/L (ref 20–29)
Calcium: 9.4 mg/dL (ref 8.6–10.2)
Chloride: 101 mmol/L (ref 96–106)
Creatinine, Ser: 1.24 mg/dL (ref 0.76–1.27)
Glucose: 104 mg/dL — ABNORMAL HIGH (ref 70–99)
Potassium: 5.1 mmol/L (ref 3.5–5.2)
Sodium: 138 mmol/L (ref 134–144)
eGFR: 60 mL/min/{1.73_m2} (ref >59)

## 2022-11-14 LAB — PRO B NATRIURETIC PEPTIDE: NT-Pro BNP: 667 pg/mL — ABNORMAL HIGH (ref 0–486)

## 2022-11-14 LAB — CBC
Hematocrit: 29.5 % — ABNORMAL LOW (ref 37.5–51.0)
Hemoglobin: 9.1 g/dL — ABNORMAL LOW (ref 13.0–17.7)
MCH: 26.8 pg (ref 26.6–33.0)
MCHC: 30.8 g/dL — ABNORMAL LOW (ref 31.5–35.7)
MCV: 87 fL (ref 79–97)
Platelets: 240 10*3/uL (ref 150–450)
RBC: 3.4 x10E6/uL — ABNORMAL LOW (ref 4.14–5.80)
RDW: 15.6 % — ABNORMAL HIGH (ref 11.6–15.4)
WBC: 6.5 10*3/uL (ref 3.4–10.8)

## 2022-11-16 ENCOUNTER — Encounter: Payer: Self-pay | Admitting: Cardiology

## 2022-11-16 MED ORDER — TRIAMTERENE-HCTZ 37.5-25 MG PO TABS
1.0000 | ORAL_TABLET | ORAL | 2 refills | Status: DC
Start: 2022-11-16 — End: 2022-11-25

## 2022-11-16 NOTE — Addendum Note (Signed)
Addended by: Delrae Rend on: 11/16/2022 05:19 PM   Modules accepted: Orders, Level of Service

## 2022-11-17 ENCOUNTER — Other Ambulatory Visit: Payer: Self-pay | Admitting: Cardiology

## 2022-11-17 DIAGNOSIS — I25118 Atherosclerotic heart disease of native coronary artery with other forms of angina pectoris: Secondary | ICD-10-CM

## 2022-11-17 DIAGNOSIS — I10 Essential (primary) hypertension: Secondary | ICD-10-CM

## 2022-11-18 DIAGNOSIS — Z4509 Encounter for adjustment and management of other cardiac device: Secondary | ICD-10-CM | POA: Diagnosis not present

## 2022-11-18 DIAGNOSIS — Z95818 Presence of other cardiac implants and grafts: Secondary | ICD-10-CM | POA: Diagnosis not present

## 2022-11-18 DIAGNOSIS — R55 Syncope and collapse: Secondary | ICD-10-CM | POA: Diagnosis not present

## 2022-11-20 ENCOUNTER — Telehealth: Payer: Self-pay

## 2022-11-20 ENCOUNTER — Encounter (INDEPENDENT_AMBULATORY_CARE_PROVIDER_SITE_OTHER): Payer: PPO | Admitting: Ophthalmology

## 2022-11-20 DIAGNOSIS — H43813 Vitreous degeneration, bilateral: Secondary | ICD-10-CM

## 2022-11-20 DIAGNOSIS — H35033 Hypertensive retinopathy, bilateral: Secondary | ICD-10-CM | POA: Diagnosis not present

## 2022-11-20 DIAGNOSIS — I10 Essential (primary) hypertension: Secondary | ICD-10-CM | POA: Diagnosis not present

## 2022-11-20 DIAGNOSIS — H34832 Tributary (branch) retinal vein occlusion, left eye, with macular edema: Secondary | ICD-10-CM | POA: Diagnosis not present

## 2022-11-20 NOTE — Telephone Encounter (Signed)
Patient called and stated hat patient has been having chest pains and the nitroglycerin does help, but he has noticed that he has been having them more frequently. He wants to know what you want him to do now. Should he go to ED? Please advise.

## 2022-11-20 NOTE — Telephone Encounter (Signed)
Find out when his cath is, set him up this Friday or Tuesday or when I am on call in the hospital. Advice patient to take it easy and if rest pain then has to go to the hospital

## 2022-11-24 ENCOUNTER — Other Ambulatory Visit: Payer: Self-pay | Admitting: Cardiology

## 2022-11-24 DIAGNOSIS — I10 Essential (primary) hypertension: Secondary | ICD-10-CM

## 2022-11-24 DIAGNOSIS — I25118 Atherosclerotic heart disease of native coronary artery with other forms of angina pectoris: Secondary | ICD-10-CM

## 2022-11-25 ENCOUNTER — Ambulatory Visit (HOSPITAL_COMMUNITY)
Admission: RE | Admit: 2022-11-25 | Discharge: 2022-11-25 | Disposition: A | Discharge status: Home / Self Care | Payer: PPO | Attending: Cardiology | Admitting: Cardiology

## 2022-11-25 ENCOUNTER — Encounter (HOSPITAL_COMMUNITY)
Admission: RE | Disposition: A | Discharge status: Home / Self Care | Payer: Self-pay | Source: Home / Self Care | Attending: Cardiology

## 2022-11-25 DIAGNOSIS — I350 Nonrheumatic aortic (valve) stenosis: Secondary | ICD-10-CM | POA: Insufficient documentation

## 2022-11-25 DIAGNOSIS — E782 Mixed hyperlipidemia: Secondary | ICD-10-CM | POA: Insufficient documentation

## 2022-11-25 DIAGNOSIS — Z951 Presence of aortocoronary bypass graft: Secondary | ICD-10-CM | POA: Insufficient documentation

## 2022-11-25 DIAGNOSIS — I5032 Chronic diastolic (congestive) heart failure: Secondary | ICD-10-CM | POA: Diagnosis not present

## 2022-11-25 DIAGNOSIS — I130 Hypertensive heart and chronic kidney disease with heart failure and stage 1 through stage 4 chronic kidney disease, or unspecified chronic kidney disease: Secondary | ICD-10-CM | POA: Insufficient documentation

## 2022-11-25 DIAGNOSIS — I25708 Atherosclerosis of coronary artery bypass graft(s), unspecified, with other forms of angina pectoris: Secondary | ICD-10-CM

## 2022-11-25 DIAGNOSIS — I25118 Atherosclerotic heart disease of native coronary artery with other forms of angina pectoris: Secondary | ICD-10-CM | POA: Diagnosis present

## 2022-11-25 DIAGNOSIS — K219 Gastro-esophageal reflux disease without esophagitis: Secondary | ICD-10-CM | POA: Diagnosis not present

## 2022-11-25 DIAGNOSIS — N1831 Chronic kidney disease, stage 3a: Secondary | ICD-10-CM | POA: Diagnosis not present

## 2022-11-25 DIAGNOSIS — I2584 Coronary atherosclerosis due to calcified coronary lesion: Secondary | ICD-10-CM | POA: Insufficient documentation

## 2022-11-25 DIAGNOSIS — G8929 Other chronic pain: Secondary | ICD-10-CM | POA: Insufficient documentation

## 2022-11-25 DIAGNOSIS — R0609 Other forms of dyspnea: Secondary | ICD-10-CM | POA: Insufficient documentation

## 2022-11-25 DIAGNOSIS — Z79899 Other long term (current) drug therapy: Secondary | ICD-10-CM | POA: Insufficient documentation

## 2022-11-25 DIAGNOSIS — Z9861 Coronary angioplasty status: Secondary | ICD-10-CM

## 2022-11-25 HISTORY — PX: LEFT HEART CATH AND CORS/GRAFTS ANGIOGRAPHY: CATH118250

## 2022-11-25 HISTORY — PX: CORONARY BALLOON ANGIOPLASTY: CATH118233

## 2022-11-25 LAB — POCT ACTIVATED CLOTTING TIME
Activated Clotting Time: 250 s
Activated Clotting Time: 305 s

## 2022-11-25 SURGERY — LEFT HEART CATH AND CORS/GRAFTS ANGIOGRAPHY
Anesthesia: LOCAL

## 2022-11-25 MED ORDER — SODIUM CHLORIDE 0.9 % WEIGHT BASED INFUSION
3.0000 mL/kg/h | INTRAVENOUS | Status: AC
  Administered 2022-11-25: 3 mL/kg/h via INTRAVENOUS

## 2022-11-25 MED ORDER — IOHEXOL 350 MG/ML SOLN
INTRAVENOUS | Status: DC | PRN
  Administered 2022-11-25: 70 mL via INTRA_ARTERIAL

## 2022-11-25 MED ORDER — HEPARIN SODIUM (PORCINE) 1000 UNIT/ML IJ SOLN
INTRAMUSCULAR | Status: AC
  Filled 2022-11-25: qty 10

## 2022-11-25 MED ORDER — NITROGLYCERIN 1 MG/10 ML FOR IR/CATH LAB
INTRA_ARTERIAL | Status: AC
  Filled 2022-11-25: qty 10

## 2022-11-25 MED ORDER — MIDAZOLAM HCL 2 MG/2ML IJ SOLN
INTRAMUSCULAR | Status: AC
  Filled 2022-11-25: qty 2

## 2022-11-25 MED ORDER — VERAPAMIL HCL 2.5 MG/ML IV SOLN
INTRAVENOUS | Status: DC | PRN
  Administered 2022-11-25: 10 mL via INTRA_ARTERIAL

## 2022-11-25 MED ORDER — MIDAZOLAM HCL 2 MG/2ML IJ SOLN
INTRAMUSCULAR | Status: DC | PRN
  Administered 2022-11-25 (×2): 1 mg via INTRAVENOUS

## 2022-11-25 MED ORDER — VERAPAMIL HCL 2.5 MG/ML IV SOLN
INTRAVENOUS | Status: AC
  Filled 2022-11-25: qty 2

## 2022-11-25 MED ORDER — TRIAMTERENE-HCTZ 37.5-25 MG PO TABS
0.5000 | ORAL_TABLET | ORAL | Status: DC
Start: 2022-11-25 — End: 2022-12-02

## 2022-11-25 MED ORDER — FENTANYL CITRATE (PF) 100 MCG/2ML IJ SOLN
INTRAMUSCULAR | Status: DC | PRN
  Administered 2022-11-25 (×3): 25 ug via INTRAVENOUS

## 2022-11-25 MED ORDER — CLOPIDOGREL BISULFATE 75 MG PO TABS: 75.0000 mg | ORAL_TABLET | Freq: Every day | ORAL | 0 refills | Status: DC

## 2022-11-25 MED ORDER — ASPIRIN 81 MG PO CHEW
81.0000 mg | CHEWABLE_TABLET | ORAL | Status: AC
  Administered 2022-11-25: 81 mg via ORAL
  Filled 2022-11-25: qty 1

## 2022-11-25 MED ORDER — SODIUM CHLORIDE 0.9 % WEIGHT BASED INFUSION: 1.0000 mL/kg/h | INTRAVENOUS | Status: DC

## 2022-11-25 MED ORDER — LIDOCAINE HCL (PF) 1 % IJ SOLN
INTRAMUSCULAR | Status: AC
  Filled 2022-11-25: qty 30

## 2022-11-25 MED ORDER — HEPARIN SODIUM (PORCINE) 1000 UNIT/ML IJ SOLN
INTRAMUSCULAR | Status: DC | PRN
  Administered 2022-11-25 (×2): 4000 [IU] via INTRAVENOUS
  Administered 2022-11-25: 3000 [IU] via INTRAVENOUS

## 2022-11-25 MED ORDER — CLOPIDOGREL BISULFATE 300 MG PO TABS
ORAL_TABLET | ORAL | Status: AC
  Filled 2022-11-25: qty 2

## 2022-11-25 MED ORDER — FAMOTIDINE IN NACL 20-0.9 MG/50ML-% IV SOLN
INTRAVENOUS | Status: DC | PRN
  Administered 2022-11-25: 20 mg via INTRAVENOUS

## 2022-11-25 MED ORDER — SODIUM CHLORIDE 0.9% FLUSH: 3.0000 mL | Freq: Two times a day (BID) | INTRAVENOUS | Status: DC

## 2022-11-25 MED ORDER — FAMOTIDINE IN NACL 20-0.9 MG/50ML-% IV SOLN
INTRAVENOUS | Status: AC
  Filled 2022-11-25: qty 50

## 2022-11-25 MED ORDER — ACETAMINOPHEN 325 MG PO TABS: 650.0000 mg | ORAL_TABLET | ORAL | Status: DC | PRN

## 2022-11-25 MED ORDER — HYDRALAZINE HCL 20 MG/ML IJ SOLN: 5.0000 mg | INTRAMUSCULAR | Status: DC | PRN

## 2022-11-25 MED ORDER — METHYLPREDNISOLONE SODIUM SUCC 125 MG IJ SOLR
INTRAMUSCULAR | Status: AC
  Administered 2022-11-25: 125 mg via INTRAVENOUS
  Filled 2022-11-25: qty 2

## 2022-11-25 MED ORDER — LIDOCAINE HCL (PF) 1 % IJ SOLN
INTRAMUSCULAR | Status: DC | PRN
  Administered 2022-11-25: 2 mL

## 2022-11-25 MED ORDER — HEPARIN (PORCINE) IN NACL 1000-0.9 UT/500ML-% IV SOLN
INTRAVENOUS | Status: DC | PRN
  Administered 2022-11-25 (×2): 500 mL

## 2022-11-25 MED ORDER — DIPHENHYDRAMINE HCL 50 MG/ML IJ SOLN
25.0000 mg | Freq: Once | INTRAMUSCULAR | Status: AC
  Administered 2022-11-25: 25 mg via INTRAVENOUS

## 2022-11-25 MED ORDER — CLOPIDOGREL BISULFATE 300 MG PO TABS
ORAL_TABLET | ORAL | Status: DC | PRN
  Administered 2022-11-25: 600 mg via ORAL

## 2022-11-25 MED ORDER — SODIUM CHLORIDE 0.9 % IV SOLN: 250.0000 mL | INTRAVENOUS | Status: DC | PRN

## 2022-11-25 MED ORDER — NITROGLYCERIN 1 MG/10 ML FOR IR/CATH LAB
INTRA_ARTERIAL | Status: DC | PRN
  Administered 2022-11-25: 200 ug via INTRACORONARY
  Administered 2022-11-25: 400 ug via INTRACORONARY

## 2022-11-25 MED ORDER — SODIUM CHLORIDE 0.9% FLUSH: 3.0000 mL | INTRAVENOUS | Status: DC | PRN

## 2022-11-25 MED ORDER — FENTANYL CITRATE (PF) 100 MCG/2ML IJ SOLN
INTRAMUSCULAR | Status: AC
  Filled 2022-11-25: qty 2

## 2022-11-25 MED ORDER — ONDANSETRON HCL 4 MG/2ML IJ SOLN: 4.0000 mg | Freq: Four times a day (QID) | INTRAMUSCULAR | Status: DC | PRN

## 2022-11-25 MED ORDER — METHYLPREDNISOLONE SODIUM SUCC 125 MG IJ SOLR: 125.0000 mg | Freq: Once | INTRAMUSCULAR | Status: AC

## 2022-11-25 MED ORDER — DIPHENHYDRAMINE HCL 50 MG/ML IJ SOLN
INTRAMUSCULAR | Status: AC
  Filled 2022-11-25: qty 1

## 2022-11-25 SURGICAL SUPPLY — 24 items
BALLN EMERGE MR 2.5X15 (BALLOONS) ×1
BALLN WOLVERINE 2.75X10 (BALLOONS) ×1
BALLOON EMERGE MR 2.5X15 (BALLOONS) IMPLANT
BALLOON WOLVERINE 2.75X10 (BALLOONS) IMPLANT
CATH INFINITI 5 FR JL3.5 (CATHETERS) IMPLANT
CATH INFINITI AMBI 5FR TG (CATHETERS) IMPLANT
CATH INFINITI JR4 5F (CATHETERS) IMPLANT
CATH LAUNCHER 6FR AL1 (CATHETERS) IMPLANT
CATH LAUNCHER 6FR EBU 3 (CATHETERS) IMPLANT
CATH LAUNCHER 6FR EBU3.5 (CATHETERS) IMPLANT
CATH LAUNCHER 6FR JL4 (CATHETERS) IMPLANT
CATH TELESCOPE 6F GEC (CATHETERS) IMPLANT
CATHETER LAUNCHER 6FR AL1 (CATHETERS) ×1
DEVICE RAD COMP TR BAND LRG (VASCULAR PRODUCTS) IMPLANT
ELECT DEFIB PAD ADLT CADENCE (PAD) IMPLANT
GLIDESHEATH SLEND A-KIT 6F 22G (SHEATH) IMPLANT
GUIDEWIRE INQWIRE 1.5J.035X260 (WIRE) IMPLANT
INQWIRE 1.5J .035X260CM (WIRE) ×1
KIT ENCORE 26 ADVANTAGE (KITS) IMPLANT
KIT SYRINGE INJ CVI SPIKEX1 (MISCELLANEOUS) IMPLANT
PACK CARDIAC CATHETERIZATION (CUSTOM PROCEDURE TRAY) ×1 IMPLANT
SET ATX-X65L (MISCELLANEOUS) IMPLANT
TUBING CIL FLEX 10 FLL-RA (TUBING) IMPLANT
WIRE COUGAR XT STRL 190CM (WIRE) IMPLANT

## 2022-11-25 NOTE — Progress Notes (Signed)
CARDIAC REHAB PHASE I     Post PTCA education including site care, restrictions, risk factors, exercise guidelines, NTG use, antiplatelet therapy importance, heart health diet and CRP2 reviewed. All questions and concerns addressed. Will refer to [facility] for CRP2. Plan for home later today.    [id number] Woodroe Chen, RN BSN 11/25/2022 12:58 PM

## 2022-11-25 NOTE — Interval H&P Note (Signed)
History and Physical Interval Note:  11/25/2022 7:49 AM  [name]  has presented today for surgery, with the diagnosis of cad.  The various methods of treatment have been discussed with the patient and family. After consideration of risks, benefits and other options for treatment, the patient has consented to  Procedure(s): LEFT HEART CATH AND CORS/GRAFTS ANGIOGRAPHY (N/A) and possible angioplasty as a surgical intervention.  The patient's history has been reviewed, patient examined, no change in status, stable for surgery.  I have reviewed the patient's chart and labs.  Questions were answered to the patient's satisfaction.     Yates Decamp

## 2022-11-26 ENCOUNTER — Encounter (HOSPITAL_COMMUNITY): Payer: Self-pay | Admitting: Cardiology

## 2022-11-27 ENCOUNTER — Other Ambulatory Visit: Payer: Self-pay

## 2022-11-27 ENCOUNTER — Inpatient Hospital Stay (HOSPITAL_COMMUNITY)
Admission: EM | Admit: 2022-11-27 | Discharge: 2022-12-02 | DRG: 644 | Disposition: A | Discharge status: Home Health | Payer: PPO | Attending: Internal Medicine | Admitting: Internal Medicine

## 2022-11-27 ENCOUNTER — Telehealth (HOSPITAL_COMMUNITY): Payer: Self-pay

## 2022-11-27 ENCOUNTER — Encounter: Payer: Self-pay | Admitting: Physician Assistant

## 2022-11-27 ENCOUNTER — Encounter (HOSPITAL_COMMUNITY): Payer: Self-pay

## 2022-11-27 ENCOUNTER — Ambulatory Visit
Admission: EM | Admit: 2022-11-27 | Discharge: 2022-11-27 | Disposition: A | Discharge status: Home / Self Care | Payer: PPO | Attending: Physician Assistant | Admitting: Physician Assistant

## 2022-11-27 ENCOUNTER — Emergency Department (HOSPITAL_COMMUNITY): Payer: PPO

## 2022-11-27 DIAGNOSIS — Z880 Allergy status to penicillin: Secondary | ICD-10-CM

## 2022-11-27 DIAGNOSIS — R7989 Other specified abnormal findings of blood chemistry: Secondary | ICD-10-CM | POA: Diagnosis present

## 2022-11-27 DIAGNOSIS — H547 Unspecified visual loss: Secondary | ICD-10-CM | POA: Diagnosis present

## 2022-11-27 DIAGNOSIS — T82855D Stenosis of coronary artery stent, subsequent encounter: Secondary | ICD-10-CM

## 2022-11-27 DIAGNOSIS — K219 Gastro-esophageal reflux disease without esophagitis: Secondary | ICD-10-CM | POA: Diagnosis present

## 2022-11-27 DIAGNOSIS — I2510 Atherosclerotic heart disease of native coronary artery without angina pectoris: Secondary | ICD-10-CM

## 2022-11-27 DIAGNOSIS — R519 Headache, unspecified: Secondary | ICD-10-CM

## 2022-11-27 DIAGNOSIS — I350 Nonrheumatic aortic (valve) stenosis: Secondary | ICD-10-CM | POA: Diagnosis present

## 2022-11-27 DIAGNOSIS — R531 Weakness: Secondary | ICD-10-CM | POA: Diagnosis not present

## 2022-11-27 DIAGNOSIS — R55 Syncope and collapse: Secondary | ICD-10-CM | POA: Diagnosis present

## 2022-11-27 DIAGNOSIS — Z8249 Family history of ischemic heart disease and other diseases of the circulatory system: Secondary | ICD-10-CM

## 2022-11-27 DIAGNOSIS — E2740 Unspecified adrenocortical insufficiency: Secondary | ICD-10-CM | POA: Diagnosis not present

## 2022-11-27 DIAGNOSIS — J329 Chronic sinusitis, unspecified: Secondary | ICD-10-CM | POA: Diagnosis present

## 2022-11-27 DIAGNOSIS — I6782 Cerebral ischemia: Secondary | ICD-10-CM | POA: Diagnosis not present

## 2022-11-27 DIAGNOSIS — Z9842 Cataract extraction status, left eye: Secondary | ICD-10-CM

## 2022-11-27 DIAGNOSIS — Z85828 Personal history of other malignant neoplasm of skin: Secondary | ICD-10-CM

## 2022-11-27 DIAGNOSIS — Z800 Family history of malignant neoplasm of digestive organs: Secondary | ICD-10-CM

## 2022-11-27 DIAGNOSIS — E782 Mixed hyperlipidemia: Secondary | ICD-10-CM | POA: Diagnosis present

## 2022-11-27 DIAGNOSIS — Z86010 Personal history of colonic polyps: Secondary | ICD-10-CM

## 2022-11-27 DIAGNOSIS — I130 Hypertensive heart and chronic kidney disease with heart failure and stage 1 through stage 4 chronic kidney disease, or unspecified chronic kidney disease: Secondary | ICD-10-CM | POA: Diagnosis present

## 2022-11-27 DIAGNOSIS — Z8701 Personal history of pneumonia (recurrent): Secondary | ICD-10-CM

## 2022-11-27 DIAGNOSIS — I951 Orthostatic hypotension: Secondary | ICD-10-CM | POA: Diagnosis present

## 2022-11-27 DIAGNOSIS — Z7902 Long term (current) use of antithrombotics/antiplatelets: Secondary | ICD-10-CM

## 2022-11-27 DIAGNOSIS — N1831 Chronic kidney disease, stage 3a: Secondary | ICD-10-CM | POA: Diagnosis present

## 2022-11-27 DIAGNOSIS — Z888 Allergy status to other drugs, medicaments and biological substances status: Secondary | ICD-10-CM

## 2022-11-27 DIAGNOSIS — Z9841 Cataract extraction status, right eye: Secondary | ICD-10-CM

## 2022-11-27 DIAGNOSIS — E871 Hypo-osmolality and hyponatremia: Secondary | ICD-10-CM | POA: Diagnosis present

## 2022-11-27 DIAGNOSIS — I10 Essential (primary) hypertension: Secondary | ICD-10-CM | POA: Insufficient documentation

## 2022-11-27 DIAGNOSIS — Z91041 Radiographic dye allergy status: Secondary | ICD-10-CM

## 2022-11-27 DIAGNOSIS — N179 Acute kidney failure, unspecified: Secondary | ICD-10-CM

## 2022-11-27 DIAGNOSIS — I5032 Chronic diastolic (congestive) heart failure: Secondary | ICD-10-CM | POA: Diagnosis present

## 2022-11-27 DIAGNOSIS — Z96642 Presence of left artificial hip joint: Secondary | ICD-10-CM | POA: Diagnosis present

## 2022-11-27 DIAGNOSIS — Z7982 Long term (current) use of aspirin: Secondary | ICD-10-CM

## 2022-11-27 DIAGNOSIS — Y831 Surgical operation with implant of artificial internal device as the cause of abnormal reaction of the patient, or of later complication, without mention of misadventure at the time of the procedure: Secondary | ICD-10-CM | POA: Diagnosis present

## 2022-11-27 DIAGNOSIS — Z951 Presence of aortocoronary bypass graft: Secondary | ICD-10-CM

## 2022-11-27 DIAGNOSIS — Z885 Allergy status to narcotic agent status: Secondary | ICD-10-CM

## 2022-11-27 DIAGNOSIS — I25118 Atherosclerotic heart disease of native coronary artery with other forms of angina pectoris: Secondary | ICD-10-CM | POA: Diagnosis present

## 2022-11-27 DIAGNOSIS — I25718 Atherosclerosis of autologous vein coronary artery bypass graft(s) with other forms of angina pectoris: Secondary | ICD-10-CM | POA: Diagnosis present

## 2022-11-27 DIAGNOSIS — Z9861 Coronary angioplasty status: Secondary | ICD-10-CM

## 2022-11-27 DIAGNOSIS — Z79899 Other long term (current) drug therapy: Secondary | ICD-10-CM

## 2022-11-27 DIAGNOSIS — I272 Pulmonary hypertension, unspecified: Secondary | ICD-10-CM | POA: Diagnosis present

## 2022-11-27 DIAGNOSIS — Z961 Presence of intraocular lens: Secondary | ICD-10-CM | POA: Diagnosis present

## 2022-11-27 DIAGNOSIS — Z1152 Encounter for screening for COVID-19: Secondary | ICD-10-CM

## 2022-11-27 DIAGNOSIS — Z95818 Presence of other cardiac implants and grafts: Secondary | ICD-10-CM

## 2022-11-27 DIAGNOSIS — D509 Iron deficiency anemia, unspecified: Secondary | ICD-10-CM | POA: Diagnosis present

## 2022-11-27 LAB — URINALYSIS, ROUTINE W REFLEX MICROSCOPIC
Bilirubin Urine: NEGATIVE
Glucose, UA: 50 mg/dL — AB
Hgb urine dipstick: NEGATIVE
Ketones, ur: NEGATIVE mg/dL
Leukocytes,Ua: NEGATIVE
Nitrite: NEGATIVE
Protein, ur: NEGATIVE mg/dL
Specific Gravity, Urine: 1.014 (ref 1.005–1.030)
pH: 6 (ref 5.0–8.0)

## 2022-11-27 LAB — RESP PANEL BY RT-PCR (RSV, FLU A&B, COVID)  RVPGX2
Influenza A by PCR: NEGATIVE
Influenza B by PCR: NEGATIVE
Resp Syncytial Virus by PCR: NEGATIVE
SARS Coronavirus 2 by RT PCR: NEGATIVE

## 2022-11-27 LAB — COMPREHENSIVE METABOLIC PANEL
ALT: 33 U/L (ref 0–44)
AST: 27 U/L (ref 15–41)
Albumin: 3.4 g/dL — ABNORMAL LOW (ref 3.5–5.0)
Alkaline Phosphatase: 64 U/L (ref 38–126)
Anion gap: 9 (ref 5–15)
BUN: 30 mg/dL — ABNORMAL HIGH (ref 8–23)
CO2: 24 mmol/L (ref 22–32)
Calcium: 8.9 mg/dL (ref 8.9–10.3)
Chloride: 95 mmol/L — ABNORMAL LOW (ref 98–111)
Creatinine, Ser: 1.31 mg/dL — ABNORMAL HIGH (ref 0.61–1.24)
GFR, Estimated: 56 mL/min — ABNORMAL LOW (ref >60)
Glucose, Bld: 115 mg/dL — ABNORMAL HIGH (ref 70–99)
Potassium: 3.9 mmol/L (ref 3.5–5.1)
Sodium: 128 mmol/L — ABNORMAL LOW (ref 135–145)
Total Bilirubin: 0.9 mg/dL (ref 0.3–1.2)
Total Protein: 6.3 g/dL — ABNORMAL LOW (ref 6.5–8.1)

## 2022-11-27 LAB — CBC WITH DIFFERENTIAL/PLATELET
Abs Immature Granulocytes: 0.01 10*3/uL (ref 0.00–0.07)
Basophils Absolute: 0 10*3/uL (ref 0.0–0.1)
Basophils Relative: 0 %
Eosinophils Absolute: 0.2 10*3/uL (ref 0.0–0.5)
Eosinophils Relative: 2 %
HCT: 24.8 % — ABNORMAL LOW (ref 39.0–52.0)
Hemoglobin: 8.1 g/dL — ABNORMAL LOW (ref 13.0–17.0)
Immature Granulocytes: 0 %
Lymphocytes Relative: 45 %
Lymphs Abs: 3.4 10*3/uL (ref 0.7–4.0)
MCH: 28 pg (ref 26.0–34.0)
MCHC: 32.7 g/dL (ref 30.0–36.0)
MCV: 85.8 fL (ref 80.0–100.0)
Monocytes Absolute: 0.7 10*3/uL (ref 0.1–1.0)
Monocytes Relative: 9 %
Neutro Abs: 3.2 10*3/uL (ref 1.7–7.7)
Neutrophils Relative %: 44 %
Platelets: 240 10*3/uL (ref 150–400)
RBC: 2.89 MIL/uL — ABNORMAL LOW (ref 4.22–5.81)
RDW: 17.3 % — ABNORMAL HIGH (ref 11.5–15.5)
WBC: 7.5 10*3/uL (ref 4.0–10.5)
nRBC: 0 % (ref 0.0–0.2)

## 2022-11-27 LAB — PROTIME-INR
INR: 1.1 (ref 0.8–1.2)
Prothrombin Time: 13.9 seconds (ref 11.4–15.2)

## 2022-11-27 LAB — TROPONIN I (HIGH SENSITIVITY)
Troponin I (High Sensitivity): 99 ng/L — ABNORMAL HIGH (ref <18)
Troponin I (High Sensitivity): 114 ng/L (ref <18)

## 2022-11-27 LAB — POC OCCULT BLOOD, ED: Fecal Occult Bld: NEGATIVE

## 2022-11-27 MED ORDER — SODIUM CHLORIDE 0.9 % IV BOLUS
1000.0000 mL | Freq: Once | INTRAVENOUS | Status: AC
  Administered 2022-11-27: 1000 mL via INTRAVENOUS

## 2022-11-27 NOTE — ED Provider Notes (Signed)
[facility]    CSN: [id number] Arrival date & time: 11/27/22  1714      History   Chief Complaint Chief Complaint  Patient presents with   Dizziness    HPI [name] is a 77 y.o. male.   Patient here today for evaluation of dizziness, vision changes after having a cardiac cath 4 days ago.  He denies any vomiting.  He has had headache.  The history is provided by the patient.  Dizziness Associated symptoms: chest pain and shortness of breath   Associated symptoms: no vomiting     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   Noted on EKG   Left axis deviation 05/2018   Noted on EKG   Lipoma 04/20/2018   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   Loop Biotronik Biomonitor III for syncope and collapse 04/09/2021 04/09/2021   LVH (left ventricular hypertrophy) 05/2018   Noted on EKG   Phimosis    Pneumonia    Syncope and collapse    Tuberculosis 1956   Wears glasses    Wheezing     Patient Active Problem List   Diagnosis Date Noted   Syncope 11/29/2022   Pre-syncope 11/28/2022   Elevated troponin 11/28/2022   CKD stage 3a, GFR 45-59 ml/min (HCC) 11/28/2022   CAD S/P percutaneous coronary angioplasty 11/28/2022   Coronary artery disease of bypass graft of native heart with stable angina pectoris (HCC) 11/25/2022   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   Status post coronary artery stent placement     Post PTCA 12/24/2021   Hx of CABG    Syncope and collapse 04/09/2021   Loop Biotronik Biomonitor III for syncope and collapse 04/09/2021 04/09/2021   S/P lumbar laminectomy 07/13/2019   Chest pain 03/16/2016   Constipation 03/16/2016   HTN (hypertension)    Hyperlipidemia    Coronary artery disease of native artery of native heart with stable angina pectoris (HCC)    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    Past Surgical History:  Procedure Laterality Date   ANGIOPLASTY  2010   3 stents placed   BACK SURGERY     CARDIAC CATHETERIZATION     CATARACT EXTRACTION W/ INTRAOCULAR LENS  IMPLANT, BILATERAL  2012   bilateral   CIRCUMCISION N/A 08/30/2018   Procedure: CIRCUMCISION ADULT;  Surgeon: Ihor Gully, MD;  Location: [facility];  Service: Urology;  Laterality: N/A;   COLONOSCOPY  2013, 2008   CORONARY ANGIOPLASTY     CORONARY ARTERY BYPASS GRAFT  2006   x4   CORONARY ATHERECTOMY N/A 12/24/2021   Procedure: CORONARY ATHERECTOMY;  Surgeon: Yates Decamp, MD;  Location: [facility] INVASIVE CV LAB;  Service: Cardiovascular;  Laterality: N/A;   CORONARY BALLOON ANGIOPLASTY N/A 11/25/2022   Procedure: CORONARY BALLOON ANGIOPLASTY;  Surgeon: Yates Decamp, MD;  Location: [facility] INVASIVE CV LAB;  Service: Cardiovascular;  Laterality: N/A;   CORONARY STENT INTERVENTION N/A 12/24/2021   Procedure: CORONARY STENT INTERVENTION;  Surgeon: Yates Decamp, MD;  Location: [facility] INVASIVE CV LAB;  Service: Cardiovascular;  Laterality: N/A;   ESOPHAGOGASTRODUODENOSCOPY  10/2011   EYE SURGERY     KNEE ARTHROSCOPY  2001   LEFT HEART CATH AND CORS/GRAFTS ANGIOGRAPHY N/A 12/24/2021   Procedure: LEFT HEART CATH AND CORS/GRAFTS ANGIOGRAPHY;  Surgeon: Yates Decamp, MD;  Location: [facility] INVASIVE CV LAB;  Service: Cardiovascular;  Laterality: N/A;   LEFT HEART CATH AND CORS/GRAFTS ANGIOGRAPHY N/A 11/25/2022   Procedure: LEFT HEART CATH AND CORS/GRAFTS ANGIOGRAPHY;  Surgeon: Yates Decamp, MD;  Location:  [facility] INVASIVE CV LAB;  Service: Cardiovascular;  Laterality: N/A;   LUMBAR DISC SURGERY  1998   LUMBAR LAMINECTOMY/DECOMPRESSION MICRODISCECTOMY N/A 07/13/2019   Procedure: Laminectomy and Foraminotomy - Lumbar Two-Lumbar Three - Lumbar Three-Lumbar Four;  Surgeon: Tia Alert, MD;  Location: [facility] OR;  Service: Neurosurgery;  Laterality: N/A;  Laminectomy and Foraminotomy - Lumbar Two-Lumbar Three - Lumbar Three-Lumbar Four   ROTATOR CUFF REPAIR Left 05/24/2020   TENDON RECONSTRUCTION Right 10/27/2013   Procedure: RIGHT OPEN TRICEPS REPAIR;  Surgeon: Sheral Apley, MD;  Location: [facility];  Service: Orthopedics;  Laterality: Right;   TOTAL HIP ARTHROPLASTY Left 08/05/2022   Procedure: TOTAL HIP ARTHROPLASTY;  Surgeon: Teryl Lucy, MD;  Location: [facility] ORS;  Service: Orthopedics;  Laterality: Left;       Home Medications    Prior to Admission medications   Medication Sig Start Date End Date Taking? Authorizing Provider  aspirin 81 MG chewable tablet Chew 81 mg by mouth every evening.   Yes [provider]  clopidogrel (PLAVIX) 75 MG tablet Take 1 tablet (75 mg total) by mouth daily. 11/25/22  Yes Yates Decamp, MD  acetaminophen (TYLENOL) 500 MG tablet Take 1,000 mg by mouth every 6 (six) hours as needed for moderate pain.    [provider]  Calcium Carbonate Antacid (TUMS ULTRA PO) Take 2 tablets by mouth daily as needed (acid reflux/indigestion.).    [provider]  Coenzyme Q10 (COQ10 PO) Take 300 mg by mouth 2 (two) times daily.    [provider]  Cyanocobalamin (VITAMIN B-12) 5000 MCG TBDP Take 5,000 mcg by mouth every evening. 12/07/20   [provider]  ezetimibe (ZETIA) 10 MG tablet TAKE 1/2 TABLET BY MOUTH EVERY EVENING 08/21/21   Yates Decamp, MD  fish oil-omega-3 fatty acids 1000 MG capsule Take 1 g by mouth in the morning and at bedtime.    [provider]  fluconazole (DIFLUCAN) 200 MG tablet Take 200 mg by mouth  every Saturday. 05/14/22   [provider]  hydrALAZINE (APRESOLINE) 25 MG tablet Take 1 tablet (25 mg total) by mouth 3 (three) times daily as needed (For SBP >140 mm Hg). 05/15/22 05/10/23  Yates Decamp, MD  losartan (COZAAR) 25 MG tablet TAKE 1/2 TABLET BY MOUTH EVERY EVENING 08/21/21   Yates Decamp, MD  metoprolol succinate (TOPROL-XL) 25 MG 24 hr tablet TAKE 1/2 TABLET BY MOUTH EVERY MORNING 11/24/22   Yates Decamp, MD  Multiple Vitamin (MULTIVITAMIN) tablet Take 1 tablet by mouth in the morning. Seniors    [provider]  nitroGLYCERIN (NITROSTAT) 0.4 MG SL tablet TAKE 1 TABLET EVERY 5 MINUTES AS NEEDED FOR CHEST PAIN. 11/13/22   Yates Decamp, MD  pantoprazole (PROTONIX) 40 MG tablet Take  1 tablet (40 mg total) by mouth daily. 03/17/16   Elgergawy, Leana Roe, MD  Polyethyl Glycol-Propyl Glycol (SYSTANE) 0.4-0.3 % GEL ophthalmic gel Place 1 application into both eyes at bedtime.    [provider]  Propylene Glycol (SYSTANE BALANCE) 0.6 % SOLN Place 1 drop into both eyes 3 (three) times daily as needed (dry/irritated eyes.).    [provider]  pyridoxine (B-6) 100 MG tablet Take 100 mg by mouth every evening.    [provider]  rosuvastatin (CRESTOR) 20 MG tablet TAKE ONE TABLET BY MOUTH EVERYDAY AT BEDTIME 05/06/22   Yates Decamp, MD  traMADol (ULTRAM) 50 MG tablet Take 50 mg by mouth 3 (three) times daily as needed for moderate pain. 10/20/22   [provider]  triamterene-hydrochlorothiazide (MAXZIDE-25) 37.5-25 MG tablet Take 0.5 tablets by mouth every morning. 11/25/22   Yates Decamp, MD  vitamin C (ASCORBIC ACID) 500 MG tablet Take 500 mg by mouth daily.    [provider]    Family History Family History  Problem Relation Age of Onset   Colon cancer Mother 75   Hypertension Mother    Colon cancer Father 19   Heart attack Brother    Heart failure Brother    Stomach cancer Neg Hx     Social History Social History   Tobacco Use   Smoking  status: Never   Smokeless tobacco: Never  Vaping Use   Vaping status: Never Used  Substance Use Topics   Alcohol use: No   Drug use: No     Allergies   Codeine, Guaifenesin & derivatives, Terbinafine and related, Iodinated contrast media, and Penicillins   Review of Systems Review of Systems  Constitutional:  Negative for chills and fever.  Eyes:  Positive for visual disturbance. Negative for discharge and redness.  Respiratory:  Positive for shortness of breath.   Cardiovascular:  Positive for chest pain.  Gastrointestinal:  Negative for vomiting.  Neurological:  Positive for dizziness.     Physical Exam Triage Vital Signs ED Triage Vitals  Encounter Vitals Group     BP 11/27/22 1722 113/65     Systolic BP Percentile --      Diastolic BP Percentile --      Pulse Rate 11/27/22 1722 74     Resp 11/27/22 1722 (!) 22     Temp 11/27/22 1722 98 F (36.7 C)     Temp Source 11/27/22 1722 Oral     SpO2 11/27/22 1722 97 %     Weight 11/27/22 1724 179 lb 14.3 oz (81.6 kg)     Height 11/27/22 1724 5\' 7"  (1.702 m)     Head Circumference --      Peak Flow --      Pain Score 11/27/22 1723 2     Pain Loc --      Pain Education --      Exclude from Growth Chart --    No data found.  Updated Vital Signs BP 113/65 (BP Location: Left Arm)   Pulse 78   Temp 98 F (36.7 C) (Oral)   Resp 20   Ht 5\' 7"  (1.702 m)   Wt 179 lb 14.3 oz (81.6 kg)   SpO2 98%   BMI 28.18 kg/m      Physical Exam Vitals and nursing note reviewed.  Constitutional:      General: He is not in acute distress.    Appearance: Normal appearance. He is not ill-appearing.  HENT:  Head: Normocephalic and atraumatic.     Nose: Nose normal. No congestion or rhinorrhea.  Eyes:     Conjunctiva/sclera: Conjunctivae normal.  Cardiovascular:     Rate and Rhythm: Normal rate and regular rhythm.  Pulmonary:     Effort: Pulmonary effort is normal. No respiratory distress.     Breath sounds: Normal breath  sounds. No wheezing, rhonchi or rales.  Neurological:     Mental Status: He is alert.  Psychiatric:        Mood and Affect: Mood normal.        Behavior: Behavior normal.        Thought Content: Thought content normal.      UC Treatments / Results  Labs (all labs ordered are listed, but only abnormal results are displayed) Labs Reviewed - No data to display  EKG   Radiology ECHOCARDIOGRAM COMPLETE  Result Date: 11/30/2022    ECHOCARDIOGRAM REPORT   Patient Name:   [name] Date of Exam: 11/30/2022 Medical Rec #:  [id number]       Height:       67.0 in Accession #:    [id number]      Weight:       177.9 lb Date of Birth:  09/08/1945       BSA:          1.924 m Patient Age:    77 years        BP:           144/77 mmHg Patient Gender: M               HR:           57 bpm. Exam Location:  Inpatient Procedure: 2D Echo, Cardiac Doppler and Color Doppler Indications:    CAD of native vessel  History:        Patient has no prior history of Echocardiogram examinations.                 Prior CABG, chronic kidney disease; Risk Factors:Hypertension                 and Dyslipidemia.  Sonographer:    Delcie Roch RDCS Referring Phys: [id number] Yates Decamp IMPRESSIONS  1. Left ventricular ejection fraction, by estimation, is 60 to 65%. The left ventricle has normal function. The left ventricle has no regional wall motion abnormalities. There is mild concentric left ventricular hypertrophy. Left ventricular diastolic parameters are consistent with Grade I diastolic dysfunction (impaired relaxation).  2. Right ventricular systolic function is normal. The right ventricular size is normal. There is normal pulmonary artery systolic pressure. The estimated right ventricular systolic pressure is 25.8 mmHg.  3. Left atrial size was mildly dilated.  4. The mitral valve is grossly normal. Trivial mitral valve regurgitation.  5. The aortic valve is functionally bicuspid. There is moderate to severe calcification of  the aortic valve. Aortic valve regurgitation is not visualized. Mild aortic valve stenosis. Aortic valve area, by VTI measures 1.49 cm. Aortic valve mean gradient measures 11.0 mmHg. Aortic valve Vmax measures 2.27 m/s. Dimentionless index 0.43.  6. The inferior vena cava is normal in size with greater than 50% respiratory variability, suggesting right atrial pressure of 3 mmHg. Comparison(s): No prior Echocardiogram. FINDINGS  Left Ventricle: Left ventricular ejection fraction, by estimation, is 60 to 65%. The left ventricle has normal function. The left ventricle has no regional wall motion abnormalities. The left ventricular internal cavity size was normal in size.  There is  mild concentric left ventricular hypertrophy. Left ventricular diastolic parameters are consistent with Grade I diastolic dysfunction (impaired relaxation). Right Ventricle: The right ventricular size is normal. No increase in right ventricular wall thickness. Right ventricular systolic function is normal. There is normal pulmonary artery systolic pressure. The tricuspid regurgitant velocity is 2.39 m/s, and  with an assumed right atrial pressure of 3 mmHg, the estimated right ventricular systolic pressure is 25.8 mmHg. Left Atrium: Left atrial size was mildly dilated. Right Atrium: Right atrial size was normal in size. Pericardium: There is no evidence of pericardial effusion. Mitral Valve: The mitral valve is grossly normal. Mild mitral annular calcification. Trivial mitral valve regurgitation. Tricuspid Valve: The tricuspid valve is grossly normal. Tricuspid valve regurgitation is mild. Aortic Valve: The aortic valve is bicuspid. There is moderate calcification of the aortic valve. There is moderate aortic valve annular calcification. Aortic valve regurgitation is not visualized. Mild aortic stenosis is present. Aortic valve mean gradient measures 11.0 mmHg. Aortic valve peak gradient measures 20.6 mmHg. Aortic valve area, by VTI measures  1.49 cm. Pulmonic Valve: The pulmonic valve was grossly normal. Pulmonic valve regurgitation is trivial. Aorta: The aortic root and ascending aorta are structurally normal, with no evidence of dilitation. Venous: The inferior vena cava is normal in size with greater than 50% respiratory variability, suggesting right atrial pressure of 3 mmHg. IAS/Shunts: No atrial level shunt detected by color flow Doppler.  LEFT VENTRICLE PLAX 2D LVIDd:         4.90 cm   Diastology LVIDs:         3.20 cm   LV e' medial:    6.53 cm/s LV PW:         1.20 cm   LV E/e' medial:  10.7 LV IVS:        1.20 cm   LV e' lateral:   9.68 cm/s LVOT diam:     2.10 cm   LV E/e' lateral: 7.2 LV SV:         78 LV SV Index:   40 LVOT Area:     3.46 cm  RIGHT VENTRICLE             IVC RV Basal diam:  2.90 cm     IVC diam: 1.10 cm RV S prime:     11.70 cm/s TAPSE (M-mode): 1.2 cm LEFT ATRIUM             Index        RIGHT ATRIUM           Index LA diam:        4.10 cm 2.13 cm/m   RA Area:     18.00 cm LA Vol (A2C):   79.4 ml 41.27 ml/m  RA Volume:   48.40 ml  25.15 ml/m LA Vol (A4C):   59.1 ml 30.72 ml/m LA Biplane Vol: 71.2 ml 37.00 ml/m  AORTIC VALVE AV Area (Vmax):    1.43 cm AV Area (Vmean):   1.36 cm AV Area (VTI):     1.49 cm AV Vmax:           227.00 cm/s AV Vmean:          151.000 cm/s AV VTI:            0.520 m AV Peak Grad:      20.6 mmHg AV Mean Grad:      11.0 mmHg LVOT Vmax:         93.45  cm/s LVOT Vmean:        59.400 cm/s LVOT VTI:          0.224 m LVOT/AV VTI ratio: 0.43  AORTA Ao Root diam: 3.40 cm Ao Asc diam:  3.50 cm MITRAL VALVE               TRICUSPID VALVE MV Area (PHT): 2.34 cm    TR Peak grad:   22.8 mmHg MV Decel Time: 324 msec    TR Vmax:        239.00 cm/s MV E velocity: 69.70 cm/s MV A velocity: 92.70 cm/s  SHUNTS MV E/A ratio:  0.75        Systemic VTI:  0.22 m                            Systemic Diam: 2.10 cm Nona Dell MD Electronically signed by Nona Dell MD Signature Date/Time: 11/30/2022/5:44:52  PM    Final    MR BRAIN WO CONTRAST  Result Date: 11/30/2022 CLINICAL DATA:  Syncope/presyncope, cerebrovascular cause suspected EXAM: MRI HEAD WITHOUT CONTRAST TECHNIQUE: Multiplanar, multiecho pulse sequences of the brain and surrounding structures were obtained without intravenous contrast. COMPARISON:  CT head 11/27/2022. FINDINGS: Brain: No acute infarction, hemorrhage, hydrocephalus, extra-axial collection or mass lesion. Mild for age scattered T2/FLAIR hyperintensities in the white matter, which are nonspecific but compatible with chronic microvascular ischemic disease. Vascular: Major arterial flow voids are maintained at the skull base. Skull and upper cervical spine: Normal marrow signal. Sinuses/Orbits: Right sphenoid sinus disease including mucosal thickening and secretions. The remaining sinuses are mostly clear. No acute orbital findings. Other: Trace left mastoid effusion. IMPRESSION: 1. No evidence of acute intracranial abnormality. 2. Right sphenoid sinus disease. Electronically Signed   By: Feliberto Harts M.D.   On: 11/30/2022 16:45    Procedures Procedures (including critical care time)  Medications Ordered in UC Medications - No data to display  Initial Impression / Assessment and Plan / UC Course  I have reviewed the triage vital signs and the nursing notes.  Pertinent labs & imaging results that were available during my care of the patient were reviewed by me and considered in my medical decision making (see chart for details).    Given current symptoms so close to recent cath recommended further evaluation in the emergency room.  Patient is agreeable to same.  [facility] to transport.  Final Clinical Impressions(s) / UC Diagnoses   Final diagnoses:  Acute nonintractable headache, unspecified headache type   Discharge Instructions   None    ED Prescriptions   None    PDMP not reviewed this encounter.   Tomi Bamberger, PA-C 12/01/22 1843

## 2022-11-27 NOTE — Telephone Encounter (Signed)
Per yellow sheet from CR Phase I, "has been to [facility] twice not interested in coming back."   Closed referral

## 2022-11-27 NOTE — ED Triage Notes (Signed)
Per EMS  Headache Started yesterday at 7am  Vision Loss Both eyes Started yesterday at 7am AMS Over the last few days Delayed responses EMS VS BP 140/70 HR 80's CBG 149 O2 sat 97%-98%  20G RAC

## 2022-11-27 NOTE — ED Notes (Signed)
Patient is being discharged from the Urgent Care and sent to the Emergency Department via Ambulance ([phone] phoned by staff) . Per R. Izola Price PA C, patient is in need of higher level of care due to Chest Pain and Recent Heart Cath/Procedure. Patient is aware and verbalizes understanding of plan of care.  Vitals:   11/27/22 1723 11/27/22 1733  BP:    Pulse:  78  Resp: 20 20  Temp:    SpO2:  98%
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
## 2022-11-27 NOTE — ED Notes (Signed)
Date and time results received: 11/27/22 230911 (use smartphrase ".now" to insert current time)  Test: troponin Critical Value: 117  Name of Provider Notified: M. Donnald Garre, MD

## 2022-11-27 NOTE — ED Triage Notes (Signed)
Dizziness; Blurry Vision w/ spots/squiggly; BP Check; (mild heart failure)   Started last night with visual changes and some loss of vision when I first got up. Dizziness now "that remains". "I just had a heart cath this week". No chest pain. Some sob.   Provider to Room.

## 2022-11-28 ENCOUNTER — Emergency Department (HOSPITAL_COMMUNITY): Payer: PPO

## 2022-11-28 DIAGNOSIS — R55 Syncope and collapse: Secondary | ICD-10-CM | POA: Diagnosis present

## 2022-11-28 DIAGNOSIS — I10 Essential (primary) hypertension: Secondary | ICD-10-CM

## 2022-11-28 DIAGNOSIS — I2510 Atherosclerotic heart disease of native coronary artery without angina pectoris: Secondary | ICD-10-CM

## 2022-11-28 DIAGNOSIS — Z951 Presence of aortocoronary bypass graft: Secondary | ICD-10-CM

## 2022-11-28 DIAGNOSIS — N1831 Chronic kidney disease, stage 3a: Secondary | ICD-10-CM | POA: Diagnosis not present

## 2022-11-28 DIAGNOSIS — Z9861 Coronary angioplasty status: Secondary | ICD-10-CM

## 2022-11-28 DIAGNOSIS — R7989 Other specified abnormal findings of blood chemistry: Secondary | ICD-10-CM

## 2022-11-28 DIAGNOSIS — N179 Acute kidney failure, unspecified: Secondary | ICD-10-CM

## 2022-11-28 LAB — CBG MONITORING, ED: Glucose-Capillary: 113 mg/dL — ABNORMAL HIGH (ref 70–99)

## 2022-11-28 LAB — MRSA NEXT GEN BY PCR, NASAL: MRSA by PCR Next Gen: NOT DETECTED

## 2022-11-28 MED ORDER — VITAMIN C 500 MG PO TABS
500.0000 mg | ORAL_TABLET | Freq: Every day | ORAL | Status: DC
  Administered 2022-11-28 – 2022-12-02 (×5): 500 mg via ORAL
  Filled 2022-11-28 (×5): qty 1

## 2022-11-28 MED ORDER — VITAMIN B-6 100 MG PO TABS
100.0000 mg | ORAL_TABLET | Freq: Every day | ORAL | Status: DC
  Administered 2022-11-28 – 2022-12-01 (×4): 100 mg via ORAL
  Filled 2022-11-28 (×5): qty 1

## 2022-11-28 MED ORDER — PANTOPRAZOLE SODIUM 40 MG PO TBEC
40.0000 mg | DELAYED_RELEASE_TABLET | Freq: Every day | ORAL | Status: DC
  Administered 2022-11-28 – 2022-12-02 (×5): 40 mg via ORAL
  Filled 2022-11-28 (×5): qty 1

## 2022-11-28 MED ORDER — ROSUVASTATIN CALCIUM 20 MG PO TABS
20.0000 mg | ORAL_TABLET | Freq: Every day | ORAL | Status: DC
  Administered 2022-11-28 – 2022-12-02 (×5): 20 mg via ORAL
  Filled 2022-11-28 (×5): qty 1

## 2022-11-28 MED ORDER — ENOXAPARIN SODIUM 40 MG/0.4ML IJ SOSY
40.0000 mg | PREFILLED_SYRINGE | INTRAMUSCULAR | Status: DC
  Administered 2022-11-28 – 2022-12-02 (×5): 40 mg via SUBCUTANEOUS
  Filled 2022-11-28 (×5): qty 0.4

## 2022-11-28 MED ORDER — LOSARTAN POTASSIUM 25 MG PO TABS
12.5000 mg | ORAL_TABLET | Freq: Every day | ORAL | Status: DC
  Administered 2022-11-28 – 2022-11-29 (×2): 12.5 mg via ORAL
  Filled 2022-11-28: qty 0.5
  Filled 2022-11-28: qty 1
  Filled 2022-11-28: qty 0.5

## 2022-11-28 MED ORDER — ASPIRIN 81 MG PO CHEW
81.0000 mg | CHEWABLE_TABLET | Freq: Every day | ORAL | Status: DC
  Administered 2022-11-28 – 2022-12-02 (×5): 81 mg via ORAL
  Filled 2022-11-28 (×5): qty 1

## 2022-11-28 MED ORDER — SODIUM CHLORIDE 0.9 % IV SOLN: INTRAVENOUS | Status: AC

## 2022-11-28 MED ORDER — HYDRALAZINE HCL 10 MG PO TABS
10.0000 mg | ORAL_TABLET | Freq: Four times a day (QID) | ORAL | Status: DC | PRN
  Filled 2022-11-28: qty 1

## 2022-11-28 MED ORDER — POLYETHYL GLYCOL-PROPYL GLYCOL 0.4-0.3 % OP GEL
1.0000 | Freq: Every day | OPHTHALMIC | Status: DC
  Filled 2022-11-28: qty 10

## 2022-11-28 MED ORDER — POLYVINYL ALCOHOL 1.4 % OP SOLN
1.0000 [drp] | Freq: Every day | OPHTHALMIC | Status: DC
  Administered 2022-11-28 – 2022-12-01 (×5): 1 [drp] via OPHTHALMIC
  Filled 2022-11-28 (×2): qty 15

## 2022-11-28 MED ORDER — CHLORHEXIDINE GLUCONATE CLOTH 2 % EX PADS
6.0000 | MEDICATED_PAD | Freq: Every day | CUTANEOUS | Status: DC
  Administered 2022-11-28 – 2022-11-30 (×3): 6 via TOPICAL

## 2022-11-28 MED ORDER — OMEGA-3 FATTY ACIDS 1000 MG PO CAPS: 1.0000 g | ORAL_CAPSULE | Freq: Two times a day (BID) | ORAL | Status: DC

## 2022-11-28 MED ORDER — EZETIMIBE 10 MG PO TABS
5.0000 mg | ORAL_TABLET | Freq: Every day | ORAL | Status: DC
  Administered 2022-11-28 – 2022-12-01 (×4): 5 mg via ORAL
  Filled 2022-11-28 (×4): qty 1

## 2022-11-28 MED ORDER — TRIAMTERENE-HCTZ 37.5-25 MG PO TABS
0.5000 | ORAL_TABLET | Freq: Every day | ORAL | Status: DC
  Administered 2022-11-28 – 2022-11-29 (×2): 0.5 via ORAL
  Filled 2022-11-28 (×2): qty 0.5
  Filled 2022-11-28: qty 1

## 2022-11-28 MED ORDER — METOPROLOL SUCCINATE ER 25 MG PO TB24
12.5000 mg | ORAL_TABLET | Freq: Every morning | ORAL | Status: DC
  Administered 2022-11-28 – 2022-12-02 (×5): 12.5 mg via ORAL
  Filled 2022-11-28 (×5): qty 1

## 2022-11-28 MED ORDER — ACETAMINOPHEN 500 MG PO TABS
1000.0000 mg | ORAL_TABLET | Freq: Four times a day (QID) | ORAL | Status: DC | PRN
  Administered 2022-11-28 – 2022-12-02 (×6): 1000 mg via ORAL
  Filled 2022-11-28 (×6): qty 2

## 2022-11-28 MED ORDER — SODIUM CHLORIDE 0.9% FLUSH
3.0000 mL | Freq: Two times a day (BID) | INTRAVENOUS | Status: DC
  Administered 2022-11-28 – 2022-12-02 (×8): 3 mL via INTRAVENOUS

## 2022-11-28 MED ORDER — CLOPIDOGREL BISULFATE 75 MG PO TABS
75.0000 mg | ORAL_TABLET | Freq: Every day | ORAL | Status: DC
  Administered 2022-11-28 – 2022-12-02 (×5): 75 mg via ORAL
  Filled 2022-11-28 (×5): qty 1

## 2022-11-28 MED ORDER — VITAMIN B-12 1000 MCG PO TABS
5000.0000 ug | ORAL_TABLET | Freq: Every evening | ORAL | Status: DC
  Administered 2022-11-28 – 2022-12-01 (×4): 5000 ug via ORAL
  Filled 2022-11-28 (×4): qty 5

## 2022-11-28 MED ORDER — MIDODRINE HCL 5 MG PO TABS
10.0000 mg | ORAL_TABLET | Freq: Three times a day (TID) | ORAL | Status: DC
  Filled 2022-11-28: qty 2

## 2022-11-28 MED ORDER — PROPYLENE GLYCOL 0.6 % OP SOLN: 1.0000 [drp] | Freq: Three times a day (TID) | OPHTHALMIC | Status: DC | PRN

## 2022-11-28 NOTE — ED Notes (Signed)
ED TO INPATIENT HANDOFF REPORT  ED Nurse Name and Phone #: [phone]  S Name/Age/Gender [name] 77 y.o. male Room/Bed: 040C/040C  Code Status   Code Status: Full Code  Home/SNF/Other Home Patient oriented to: self, place, time, and situation Is this baseline? Yes   Triage Complete: Triage complete  Chief Complaint Syncope [R55]  Triage Note Per EMS  Headache Started yesterday at 7am  Vision Loss Both eyes Started yesterday at 7am AMS Over the last few days Delayed responses EMS VS BP 140/70 HR 80's CBG 149 O2 sat 97%-98%  20G RAC   Allergies Allergies  Allergen Reactions   Codeine Nausea And Vomiting   Guaifenesin & Derivatives Nausea And Vomiting   Terbinafine And Related Other (See Comments)    Muscle pain   Iodinated Contrast Media Itching   Penicillins Rash    Tolerates Cefazolin without problems...[employer]    Level of Care/Admitting Diagnosis ED Disposition     ED Disposition  Admit   Condition  --   Comment  Hospital Area: [facility] [[id number]]  Level of Care: Telemetry Cardiac [103]  May place patient in observation at [facility] or [facility] if equivalent level of care is available:: No  Covid Evaluation: Asymptomatic - no recent exposure (last 10 days) testing not required  Diagnosis: Syncope [206001]  Admitting Physician: Anselm Jungling [[id number]]  Attending Physician: Anselm Jungling [[id number]]          B Medical/Surgery History Past Medical History:  Diagnosis Date   Anginal pain (HCC)    Arthritis    Basal cell carcinoma    back of neck   Blood transfusion without reported diagnosis 2006   during open heart surgery   CAD (coronary artery disease)    Diverticulosis 2013   Noted on Colonoscopy   GERD (gastroesophageal reflux disease)    Headache    History of colon polyps 2008   Noted on Colonoscopy   History of esophageal stricture 2003   Noted on EGD   Hyperlipidemia    Hypertension    IVCD  (intraventricular conduction defect) 05/2018   Noted on EKG   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   Noted on EKG   Left axis deviation 05/2018   Noted on EKG   Lipoma 04/20/2018   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   Loop Biotronik Biomonitor III for syncope and collapse 04/09/2021 04/09/2021   LVH (left ventricular hypertrophy) 05/2018   Noted on EKG   Phimosis    Pneumonia    Syncope and collapse    Tuberculosis 1956   Wears glasses    Wheezing    Past Surgical History:  Procedure Laterality Date   ANGIOPLASTY  2010   3 stents placed   BACK SURGERY     CARDIAC CATHETERIZATION     CATARACT EXTRACTION W/ INTRAOCULAR LENS  IMPLANT, BILATERAL  2012   bilateral   CIRCUMCISION N/A 08/30/2018   Procedure: CIRCUMCISION ADULT;  Surgeon: Ihor Gully, MD;  Location: [facility];  Service: Urology;  Laterality: N/A;   COLONOSCOPY  2013, 2008   CORONARY ANGIOPLASTY     CORONARY ARTERY BYPASS GRAFT  2006   x4   CORONARY ATHERECTOMY N/A 12/24/2021   Procedure: CORONARY ATHERECTOMY;  Surgeon: Yates Decamp, MD;  Location: [facility] INVASIVE CV LAB;  Service: Cardiovascular;  Laterality: N/A;   CORONARY BALLOON ANGIOPLASTY N/A 11/25/2022   Procedure: CORONARY BALLOON ANGIOPLASTY;  Surgeon: Yates Decamp, MD;  Location: [facility] INVASIVE CV LAB;  Service: Cardiovascular;  Laterality: N/A;   CORONARY STENT INTERVENTION N/A 12/24/2021   Procedure: CORONARY STENT INTERVENTION;  Surgeon: Yates Decamp, MD;  Location: [facility] INVASIVE CV LAB;  Service: Cardiovascular;  Laterality: N/A;   ESOPHAGOGASTRODUODENOSCOPY  10/2011   EYE SURGERY     KNEE ARTHROSCOPY  2001   LEFT HEART CATH AND CORS/GRAFTS ANGIOGRAPHY N/A 12/24/2021   Procedure: LEFT HEART CATH AND CORS/GRAFTS ANGIOGRAPHY;  Surgeon: Yates Decamp, MD;  Location: [facility] INVASIVE CV LAB;  Service: Cardiovascular;  Laterality: N/A;   LEFT HEART CATH AND CORS/GRAFTS ANGIOGRAPHY N/A 11/25/2022   Procedure: LEFT HEART CATH AND CORS/GRAFTS  ANGIOGRAPHY;  Surgeon: Yates Decamp, MD;  Location: [facility] INVASIVE CV LAB;  Service: Cardiovascular;  Laterality: N/A;   LUMBAR DISC SURGERY  1998   LUMBAR LAMINECTOMY/DECOMPRESSION MICRODISCECTOMY N/A 07/13/2019   Procedure: Laminectomy and Foraminotomy - Lumbar Two-Lumbar Three - Lumbar Three-Lumbar Four;  Surgeon: Tia Alert, MD;  Location: [facility] OR;  Service: Neurosurgery;  Laterality: N/A;  Laminectomy and Foraminotomy - Lumbar Two-Lumbar Three - Lumbar Three-Lumbar Four   ROTATOR CUFF REPAIR Left 05/24/2020   TENDON RECONSTRUCTION Right 10/27/2013   Procedure: RIGHT OPEN TRICEPS REPAIR;  Surgeon: Sheral Apley, MD;  Location: [facility];  Service: Orthopedics;  Laterality: Right;   TOTAL HIP ARTHROPLASTY Left 08/05/2022   Procedure: TOTAL HIP ARTHROPLASTY;  Surgeon: Teryl Lucy, MD;  Location: [facility] ORS;  Service: Orthopedics;  Laterality: Left;     A IV Location/Drains/Wounds Patient Lines/Drains/Airways Status     Active Line/Drains/Airways     Name Placement date Placement time Site Days   Peripheral IV 11/27/22 20 G 1" Right;Lateral Antecubital 11/27/22  1731  Antecubital  1            Intake/Output Last 24 hours  Intake/Output Summary (Last 24 hours) at 11/28/2022 1515 Last data filed at 11/28/2022 0057 Gross per 24 hour  Intake 1005.94 ml  Output --  Net 1005.94 ml    Labs/Imaging Results for orders placed or performed during the hospital encounter of 11/27/22 (from the past 48 hour(s))  Urinalysis, Routine w reflex microscopic -Urine, Clean Catch     Status: Abnormal   Collection Time: 11/27/22  7:21 PM  Result Value Ref Range   Color, Urine YELLOW YELLOW   APPearance CLEAR CLEAR   Specific Gravity, Urine 1.014 1.005 - 1.030   pH 6.0 5.0 - 8.0   Glucose, UA 50 (A) NEGATIVE mg/dL   Hgb urine dipstick NEGATIVE NEGATIVE   Bilirubin Urine NEGATIVE NEGATIVE   Ketones, ur NEGATIVE NEGATIVE mg/dL   Protein, ur NEGATIVE NEGATIVE mg/dL   Nitrite  NEGATIVE NEGATIVE   Leukocytes,Ua NEGATIVE NEGATIVE    Comment: Performed at [facility], [address], [place], [place] [address]  Resp panel by RT-PCR (RSV, Flu A&B, Covid) Anterior Nasal Swab     Status: None   Collection Time: 11/27/22  7:25 PM   Specimen: Anterior Nasal Swab  Result Value Ref Range   SARS Coronavirus 2 by RT PCR NEGATIVE NEGATIVE   Influenza A by PCR NEGATIVE NEGATIVE   Influenza B by PCR NEGATIVE NEGATIVE    Comment: (NOTE) The Xpert Xpress SARS-CoV-2/FLU/RSV plus assay is intended as an aid in the diagnosis of influenza from Nasopharyngeal swab specimens and should not be used as a sole basis for treatment. Nasal washings and aspirates are unacceptable for Xpert Xpress SARS-CoV-2/FLU/RSV testing.  Fact Sheet for Patients: BloggerCourse.com  Fact Sheet  for Healthcare Providers: SeriousBroker.it  This test is not yet approved or cleared by the [place] and has been authorized for detection and/or diagnosis of SARS-CoV-2 by FDA under an Emergency Use Authorization (EUA). This EUA will remain in effect (meaning this test can be used) for the duration of the COVID-19 declaration under Section 564(b)(1) of the Act, 21 U.S.C. section 360bbb-3(b)(1), unless the authorization is terminated or revoked.     Resp Syncytial Virus by PCR NEGATIVE NEGATIVE    Comment: (NOTE) Fact Sheet for Patients: BloggerCourse.com  Fact Sheet for Healthcare Providers: SeriousBroker.it  This test is not yet approved or cleared by the [place] FDA and has been authorized for detection and/or diagnosis of SARS-CoV-2 by FDA under an Emergency Use Authorization (EUA). This EUA will remain in effect (meaning this test can be used) for the duration of the COVID-19 declaration under Section 564(b)(1) of the Act, 21 U.S.C. section 360bbb-3(b)(1), unless the  authorization is terminated or revoked.  Performed at [facility], [address] [place], [place] [address]   Comprehensive metabolic panel     Status: Abnormal   Collection Time: 11/27/22  7:49 PM  Result Value Ref Range   Sodium 128 (L) 135 - 145 mmol/L   Potassium 3.9 3.5 - 5.1 mmol/L   Chloride 95 (L) 98 - 111 mmol/L   CO2 24 22 - 32 mmol/L   Glucose, Bld 115 (H) 70 - 99 mg/dL    Comment: Glucose reference range applies only to samples taken after fasting for at least 8 hours.   BUN 30 (H) 8 - 23 mg/dL   Creatinine, Ser 9.62 (H) 0.61 - 1.24 mg/dL   Calcium 8.9 8.9 - 95.2 mg/dL   Total Protein 6.3 (L) 6.5 - 8.1 g/dL   Albumin 3.4 (L) 3.5 - 5.0 g/dL   AST 27 15 - 41 U/L   ALT 33 0 - 44 U/L   Alkaline Phosphatase 64 38 - 126 U/L   Total Bilirubin 0.9 0.3 - 1.2 mg/dL   GFR, Estimated 56 (L) >60 mL/min    Comment: (NOTE) Calculated using the CKD-EPI Creatinine Equation (2021)    Anion gap 9 5 - 15    Comment: Performed at [facility], [address] [place], [place] [address]  Troponin I (High Sensitivity)     Status: Abnormal   Collection Time: 11/27/22  7:49 PM  Result Value Ref Range   Troponin I (High Sensitivity) 99 (H) <18 ng/L    Comment: (NOTE) Elevated high sensitivity troponin I (hsTnI) values and significant  changes across serial measurements may suggest ACS but many other  chronic and acute conditions are known to elevate hsTnI results.  Refer to the "Links" section for chest pain algorithms and additional  guidance. Performed at [facility], [address], [place], [place] [address]   CBC with Differential     Status: Abnormal   Collection Time: 11/27/22  7:49 PM  Result Value Ref Range   WBC 7.5 4.0 - 10.5 K/uL   RBC 2.89 (L) 4.22 - 5.81 MIL/uL   Hemoglobin 8.1 (L) 13.0 - 17.0 g/dL   HCT 27.2 (L) 53.6 - 64.4 %   MCV 85.8 80.0 - 100.0 fL   MCH 28.0 26.0 - 34.0 pg   MCHC 32.7 30.0 - 36.0 g/dL   RDW 03.4 (H) 74.2 - 59.5 %    Platelets 240 150 - 400 K/uL   nRBC 0.0 0.0 -  0.2 %   Neutrophils Relative % 44 %   Neutro Abs 3.2 1.7 - 7.7 K/uL   Lymphocytes Relative 45 %   Lymphs Abs 3.4 0.7 - 4.0 K/uL   Monocytes Relative 9 %   Monocytes Absolute 0.7 0.1 - 1.0 K/uL   Eosinophils Relative 2 %   Eosinophils Absolute 0.2 0.0 - 0.5 K/uL   Basophils Relative 0 %   Basophils Absolute 0.0 0.0 - 0.1 K/uL   Immature Granulocytes 0 %   Abs Immature Granulocytes 0.01 0.00 - 0.07 K/uL    Comment: Performed at [facility], [address] [place], [place] [address]  Protime-INR     Status: None   Collection Time: 11/27/22  7:49 PM  Result Value Ref Range   Prothrombin Time 13.9 11.4 - 15.2 seconds   INR 1.1 0.8 - 1.2    Comment: (NOTE) INR goal varies based on device and disease states. Performed at [facility], [address], [place], [place] [address]   Troponin I (High Sensitivity)     Status: Abnormal   Collection Time: 11/27/22  9:21 PM  Result Value Ref Range   Troponin I (High Sensitivity) 114 (HH) <18 ng/L    Comment: CRITICAL RESULT CALLED TO, READ BACK BY AND VERIFIED WITH Kenton Kingfisher RN [phone] M.ALAMANO (NOTE) Elevated high sensitivity troponin I (hsTnI) values and significant  changes across serial measurements may suggest ACS but many other  chronic and acute conditions are known to elevate hsTnI results.  Refer to the "Links" section for chest pain algorithms and additional  guidance. Performed at [facility], [address], [place], [place] [address]   POC occult blood, ED     Status: None   Collection Time: 11/27/22 10:58 PM  Result Value Ref Range   Fecal Occult Bld NEGATIVE NEGATIVE  CBG monitoring, ED     Status: Abnormal   Collection Time: 11/28/22  5:01 AM  Result Value Ref Range   Glucose-Capillary 113 (H) 70 - 99 mg/dL    Comment: Glucose reference range applies only to samples taken after fasting for at least 8 hours.   DG Chest Port 1 View  Result  Date: 11/28/2022 CLINICAL DATA:  Near syncope, recent cardiac catheterization EXAM: PORTABLE CHEST 1 VIEW COMPARISON:  09/11/2021 FINDINGS: Lungs are clear.  No pleural effusion or pneumothorax. The heart is normal in size. Postsurgical changes related to prior CABG. Median sternotomy. IMPRESSION: No acute cardiopulmonary disease. Electronically Signed   By: Charline Bills M.D.   On: 11/28/2022 01:52   CT Head Wo Contrast  Result Date: 11/27/2022 CLINICAL DATA:  Sudden onset headaches, initial encounter EXAM: CT HEAD WITHOUT CONTRAST TECHNIQUE: Contiguous axial images were obtained from the base of the skull through the vertex without intravenous contrast. RADIATION DOSE REDUCTION: This exam was performed according to the departmental dose-optimization program which includes automated exposure control, adjustment of the mA and/or kV according to patient size and/or use of iterative reconstruction technique. COMPARISON:  09/11/2021 FINDINGS: Brain: No evidence of acute infarction, hemorrhage, hydrocephalus, extra-axial collection or mass lesion/mass effect. Mild atrophic changes are noted. Mild chronic white matter ischemic changes are noted. Vascular: No hyperdense vessel or unexpected calcification. Skull: Normal. Negative for fracture or focal lesion. Sinuses/Orbits: No acute finding. Other: None. IMPRESSION: Chronic atrophic and ischemic changes.  No acute abnormality noted. Electronically Signed   By: Alcide Clever M.D.   On: 11/27/2022 21:11    Pending Labs Unresulted  Labs (From admission, onward)    None       Vitals/Pain Today's Vitals   11/28/22 1230 11/28/22 1245 11/28/22 1339 11/28/22 1408  BP: (!) 171/64 (!) 154/62    Pulse: (!) 58     Resp: 17 18    Temp:   98 F (36.7 C)   TempSrc:      SpO2: 100%     Weight:      Height:      PainSc:    0-No pain    Isolation Precautions No active isolations  Medications Medications  sodium chloride flush (NS) 0.9 % injection 3 mL (3  mLs Intravenous Not Given 11/28/22 0438)  enoxaparin (LOVENOX) injection 40 mg (has no administration in time range)  0.9 %  sodium chloride infusion ( Intravenous New Bag/Given 11/28/22 0449)  acetaminophen (TYLENOL) tablet 1,000 mg (has no administration in time range)  aspirin chewable tablet 81 mg (81 mg Oral Given 11/28/22 1413)  ezetimibe (ZETIA) tablet 5 mg (has no administration in time range)  losartan (COZAAR) tablet 12.5 mg (has no administration in time range)  metoprolol succinate (TOPROL-XL) 24 hr tablet 12.5 mg (12.5 mg Oral Given 11/28/22 1413)  rosuvastatin (CRESTOR) tablet 20 mg (20 mg Oral Given 11/28/22 1412)  triamterene-hydrochlorothiazide (MAXZIDE-25) 37.5-25 MG per tablet 0.5 tablet (has no administration in time range)  pantoprazole (PROTONIX) EC tablet 40 mg (40 mg Oral Given 11/28/22 1412)  clopidogrel (PLAVIX) tablet 75 mg (75 mg Oral Given 11/28/22 1413)  cyanocobalamin (VITAMIN B12) tablet 5,000 mcg (has no administration in time range)  pyridOXINE (VITAMIN B6) tablet 100 mg (has no administration in time range)  ascorbic acid (VITAMIN C) tablet 500 mg (has no administration in time range)  polyvinyl alcohol (LIQUIFILM TEARS) 1.4 % ophthalmic solution 1 drop (has no administration in time range)  midodrine (PROAMATINE) tablet 10 mg (has no administration in time range)  sodium chloride 0.9 % bolus 1,000 mL (0 mLs Intravenous Stopped 11/28/22 0057)    Mobility walks with device     Focused Assessments Headache and loss of vision   R Recommendations: See Admitting Provider Note  Report given to:   Additional Notes: patient uses a cane. Possible d/c in 1 or 2 days

## 2022-11-28 NOTE — Assessment & Plan Note (Signed)
-   Creatinine stable at baseline around 1.3

## 2022-11-28 NOTE — Hospital Course (Signed)
[name] is a 77 y.o. male with medical history significant of hypertension, CAD s/p CABG, pulmonary hypertension, GERD, CKD 3a, hyperlipidemia presented to hospital with headache and dizziness.  Of note patient had undergone left heart catheterization on 11/25/2022 for recurrent angina and underwent balloon angioplasty at that time with some 150 to 200 mL of blood loss.  Next TEE with DC understanding up with some confusion and slurred speech with chest tightness.  Patient then presented to the ED but was noted to be afebrile but bradycardic blood pressure was elevated.  Orthostatic vital signs were positive in the ED.  CBC showed no leukocytosis with hemoglobin of 8.1 from previous 9-10.  Rectal exam without blood in the stool and negative FOBT.  CMP showed hyponatremia with sodium of 128.  Creatinine was elevated at 1.3 around baseline.  Troponin at 99 and 114.  EKG showed left axis deviation no significant ST or T wave changes.  Patient was then placed in observation  Assessment and Plan: * Pre-syncope secondary to orthostatic hypotension Orthostatic hypotension likely secondary to acute blood loss.  Hemoglobin also has downward from 9.1 down to 8.1.  Continue hydration.  Cardiology was notified.  Transfuse for hemoglobin less than 8 with history of cardiac issues.   CAD S/P percutaneous coronary angioplasty Elevated troponins. S/p remote CABG. 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  Mild chest tightness with slightly elevated troponins.  Elevated troponins likely secondary to recent cardiac cath.  Continue aspirin and Plavix.  Hydration.  Follow cardiology recommendation.   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  Hold nighttime antihypertensive.  Continue IV fluids.  Check orthostatic blood pressure.

## 2022-11-28 NOTE — Assessment & Plan Note (Addendum)
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 -reports mild chest tightness suspect related to recent left heart cath and current hypovolemia -Continue to follow symptoms after fluid hydration -Cardiology will follow along in the morning -continue aspirin and plavix

## 2022-11-28 NOTE — Progress Notes (Signed)
PROGRESS NOTE    [name]  MWN:[id number] DOB: 02-11-1946 DOA: 11/27/2022 PCP: Gaspar Garbe, MD    Brief Narrative:   [name] is a 77 y.o. male with medical history significant of hypertension, CAD s/p CABG, pulmonary hypertension, GERD, CKD 3a, hyperlipidemia presented to hospital with headache and dizziness.  Of note patient had undergone left heart catheterization on 11/25/2022 for recurrent angina and underwent balloon angioplasty at that time with some 150 to 200 mL of blood loss.  Next TEE with DC understanding up with some confusion and slurred speech with chest tightness.  Patient then presented to the ED but was noted to be afebrile but bradycardic blood pressure was elevated.  Orthostatic vital signs were positive in the ED.  CBC showed no leukocytosis with hemoglobin of 8.1 from previous 9-10.  Rectal exam without blood in the stool and negative FOBT.  CMP showed hyponatremia with sodium of 128.  Creatinine was elevated at 1.3 around baseline.  Troponin at 99 and 114.  EKG showed left axis deviation no significant ST or T wave changes.  Patient was then placed in observation  Assessment and Plan:  * Pre-syncope secondary to orthostatic hypotension Orthostatic hypotension likely secondary to acute blood loss.  Hemoglobin also has downward from 9.1 down to 8.1.  Continue hydration.  Currently on 75 mL of IV fluids.  Was orthostatic this morning at 8 AM.  Will do orthostatic vitals every shift.  Cardiology was notified from the ED..  Transfuse for hemoglobin less than 8 with history of cardiac issues.   CAD S/P percutaneous coronary angioplasty Elevated troponins. S/p remote CABG. 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  Mild chest tightness with slightly elevated troponins.  Elevated troponins likely secondary to recent cardiac cath.  Continue aspirin and Plavix.  Hydration.  Follow cardiology recommendation.  Denies chest discomfort  at the time of my exam.   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  Hold nighttime antihypertensive.  Continue IV fluids.  Check orthostatic blood pressure.   DVT prophylaxis: enoxaparin (LOVENOX) injection 40 mg Start: 11/28/22 1000   Code Status:     Code Status: Full Code  Disposition: Home likely in 1 to 2 days  Status is: Observation    Family Communication: Spoke with the patient's spouse at bedside.  Consultants:  Cardiology  Procedures:  None  Antimicrobials:  None  Anti-infectives (From admission, onward)    None       Subjective: Today, patient was seen and examined at bedside.  Patient was already admitted earlier this morning.  Denies any chest pain, dizziness, lightheadedness while lying down in bed.  Patient's wife at bedside.  Objective: Vitals:   11/28/22 1215 11/28/22 1230 11/28/22 1245 11/28/22 1339  BP: (!) 176/67 (!) 171/64 (!) 154/62   Pulse: (!) 56 (!) 58    Resp: 16 17 18    Temp:    98 F (36.7 C)  TempSrc:      SpO2: 100% 100%    Weight:      Height:        Intake/Output Summary (Last 24 hours) at 11/28/2022 1343 Last data filed at 11/28/2022 0057 Gross per 24 hour  Intake 1005.94 ml  Output --  Net 1005.94 ml   Filed Weights   11/27/22 1850  Weight: 81.2 kg    Physical Examination: Body mass index  is 28.04 kg/m.   General:  Average built, not in obvious distress, elderly male, Communicative, HENT:   Mild pallor noted.  Oral mucosa is moist.  Chest:    Diminished breath sounds bilaterally. No crackles or wheezes.  CVS: S1 &S2 heard. No murmur.  Regular rate and rhythm. Abdomen: Soft, nontender, nondistended.  Bowel sounds are heard.   Extremities: No cyanosis, clubbing or edema.  Peripheral pulses are palpable. Psych: Alert, awake and oriented, normal mood CNS:  No cranial nerve deficits.  Power equal in all extremities.    Skin: Warm and dry.  No rashes noted.  Data Reviewed:   CBC: Recent Labs  Lab 11/27/22 1949  WBC 7.5  NEUTROABS 3.2  HGB 8.1*  HCT 24.8*  MCV 85.8  PLT 240    Basic Metabolic Panel: Recent Labs  Lab 11/27/22 1949  NA 128*  K 3.9  CL 95*  CO2 24  GLUCOSE 115*  BUN 30*  CREATININE 1.31*  CALCIUM 8.9    Liver Function Tests: Recent Labs  Lab 11/27/22 1949  AST 27  ALT 33  ALKPHOS 64  BILITOT 0.9  PROT 6.3*  ALBUMIN 3.4*     Radiology Studies: DG Chest Port 1 View  Result Date: 11/28/2022 CLINICAL DATA:  Near syncope, recent cardiac catheterization EXAM: PORTABLE CHEST 1 VIEW COMPARISON:  09/11/2021 FINDINGS: Lungs are clear.  No pleural effusion or pneumothorax. The heart is normal in size. Postsurgical changes related to prior CABG. Median sternotomy. IMPRESSION: No acute cardiopulmonary disease. Electronically Signed   By: Charline Bills M.D.   On: 11/28/2022 01:52   CT Head Wo Contrast  Result Date: 11/27/2022 CLINICAL DATA:  Sudden onset headaches, initial encounter EXAM: CT HEAD WITHOUT CONTRAST TECHNIQUE: Contiguous axial images were obtained from the base of the skull through the vertex without intravenous contrast. RADIATION DOSE REDUCTION: This exam was performed according to the departmental dose-optimization program which includes automated exposure control, adjustment of the mA and/or kV according to patient size and/or use of iterative reconstruction technique. COMPARISON:  09/11/2021 FINDINGS: Brain: No evidence of acute infarction, hemorrhage, hydrocephalus, extra-axial collection or mass lesion/mass effect. Mild atrophic changes are noted. Mild chronic white matter ischemic changes are noted. Vascular: No hyperdense vessel or unexpected calcification. Skull: Normal. Negative for fracture or focal lesion. Sinuses/Orbits: No acute finding. Other: None. IMPRESSION: Chronic atrophic and ischemic changes.  No acute abnormality noted. Electronically  Signed   By: Alcide Clever M.D.   On: 11/27/2022 21:11      LOS: 0 days    Joycelyn Das, MD [facility] Available via Epic secure chat 7am-7pm After these hours, please refer to coverage provider listed on [email or web address] 11/28/2022, 1:43 PM

## 2022-11-28 NOTE — Assessment & Plan Note (Signed)
-   Troponin elevated at 99 and 114.  Suspect secondary to recent left heart cath and current hypovolemia

## 2022-11-28 NOTE — Assessment & Plan Note (Addendum)
-   Recently underwent complicated left heart cath on 9/17 with cardiologist Dr. Jacinto Halim.  He was noted to have approximately 150 to 200 mL of blood loss.  Has been feeling dizzy since procedure.  He is profoundly orthostatic in the ED with SBP dropping from 120 to 80s upon standing.  Hemoglobin also has downward from 9.1 down to 8.1.  -Symptoms most likely orthostatic hypotension from acute acute on chronic anemia from recent procedural blood loss Will continue to hydrate overnight with fluids and evaluate-symptoms in the morning -Cardiology has been consulted and will also see in consultation in the morning -will follow Hgb in the morning. Transfusion threshold with Hgb <8 given complex cardiac hx

## 2022-11-28 NOTE — H&P (Addendum)
History and Physical    Patient: [name] OZD:[id number] DOB: 11/02/1945 DOA: 11/27/2022 DOS: the patient was seen and examined on 11/28/2022 PCP: Tisovec, Adelfa Koh, MD  Patient coming from: Home  Chief Complaint:  Chief Complaint  Patient presents with   Headache   Loss of Vision    Both    HPI: [name] is a 77 y.o. male with medical history significant of hypertension, CAD s/p CABG, pulmonary hypertension, GERD, CKD 3 A, hyperlipidemia who presents with headache and dizziness.   Patient underwent LHC on 9/17 for recurrent angina symptoms. 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  Procedure was complicated and he had approximately 150 to 200 mL of blood loss.  The day following his procedure he woke up with a headache and was dizzy with sitting or standing.  He felt like his vision was blurry and things he was seeing "looks like broken pieces."  Wife at bedside felt that he was more confused with slurred speech.  Also has chest tightness. Has had decreased p.o. intake.  Denies any nausea, vomiting or diarrhea.  Patient has been compliant with his medication.  In the ED, he was afebrile, bradycardic with heart rate in the 50s, blood pressure mildly elevated up to 160/65 on room air.  Orthostatic vital signs were positive with SBP laying at 120 and dropped down to the 80s upon standing.  CBC with no leukocytosis, hemoglobin mildly trended downwards at 8.1 from a prior of around 9-10.  ED physician performed rectal exam with normal stool and negative FOBT.  She discussed with cardiology Dr. Jacinto Halim and he reports that during his recent catheterization on 9/7 he had about 200 cc of blood loss.  CMP with hyponatremia 128, potassium 3.9, glucose of 115.  Creatinine was elevated 1.31 which seems to around his baseline.  Troponin is elevated at 99 and 114.  EKG on my review with left axis deviation and interventricular delay.  No  significant ST or T wave changes.  COVID/flu/RSV negative.  Review of Systems: As mentioned in the history of present illness. All other systems reviewed and are negative. 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   Noted on EKG   Left axis deviation 05/2018   Noted on EKG   Lipoma 04/20/2018   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   Loop Biotronik Biomonitor III for syncope and collapse 04/09/2021 04/09/2021   LVH (left ventricular hypertrophy) 05/2018   Noted on EKG   Phimosis    Pneumonia    Syncope and collapse    Tuberculosis 1956   Wears glasses    Wheezing    Past Surgical History:  Procedure Laterality Date   ANGIOPLASTY  2010   3 stents placed   BACK SURGERY     CARDIAC CATHETERIZATION  CATARACT EXTRACTION W/ INTRAOCULAR LENS  IMPLANT, BILATERAL  2012   bilateral   CIRCUMCISION N/A 08/30/2018   Procedure: CIRCUMCISION ADULT;  Surgeon: Ihor Gully, MD;  Location: [facility];  Service: Urology;  Laterality: N/A;   COLONOSCOPY  2013, 2008   CORONARY ANGIOPLASTY     CORONARY ARTERY BYPASS GRAFT  2006   x4   CORONARY ATHERECTOMY N/A 12/24/2021   Procedure: CORONARY ATHERECTOMY;  Surgeon: Yates Decamp, MD;  Location: [facility] INVASIVE CV LAB;  Service: Cardiovascular;  Laterality: N/A;   CORONARY BALLOON ANGIOPLASTY N/A 11/25/2022   Procedure: CORONARY BALLOON ANGIOPLASTY;  Surgeon: Yates Decamp, MD;  Location:  [facility] INVASIVE CV LAB;  Service: Cardiovascular;  Laterality: N/A;   CORONARY STENT INTERVENTION N/A 12/24/2021   Procedure: CORONARY STENT INTERVENTION;  Surgeon: Yates Decamp, MD;  Location: [facility] INVASIVE CV LAB;  Service: Cardiovascular;  Laterality: N/A;   ESOPHAGOGASTRODUODENOSCOPY  10/2011   EYE SURGERY     KNEE ARTHROSCOPY  2001   LEFT HEART CATH AND CORS/GRAFTS ANGIOGRAPHY N/A 12/24/2021   Procedure: LEFT HEART CATH AND CORS/GRAFTS ANGIOGRAPHY;  Surgeon: Yates Decamp, MD;  Location: [facility] INVASIVE CV LAB;  Service: Cardiovascular;  Laterality: N/A;   LEFT HEART CATH AND CORS/GRAFTS ANGIOGRAPHY N/A 11/25/2022   Procedure: LEFT HEART CATH AND CORS/GRAFTS ANGIOGRAPHY;  Surgeon: Yates Decamp, MD;  Location: [facility] INVASIVE CV LAB;  Service: Cardiovascular;  Laterality: N/A;   LUMBAR DISC SURGERY  1998   LUMBAR LAMINECTOMY/DECOMPRESSION MICRODISCECTOMY N/A 07/13/2019   Procedure: Laminectomy and Foraminotomy - Lumbar Two-Lumbar Three - Lumbar Three-Lumbar Four;  Surgeon: Tia Alert, MD;  Location: [facility] OR;  Service: Neurosurgery;  Laterality: N/A;  Laminectomy and Foraminotomy - Lumbar Two-Lumbar Three - Lumbar Three-Lumbar Four   ROTATOR CUFF REPAIR Left 05/24/2020   TENDON RECONSTRUCTION Right 10/27/2013   Procedure: RIGHT OPEN TRICEPS REPAIR;  Surgeon: Sheral Apley, MD;  Location:  SURGERY CENTER;  Service: Orthopedics;  Laterality: Right;   TOTAL HIP ARTHROPLASTY Left 08/05/2022   Procedure: TOTAL HIP ARTHROPLASTY;  Surgeon: Teryl Lucy, MD;  Location: [facility] ORS;  Service: Orthopedics;  Laterality: Left;   Social History:  reports that he has never smoked. He has never used smokeless tobacco. He reports that he does not drink alcohol and does not use drugs.  Allergies  Allergen Reactions   Codeine Nausea And Vomiting   Guaifenesin & Derivatives Nausea And Vomiting   Terbinafine And Related Other (See Comments)    Muscle pain   Iodinated Contrast Media Itching   Penicillins Rash     Tolerates Cefazolin without problems...[employer]    Family History  Problem Relation Age of Onset   Colon cancer Mother 71   Hypertension Mother    Colon cancer Father 81   Heart attack Brother    Heart failure Brother    Stomach cancer Neg Hx     Prior to Admission medications   Medication Sig Start Date End Date Taking? Authorizing Provider  acetaminophen (TYLENOL) 500 MG tablet Take 1,000 mg by mouth every 6 (six) hours as needed for moderate pain.   Yes [provider]  aspirin 81 MG chewable tablet Chew 81 mg by mouth every evening.   Yes [provider]  Calcium Carbonate Antacid (TUMS ULTRA PO) Take 2 tablets by mouth daily as needed (acid reflux/indigestion.).   Yes [provider]  clopidogrel (PLAVIX) 75 MG tablet Take 1 tablet (75 mg total) by mouth daily. 11/25/22  Yes Yates Decamp, MD  Coenzyme Q10 (  COQ10 PO) Take 300 mg by mouth 2 (two) times daily.   Yes [provider]  Cyanocobalamin (VITAMIN B-12) 5000 MCG TBDP Take 5,000 mcg by mouth every evening. 12/07/20  Yes [provider]  ezetimibe (ZETIA) 10 MG tablet TAKE 1/2 TABLET BY MOUTH EVERY EVENING 08/21/21  Yes Yates Decamp, MD  fish oil-omega-3 fatty acids 1000 MG capsule Take 1 g by mouth in the morning and at bedtime.   Yes [provider]  fluconazole (DIFLUCAN) 200 MG tablet Take 200 mg by mouth every Saturday. 05/14/22  Yes [provider]  hydrALAZINE (APRESOLINE) 25 MG tablet Take 1 tablet (25 mg total) by mouth 3 (three) times daily as needed (For SBP >140 mm Hg). 05/15/22 05/10/23 Yes Yates Decamp, MD  losartan (COZAAR) 25 MG tablet TAKE 1/2 TABLET BY MOUTH EVERY EVENING 08/21/21  Yes Yates Decamp, MD  metoprolol succinate (TOPROL-XL) 25 MG 24 hr tablet TAKE 1/2 TABLET BY MOUTH EVERY MORNING 11/24/22  Yes Yates Decamp, MD  Multiple Vitamin (MULTIVITAMIN) tablet Take 1 tablet by mouth in the morning. Seniors   Yes [provider]  nitroGLYCERIN (NITROSTAT) 0.4 MG SL  tablet TAKE 1 TABLET EVERY 5 MINUTES AS NEEDED FOR CHEST PAIN. 11/13/22  Yes Yates Decamp, MD  pantoprazole (PROTONIX) 40 MG tablet Take 1 tablet (40 mg total) by mouth daily. 03/17/16  Yes Elgergawy, Leana Roe, MD  Polyethyl Glycol-Propyl Glycol (SYSTANE) 0.4-0.3 % GEL ophthalmic gel Place 1 application into both eyes at bedtime.   Yes [provider]  Propylene Glycol (SYSTANE BALANCE) 0.6 % SOLN Place 1 drop into both eyes 3 (three) times daily as needed (dry/irritated eyes.).   Yes [provider]  pyridoxine (B-6) 100 MG tablet Take 100 mg by mouth every evening.   Yes [provider]  rosuvastatin (CRESTOR) 20 MG tablet TAKE ONE TABLET BY MOUTH EVERYDAY AT BEDTIME 05/06/22  Yes Yates Decamp, MD  traMADol (ULTRAM) 50 MG tablet Take 50 mg by mouth 3 (three) times daily as needed for moderate pain. 10/20/22  Yes [provider]  triamterene-hydrochlorothiazide (MAXZIDE-25) 37.5-25 MG tablet Take 0.5 tablets by mouth every morning. 11/25/22  Yes Yates Decamp, MD  vitamin C (ASCORBIC ACID) 500 MG tablet Take 500 mg by mouth daily.   Yes [provider]    Physical Exam: Vitals:   11/27/22 2255 11/28/22 0100 11/28/22 0130 11/28/22 0200  BP:  (!) 153/66 (!) 150/71 (!) 167/65  Pulse:  (!) 57 (!) 59 (!) 52  Resp:  13 18 16   Temp: 97.8 F (36.6 C)   98 F (36.7 C)  TempSrc: Oral   Oral  SpO2:  99% 100% 100%  Weight:      Height:       Constitutional: NAD, calm, comfortable, fatigue drowsy appearing male appearing younger than stated age laying at approximately 30 degree incline in bed Eyes: lids and conjunctivae normal ENMT: Mucous membranes are moist.  Neck: normal, supple Respiratory: clear to auscultation bilaterally, no wheezing, no crackles. Normal respiratory effort. No accessory muscle use.  Cardiovascular: Regular rate and rhythm, no murmurs / rubs / gallops. No extremity edema. Abdomen: no tenderness, soft Musculoskeletal: no clubbing / cyanosis. No  joint deformity upper and lower extremities. Good ROM, no contractures. Normal muscle tone.  Skin: no rashes, lesions, ulcers. No induration Neurologic: CN 2-12 grossly intact. Fatigued with mumbled speech. Able to follow commands and answer questions appropriately.  Psychiatric: Normal judgment and insight. Alert and oriented x 3. Normal  mood. Data Reviewed:  See HPI  Assessment and Plan: * Pre-syncope - Recently underwent complicated left heart cath on 9/17 with cardiologist Dr. Jacinto Halim.  He was noted to have approximately 150 to 200 mL of blood loss.  Has been feeling dizzy since procedure.  He is profoundly orthostatic in the ED with SBP dropping from 120 to 80s upon standing.  Hemoglobin also has downward from 9.1 down to 8.1.  -Symptoms most likely orthostatic hypotension from acute acute on chronic anemia from recent procedural blood loss Will continue to hydrate overnight with fluids and evaluate-symptoms in the morning -Cardiology has been consulted and will also see in consultation in the morning -will follow Hgb in the morning. 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 -reports mild chest tightness suspect related to recent left heart cath and current hypovolemia -Continue to follow symptoms after fluid hydration -Cardiology will follow along in the morning -continue aspirin and plavix  CKD stage 3a, GFR 45-59 ml/min (HCC) - Creatinine stable at baseline around 1.3  Elevated troponin - Troponin elevated at 99 and 114.  Suspect secondary to recent left heart cath and current hypovolemia  HTN (hypertension) - Resting blood pressure is elevated but has orthostatic hypotension -hold nighttime antihypertensive -resume in the morning after IV fluids overnight      Advance Care Planning:   Code Status: Full Code   Consults:  cardiology  Family Communication: wife at bedside  Severity of Illness: The appropriate patient status for this patient is OBSERVATION. Observation status is judged to be reasonable and necessary in order to provide the required intensity of service to ensure the patient's safety. The patient's presenting symptoms, physical exam findings, and initial radiographic and laboratory data in the context of their medical condition is felt to place them at decreased risk for further clinical deterioration. Furthermore, it is anticipated that the patient will be medically stable for discharge from the hospital within 2 midnights of admission.   Author: Anselm Jungling, DO 11/28/2022 3:07 AM  For on call review [email or web address].

## 2022-11-28 NOTE — Assessment & Plan Note (Addendum)
-   Resting blood pressure is elevated but has orthostatic hypotension -hold nighttime antihypertensive -resume in the morning after IV fluids overnight

## 2022-11-28 NOTE — Assessment & Plan Note (Deleted)
-  reports mild chest tightness suspect related to recent left heart cath and current hypovolemia -Continue to follow symptoms after fluid hydration -Cardiology will follow along in the morning

## 2022-11-29 DIAGNOSIS — I25718 Atherosclerosis of autologous vein coronary artery bypass graft(s) with other forms of angina pectoris: Secondary | ICD-10-CM | POA: Diagnosis not present

## 2022-11-29 DIAGNOSIS — J323 Chronic sphenoidal sinusitis: Secondary | ICD-10-CM | POA: Diagnosis not present

## 2022-11-29 DIAGNOSIS — E2740 Unspecified adrenocortical insufficiency: Secondary | ICD-10-CM | POA: Diagnosis not present

## 2022-11-29 DIAGNOSIS — I130 Hypertensive heart and chronic kidney disease with heart failure and stage 1 through stage 4 chronic kidney disease, or unspecified chronic kidney disease: Secondary | ICD-10-CM | POA: Diagnosis not present

## 2022-11-29 DIAGNOSIS — E871 Hypo-osmolality and hyponatremia: Secondary | ICD-10-CM | POA: Diagnosis not present

## 2022-11-29 DIAGNOSIS — I5032 Chronic diastolic (congestive) heart failure: Secondary | ICD-10-CM | POA: Diagnosis not present

## 2022-11-29 DIAGNOSIS — T82855D Stenosis of coronary artery stent, subsequent encounter: Secondary | ICD-10-CM | POA: Diagnosis not present

## 2022-11-29 DIAGNOSIS — Z9861 Coronary angioplasty status: Secondary | ICD-10-CM | POA: Diagnosis not present

## 2022-11-29 DIAGNOSIS — I2510 Atherosclerotic heart disease of native coronary artery without angina pectoris: Secondary | ICD-10-CM | POA: Diagnosis not present

## 2022-11-29 DIAGNOSIS — I10 Essential (primary) hypertension: Secondary | ICD-10-CM | POA: Diagnosis not present

## 2022-11-29 DIAGNOSIS — R7989 Other specified abnormal findings of blood chemistry: Secondary | ICD-10-CM | POA: Diagnosis not present

## 2022-11-29 DIAGNOSIS — Z7902 Long term (current) use of antithrombotics/antiplatelets: Secondary | ICD-10-CM | POA: Diagnosis not present

## 2022-11-29 DIAGNOSIS — K219 Gastro-esophageal reflux disease without esophagitis: Secondary | ICD-10-CM | POA: Diagnosis not present

## 2022-11-29 DIAGNOSIS — Z1152 Encounter for screening for COVID-19: Secondary | ICD-10-CM | POA: Diagnosis not present

## 2022-11-29 DIAGNOSIS — I25118 Atherosclerotic heart disease of native coronary artery with other forms of angina pectoris: Secondary | ICD-10-CM | POA: Diagnosis not present

## 2022-11-29 DIAGNOSIS — I272 Pulmonary hypertension, unspecified: Secondary | ICD-10-CM | POA: Diagnosis not present

## 2022-11-29 DIAGNOSIS — I951 Orthostatic hypotension: Secondary | ICD-10-CM | POA: Diagnosis not present

## 2022-11-29 DIAGNOSIS — I350 Nonrheumatic aortic (valve) stenosis: Secondary | ICD-10-CM | POA: Diagnosis not present

## 2022-11-29 DIAGNOSIS — E782 Mixed hyperlipidemia: Secondary | ICD-10-CM | POA: Diagnosis not present

## 2022-11-29 DIAGNOSIS — Z9842 Cataract extraction status, left eye: Secondary | ICD-10-CM | POA: Diagnosis not present

## 2022-11-29 DIAGNOSIS — Z96642 Presence of left artificial hip joint: Secondary | ICD-10-CM | POA: Diagnosis not present

## 2022-11-29 DIAGNOSIS — Z7982 Long term (current) use of aspirin: Secondary | ICD-10-CM | POA: Diagnosis not present

## 2022-11-29 DIAGNOSIS — N1831 Chronic kidney disease, stage 3a: Secondary | ICD-10-CM | POA: Diagnosis not present

## 2022-11-29 DIAGNOSIS — R55 Syncope and collapse: Secondary | ICD-10-CM | POA: Diagnosis not present

## 2022-11-29 DIAGNOSIS — R519 Headache, unspecified: Secondary | ICD-10-CM | POA: Diagnosis not present

## 2022-11-29 DIAGNOSIS — Z951 Presence of aortocoronary bypass graft: Secondary | ICD-10-CM | POA: Diagnosis not present

## 2022-11-29 DIAGNOSIS — H547 Unspecified visual loss: Secondary | ICD-10-CM | POA: Diagnosis not present

## 2022-11-29 DIAGNOSIS — Z9841 Cataract extraction status, right eye: Secondary | ICD-10-CM | POA: Diagnosis not present

## 2022-11-29 DIAGNOSIS — D509 Iron deficiency anemia, unspecified: Secondary | ICD-10-CM | POA: Diagnosis not present

## 2022-11-29 DIAGNOSIS — Z79899 Other long term (current) drug therapy: Secondary | ICD-10-CM | POA: Diagnosis not present

## 2022-11-29 DIAGNOSIS — Y831 Surgical operation with implant of artificial internal device as the cause of abnormal reaction of the patient, or of later complication, without mention of misadventure at the time of the procedure: Secondary | ICD-10-CM | POA: Diagnosis present

## 2022-11-29 LAB — CBC
HCT: 25.7 % — ABNORMAL LOW (ref 39.0–52.0)
Hemoglobin: 8.2 g/dL — ABNORMAL LOW (ref 13.0–17.0)
MCH: 26.5 pg (ref 26.0–34.0)
MCHC: 31.9 g/dL (ref 30.0–36.0)
MCV: 82.9 fL (ref 80.0–100.0)
Platelets: 223 10*3/uL (ref 150–400)
RBC: 3.1 MIL/uL — ABNORMAL LOW (ref 4.22–5.81)
RDW: 16.9 % — ABNORMAL HIGH (ref 11.5–15.5)
WBC: 5.9 10*3/uL (ref 4.0–10.5)
nRBC: 0.3 % — ABNORMAL HIGH (ref 0.0–0.2)

## 2022-11-29 LAB — BASIC METABOLIC PANEL
Anion gap: 11 (ref 5–15)
BUN: 18 mg/dL (ref 8–23)
CO2: 22 mmol/L (ref 22–32)
Calcium: 9 mg/dL (ref 8.9–10.3)
Chloride: 95 mmol/L — ABNORMAL LOW (ref 98–111)
Creatinine, Ser: 1.11 mg/dL (ref 0.61–1.24)
GFR, Estimated: >60 mL/min (ref >60)
Glucose, Bld: 127 mg/dL — ABNORMAL HIGH (ref 70–99)
Potassium: 4 mmol/L (ref 3.5–5.1)
Sodium: 128 mmol/L — ABNORMAL LOW (ref 135–145)

## 2022-11-29 LAB — MAGNESIUM: Magnesium: 1.9 mg/dL (ref 1.7–2.4)

## 2022-11-29 LAB — GLUCOSE, CAPILLARY: Glucose-Capillary: 125 mg/dL — ABNORMAL HIGH (ref 70–99)

## 2022-11-29 MED ORDER — SODIUM CHLORIDE 0.9 % IV SOLN: INTRAVENOUS | Status: DC

## 2022-11-29 MED ORDER — MIDODRINE HCL 5 MG PO TABS
2.5000 mg | ORAL_TABLET | Freq: Three times a day (TID) | ORAL | Status: DC
  Administered 2022-11-29 – 2022-12-01 (×3): 2.5 mg via ORAL
  Filled 2022-11-29 (×6): qty 1

## 2022-11-29 NOTE — Progress Notes (Signed)
The NT and the RN tried to take patient's orthostatic vitals this morning. Patient could not tolerate to stand for 2 minutes. He started getting dizzy and diaphoretic.Patient put back in bed.

## 2022-11-29 NOTE — Progress Notes (Addendum)
PROGRESS NOTE    NYMIR KRUCHTEN  ZOX:[id number] DOB: 1945/11/15 DOA: 11/27/2022 PCP: Gaspar Garbe, MD    Brief Narrative:   [name] is a 77 y.o. male with medical history significant of hypertension, CAD s/p CABG, pulmonary hypertension, GERD, CKD 3a, hyperlipidemia presented to hospital with headache and dizziness.  Of note patient had undergone left heart catheterization on 11/25/2022 for recurrent angina and underwent balloon angioplasty at that time with some 150 to 200 mL of blood loss.  He presented to the hospital this time with  some confusion slurred speech with chest tightness and tunnel vision with headache.  He was noted to be afebrile but bradycardic blood pressure was elevated.  Orthostatic vital signs were positive in the ED.  CBC showed no leukocytosis with hemoglobin of 8.1 from previous 9-10.  Rectal exam without blood in the stool and negative FOBT.  CMP showed hyponatremia with sodium of 128.  Creatinine was elevated at 1.3 around baseline.  Troponin at 99 and 114.  EKG showed left axis deviation, no significant ST or T wave changes.  Patient was then placed in observation.   Assessment and Plan:  * Pre-syncope secondary to orthostatic hypotension Orthostatic hypotension likely secondary to acute blood loss.  Patient is still orthostatic.  Will continue with IV fluid hydration at 75 mL/h.  He does have episodes of increased hypertension on lying down.  Will add abdominal binder, compression stockings today.  Orthostatic precautions were explained to the patient.  I did speak with cardiology Dr. Jacinto Halim yesterday who did not recommend any blood transfusion.  Will try low-dose midodrine today at 2.5 3 times daily.  Continue orthostatic vitals every shift.    Transfuse for hemoglobin less than 8 with history of cardiac issues.  Latest hemoglobin of 8.2.   CAD S/P percutaneous coronary angioplasty Elevated troponins. S/p remote CABG. LHC 9/17 with  in-stent restenosis  of Lcx s/p Cutting Balloon angioplasty.  Mild chest tightness with slightly elevated troponins on presentation..  Elevated troponins likely secondary to recent cardiac cath.  Continue aspirin and Plavix.  Continue hydration.  Denies chest discomfort at the time of my exam.  Spoke with Dr. Jacinto Halim yesterday who recommended midodrine resuscitation.   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  Latest creatinine of 1.1.   HTN (hypertension) Currently having orthostatic hypotension but supine hypertension.   Hyponatremia.  Sodium level of 128 today.  On normal saline.  Will need to reassess fluids needs in a.m. due to history of congestive heart failure.  Currently compensated.  Check BMP in AM.   DVT prophylaxis: enoxaparin (LOVENOX) injection 40 mg Start: 11/28/22 1000   Code Status:     Code Status: Full Code  Disposition: Home likely in 1 to 2 days, still with orthostatic hypotension, pending clinical improvement.  Status is: Observation   Family Communication: Spoke with the patient's spouse at bedside.  Consultants:  Cardiology-spoke with Dr. Jacinto Halim.  Procedures:  None  Antimicrobials:  None  Anti-infectives (From admission, onward)    None       Subjective: Today, patient was seen and examined at bedside.  Still complains of dizziness on standing.  Has some headache of which is not new but some visual issues.  Discussed about orthostatic precautions at bedside.    Objective: Vitals:   11/29/22 0824 11/29/22 1003 11/29/22 1004 11/29/22 1008  BP: 118/64 (!) 154/72 (!) 156/62 (!) 141/73  Pulse: 62  Resp: 17     Temp: 98.3 F (36.8 C)     TempSrc: Oral     SpO2: 90%     Weight:      Height:        Intake/Output Summary (Last 24 hours) at 11/29/2022 1046 Last data filed at 11/29/2022 1610 Gross per 24 hour  Intake 1118.55 ml  Output 300 ml  Net 818.55 ml   Filed Weights   11/27/22 1850 11/28/22 1645 11/29/22 0625   Weight: 81.2 kg 83.7 kg 80.9 kg    Physical Examination: Body mass index is 27.93 kg/m.   General:  Average built, not in obvious distress, elderly male, Communicative, HENT:   Mild pallor noted.  Oral mucosa is moist.  Chest:    Diminished breath sounds bilaterally. No crackles or wheezes.  CVS: S1 &S2 heard. No murmur.  Regular rate and rhythm. Abdomen: Soft, nontender, nondistended.  Bowel sounds are heard.   Extremities: No cyanosis, clubbing or edema.  Peripheral pulses are palpable. Psych: Alert, awake and oriented, normal mood CNS:  No cranial nerve deficits.  Power equal in all extremities.   Skin: Warm and dry.  No rashes noted.  Data Reviewed:   CBC: Recent Labs  Lab 11/27/22 1949 11/29/22 0233  WBC 7.5 5.9  NEUTROABS 3.2  --   HGB 8.1* 8.2*  HCT 24.8* 25.7*  MCV 85.8 82.9  PLT 240 223    Basic Metabolic Panel: Recent Labs  Lab 11/27/22 1949 11/29/22 0233  NA 128* 128*  K 3.9 4.0  CL 95* 95*  CO2 24 22  GLUCOSE 115* 127*  BUN 30* 18  CREATININE 1.31* 1.11  CALCIUM 8.9 9.0  MG  --  1.9    Liver Function Tests: Recent Labs  Lab 11/27/22 1949  AST 27  ALT 33  ALKPHOS 64  BILITOT 0.9  PROT 6.3*  ALBUMIN 3.4*     Radiology Studies: DG Chest Port 1 View  Result Date: 11/28/2022 CLINICAL DATA:  Near syncope, recent cardiac catheterization EXAM: PORTABLE CHEST 1 VIEW COMPARISON:  09/11/2021 FINDINGS: Lungs are clear.  No pleural effusion or pneumothorax. The heart is normal in size. Postsurgical changes related to prior CABG. Median sternotomy. IMPRESSION: No acute cardiopulmonary disease. Electronically Signed   By: Charline Bills M.D.   On: 11/28/2022 01:52   CT Head Wo Contrast  Result Date: 11/27/2022 CLINICAL DATA:  Sudden onset headaches, initial encounter EXAM: CT HEAD WITHOUT CONTRAST TECHNIQUE: Contiguous axial images were obtained from the base of the skull through the vertex without intravenous contrast. RADIATION DOSE REDUCTION:  This exam was performed according to the departmental dose-optimization program which includes automated exposure control, adjustment of the mA and/or kV according to patient size and/or use of iterative reconstruction technique. COMPARISON:  09/11/2021 FINDINGS: Brain: No evidence of acute infarction, hemorrhage, hydrocephalus, extra-axial collection or mass lesion/mass effect. Mild atrophic changes are noted. Mild chronic white matter ischemic changes are noted. Vascular: No hyperdense vessel or unexpected calcification. Skull: Normal. Negative for fracture or focal lesion. Sinuses/Orbits: No acute finding. Other: None. IMPRESSION: Chronic atrophic and ischemic changes.  No acute abnormality noted. Electronically Signed   By: Alcide Clever M.D.   On: 11/27/2022 21:11      LOS: 0 days    Joycelyn Das, MD [facility] Available via Epic secure chat 7am-7pm After these hours, please refer to coverage provider listed on [email or web address] 11/29/2022, 10:46 AM

## 2022-11-30 ENCOUNTER — Inpatient Hospital Stay (HOSPITAL_COMMUNITY): Payer: PPO

## 2022-11-30 DIAGNOSIS — I2510 Atherosclerotic heart disease of native coronary artery without angina pectoris: Secondary | ICD-10-CM

## 2022-11-30 DIAGNOSIS — R55 Syncope and collapse: Secondary | ICD-10-CM | POA: Diagnosis not present

## 2022-11-30 LAB — BASIC METABOLIC PANEL
Anion gap: 9 (ref 5–15)
Anion gap: 5 (ref 5–15)
BUN: 15 mg/dL (ref 8–23)
BUN: 15 mg/dL (ref 8–23)
CO2: 22 mmol/L (ref 22–32)
CO2: 24 mmol/L (ref 22–32)
Calcium: 8.6 mg/dL — ABNORMAL LOW (ref 8.9–10.3)
Calcium: 8 mg/dL — ABNORMAL LOW (ref 8.9–10.3)
Chloride: 95 mmol/L — ABNORMAL LOW (ref 98–111)
Chloride: 96 mmol/L — ABNORMAL LOW (ref 98–111)
Creatinine, Ser: 1.11 mg/dL (ref 0.61–1.24)
Creatinine, Ser: 1.31 mg/dL — ABNORMAL HIGH (ref 0.61–1.24)
GFR, Estimated: >60 mL/min (ref >60)
GFR, Estimated: 56 mL/min — ABNORMAL LOW (ref >60)
Glucose, Bld: 119 mg/dL — ABNORMAL HIGH (ref 70–99)
Glucose, Bld: 107 mg/dL — ABNORMAL HIGH (ref 70–99)
Potassium: 3.8 mmol/L (ref 3.5–5.1)
Potassium: 3.9 mmol/L (ref 3.5–5.1)
Sodium: 126 mmol/L — ABNORMAL LOW (ref 135–145)
Sodium: 125 mmol/L — ABNORMAL LOW (ref 135–145)

## 2022-11-30 LAB — ECHOCARDIOGRAM COMPLETE
AR max vel: 1.43 cm2
AV Area VTI: 1.49 cm2
AV Area mean vel: 1.36 cm2
AV Mean grad: 11 mmHg
AV Peak grad: 20.6 mmHg
Ao pk vel: 2.27 m/s
Area-P 1/2: 2.34 cm2
Height: 67 in
S' Lateral: 3.2 cm
Weight: 2846.58 oz

## 2022-11-30 LAB — SODIUM
Sodium: 126 mmol/L — ABNORMAL LOW (ref 135–145)
Sodium: 127 mmol/L — ABNORMAL LOW (ref 135–145)

## 2022-11-30 LAB — CBC
HCT: 25.1 % — ABNORMAL LOW (ref 39.0–52.0)
Hemoglobin: 8.4 g/dL — ABNORMAL LOW (ref 13.0–17.0)
MCH: 27.9 pg (ref 26.0–34.0)
MCHC: 33.5 g/dL (ref 30.0–36.0)
MCV: 83.4 fL (ref 80.0–100.0)
Platelets: 211 10*3/uL (ref 150–400)
RBC: 3.01 MIL/uL — ABNORMAL LOW (ref 4.22–5.81)
RDW: 16.7 % — ABNORMAL HIGH (ref 11.5–15.5)
WBC: 6.5 10*3/uL (ref 4.0–10.5)
nRBC: 0 % (ref 0.0–0.2)

## 2022-11-30 LAB — MAGNESIUM: Magnesium: 1.9 mg/dL (ref 1.7–2.4)

## 2022-11-30 LAB — OSMOLALITY, URINE: Osmolality, Ur: 267 mOsm/kg — ABNORMAL LOW (ref 300–900)

## 2022-11-30 LAB — SODIUM, URINE, RANDOM: Sodium, Ur: 58 mmol/L

## 2022-11-30 LAB — TSH: TSH: 1.905 u[IU]/mL (ref 0.350–4.500)

## 2022-11-30 LAB — GLUCOSE, CAPILLARY: Glucose-Capillary: 115 mg/dL — ABNORMAL HIGH (ref 70–99)

## 2022-11-30 LAB — CORTISOL: Cortisol, Plasma: 5.5 ug/dL

## 2022-11-30 LAB — OSMOLALITY: Osmolality: 269 mOsm/kg — ABNORMAL LOW (ref 275–295)

## 2022-11-30 LAB — BRAIN NATRIURETIC PEPTIDE: B Natriuretic Peptide: 75.2 pg/mL (ref 0.0–100.0)

## 2022-11-30 MED ORDER — SODIUM CHLORIDE 1 G PO TABS
1.0000 g | ORAL_TABLET | Freq: Two times a day (BID) | ORAL | Status: DC
  Administered 2022-11-30 – 2022-12-01 (×2): 1 g via ORAL
  Filled 2022-11-30 (×2): qty 1

## 2022-11-30 MED ORDER — COSYNTROPIN 0.25 MG IJ SOLR
0.2500 mg | Freq: Once | INTRAMUSCULAR | Status: AC
  Administered 2022-12-01: 0.25 mg via INTRAVENOUS
  Filled 2022-11-30: qty 0.25

## 2022-11-30 MED ORDER — SODIUM CHLORIDE 0.9 % IV SOLN: INTRAVENOUS | Status: DC

## 2022-11-30 NOTE — Progress Notes (Signed)
PROGRESS NOTE    [name]  LOV:[id number] DOB: 07/21/1945 DOA: 11/27/2022 PCP: Gaspar Garbe, MD   Brief Narrative: 77 year old with past medical history significant for hypertension, CAD status post CABG, pulmonary hypertension, GERD, CKD 3A, hyperlipidemia presents complaining of headache and dizziness.  Of note patient underwent went left heart catheterization 11/25/2022 for recurrent angina and underwent balloon angioplasty at that time with some 150 to 200 mL of blood loss.  He presented to the hospital with some confusion, slurred speech, chest tightness and tunnel vision with headache.  He was also noted to be bradycardic.  Orthostatic vitals were positive in the ED.  Hemoglobin 8.1 from previous 9-10.  Rectal exam deferred FOBT.  Hyponatremia with a sodium level of 128.     Assessment & Plan:   Principal Problem:   Pre-syncope Active Problems:   HTN (hypertension)   Hx of CABG   Elevated troponin   CKD stage 3a, GFR 45-59 ml/min (HCC)   CAD S/P percutaneous coronary angioplasty   Syncope   1-Pre-Syncope, Orthostatic hypotension.  -Plan to Hold BP medications: losartan, Maxzide.  -discussed with Dr Jacinto Halim, hold BP medications, and do PRN midodrine.  -he received IV fluids.  -Will check TSH, cortisol.   CAD S/P percutaneous coronary angioplasty Elevated troponin -Status post remote CABG -Left heart cath 9/17 for stent stenosis of Lcx  status post balloon angioplasty -Continue with aspirin and Plavix -continue with metoprolol.   CKD stage IIIa: -stable,. Cr baseline 1.3   Hypertension, currently with orthostatic hypotension -Cardiology recommended Midodrine.  -hold meds.   Acute on chronic Hyponatremia History of Hyponatremia sodium 130---134.  2 weeks ago sodium was normal.  Check urine osmolality, urine sodium.  Hold IV fluids.  Discontinue: Triamterene hydrochlorothiazide.  Check TSH, cortisol.   Headaches, Dizziness;  Still having headaches,  dizziness. Dizziness could be explain by orthistatic hypotension.  Will proceed with MRI to work up headaches   Estimated body mass index is 27.86 kg/m as calculated from the following:   Height as of this encounter: 5\' 7"  (1.702 m).   Weight as of this encounter: 80.7 kg.   DVT prophylaxis: Lovenox Code Status: Full Code Family Communication: Wife at bedsdie.  Disposition Plan:  Status is: Inpatient Remains inpatient appropriate because: management of Hyponatremia. Orthostatic hypotension.     Consultants:  Dr Jacinto Halim  Procedures:  none  Antimicrobials:    Subjective: He s  Objective: Vitals:   11/29/22 2000 11/29/22 2003 11/29/22 2346 11/30/22 0245  BP:   131/61 134/62  Pulse:   61 65  Resp:   20 20  Temp:   98.3 F (36.8 C) 97.7 F (36.5 C)  TempSrc:   Oral Oral  SpO2: 98% 100% 99% 98%  Weight:    80.7 kg  Height:        Intake/Output Summary (Last 24 hours) at 11/30/2022 0710 Last data filed at 11/30/2022 0245 Gross per 24 hour  Intake 1926.46 ml  Output --  Net 1926.46 ml   Filed Weights   11/28/22 1645 11/29/22 0625 11/30/22 0245  Weight: 83.7 kg 80.9 kg 80.7 kg    Examination:  General exam: Appears calm and comfortable  Respiratory system: Clear to auscultation. Respiratory effort normal. Cardiovascular system: S1 & S2 heard, RRR. No JVD, murmurs, rubs, gallops or clicks. No pedal edema. Gastrointestinal system: Abdomen is nondistended, soft and nontender.  Central nervous system: Alert and oriented. No focal neurological deficits. Extremities: Symmetric 5 x 5 power.   Data  Reviewed: I have personally reviewed following labs and imaging studies  CBC: Recent Labs  Lab 11/27/22 1949 11/29/22 0233 11/30/22 0229  WBC 7.5 5.9 6.5  NEUTROABS 3.2  --   --   HGB 8.1* 8.2* 8.4*  HCT 24.8* 25.7* 25.1*  MCV 85.8 82.9 83.4  PLT 240 223 211   Basic Metabolic Panel: Recent Labs  Lab 11/27/22 1949 11/29/22 0233 11/30/22 0229  NA 128* 128*  126*  K 3.9 4.0 3.8  CL 95* 95* 95*  CO2 24 22 22   GLUCOSE 115* 127* 119*  BUN 30* 18 15  CREATININE 1.31* 1.11 1.11  CALCIUM 8.9 9.0 8.6*  MG  --  1.9 1.9   GFR: Estimated Creatinine Clearance: 55.8 mL/min (by C-G formula based on SCr of 1.11 mg/dL). Liver Function Tests: Recent Labs  Lab 11/27/22 1949  AST 27  ALT 33  ALKPHOS 64  BILITOT 0.9  PROT 6.3*  ALBUMIN 3.4*   No results for input(s): "LIPASE", "AMYLASE" in the last 168 hours. No results for input(s): "AMMONIA" in the last 168 hours. Coagulation Profile: Recent Labs  Lab 11/27/22 1949  INR 1.1   Cardiac Enzymes: No results for input(s): "CKTOTAL", "CKMB", "CKMBINDEX", "TROPONINI" in the last 168 hours. BNP (last 3 results) Recent Labs    11/13/22 1210  PROBNP 667*   HbA1C: No results for input(s): "HGBA1C" in the last 72 hours. CBG: Recent Labs  Lab 11/28/22 0501 11/29/22 0622 11/30/22 0555  GLUCAP 113* 125* 115*   Lipid Profile: No results for input(s): "CHOL", "HDL", "LDLCALC", "TRIG", "CHOLHDL", "LDLDIRECT" in the last 72 hours. Thyroid Function Tests: No results for input(s): "TSH", "T4TOTAL", "FREET4", "T3FREE", "THYROIDAB" in the last 72 hours. Anemia Panel: No results for input(s): "VITAMINB12", "FOLATE", "FERRITIN", "TIBC", "IRON", "RETICCTPCT" in the last 72 hours. Sepsis Labs: No results for input(s): "PROCALCITON", "LATICACIDVEN" in the last 168 hours.  Recent Results (from the past 240 hour(s))  Resp panel by RT-PCR (RSV, Flu A&B, Covid) Anterior Nasal Swab     Status: None   Collection Time: 11/27/22  7:25 PM   Specimen: Anterior Nasal Swab  Result Value Ref Range Status   SARS Coronavirus 2 by RT PCR NEGATIVE NEGATIVE Final   Influenza A by PCR NEGATIVE NEGATIVE Final   Influenza B by PCR NEGATIVE NEGATIVE Final    Comment: (NOTE) The Xpert Xpress SARS-CoV-2/FLU/RSV plus assay is intended as an aid in the diagnosis of influenza from Nasopharyngeal swab specimens and should  not be used as a sole basis for treatment. Nasal washings and aspirates are unacceptable for Xpert Xpress SARS-CoV-2/FLU/RSV testing.  Fact Sheet for Patients: BloggerCourse.com  Fact Sheet for Healthcare Providers: SeriousBroker.it  This test is not yet approved or cleared by the [place] FDA and has been authorized for detection and/or diagnosis of SARS-CoV-2 by FDA under an Emergency Use Authorization (EUA). This EUA will remain in effect (meaning this test can be used) for the duration of the COVID-19 declaration under Section 564(b)(1) of the Act, 21 U.S.C. section 360bbb-3(b)(1), unless the authorization is terminated or revoked.     Resp Syncytial Virus by PCR NEGATIVE NEGATIVE Final    Comment: (NOTE) Fact Sheet for Patients: BloggerCourse.com  Fact Sheet for Healthcare Providers: SeriousBroker.it  This test is not yet approved or cleared by the [place] FDA and has been authorized for detection and/or diagnosis of SARS-CoV-2 by FDA under an Emergency Use Authorization (EUA). This EUA will remain in effect (meaning this test can be used)  for the duration of the COVID-19 declaration under Section 564(b)(1) of the Act, 21 U.S.C. section 360bbb-3(b)(1), unless the authorization is terminated or revoked.  Performed at [facility], [address], [place], [place] [address]   MRSA Next Gen by PCR, Nasal     Status: None   Collection Time: 11/28/22  5:15 AM   Specimen: Nasal Mucosa; Nasal Swab  Result Value Ref Range Status   MRSA by PCR Next Gen NOT DETECTED NOT DETECTED Final    Comment: (NOTE) The GeneXpert MRSA Assay (FDA approved for NASAL specimens only), is one component of a comprehensive MRSA colonization surveillance program. It is not intended to diagnose MRSA infection nor to guide or monitor treatment for MRSA infections. Test performance is  not FDA approved in patients less than 48 years old. Performed at [facility], [address] [place], [place] [address]          Radiology Studies: No results found.      Scheduled Meds:  ascorbic acid  500 mg Oral Daily   aspirin  81 mg Oral Daily   Chlorhexidine Gluconate Cloth  6 each Topical Daily   clopidogrel  75 mg Oral Daily   cyanocobalamin  5,000 mcg Oral QPM   enoxaparin (LOVENOX) injection  40 mg Subcutaneous Q24H   ezetimibe  5 mg Oral QHS   losartan  12.5 mg Oral Daily   metoprolol succinate  12.5 mg Oral q morning   midodrine  2.5 mg Oral TID WC   pantoprazole  40 mg Oral Daily   polyvinyl alcohol  1 drop Both Eyes QHS   pyridoxine  100 mg Oral QHS   rosuvastatin  20 mg Oral Daily   sodium chloride flush  3 mL Intravenous Q12H   triamterene-hydrochlorothiazide  0.5 tablet Oral Daily   Continuous Infusions:  sodium chloride 75 mL/hr at 11/30/22 0245     LOS: 1 day    Time spent: 35 minutes    Jenina Moening A Reigan Tolliver, MD [facility]   If 7PM-7AM, please contact night-coverage [email or web address]  11/30/2022, 7:10 AM

## 2022-11-30 NOTE — Plan of Care (Signed)
  Problem: Education: Goal: Understanding of CV disease, CV risk reduction, and recovery process will improve Outcome: Progressing Goal: Individualized Educational Video(s) Outcome: Progressing   Problem: Activity: Goal: Ability to return to baseline activity level will improve Outcome: Progressing   Problem: Cardiovascular: Goal: Ability to achieve and maintain adequate cardiovascular perfusion will improve Outcome: Progressing Goal: Vascular access site(s) Level 0-1 will be maintained Outcome: Progressing   Problem: Health Behavior/Discharge Planning: Goal: Ability to safely manage health-related needs after discharge will improve Outcome: Progressing   Problem: Education: Goal: Knowledge of the prescribed therapeutic regimen will improve Outcome: Progressing Goal: Understanding of discharge needs will improve Outcome: Progressing Goal: Individualized Educational Video(s) Outcome: Progressing   Problem: Clinical Measurements: Goal: Postoperative complications will be avoided or minimized Outcome: Progressing   Problem: Pain Management: Goal: Pain level will decrease with appropriate interventions Outcome: Progressing   Problem: Skin Integrity: Goal: Will show signs of wound healing Outcome: Progressing   Problem: Education: Goal: Knowledge of condition and prescribed therapy will improve Outcome: Progressing   Problem: Cardiac: Goal: Will achieve and/or maintain adequate cardiac output Outcome: Progressing   Problem: Cardiac: Goal: Will achieve and/or maintain adequate cardiac output Outcome: Progressing

## 2022-11-30 NOTE — Consult Note (Signed)
CARDIOLOGY CONSULT NOTE  Patient ID: [name] MRN: [id number] DOB/AGE: December 29, 1945 77 y.o.  Admit date: 11/27/2022 Referring Physician  Aris Everts, MD Primary Physician:  Gaspar Garbe, MD Reason for Consultation  Orthostatic hypotension and CAD  Patient ID: [name], male    DOB: 1945/09/14, 77 y.o.   MRN: [id number]  Chief Complaint  Patient presents with   Headache   Loss of Vision    Both    HPI:    [name]  is a 77 y.o. Caucasian male patient with coronary artery SP CABG in 1996, coronary stent to ostial free radial Y-graft to D1-OM1, chronic stable angina, mild aortic stenosis, hypertension, mixed hyperlipidemia, GERD, chronic back pain, recurrent syncope and dizziness, underwent loop recorder implantation on 04/09/2021. Due to chest pain and worsening dyspnea, underwent cardiac catheterization and angioplasty to distal LM and ostial and proximal CX on 12/24/2021, has a patent LIMA to LAD and free radial graft with the ostial stent to D1.  Underwent repeat cardiac catheterization on 11/25/2022 which had revealed in-stent restenosis for which he underwent Cutting Balloon angioplasty to the circumflex coronary artery.  He is now admitted to the hospital on 11/27/2022 with dizziness, visual disturbance and found to be severely hypotensive and orthostatic.  I was consulted to discuss regarding CAD management and management of orthostasis.  Patient has started to feel better since being in the hospital, there is no neurologic deficits, no chest pain he still has dyspnea with exertion activity.  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   Noted on EKG   Left axis deviation 05/2018   Noted on EKG   Lipoma 04/20/2018   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   Loop Biotronik Biomonitor III for syncope and collapse 04/09/2021 04/09/2021   LVH (left ventricular hypertrophy) 05/2018   Noted on EKG   Phimosis    Pneumonia    Syncope and collapse    Tuberculosis 1956   Wears glasses    Wheezing    Past Surgical History:  Procedure Laterality Date   ANGIOPLASTY  2010   3 stents placed   BACK SURGERY     CARDIAC CATHETERIZATION     CATARACT EXTRACTION W/ INTRAOCULAR LENS  IMPLANT, BILATERAL  2012   bilateral   CIRCUMCISION N/A 08/30/2018   Procedure: CIRCUMCISION ADULT;  Surgeon: Ihor Gully, MD;  Location: [facility];  Service: Urology;  Laterality: N/A;   COLONOSCOPY  2013, 2008   CORONARY ANGIOPLASTY     CORONARY ARTERY BYPASS GRAFT  2006   x4   CORONARY ATHERECTOMY N/A 12/24/2021   Procedure: CORONARY ATHERECTOMY;  Surgeon: Yates Decamp, MD;  Location: [facility] INVASIVE CV LAB;  Service: Cardiovascular;  Laterality: N/A;   CORONARY BALLOON ANGIOPLASTY N/A 11/25/2022   Procedure: CORONARY BALLOON ANGIOPLASTY;  Surgeon:  Yates Decamp, MD;  Location: [facility] INVASIVE CV LAB;  Service: Cardiovascular;  Laterality: N/A;   CORONARY STENT INTERVENTION N/A 12/24/2021   Procedure: CORONARY STENT INTERVENTION;  Surgeon: Yates Decamp, MD;  Location: [facility] INVASIVE CV LAB;  Service: Cardiovascular;  Laterality: N/A;   ESOPHAGOGASTRODUODENOSCOPY  10/2011   EYE SURGERY     KNEE ARTHROSCOPY  2001   LEFT HEART CATH AND CORS/GRAFTS ANGIOGRAPHY N/A 12/24/2021   Procedure: LEFT HEART CATH AND CORS/GRAFTS ANGIOGRAPHY;  Surgeon: Yates Decamp, MD;  Location: [facility] INVASIVE CV LAB;  Service: Cardiovascular;  Laterality: N/A;    LEFT HEART CATH AND CORS/GRAFTS ANGIOGRAPHY N/A 11/25/2022   Procedure: LEFT HEART CATH AND CORS/GRAFTS ANGIOGRAPHY;  Surgeon: Yates Decamp, MD;  Location: [facility] INVASIVE CV LAB;  Service: Cardiovascular;  Laterality: N/A;   LUMBAR DISC SURGERY  1998   LUMBAR LAMINECTOMY/DECOMPRESSION MICRODISCECTOMY N/A 07/13/2019   Procedure: Laminectomy and Foraminotomy - Lumbar Two-Lumbar Three - Lumbar Three-Lumbar Four;  Surgeon: Tia Alert, MD;  Location: [facility] OR;  Service: Neurosurgery;  Laterality: N/A;  Laminectomy and Foraminotomy - Lumbar Two-Lumbar Three - Lumbar Three-Lumbar Four   ROTATOR CUFF REPAIR Left 05/24/2020   TENDON RECONSTRUCTION Right 10/27/2013   Procedure: RIGHT OPEN TRICEPS REPAIR;  Surgeon: Sheral Apley, MD;  Location: [facility];  Service: Orthopedics;  Laterality: Right;   TOTAL HIP ARTHROPLASTY Left 08/05/2022   Procedure: TOTAL HIP ARTHROPLASTY;  Surgeon: Teryl Lucy, MD;  Location: [facility] ORS;  Service: Orthopedics;  Laterality: Left;   Social History   Tobacco Use   Smoking status: Never   Smokeless tobacco: Never  Substance Use Topics   Alcohol use: No    Family History  Problem Relation Age of Onset   Colon cancer Mother 58   Hypertension Mother    Colon cancer Father 27   Heart attack Brother    Heart failure Brother    Stomach cancer Neg Hx     Marital Status: Married  ROS  Review of Systems  Cardiovascular:  Positive for dyspnea on exertion. Negative for chest pain and leg swelling.  Neurological:  Positive for dizziness and loss of balance.   Objective      11/30/2022   11:44 AM 11/30/2022   10:48 AM 11/30/2022   10:30 AM  Vitals with BMI  Systolic 156 159 295  Diastolic 73 67 67  Pulse 60 65     Blood pressure (!) 156/73, pulse 60, temperature 98.5 F (36.9 C), temperature source Oral, resp. rate 20, height 5\' 7"  (1.702 m), weight 80.7 kg, SpO2 100%.   Physical Exam Neck:     Vascular: No JVD.  Cardiovascular:     Rate and  Rhythm: Normal rate and regular rhythm.     Pulses: Intact distal pulses.     Heart sounds: S1 normal and S2 normal. Murmur heard.     Early systolic murmur is present with a grade of 2/6 at the upper right sternal border.     No gallop.  Pulmonary:     Effort: Pulmonary effort is normal.     Breath sounds: Normal breath sounds.  Abdominal:     General: Bowel sounds are normal.     Palpations: Abdomen is soft.  Musculoskeletal:     Right lower leg: No edema.     Left lower leg: No edema.  Skin:    General: Skin is warm and dry.    Laboratory examination:   Recent Labs    11/27/22 1949 11/29/22 6213  11/30/22 0229  NA 128* 128* 126*  K 3.9 4.0 3.8  CL 95* 95* 95*  CO2 24 22 22   GLUCOSE 115* 127* 119*  BUN 30* 18 15  CREATININE 1.31* 1.11 1.11  CALCIUM 8.9 9.0 8.6*  GFRNONAA 56* >60 >60   estimated creatinine clearance is 55.8 mL/min (by C-G formula based on SCr of 1.11 mg/dL).     Latest Ref Rng & Units 11/30/2022    2:29 AM 11/29/2022    2:33 AM 11/27/2022    7:49 PM  CMP  Glucose 70 - 99 mg/dL 696  295  284   BUN 8 - 23 mg/dL 15  18  30    Creatinine 0.61 - 1.24 mg/dL 1.32  4.40  1.02   Sodium 135 - 145 mmol/L 126  128  128   Potassium 3.5 - 5.1 mmol/L 3.8  4.0  3.9   Chloride 98 - 111 mmol/L 95  95  95   CO2 22 - 32 mmol/L 22  22  24    Calcium 8.9 - 10.3 mg/dL 8.6  9.0  8.9   Total Protein 6.5 - 8.1 g/dL   6.3   Total Bilirubin 0.3 - 1.2 mg/dL   0.9   Alkaline Phos 38 - 126 U/L   64   AST 15 - 41 U/L   27   ALT 0 - 44 U/L   33       Latest Ref Rng & Units 11/30/2022    2:29 AM 11/29/2022    2:33 AM 11/27/2022    7:49 PM  CBC  WBC 4.0 - 10.5 K/uL 6.5  5.9  7.5   Hemoglobin 13.0 - 17.0 g/dL 8.4  8.2  8.1   Hematocrit 39.0 - 52.0 % 25.1  25.7  24.8   Platelets 150 - 400 K/uL 211  223  240    Lab Results  Component Value Date   CHOL 84 03/16/2016   HDL 32 (L) 03/16/2016   LDLCALC 39 03/16/2016   TRIG 67 03/16/2016   CHOLHDL 2.6 03/16/2016    Cardiac  Panel (last 3 results) Recent Labs    11/27/22 1949 11/27/22 2121  TROPONINIHS 99* 114*     Medications and allergies   Allergies  Allergen Reactions   Codeine Nausea And Vomiting   Guaifenesin & Derivatives Nausea And Vomiting   Terbinafine And Related Other (See Comments)    Muscle pain   Iodinated Contrast Media Itching   Penicillins Rash    Tolerates Cefazolin without problems...[employer]     Current Meds  Medication Sig   acetaminophen (TYLENOL) 500 MG tablet Take 1,000 mg by mouth every 6 (six) hours as needed for moderate pain.   aspirin 81 MG chewable tablet Chew 81 mg by mouth every evening.   Calcium Carbonate Antacid (TUMS ULTRA PO) Take 2 tablets by mouth daily as needed (acid reflux/indigestion.).   clopidogrel (PLAVIX) 75 MG tablet Take 1 tablet (75 mg total) by mouth daily.   Coenzyme Q10 (COQ10 PO) Take 300 mg by mouth 2 (two) times daily.   Cyanocobalamin (VITAMIN B-12) 5000 MCG TBDP Take 5,000 mcg by mouth every evening.   ezetimibe (ZETIA) 10 MG tablet TAKE 1/2 TABLET BY MOUTH EVERY EVENING   fish oil-omega-3 fatty acids 1000 MG capsule Take 1 g by mouth in the morning and at bedtime.   fluconazole (DIFLUCAN) 200 MG tablet Take 200 mg by mouth every Saturday.   hydrALAZINE (APRESOLINE) 25 MG tablet Take 1 tablet (25  mg total) by mouth 3 (three) times daily as needed (For SBP >140 mm Hg).   losartan (COZAAR) 25 MG tablet TAKE 1/2 TABLET BY MOUTH EVERY EVENING   metoprolol succinate (TOPROL-XL) 25 MG 24 hr tablet TAKE 1/2 TABLET BY MOUTH EVERY MORNING   Multiple Vitamin (MULTIVITAMIN) tablet Take 1 tablet by mouth in the morning. Seniors   nitroGLYCERIN (NITROSTAT) 0.4 MG SL tablet TAKE 1 TABLET EVERY 5 MINUTES AS NEEDED FOR CHEST PAIN.   pantoprazole (PROTONIX) 40 MG tablet Take 1 tablet (40 mg total) by mouth daily.   Polyethyl Glycol-Propyl Glycol (SYSTANE) 0.4-0.3 % GEL ophthalmic gel Place 1 application into both eyes at bedtime.   Propylene Glycol (SYSTANE  BALANCE) 0.6 % SOLN Place 1 drop into both eyes 3 (three) times daily as needed (dry/irritated eyes.).   pyridoxine (B-6) 100 MG tablet Take 100 mg by mouth every evening.   rosuvastatin (CRESTOR) 20 MG tablet TAKE ONE TABLET BY MOUTH EVERYDAY AT BEDTIME   traMADol (ULTRAM) 50 MG tablet Take 50 mg by mouth 3 (three) times daily as needed for moderate pain.   triamterene-hydrochlorothiazide (MAXZIDE-25) 37.5-25 MG tablet Take 0.5 tablets by mouth every morning.   vitamin C (ASCORBIC ACID) 500 MG tablet Take 500 mg by mouth daily.    Scheduled Meds:  ascorbic acid  500 mg Oral Daily   aspirin  81 mg Oral Daily   Chlorhexidine Gluconate Cloth  6 each Topical Daily   clopidogrel  75 mg Oral Daily   cyanocobalamin  5,000 mcg Oral QPM   enoxaparin (LOVENOX) injection  40 mg Subcutaneous Q24H   ezetimibe  5 mg Oral QHS   metoprolol succinate  12.5 mg Oral q morning   midodrine  2.5 mg Oral TID WC   pantoprazole  40 mg Oral Daily   polyvinyl alcohol  1 drop Both Eyes QHS   pyridoxine  100 mg Oral QHS   rosuvastatin  20 mg Oral Daily   sodium chloride flush  3 mL Intravenous Q12H   Continuous Infusions: PRN Meds:.acetaminophen, hydrALAZINE   I/O last 3 completed shifts: In: 2086.3 [P.O.:777; I.V.:1309.3] Out: -  Total I/O In: 120 [P.O.:120] Out: 350 [Urine:350]  Net IO Since Admission: 3,400.95 mL [11/30/22 1253]   Radiology:   No results found.  Cardiac Studies:   Echocardiogram 07/31/2020: Left ventricle cavity is normal in size and wall thickness. Normal global wall motion. Normal LV systolic function with EF 58%. Doppler evidence of grade I (impaired) diastolic dysfunction, normal LAP. Aortic valve leaflets number difficult to ascertain. Probably has calcified immobile left coronary cusp. Mild aortic valve stenosis. Vmax 2.0 m/sec, mean PG 9 mmHg, AVA 1.8 cm2 by continuity equation. Trace tricuspid regurgitation. Estimated pulmonary artery systolic pressure 22 mmHg. No  significant change compared to previous study on 08/12/2019.  Left Heart Catheterization 11/25/22:  Hemodynamic data: LV: 189/5, EDP 20 mmHg.  Ao 187/61, mean 114 mmHg.  No pressure gradient across the aortic valve.    RCA: Dominant vessel.  Mildly diffusely diseased.  PDA has moderate diffuse disease. LM: Calcified, moderate-sized vessel.  Mild disease is noted. LAD: Large vessel, occluded in the proximal to mid segment, there is a small D1 and a large D2 which is D1 equivalent which is occluded in the ostium.  Large SP1.  Mid to distal LAD is mildly diseased. LCx: Moderate caliber vessel, calcified.  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  Gives origin  to moderate-sized OM1 which is occluded in its ostium but has ipsilateral collaterals and TIMI I flow. LIMA to LAD patent by angiography on 12/14/2021 and not imaged this time. Free RIMA Y graft to D1 is patent, inferior limb to OM1 occluded. SVG to RCA occluded.   Intervention data: Successful but complex and difficult PCI due to difficulty in engaging the guide catheter, 4 guide catheters were utilized eventually JL 4 barely engage the left main and I was able to drop the guide catheter after I advanced the balloon   Successful Wolverine cutting balloon angioplasty of in-stent restenotic proximal CX with a 2.75 x 10 mm balloon.  Ostial and mid segment 5 treated.    Recommendation: Patient will be discharged home on aspirin indefinitely and Plavix for a period of 3 months, if he has recurrence of restenosis, we will consider placing him in a clinical trial with drug-coated balloon angioplasty.  Stents were not placed in view of high risk for recurrence.   Approximately 150 to 200 mL of blood was lost during guide catheter exchange.  Patient tolerated the procedure well.  There was no immediate complications.    EKG:  To EKG 11/27/2022: Normal sinus rhythm at rate of 60 bpm,  leftward axis.  No evidence of ischemia, normal QT interval.  Assessment   1.  Orthostatic hypotension 2.  Hyponatremia 3.  CAD of the native vessel and bypass With chronic stable angina pectoris  Recommendations:   Discussed with Dr. Carmell Austria, to discontinue all cardiac medications and reduce the dose of the metoprolol succinate to 12.5 mg daily and also discontinue losartan.  Will continue with low-dose of midodrine at 2.5 mg 3 times daily with food and once stable could potentially take him off of that as well.  With regard to hyponatremia, agree with discontinuing saline infusion, will follow-up on sodium levels once ARB is discontinued and he is placed on fluid restriction and also evaluating for SIADH.  Check BNP, if elevated, will challenge him with IV Lasix however if it is only mild or moderately elevated, will wait on workup for hyponatremia.  Remarkably on 11/13/2022, his sodium was normal prior to cardiac catheterization.  From coronary artery disease standpoint, he has remained stable, dyspnea could be related to acute on chronic diastolic heart failure.  His last echocardiogram was >2 years ago, orders placed for repeating echo.  Ambulate in the hallway as tolerated, once sodium level starts to, and blood pressure is stable, could potentially look at discharge home with outpatient follow-up.   Yates Decamp, MD, [facility] 11/30/2022, 12:53 PM Office: [phone]

## 2022-11-30 NOTE — Progress Notes (Signed)
  Echocardiogram 2D Echocardiogram has been performed.  Delcie Roch 11/30/2022, 5:24 PM

## 2022-12-01 DIAGNOSIS — R55 Syncope and collapse: Secondary | ICD-10-CM | POA: Diagnosis not present

## 2022-12-01 LAB — CBC
HCT: 26 % — ABNORMAL LOW (ref 39.0–52.0)
Hemoglobin: 8.6 g/dL — ABNORMAL LOW (ref 13.0–17.0)
MCH: 28.1 pg (ref 26.0–34.0)
MCHC: 33.1 g/dL (ref 30.0–36.0)
MCV: 85 fL (ref 80.0–100.0)
Platelets: 234 10*3/uL (ref 150–400)
RBC: 3.06 MIL/uL — ABNORMAL LOW (ref 4.22–5.81)
RDW: 17 % — ABNORMAL HIGH (ref 11.5–15.5)
WBC: 6.8 10*3/uL (ref 4.0–10.5)
nRBC: 0 % (ref 0.0–0.2)

## 2022-12-01 LAB — BASIC METABOLIC PANEL
Anion gap: 8 (ref 5–15)
BUN: 12 mg/dL (ref 8–23)
CO2: 22 mmol/L (ref 22–32)
Calcium: 8.8 mg/dL — ABNORMAL LOW (ref 8.9–10.3)
Chloride: 98 mmol/L (ref 98–111)
Creatinine, Ser: 1.15 mg/dL (ref 0.61–1.24)
GFR, Estimated: >60 mL/min (ref >60)
Glucose, Bld: 118 mg/dL — ABNORMAL HIGH (ref 70–99)
Potassium: 3.8 mmol/L (ref 3.5–5.1)
Sodium: 128 mmol/L — ABNORMAL LOW (ref 135–145)

## 2022-12-01 LAB — GLUCOSE, CAPILLARY: Glucose-Capillary: 115 mg/dL — ABNORMAL HIGH (ref 70–99)

## 2022-12-01 LAB — ACTH STIMULATION, 3 TIME POINTS
Cortisol, 30 Min: 12.7 ug/dL
Cortisol, 60 Min: 13.5 ug/dL
Cortisol, Base: 10.6 ug/dL

## 2022-12-01 LAB — BRAIN NATRIURETIC PEPTIDE: B Natriuretic Peptide: 101.7 pg/mL — ABNORMAL HIGH (ref 0.0–100.0)

## 2022-12-01 MED ORDER — FLUTICASONE PROPIONATE 50 MCG/ACT NA SUSP
1.0000 | Freq: Every day | NASAL | Status: DC
  Administered 2022-12-01 – 2022-12-02 (×2): 1 via NASAL
  Filled 2022-12-01: qty 16

## 2022-12-01 MED ORDER — SODIUM CHLORIDE 1 G PO TABS: 1.0000 g | ORAL_TABLET | Freq: Every day | ORAL | Status: DC

## 2022-12-01 MED ORDER — MIDODRINE HCL 5 MG PO TABS: 2.5000 mg | ORAL_TABLET | Freq: Three times a day (TID) | ORAL | Status: DC | PRN

## 2022-12-01 MED ORDER — HYDROCORTISONE 5 MG PO TABS
5.0000 mg | ORAL_TABLET | Freq: Every day | ORAL | Status: DC
  Administered 2022-12-01: 5 mg via ORAL
  Filled 2022-12-01 (×2): qty 1

## 2022-12-01 MED ORDER — HYDROCORTISONE 10 MG PO TABS
10.0000 mg | ORAL_TABLET | Freq: Every day | ORAL | Status: DC
  Administered 2022-12-01: 10 mg via ORAL
  Filled 2022-12-01 (×2): qty 1

## 2022-12-01 NOTE — Evaluation (Signed)
Physical Therapy Evaluation Patient Details Name: [name] MRN: [id number] DOB: 1945/03/28 Today's Date: 12/01/2022  History of Present Illness  77 y.o. male presents to [facility] hospital on 11/27/2022 with HA, confusion, chest tightness and dizziness. Pt noted to have orthostatic BP in ED. Pt recently underwent LHC on 9/17. PMH includes HTN, CABG, PAH, GERD, CKD III, HLD.  Clinical Impression  Pt presents to PT with deficits in functional mobility, gait, balance, endurance. Pt is limited by symptomatic orthostatic hypotension, reporting a headache as well as a kaleidoscope-like effect on the pt's vision. Pt with a leftward loss of balance when initiating gait training during session but otherwise stable, PT limits distance due to hypotension. Pt was wearing compression stockings, and abdominal binder was donned for later portion of this evaluation. Pt will benefit from continued frequent mobilization in an effort to improve tolerance to activity and to reduce falls risk.        If plan is discharge home, recommend the following: A little help with walking and/or transfers;A little help with bathing/dressing/bathroom;Assistance with cooking/housework;Assist for transportation;Help with stairs or ramp for entrance   Can travel by private vehicle        Equipment Recommendations None recommended by PT  Recommendations for Other Services       Functional Status Assessment Patient has had a recent decline in their functional status and demonstrates the ability to make significant improvements in function in a reasonable and predictable amount of time.     Precautions / Restrictions Precautions Precautions: Fall Precaution Comments: orthostatic hypotension Restrictions Weight Bearing Restrictions: No      Mobility  Bed Mobility Overal bed mobility: Modified Independent                  Transfers Overall transfer level: Needs assistance Equipment used: Straight  cane Transfers: Sit to/from Stand Sit to Stand: Contact guard assist                Ambulation/Gait Ambulation/Gait assistance: Contact guard assist Gait Distance (Feet): 100 Feet Assistive device: Straight cane Gait Pattern/deviations: Step-through pattern, Staggering left Gait velocity: reduced Gait velocity interpretation: <1.8 ft/sec, indicate of risk for recurrent falls   General Gait Details: pt with leftward loss of balance when initiating gait, steady after initial loss of balance despite symptoms when mobilizing  Stairs            Wheelchair Mobility     Tilt Bed    Modified Rankin (Stroke Patients Only)       Balance Overall balance assessment: Needs assistance Sitting-balance support: No upper extremity supported, Feet supported Sitting balance-Leahy Scale: Good     Standing balance support: Single extremity supported, Reliant on assistive device for balance Standing balance-Leahy Scale: Poor                               Pertinent Vitals/Pain Pain Assessment Pain Assessment: Faces Faces Pain Scale: Hurts little more Pain Location: head Pain Descriptors / Indicators: Headache Pain Intervention(s): Monitored during session    Home Living Family/patient expects to be discharged to:: Private residence Living Arrangements: Spouse/significant other;Other relatives Available Help at Discharge: Family;Available 24 hours/day Type of Home: House Home Access: Stairs to enter Entrance Stairs-Rails: Right Entrance Stairs-Number of Steps: 3   Home Layout: One level Home Equipment: Cane - single Librarian, academic (2 wheels);BSC/3in1;Shower seat      Prior Function Prior Level of Function : Independent/Modified Independent  Mobility Comments: ambulates without DME in the home, [facility] for community mobility. Still active in carpentry       Extremity/Trunk Assessment   Upper Extremity Assessment Upper Extremity  Assessment: Overall WFL for tasks assessed    Lower Extremity Assessment Lower Extremity Assessment: Generalized weakness    Cervical / Trunk Assessment Cervical / Trunk Assessment: Kyphotic  Communication   Communication Communication: No apparent difficulties Cueing Techniques: Verbal cues  Cognition Arousal: Alert Behavior During Therapy: WFL for tasks assessed/performed Overall Cognitive Status: Within Functional Limits for tasks assessed                                          General Comments General comments (skin integrity, edema, etc.): orthostatic vitals documented in vitals flowsheet. Positive orthostatics, symptomatic    Exercises     Assessment/Plan    PT Assessment Patient needs continued PT services  PT Problem List Decreased strength;Decreased activity tolerance;Decreased balance;Decreased mobility;Decreased safety awareness       PT Treatment Interventions Gait training;DME instruction;Stair training;Functional mobility training;Therapeutic activities;Therapeutic exercise;Balance training;Neuromuscular re-education;Patient/family education;Cognitive remediation    PT Goals (Current goals can be found in the Care Plan section)  Acute Rehab PT Goals Patient Stated Goal: to go home PT Goal Formulation: With patient/family Time For Goal Achievement: 12/15/22 Potential to Achieve Goals: Good    Frequency Min 1X/week     Co-evaluation               AM-PAC PT "6 Clicks" Mobility  Outcome Measure Help needed turning from your back to your side while in a flat bed without using bedrails?: None Help needed moving from lying on your back to sitting on the side of a flat bed without using bedrails?: None Help needed moving to and from a bed to a chair (including a wheelchair)?: A Little Help needed standing up from a chair using your arms (e.g., wheelchair or bedside chair)?: A Little Help needed to walk in hospital room?: A  Little Help needed climbing 3-5 steps with a railing? : A Lot 6 Click Score: 19    End of Session Equipment Utilized During Treatment: Gait belt Activity Tolerance: Treatment limited secondary to medical complications (Comment) (orthostatic hypotension) Patient left: in bed;with call bell/phone within reach;with family/visitor present Nurse Communication: Mobility status PT Visit Diagnosis: Other abnormalities of gait and mobility (R26.89);Muscle weakness (generalized) (M62.81)    Time: 0981-1914 PT Time Calculation (min) (ACUTE ONLY): 28 min   Charges:   PT Evaluation $PT Eval Low Complexity: 1 Low   PT General Charges $$ ACUTE PT VISIT: 1 Visit         Arlyss Gandy, PT, DPT Acute Rehabilitation Office [phone]   Arlyss Gandy 12/01/2022, 3:27 PM

## 2022-12-01 NOTE — Progress Notes (Signed)
PROGRESS NOTE    [name]  WUJ:[id number] DOB: 09/02/1945 DOA: 11/27/2022 PCP: Gaspar Garbe, MD   Brief Narrative: 77 year old with past medical history significant for hypertension, CAD status post CABG, pulmonary hypertension, GERD, CKD 3A, hyperlipidemia presents complaining of headache and dizziness.  Of note patient underwent went left heart catheterization 11/25/2022 for recurrent angina and underwent balloon angioplasty at that time with some 150 to 200 mL of blood loss.  He presented to the hospital with some confusion, slurred speech, chest tightness and tunnel vision with headache.  He was also noted to be bradycardic.  Orthostatic vitals were positive in the ED.  Hemoglobin 8.1 from previous 9-10.  Rectal exam deferred FOBT.  Hyponatremia with a sodium level of 128.     Assessment & Plan:   Principal Problem:   Pre-syncope Active Problems:   HTN (hypertension)   Hx of CABG   Elevated troponin   CKD stage 3a, GFR 45-59 ml/min (HCC)   CAD S/P percutaneous coronary angioplasty   Syncope   1-Pre-Syncope, Orthostatic hypotension.  -Plan to Hold BP medications: losartan, Maxzide.  -Discussed with Dr Jacinto Halim, hold BP medications, and do PRN midodrine.  -he received IV fluids.  -TSH normal. , cortisol low at 5--ACTH test consistent with adrenal insufficiency.  -Plan to start Hydrocortisone. Check orthostatic vitals tomorrow.   Acute on chronic Hyponatremia History of Hyponatremia sodium 130---134.  2 weeks ago sodium was normal.  Urine osmolality: 269 , urine sodium: 58 Hold IV fluids.  Discontinue: Triamterene hydrochlorothiazide.  TSH: 1.9 , Cortisol low.  Suspect related to Adrenal insufficiency.  Start Hydrocortisone.  Will continue with water restriction   CAD S/P percutaneous coronary angioplasty Elevated troponin -Status post remote CABG -Left heart cath 9/17 for stent stenosis of Lcx  status post balloon angioplasty -Continue with aspirin and  Plavix -Continue with metoprolol.   CKD stage IIIa: -stable,. Cr baseline 1.3   Hypertension, currently with orthostatic hypotension -Cardiology recommended Midodrine.  -hold meds.    Headaches, Dizziness;  Complaints of headaches, dizziness. Dizziness could be explain by orthistatic hypotension.  MRI negative for stroke.  Headaches could be related to sinusitis. Started flonase.   Estimated body mass index is 27.83 kg/m as calculated from the following:   Height as of this encounter: 5\' 7"  (1.702 m).   Weight as of this encounter: 80.6 kg.   DVT prophylaxis: Lovenox Code Status: Full Code Family Communication: Wife at bedsdie.  Disposition Plan:  Status is: Inpatient Remains inpatient appropriate because: management of Hyponatremia. Orthostatic hypotension.     Consultants:  Dr Jacinto Halim  Procedures:  none  Antimicrobials:    Subjective: He is alert, conversant. Report frontal headaches  He is feeling better today.   Objective: Vitals:   11/30/22 1721 11/30/22 1959 12/01/22 0003 12/01/22 0516  BP: 136/71 130/70 (!) 111/53 (!) 108/53  Pulse:  62 66 69  Resp:  20 20 19   Temp:  97.8 F (36.6 C) 97.8 F (36.6 C) 98.2 F (36.8 C)  TempSrc:  Oral Oral Oral  SpO2:  100% 100% 97%  Weight:    80.6 kg  Height:        Intake/Output Summary (Last 24 hours) at 12/01/2022 0720 Last data filed at 12/01/2022 0524 Gross per 24 hour  Intake 120 ml  Output 2550 ml  Net -2430 ml   Filed Weights   11/29/22 0625 11/30/22 0245 12/01/22 0516  Weight: 80.9 kg 80.7 kg 80.6 kg    Examination:  General  PROGRESS NOTE    [name]  WUJ:[id number] DOB: 09/02/1945 DOA: 11/27/2022 PCP: Gaspar Garbe, MD   Brief Narrative: 77 year old with past medical history significant for hypertension, CAD status post CABG, pulmonary hypertension, GERD, CKD 3A, hyperlipidemia presents complaining of headache and dizziness.  Of note patient underwent went left heart catheterization 11/25/2022 for recurrent angina and underwent balloon angioplasty at that time with some 150 to 200 mL of blood loss.  He presented to the hospital with some confusion, slurred speech, chest tightness and tunnel vision with headache.  He was also noted to be bradycardic.  Orthostatic vitals were positive in the ED.  Hemoglobin 8.1 from previous 9-10.  Rectal exam deferred FOBT.  Hyponatremia with a sodium level of 128.     Assessment & Plan:   Principal Problem:   Pre-syncope Active Problems:   HTN (hypertension)   Hx of CABG   Elevated troponin   CKD stage 3a, GFR 45-59 ml/min (HCC)   CAD S/P percutaneous coronary angioplasty   Syncope   1-Pre-Syncope, Orthostatic hypotension.  -Plan to Hold BP medications: losartan, Maxzide.  -Discussed with Dr Jacinto Halim, hold BP medications, and do PRN midodrine.  -he received IV fluids.  -TSH normal. , cortisol low at 5--ACTH test consistent with adrenal insufficiency.  -Plan to start Hydrocortisone. Check orthostatic vitals tomorrow.   Acute on chronic Hyponatremia History of Hyponatremia sodium 130---134.  2 weeks ago sodium was normal.  Urine osmolality: 269 , urine sodium: 58 Hold IV fluids.  Discontinue: Triamterene hydrochlorothiazide.  TSH: 1.9 , Cortisol low.  Suspect related to Adrenal insufficiency.  Start Hydrocortisone.  Will continue with water restriction   CAD S/P percutaneous coronary angioplasty Elevated troponin -Status post remote CABG -Left heart cath 9/17 for stent stenosis of Lcx  status post balloon angioplasty -Continue with aspirin and  Plavix -Continue with metoprolol.   CKD stage IIIa: -stable,. Cr baseline 1.3   Hypertension, currently with orthostatic hypotension -Cardiology recommended Midodrine.  -hold meds.    Headaches, Dizziness;  Complaints of headaches, dizziness. Dizziness could be explain by orthistatic hypotension.  MRI negative for stroke.  Headaches could be related to sinusitis. Started flonase.   Estimated body mass index is 27.83 kg/m as calculated from the following:   Height as of this encounter: 5\' 7"  (1.702 m).   Weight as of this encounter: 80.6 kg.   DVT prophylaxis: Lovenox Code Status: Full Code Family Communication: Wife at bedsdie.  Disposition Plan:  Status is: Inpatient Remains inpatient appropriate because: management of Hyponatremia. Orthostatic hypotension.     Consultants:  Dr Jacinto Halim  Procedures:  none  Antimicrobials:    Subjective: He is alert, conversant. Report frontal headaches  He is feeling better today.   Objective: Vitals:   11/30/22 1721 11/30/22 1959 12/01/22 0003 12/01/22 0516  BP: 136/71 130/70 (!) 111/53 (!) 108/53  Pulse:  62 66 69  Resp:  20 20 19   Temp:  97.8 F (36.6 C) 97.8 F (36.6 C) 98.2 F (36.8 C)  TempSrc:  Oral Oral Oral  SpO2:  100% 100% 97%  Weight:    80.6 kg  Height:        Intake/Output Summary (Last 24 hours) at 12/01/2022 0720 Last data filed at 12/01/2022 0524 Gross per 24 hour  Intake 120 ml  Output 2550 ml  Net -2430 ml   Filed Weights   11/29/22 0625 11/30/22 0245 12/01/22 0516  Weight: 80.9 kg 80.7 kg 80.6 kg    Examination:  General  EUA). This EUA will remain in effect (meaning this test can be used) for the duration of the COVID-19 declaration under Section 564(b)(1) of the Act, 21 U.S.C. section 360bbb-3(b)(1), unless the authorization is terminated or revoked.     Resp Syncytial Virus by PCR NEGATIVE NEGATIVE Final    Comment: (NOTE) Fact Sheet for Patients: BloggerCourse.com  Fact Sheet for Healthcare Providers: SeriousBroker.it  This test is not yet approved or cleared by the [place] FDA and has been authorized for detection and/or diagnosis of SARS-CoV-2 by FDA under an Emergency Use Authorization (EUA). This EUA will remain in effect (meaning this test can be used) for the duration of the COVID-19 declaration under Section 564(b)(1) of the Act, 21 U.S.C. section 360bbb-3(b)(1), unless the authorization is terminated or revoked.  Performed at [facility], [address] [place], [place] [address]   MRSA Next Gen by PCR, Nasal     Status: None   Collection Time: 11/28/22  5:15 AM   Specimen:  Nasal Mucosa; Nasal Swab  Result Value Ref Range Status   MRSA by PCR Next Gen NOT DETECTED NOT DETECTED Final    Comment: (NOTE) The GeneXpert MRSA Assay (FDA approved for NASAL specimens only), is one component of a comprehensive MRSA colonization surveillance program. It is not intended to diagnose MRSA infection nor to guide or monitor treatment for MRSA infections. Test performance is not FDA approved in patients less than 55 years old. Performed at [facility], [address], [place], [place] [address]          Radiology Studies: ECHOCARDIOGRAM COMPLETE  Result Date: 11/30/2022    ECHOCARDIOGRAM REPORT   Patient Name:   [name] Date of Exam: 11/30/2022 Medical Rec #:  [id number]       Height:       67.0 in Accession #:    [id number]      Weight:       177.9 lb Date of Birth:  1945-03-31       BSA:          1.924 m Patient Age:    77 years        BP:           144/77 mmHg Patient Gender: M               HR:           57 bpm. Exam Location:  Inpatient Procedure: 2D Echo, Cardiac Doppler and Color Doppler Indications:    CAD of native vessel  History:        Patient has no prior history of Echocardiogram examinations.                 Prior CABG, chronic kidney disease; Risk Factors:Hypertension                 and Dyslipidemia.  Sonographer:    Delcie Roch RDCS Referring Phys: [id number] IMPRESSIONS  1. Left ventricular ejection fraction, by estimation, is 60 to 65%. The left ventricle has normal function. The left ventricle has no regional wall motion abnormalities. There is mild concentric left ventricular hypertrophy. Left ventricular diastolic parameters are consistent with Grade I diastolic dysfunction (impaired relaxation).  2. Right ventricular systolic function is normal. The right ventricular size is normal. There is normal pulmonary artery systolic pressure. The estimated right ventricular systolic pressure is 25.8 mmHg.  3. Left atrial size was mildly  dilated.  4. The  EUA). This EUA will remain in effect (meaning this test can be used) for the duration of the COVID-19 declaration under Section 564(b)(1) of the Act, 21 U.S.C. section 360bbb-3(b)(1), unless the authorization is terminated or revoked.     Resp Syncytial Virus by PCR NEGATIVE NEGATIVE Final    Comment: (NOTE) Fact Sheet for Patients: BloggerCourse.com  Fact Sheet for Healthcare Providers: SeriousBroker.it  This test is not yet approved or cleared by the [place] FDA and has been authorized for detection and/or diagnosis of SARS-CoV-2 by FDA under an Emergency Use Authorization (EUA). This EUA will remain in effect (meaning this test can be used) for the duration of the COVID-19 declaration under Section 564(b)(1) of the Act, 21 U.S.C. section 360bbb-3(b)(1), unless the authorization is terminated or revoked.  Performed at [facility], [address] [place], [place] [address]   MRSA Next Gen by PCR, Nasal     Status: None   Collection Time: 11/28/22  5:15 AM   Specimen:  Nasal Mucosa; Nasal Swab  Result Value Ref Range Status   MRSA by PCR Next Gen NOT DETECTED NOT DETECTED Final    Comment: (NOTE) The GeneXpert MRSA Assay (FDA approved for NASAL specimens only), is one component of a comprehensive MRSA colonization surveillance program. It is not intended to diagnose MRSA infection nor to guide or monitor treatment for MRSA infections. Test performance is not FDA approved in patients less than 55 years old. Performed at [facility], [address], [place], [place] [address]          Radiology Studies: ECHOCARDIOGRAM COMPLETE  Result Date: 11/30/2022    ECHOCARDIOGRAM REPORT   Patient Name:   [name] Date of Exam: 11/30/2022 Medical Rec #:  [id number]       Height:       67.0 in Accession #:    [id number]      Weight:       177.9 lb Date of Birth:  1945-03-31       BSA:          1.924 m Patient Age:    77 years        BP:           144/77 mmHg Patient Gender: M               HR:           57 bpm. Exam Location:  Inpatient Procedure: 2D Echo, Cardiac Doppler and Color Doppler Indications:    CAD of native vessel  History:        Patient has no prior history of Echocardiogram examinations.                 Prior CABG, chronic kidney disease; Risk Factors:Hypertension                 and Dyslipidemia.  Sonographer:    Delcie Roch RDCS Referring Phys: [id number] IMPRESSIONS  1. Left ventricular ejection fraction, by estimation, is 60 to 65%. The left ventricle has normal function. The left ventricle has no regional wall motion abnormalities. There is mild concentric left ventricular hypertrophy. Left ventricular diastolic parameters are consistent with Grade I diastolic dysfunction (impaired relaxation).  2. Right ventricular systolic function is normal. The right ventricular size is normal. There is normal pulmonary artery systolic pressure. The estimated right ventricular systolic pressure is 25.8 mmHg.  3. Left atrial size was mildly  dilated.  4. The  EUA). This EUA will remain in effect (meaning this test can be used) for the duration of the COVID-19 declaration under Section 564(b)(1) of the Act, 21 U.S.C. section 360bbb-3(b)(1), unless the authorization is terminated or revoked.     Resp Syncytial Virus by PCR NEGATIVE NEGATIVE Final    Comment: (NOTE) Fact Sheet for Patients: BloggerCourse.com  Fact Sheet for Healthcare Providers: SeriousBroker.it  This test is not yet approved or cleared by the [place] FDA and has been authorized for detection and/or diagnosis of SARS-CoV-2 by FDA under an Emergency Use Authorization (EUA). This EUA will remain in effect (meaning this test can be used) for the duration of the COVID-19 declaration under Section 564(b)(1) of the Act, 21 U.S.C. section 360bbb-3(b)(1), unless the authorization is terminated or revoked.  Performed at [facility], [address] [place], [place] [address]   MRSA Next Gen by PCR, Nasal     Status: None   Collection Time: 11/28/22  5:15 AM   Specimen:  Nasal Mucosa; Nasal Swab  Result Value Ref Range Status   MRSA by PCR Next Gen NOT DETECTED NOT DETECTED Final    Comment: (NOTE) The GeneXpert MRSA Assay (FDA approved for NASAL specimens only), is one component of a comprehensive MRSA colonization surveillance program. It is not intended to diagnose MRSA infection nor to guide or monitor treatment for MRSA infections. Test performance is not FDA approved in patients less than 55 years old. Performed at [facility], [address], [place], [place] [address]          Radiology Studies: ECHOCARDIOGRAM COMPLETE  Result Date: 11/30/2022    ECHOCARDIOGRAM REPORT   Patient Name:   [name] Date of Exam: 11/30/2022 Medical Rec #:  [id number]       Height:       67.0 in Accession #:    [id number]      Weight:       177.9 lb Date of Birth:  1945-03-31       BSA:          1.924 m Patient Age:    77 years        BP:           144/77 mmHg Patient Gender: M               HR:           57 bpm. Exam Location:  Inpatient Procedure: 2D Echo, Cardiac Doppler and Color Doppler Indications:    CAD of native vessel  History:        Patient has no prior history of Echocardiogram examinations.                 Prior CABG, chronic kidney disease; Risk Factors:Hypertension                 and Dyslipidemia.  Sonographer:    Delcie Roch RDCS Referring Phys: [id number] IMPRESSIONS  1. Left ventricular ejection fraction, by estimation, is 60 to 65%. The left ventricle has normal function. The left ventricle has no regional wall motion abnormalities. There is mild concentric left ventricular hypertrophy. Left ventricular diastolic parameters are consistent with Grade I diastolic dysfunction (impaired relaxation).  2. Right ventricular systolic function is normal. The right ventricular size is normal. There is normal pulmonary artery systolic pressure. The estimated right ventricular systolic pressure is 25.8 mmHg.  3. Left atrial size was mildly  dilated.  4. The  PROGRESS NOTE    [name]  WUJ:[id number] DOB: 09/02/1945 DOA: 11/27/2022 PCP: Gaspar Garbe, MD   Brief Narrative: 77 year old with past medical history significant for hypertension, CAD status post CABG, pulmonary hypertension, GERD, CKD 3A, hyperlipidemia presents complaining of headache and dizziness.  Of note patient underwent went left heart catheterization 11/25/2022 for recurrent angina and underwent balloon angioplasty at that time with some 150 to 200 mL of blood loss.  He presented to the hospital with some confusion, slurred speech, chest tightness and tunnel vision with headache.  He was also noted to be bradycardic.  Orthostatic vitals were positive in the ED.  Hemoglobin 8.1 from previous 9-10.  Rectal exam deferred FOBT.  Hyponatremia with a sodium level of 128.     Assessment & Plan:   Principal Problem:   Pre-syncope Active Problems:   HTN (hypertension)   Hx of CABG   Elevated troponin   CKD stage 3a, GFR 45-59 ml/min (HCC)   CAD S/P percutaneous coronary angioplasty   Syncope   1-Pre-Syncope, Orthostatic hypotension.  -Plan to Hold BP medications: losartan, Maxzide.  -Discussed with Dr Jacinto Halim, hold BP medications, and do PRN midodrine.  -he received IV fluids.  -TSH normal. , cortisol low at 5--ACTH test consistent with adrenal insufficiency.  -Plan to start Hydrocortisone. Check orthostatic vitals tomorrow.   Acute on chronic Hyponatremia History of Hyponatremia sodium 130---134.  2 weeks ago sodium was normal.  Urine osmolality: 269 , urine sodium: 58 Hold IV fluids.  Discontinue: Triamterene hydrochlorothiazide.  TSH: 1.9 , Cortisol low.  Suspect related to Adrenal insufficiency.  Start Hydrocortisone.  Will continue with water restriction   CAD S/P percutaneous coronary angioplasty Elevated troponin -Status post remote CABG -Left heart cath 9/17 for stent stenosis of Lcx  status post balloon angioplasty -Continue with aspirin and  Plavix -Continue with metoprolol.   CKD stage IIIa: -stable,. Cr baseline 1.3   Hypertension, currently with orthostatic hypotension -Cardiology recommended Midodrine.  -hold meds.    Headaches, Dizziness;  Complaints of headaches, dizziness. Dizziness could be explain by orthistatic hypotension.  MRI negative for stroke.  Headaches could be related to sinusitis. Started flonase.   Estimated body mass index is 27.83 kg/m as calculated from the following:   Height as of this encounter: 5\' 7"  (1.702 m).   Weight as of this encounter: 80.6 kg.   DVT prophylaxis: Lovenox Code Status: Full Code Family Communication: Wife at bedsdie.  Disposition Plan:  Status is: Inpatient Remains inpatient appropriate because: management of Hyponatremia. Orthostatic hypotension.     Consultants:  Dr Jacinto Halim  Procedures:  none  Antimicrobials:    Subjective: He is alert, conversant. Report frontal headaches  He is feeling better today.   Objective: Vitals:   11/30/22 1721 11/30/22 1959 12/01/22 0003 12/01/22 0516  BP: 136/71 130/70 (!) 111/53 (!) 108/53  Pulse:  62 66 69  Resp:  20 20 19   Temp:  97.8 F (36.6 C) 97.8 F (36.6 C) 98.2 F (36.8 C)  TempSrc:  Oral Oral Oral  SpO2:  100% 100% 97%  Weight:    80.6 kg  Height:        Intake/Output Summary (Last 24 hours) at 12/01/2022 0720 Last data filed at 12/01/2022 0524 Gross per 24 hour  Intake 120 ml  Output 2550 ml  Net -2430 ml   Filed Weights   11/29/22 0625 11/30/22 0245 12/01/22 0516  Weight: 80.9 kg 80.7 kg 80.6 kg    Examination:  General

## 2022-12-01 NOTE — Progress Notes (Signed)
Mobility Specialist Progress Note:   12/01/22 1011  Therapy Vitals  Pulse Rate 60  BP 139/68  Mobility  Activity Ambulated with assistance in hallway  Level of Assistance Contact guard assist, steadying assist  Assistive Device Cane  Distance Ambulated (ft) 160 ft  Activity Response Tolerated well  Mobility Referral Yes  $Mobility charge 1 Mobility  Mobility Specialist Start Time (ACUTE ONLY) 1024  Mobility Specialist Stop Time (ACUTE ONLY) 1038  Mobility Specialist Time Calculation (min) (ACUTE ONLY) 14 min    Pre Mobility: 59 HR, 99% SpO2 During Mobility: 74 HR,  95% SpO2 Post Mobility:  69 HR,  99% SpO2  Pt received in bed, agreeable to mobility. Had x1 LOB, corrected with minA. Otherwise asymptomatic w/ no complaints. Pt left in bed with call bell and all needs met. Family present.  D'Vante Earlene Plater Mobility Specialist Please contact via Special educational needs teacher or Rehab office at [phone]

## 2022-12-01 NOTE — Plan of Care (Signed)
  Problem: Education: Goal: Understanding of CV disease, CV risk reduction, and recovery process will improve Outcome: Progressing Goal: Individualized Educational Video(s) Outcome: Progressing   Problem: Activity: Goal: Ability to return to baseline activity level will improve Outcome: Progressing   Problem: Cardiovascular: Goal: Ability to achieve and maintain adequate cardiovascular perfusion will improve Outcome: Progressing Goal: Vascular access site(s) Level 0-1 will be maintained Outcome: Progressing   Problem: Health Behavior/Discharge Planning: Goal: Ability to safely manage health-related needs after discharge will improve Outcome: Progressing   Problem: Education: Goal: Knowledge of the prescribed therapeutic regimen will improve Outcome: Progressing Goal: Understanding of discharge needs will improve Outcome: Progressing Goal: Individualized Educational Video(s) Outcome: Progressing   Problem: Activity: Goal: Ability to avoid complications of mobility impairment will improve Outcome: Progressing Goal: Ability to tolerate increased activity will improve Outcome: Progressing   Problem: Clinical Measurements: Goal: Postoperative complications will be avoided or minimized Outcome: Progressing   Problem: Pain Management: Goal: Pain level will decrease with appropriate interventions Outcome: Progressing   Problem: Skin Integrity: Goal: Will show signs of wound healing Outcome: Progressing   Problem: Education: Goal: Knowledge of condition and prescribed therapy will improve Outcome: Progressing   Problem: Cardiac: Goal: Will achieve and/or maintain adequate cardiac output Outcome: Progressing   Problem: Physical Regulation: Goal: Complications related to the disease process, condition or treatment will be avoided or minimized Outcome: Progressing   Problem: Education: Goal: Knowledge of General Education information will improve Description: Including  pain rating scale, medication(s)/side effects and non-pharmacologic comfort measures Outcome: Progressing   Problem: Health Behavior/Discharge Planning: Goal: Ability to manage health-related needs will improve Outcome: Progressing   Problem: Clinical Measurements: Goal: Ability to maintain clinical measurements within normal limits will improve Outcome: Progressing Goal: Will remain free from infection Outcome: Progressing Goal: Diagnostic test results will improve Outcome: Progressing Goal: Respiratory complications will improve Outcome: Progressing Goal: Cardiovascular complication will be avoided Outcome: Progressing   Problem: Activity: Goal: Risk for activity intolerance will decrease Outcome: Progressing   Problem: Nutrition: Goal: Adequate nutrition will be maintained Outcome: Progressing   Problem: Coping: Goal: Level of anxiety will decrease Outcome: Progressing   Problem: Elimination: Goal: Will not experience complications related to bowel motility Outcome: Progressing Goal: Will not experience complications related to urinary retention Outcome: Progressing   Problem: Pain Managment: Goal: General experience of comfort will improve Outcome: Progressing   Problem: Safety: Goal: Ability to remain free from injury will improve Outcome: Progressing   Problem: Skin Integrity: Goal: Risk for impaired skin integrity will decrease Outcome: Progressing

## 2022-12-02 DIAGNOSIS — R55 Syncope and collapse: Secondary | ICD-10-CM | POA: Diagnosis not present

## 2022-12-02 LAB — BASIC METABOLIC PANEL WITH GFR
Anion gap: 11 (ref 5–15)
BUN: 15 mg/dL (ref 8–23)
CO2: 23 mmol/L (ref 22–32)
Calcium: 9.1 mg/dL (ref 8.9–10.3)
Chloride: 97 mmol/L — ABNORMAL LOW (ref 98–111)
Creatinine, Ser: 1.21 mg/dL (ref 0.61–1.24)
GFR, Estimated: >60 mL/min
Glucose, Bld: 129 mg/dL — ABNORMAL HIGH (ref 70–99)
Potassium: 3.9 mmol/L (ref 3.5–5.1)
Sodium: 131 mmol/L — ABNORMAL LOW (ref 135–145)

## 2022-12-02 LAB — CBC
HCT: 26.6 % — ABNORMAL LOW (ref 39.0–52.0)
Hemoglobin: 8.4 g/dL — ABNORMAL LOW (ref 13.0–17.0)
MCH: 26.5 pg (ref 26.0–34.0)
MCHC: 31.6 g/dL (ref 30.0–36.0)
MCV: 83.9 fL (ref 80.0–100.0)
Platelets: 228 10*3/uL (ref 150–400)
RBC: 3.17 MIL/uL — ABNORMAL LOW (ref 4.22–5.81)
RDW: 17 % — ABNORMAL HIGH (ref 11.5–15.5)
WBC: 5.5 10*3/uL (ref 4.0–10.5)
nRBC: 0 % (ref 0.0–0.2)

## 2022-12-02 LAB — GLUCOSE, CAPILLARY: Glucose-Capillary: 108 mg/dL — ABNORMAL HIGH (ref 70–99)

## 2022-12-02 MED ORDER — HYDROCORTISONE 10 MG PO TABS
10.0000 mg | ORAL_TABLET | Freq: Every day | ORAL | Status: DC
  Administered 2022-12-02: 10 mg via ORAL
  Filled 2022-12-02: qty 1

## 2022-12-02 MED ORDER — POLYETHYLENE GLYCOL 3350 17 G PO PACK
17.0000 g | PACK | Freq: Every day | ORAL | Status: DC
  Administered 2022-12-02: 17 g via ORAL
  Filled 2022-12-02: qty 1

## 2022-12-02 MED ORDER — HYDROCORTISONE 10 MG PO TABS: 10.0000 mg | ORAL_TABLET | Freq: Every morning | ORAL | 1 refills | Status: AC

## 2022-12-02 MED ORDER — FERROUS SULFATE 325 (65 FE) MG PO TABS: 325.0000 mg | ORAL_TABLET | Freq: Every day | ORAL | Status: DC

## 2022-12-02 MED ORDER — FOLIC ACID 1 MG PO TABS
1.0000 mg | ORAL_TABLET | Freq: Every day | ORAL | Status: DC
  Administered 2022-12-02: 1 mg via ORAL
  Filled 2022-12-02: qty 1

## 2022-12-02 MED ORDER — FERROUS SULFATE 325 (65 FE) MG PO TABS: 325.0000 mg | ORAL_TABLET | Freq: Every day | ORAL | 3 refills | Status: AC

## 2022-12-02 MED ORDER — MIDODRINE HCL 2.5 MG PO TABS: 2.5000 mg | ORAL_TABLET | Freq: Three times a day (TID) | ORAL | 0 refills | Status: AC | PRN

## 2022-12-02 MED ORDER — FOLIC ACID 1 MG PO TABS: 1.0000 mg | ORAL_TABLET | Freq: Every day | ORAL | 0 refills | Status: DC

## 2022-12-02 MED ORDER — HYDROCORTISONE 5 MG PO TABS: 5.0000 mg | ORAL_TABLET | Freq: Every day | ORAL | 1 refills | Status: AC

## 2022-12-02 MED ORDER — FLUTICASONE PROPIONATE 50 MCG/ACT NA SUSP: 1.0000 | Freq: Every day | NASAL | 2 refills | Status: DC

## 2022-12-02 NOTE — Progress Notes (Signed)
Went over discharge paper work with patient and wife. All questions answered. PIV and telemetry removed. All belongings at bedside.

## 2022-12-02 NOTE — Progress Notes (Signed)
Physical Therapy Treatment Patient Details Name: [name] MRN: [id number] DOB: Nov 19, 1945 Today's Date: 12/02/2022   History of Present Illness 77 y.o. male [facility] to [facility] hospital on 11/27/2022 with HA, confusion, chest tightness and dizziness. Pt noted to have orthostatic BP in ED. Pt recently underwent LHC on 9/17. PMH includes HTN, CABG, PAH, GERD, CKD III, HLD.    PT Comments  Pt received in supine and agreeable to session. Pt reporting no adverse symptoms throughout session and is able to tolerate increased gait distance. Education provided on energy conservation and safety strategies for mobility to reduce fall risk if pt experiences BP issues at home. Pt and wife report no other concerns about returning home. Acutely, pt continues to benefit from PT services to progress toward functional mobility goals.     If plan is discharge home, recommend the following: A little help with walking and/or transfers;A little help with bathing/dressing/bathroom;Assistance with cooking/housework;Assist for transportation;Help with stairs or ramp for entrance   Can travel by private vehicle        Equipment Recommendations  None recommended by PT    Recommendations for Other Services       Precautions / Restrictions Precautions Precautions: Fall Precaution Comments: orthostatic hypotension Restrictions Weight Bearing Restrictions: No     Mobility  Bed Mobility Overal bed mobility: Modified Independent                  Transfers Overall transfer level: Needs assistance Equipment used: Straight cane Transfers: Sit to/from Stand Sit to Stand: Supervision           General transfer comment: from low EOB    Ambulation/Gait Ambulation/Gait assistance: Contact guard assist Gait Distance (Feet): 200 Feet Assistive device: Straight cane Gait Pattern/deviations: Step-through pattern Gait velocity: reduced     General Gait Details: slow, steady gait with SPC. CGA for  safety and pt reporting no dizziness or headache       Balance Overall balance assessment: Needs assistance Sitting-balance support: No upper extremity supported, Feet supported Sitting balance-Leahy Scale: Good Sitting balance - Comments: sitting EOB   Standing balance support: Single extremity supported, Reliant on assistive device for balance, During functional activity Standing balance-Leahy Scale: Fair Standing balance comment: with SPC support                            Cognition Arousal: Alert Behavior During Therapy: WFL for tasks assessed/performed Overall Cognitive Status: Within Functional Limits for tasks assessed                                          Exercises      General Comments General comments (skin integrity, edema, etc.): Pt reporting no adverse symptoms throughout      Pertinent Vitals/Pain Pain Assessment Pain Assessment: No/denies pain     PT Goals (current goals can now be found in the care plan section) Acute Rehab PT Goals Patient Stated Goal: to go home PT Goal Formulation: With patient/family Time For Goal Achievement: 12/15/22 Progress towards PT goals: Progressing toward goals    Frequency    Min 1X/week       AM-PAC PT "6 Clicks" Mobility   Outcome Measure  Help needed turning from your back to your side while in a flat bed without using bedrails?: None Help needed moving from lying on your  back to sitting on the side of a flat bed without using bedrails?: None Help needed moving to and from a bed to a chair (including a wheelchair)?: A Little Help needed standing up from a chair using your arms (e.g., wheelchair or bedside chair)?: A Little Help needed to walk in hospital room?: A Little Help needed climbing 3-5 steps with a railing? : A Little 6 Click Score: 20    End of Session   Activity Tolerance: Patient tolerated treatment well Patient left: in bed;with call bell/phone within  reach;with family/visitor present Nurse Communication: Mobility status PT Visit Diagnosis: Other abnormalities of gait and mobility (R26.89);Muscle weakness (generalized) (M62.81)     Time: 1610-9604 PT Time Calculation (min) (ACUTE ONLY): 15 min  Charges:    $Gait Training: 8-22 mins PT General Charges $$ ACUTE PT VISIT: 1 Visit                     Johny Shock, PTA Acute Rehabilitation Services Secure Chat Preferred  [phone]    Johny Shock 12/02/2022, 12:34 PM

## 2022-12-02 NOTE — Care Management Important Message (Signed)
Important Message  Patient Details  Name: [name] MRN: [id number] Date of Birth: 01/10/1946   Important Message Given:  Yes - Medicare IM  Patient left prior to IM delivery will mail copy to the patient home address.    [name] 12/02/2022, 3:04 PM

## 2022-12-02 NOTE — Care Management Important Message (Signed)
Important Message  Patient Details  Name: DYMOND EYE MRN: [id number] Date of Birth: 1945/08/18   Important Message Given:  Yes - Medicare IM Patient left prior to IM delivery will mail a copy to the patient home address.     Grason Brailsford 12/02/2022, 3:08 PM

## 2022-12-02 NOTE — TOC Initial Note (Signed)
Transition of Care (TOC) - Initial/Assessment Note   Spoke to patient and wife at bedside. MD ordered home health PT/OT/RN . HHRN for teaching new medications. Confirmed face sheet information. Kandee Keen with [facility] accepted referral.  Patient Details  Name: [name] MRN: [id number] Date of Birth: 1945/08/19  Transition of Care [facility]) CM/SW Contact:    Kingsley Plan, RN Phone Number: 12/02/2022, 11:31 AM  Clinical Narrative:                   Expected Discharge Plan: Home w Home Health Services Barriers to Discharge: No Barriers Identified   Patient Goals and CMS Choice Patient states their goals for this hospitalization and ongoing recovery are:: to return to home CMS Medicare.gov Compare Post Acute Care list provided to:: Patient Choice offered to / list presented to : Patient [place] ownership interest in [facility].provided to:: Patient    Expected Discharge Plan and Services   Discharge Planning Services: CM Consult Post Acute Care Choice: Home Health Living arrangements for the past 2 months: Single Family Home Expected Discharge Date: 12/02/22               DME Arranged: N/A         HH Arranged: RN, PT, OT HH Agency: [facility] Date [facility] Agency Contacted: 12/02/22 Time HH Agency Contacted: [id number] Representative spoke with at [facility] Agency: Kandee Keen  Prior Living Arrangements/Services Living arrangements for the past 2 months: Single Family Home Lives with:: Spouse Patient language and need for interpreter reviewed:: Yes Do you feel safe going back to the place where you live?: Yes      Need for Family Participation in Patient Care: Yes (Comment) Care giver support system in place?: Yes (comment) Current home services: DME Criminal Activity/Legal Involvement Pertinent to Current Situation/Hospitalization: No - Comment as needed  Activities of Daily Living Home Assistive Devices/Equipment: Cane (specify quad or straight), Eyeglasses,  Hand-held shower hose, Hearing aid, Long-handled shoehorn, Long-handled sponge, Raised toilet seat with rails, Scales, Shower chair without back, Walker (specify type) (straight cane, raised toilet seat no rails) ADL Screening (condition at time of admission) Is the patient deaf or have difficulty hearing?: No Does the patient have difficulty seeing, even when wearing glasses/contacts?: No Does the patient have difficulty concentrating, remembering, or making decisions?: Yes  Permission Sought/Granted   Permission granted to share information with : Yes, Verbal Permission Granted  Share Information with NAME: wife Rene Kocher  Permission granted to share info w AGENCY: [facility]        Emotional Assessment Appearance:: Appears stated age Attitude/Demeanor/Rapport: Engaged Affect (typically observed): Accepting Orientation: : Oriented to Self, Oriented to Place, Oriented to  Time, Oriented to Situation Alcohol / Substance Use: Not Applicable Psych Involvement: No (comment)  Admission diagnosis:  Orthostatic hypotension [I95.1] Syncope [R55] Bad headache [R51.9] Near syncope [R55] Patient Active Problem List   Diagnosis Date Noted   Syncope 11/29/2022   Pre-syncope 11/28/2022   Elevated troponin 11/28/2022   CKD stage 3a, GFR 45-59 ml/min (HCC) 11/28/2022   CAD S/P percutaneous coronary angioplasty 11/28/2022   Coronary artery disease of bypass graft of native heart with stable angina pectoris (HCC) 11/25/2022   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   Status post coronary artery stent placement    Post PTCA 12/24/2021   Hx of CABG    Syncope and collapse 04/09/2021   Loop Biotronik Biomonitor III for syncope and collapse 04/09/2021 04/09/2021   S/P lumbar laminectomy  07/13/2019   Chest pain 03/16/2016   Constipation 03/16/2016   HTN (hypertension)    Hyperlipidemia    Coronary artery disease of native artery of native heart with stable angina pectoris (HCC)    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 [place] [address] Phone: [phone] Fax: [phone]  [id number] - [place], [place] - [address] [place] [address] Phone: [phone] Fax: [phone]  [facility] - [address] [place], [place] - [address] [place] [address] Phone: [phone] Fax: [phone]     Social Determinants of Health (SDOH) Social History: SDOH Screenings   Food Insecurity: No Food Insecurity (11/28/2022)  Housing: Medium Risk (11/28/2022)  Transportation Needs: No Transportation Needs (11/28/2022)  Utilities: Not At Risk (11/28/2022)  Depression (PHQ2-9): Low Risk  (05/02/2022)  Recent Concern: Depression (PHQ2-9) - Medium Risk (03/06/2022)  Tobacco Use: Low Risk  (11/27/2022)   SDOH Interventions:     Readmission Risk Interventions     No data to display

## 2022-12-02 NOTE — Discharge Summary (Addendum)
Talmage Coin, MD Follow up in 1 week(s).   Specialty: Endocrinology Why: call to make appointment Wife will make appointment. Contact information: [address] [place] [address] [phone]         Tisovec, Adelfa Koh, MD Follow up in 1 week(s).   Specialty: Internal Medicine Contact information: [address] [place] [address] [phone]         Care, [facility] Follow up.   Specialty: Home Health Services Contact information: [address] [place] [address] [phone]                Discharge Exam: Ceasar Mons Weights   11/30/22 0245 12/01/22 0516 12/02/22 0605  Weight: 80.7 kg 80.6 kg 80.2 kg  General; NAD Condition at discharge: stable  The results of significant diagnostics from this hospitalization (including imaging, microbiology, ancillary and laboratory) are listed below for reference.   Imaging Studies: ECHOCARDIOGRAM COMPLETE  Result Date: 11/30/2022    ECHOCARDIOGRAM REPORT    Patient Name:   [name] Date of Exam: 11/30/2022 Medical Rec #:  [id number]       Height:       67.0 in Accession #:    [id number]      Weight:       177.9 lb Date of Birth:  June 22, 1945       BSA:          1.924 m Patient Age:    77 years        BP:           144/77 mmHg Patient Gender: M               HR:           57 bpm. Exam Location:  Inpatient Procedure: 2D Echo, Cardiac Doppler and Color Doppler Indications:    CAD of native vessel  History:        Patient has no prior history of Echocardiogram examinations.                 Prior CABG, chronic kidney disease; Risk Factors:Hypertension                 and Dyslipidemia.  Sonographer:    Delcie Roch RDCS Referring Phys: [id number] IMPRESSIONS  1. Left ventricular ejection fraction, by estimation, is 60 to 65%. The left ventricle has normal function. The left ventricle has no regional wall motion abnormalities. There is mild concentric left ventricular hypertrophy. Left ventricular diastolic parameters are consistent with Grade I diastolic dysfunction (impaired relaxation).  2. Right ventricular systolic function is normal. The right ventricular size is normal. There is normal pulmonary artery systolic pressure. The estimated right ventricular systolic pressure is 25.8 mmHg.  3. Left atrial size was mildly dilated.  4. The mitral valve is grossly normal. Trivial mitral valve regurgitation.  5. The aortic valve is functionally bicuspid. There is moderate to severe calcification of the aortic valve. Aortic valve regurgitation is not visualized. Mild aortic valve stenosis. Aortic valve area, by VTI measures 1.49 cm. Aortic valve mean gradient measures 11.0 mmHg. Aortic valve Vmax measures 2.27 m/s. Dimentionless index 0.43.  6. The inferior vena cava is normal in size with greater than 50% respiratory variability, suggesting right atrial pressure of 3 mmHg. Comparison(s): No prior Echocardiogram. FINDINGS  Left Ventricle: Left ventricular ejection  fraction, by estimation, is 60 to 65%. The left ventricle has normal function. The left ventricle has no  Physician Discharge Summary   Patient: [name] MRN: [id number] DOB: [birth year]  Admit date:     11/27/2022  Discharge date: 12/02/22  Discharge Physician: Alba Cory   PCP: Gaspar Garbe, MD   Recommendations at discharge:    Needs referral to Endocrinologist for further evaluation of adrenal insufficiency.  Need Bmet to monitor sodium level.  Needs follow up with cardiology for care of HF  Discharge Diagnoses: Principal Problem:   Pre-syncope Active Problems:   HTN (hypertension)   Hx of CABG   Elevated troponin   CKD stage 3a, GFR 45-59 ml/min (HCC)   CAD S/P percutaneous coronary angioplasty   Syncope  Resolved Problems:   * No resolved hospital problems. *  Hospital Course: 77 year old with past medical history significant for hypertension, CAD status post CABG, pulmonary hypertension, GERD, CKD 3A, hyperlipidemia presents complaining of headache and dizziness.  Of note patient underwent went left heart catheterization 11/25/2022 for recurrent angina and underwent balloon angioplasty at that time with some 150 to 200 mL of blood loss.  He presented to the hospital with some confusion, slurred speech, chest tightness and tunnel vision with headache.  He was also noted to be bradycardic.  Orthostatic vitals were positive in the ED.  Hemoglobin 8.1 from previous 9-10.  Rectal exam deferred FOBT.  Hyponatremia with a sodium level of 128.      Assessment and Plan: 1-Pre-Syncope, Orthostatic hypotension. Secondary to Adrenal Insufficiency.  -Plan to Hold BP medications: losartan, Maxzide.  -Discussed with Dr Jacinto Halim, hold BP medications, and do PRN midodrine.  -he received IV fluids.  -TSH normal. , cortisol low at 5--ACTH test consistent with adrenal insufficiency. Cortisol less than 14.  -Started Hydrocortisone. Orthostatic vitals improved. He is now asymptomatic. Could also do PRN midodrine. Will arrange [name] for PT and nurse to assist with new meds.    Acute  on chronic Hyponatremia History of Hyponatremia sodium 130---134.  2 weeks ago sodium was normal.  Urine osmolality: 269 , urine sodium: 58 Hold IV fluids.  Discontinue: Triamterene hydrochlorothiazide.  TSH: 1.9 , Cortisol low.  Suspect related to Adrenal insufficiency.  Started  Hydrocortisone. Sodium increase to 131.  Will continue with water restriction    CAD S/P percutaneous coronary angioplasty Elevated troponin -Status post remote CABG -Left heart cath 9/17 for stent stenosis of Lcx  status post balloon angioplasty -Continue with aspirin and Plavix -Continue with metoprolol.    CKD stage IIIa: -stable,. Cr baseline 1.3   Anemia; could be iron defficiecny, needs further work up out patient. Hb has been stable.    Hypertension, currently with orthostatic hypotension -Cardiology recommended Midodrine. PRN -hold meds.      Headaches, Dizziness;  Complaints of headaches, dizziness. Dizziness could be explain by orthistatic hypotension.  MRI negative for stroke.  Headaches could be related to sinusitis. Started flonase.    Estimated body mass index is 27.83 kg/m as calculated from the following:   Height as of this encounter: 5\' 7"  (1.702 m).   Weight as of this encounter: 80.6 kg.           Consultants: Cardiology  Procedures performed: ECHO Disposition: Home Diet recommendation:  Discharge Diet Orders (From admission, onward)     Start     Ordered   12/02/22 0000  Diet - low sodium heart healthy        12/02/22 0847           Cardiac diet DISCHARGE MEDICATION: Allergies as of 12/02/2022  Physician Discharge Summary   Patient: [name] MRN: [id number] DOB: [birth year]  Admit date:     11/27/2022  Discharge date: 12/02/22  Discharge Physician: Alba Cory   PCP: Gaspar Garbe, MD   Recommendations at discharge:    Needs referral to Endocrinologist for further evaluation of adrenal insufficiency.  Need Bmet to monitor sodium level.  Needs follow up with cardiology for care of HF  Discharge Diagnoses: Principal Problem:   Pre-syncope Active Problems:   HTN (hypertension)   Hx of CABG   Elevated troponin   CKD stage 3a, GFR 45-59 ml/min (HCC)   CAD S/P percutaneous coronary angioplasty   Syncope  Resolved Problems:   * No resolved hospital problems. *  Hospital Course: 77 year old with past medical history significant for hypertension, CAD status post CABG, pulmonary hypertension, GERD, CKD 3A, hyperlipidemia presents complaining of headache and dizziness.  Of note patient underwent went left heart catheterization 11/25/2022 for recurrent angina and underwent balloon angioplasty at that time with some 150 to 200 mL of blood loss.  He presented to the hospital with some confusion, slurred speech, chest tightness and tunnel vision with headache.  He was also noted to be bradycardic.  Orthostatic vitals were positive in the ED.  Hemoglobin 8.1 from previous 9-10.  Rectal exam deferred FOBT.  Hyponatremia with a sodium level of 128.      Assessment and Plan: 1-Pre-Syncope, Orthostatic hypotension. Secondary to Adrenal Insufficiency.  -Plan to Hold BP medications: losartan, Maxzide.  -Discussed with Dr Jacinto Halim, hold BP medications, and do PRN midodrine.  -he received IV fluids.  -TSH normal. , cortisol low at 5--ACTH test consistent with adrenal insufficiency. Cortisol less than 14.  -Started Hydrocortisone. Orthostatic vitals improved. He is now asymptomatic. Could also do PRN midodrine. Will arrange [name] for PT and nurse to assist with new meds.    Acute  on chronic Hyponatremia History of Hyponatremia sodium 130---134.  2 weeks ago sodium was normal.  Urine osmolality: 269 , urine sodium: 58 Hold IV fluids.  Discontinue: Triamterene hydrochlorothiazide.  TSH: 1.9 , Cortisol low.  Suspect related to Adrenal insufficiency.  Started  Hydrocortisone. Sodium increase to 131.  Will continue with water restriction    CAD S/P percutaneous coronary angioplasty Elevated troponin -Status post remote CABG -Left heart cath 9/17 for stent stenosis of Lcx  status post balloon angioplasty -Continue with aspirin and Plavix -Continue with metoprolol.    CKD stage IIIa: -stable,. Cr baseline 1.3   Anemia; could be iron defficiecny, needs further work up out patient. Hb has been stable.    Hypertension, currently with orthostatic hypotension -Cardiology recommended Midodrine. PRN -hold meds.      Headaches, Dizziness;  Complaints of headaches, dizziness. Dizziness could be explain by orthistatic hypotension.  MRI negative for stroke.  Headaches could be related to sinusitis. Started flonase.    Estimated body mass index is 27.83 kg/m as calculated from the following:   Height as of this encounter: 5\' 7"  (1.702 m).   Weight as of this encounter: 80.6 kg.           Consultants: Cardiology  Procedures performed: ECHO Disposition: Home Diet recommendation:  Discharge Diet Orders (From admission, onward)     Start     Ordered   12/02/22 0000  Diet - low sodium heart healthy        12/02/22 0847           Cardiac diet DISCHARGE MEDICATION: Allergies as of 12/02/2022  Talmage Coin, MD Follow up in 1 week(s).   Specialty: Endocrinology Why: call to make appointment Wife will make appointment. Contact information: [address] [place] [address] [phone]         Tisovec, Adelfa Koh, MD Follow up in 1 week(s).   Specialty: Internal Medicine Contact information: [address] [place] [address] [phone]         Care, [facility] Follow up.   Specialty: Home Health Services Contact information: [address] [place] [address] [phone]                Discharge Exam: Ceasar Mons Weights   11/30/22 0245 12/01/22 0516 12/02/22 0605  Weight: 80.7 kg 80.6 kg 80.2 kg  General; NAD Condition at discharge: stable  The results of significant diagnostics from this hospitalization (including imaging, microbiology, ancillary and laboratory) are listed below for reference.   Imaging Studies: ECHOCARDIOGRAM COMPLETE  Result Date: 11/30/2022    ECHOCARDIOGRAM REPORT    Patient Name:   [name] Date of Exam: 11/30/2022 Medical Rec #:  [id number]       Height:       67.0 in Accession #:    [id number]      Weight:       177.9 lb Date of Birth:  June 22, 1945       BSA:          1.924 m Patient Age:    77 years        BP:           144/77 mmHg Patient Gender: M               HR:           57 bpm. Exam Location:  Inpatient Procedure: 2D Echo, Cardiac Doppler and Color Doppler Indications:    CAD of native vessel  History:        Patient has no prior history of Echocardiogram examinations.                 Prior CABG, chronic kidney disease; Risk Factors:Hypertension                 and Dyslipidemia.  Sonographer:    Delcie Roch RDCS Referring Phys: [id number] IMPRESSIONS  1. Left ventricular ejection fraction, by estimation, is 60 to 65%. The left ventricle has normal function. The left ventricle has no regional wall motion abnormalities. There is mild concentric left ventricular hypertrophy. Left ventricular diastolic parameters are consistent with Grade I diastolic dysfunction (impaired relaxation).  2. Right ventricular systolic function is normal. The right ventricular size is normal. There is normal pulmonary artery systolic pressure. The estimated right ventricular systolic pressure is 25.8 mmHg.  3. Left atrial size was mildly dilated.  4. The mitral valve is grossly normal. Trivial mitral valve regurgitation.  5. The aortic valve is functionally bicuspid. There is moderate to severe calcification of the aortic valve. Aortic valve regurgitation is not visualized. Mild aortic valve stenosis. Aortic valve area, by VTI measures 1.49 cm. Aortic valve mean gradient measures 11.0 mmHg. Aortic valve Vmax measures 2.27 m/s. Dimentionless index 0.43.  6. The inferior vena cava is normal in size with greater than 50% respiratory variability, suggesting right atrial pressure of 3 mmHg. Comparison(s): No prior Echocardiogram. FINDINGS  Left Ventricle: Left ventricular ejection  fraction, by estimation, is 60 to 65%. The left ventricle has normal function. The left ventricle has no  Talmage Coin, MD Follow up in 1 week(s).   Specialty: Endocrinology Why: call to make appointment Wife will make appointment. Contact information: [address] [place] [address] [phone]         Tisovec, Adelfa Koh, MD Follow up in 1 week(s).   Specialty: Internal Medicine Contact information: [address] [place] [address] [phone]         Care, [facility] Follow up.   Specialty: Home Health Services Contact information: [address] [place] [address] [phone]                Discharge Exam: Ceasar Mons Weights   11/30/22 0245 12/01/22 0516 12/02/22 0605  Weight: 80.7 kg 80.6 kg 80.2 kg  General; NAD Condition at discharge: stable  The results of significant diagnostics from this hospitalization (including imaging, microbiology, ancillary and laboratory) are listed below for reference.   Imaging Studies: ECHOCARDIOGRAM COMPLETE  Result Date: 11/30/2022    ECHOCARDIOGRAM REPORT    Patient Name:   [name] Date of Exam: 11/30/2022 Medical Rec #:  [id number]       Height:       67.0 in Accession #:    [id number]      Weight:       177.9 lb Date of Birth:  June 22, 1945       BSA:          1.924 m Patient Age:    77 years        BP:           144/77 mmHg Patient Gender: M               HR:           57 bpm. Exam Location:  Inpatient Procedure: 2D Echo, Cardiac Doppler and Color Doppler Indications:    CAD of native vessel  History:        Patient has no prior history of Echocardiogram examinations.                 Prior CABG, chronic kidney disease; Risk Factors:Hypertension                 and Dyslipidemia.  Sonographer:    Delcie Roch RDCS Referring Phys: [id number] IMPRESSIONS  1. Left ventricular ejection fraction, by estimation, is 60 to 65%. The left ventricle has normal function. The left ventricle has no regional wall motion abnormalities. There is mild concentric left ventricular hypertrophy. Left ventricular diastolic parameters are consistent with Grade I diastolic dysfunction (impaired relaxation).  2. Right ventricular systolic function is normal. The right ventricular size is normal. There is normal pulmonary artery systolic pressure. The estimated right ventricular systolic pressure is 25.8 mmHg.  3. Left atrial size was mildly dilated.  4. The mitral valve is grossly normal. Trivial mitral valve regurgitation.  5. The aortic valve is functionally bicuspid. There is moderate to severe calcification of the aortic valve. Aortic valve regurgitation is not visualized. Mild aortic valve stenosis. Aortic valve area, by VTI measures 1.49 cm. Aortic valve mean gradient measures 11.0 mmHg. Aortic valve Vmax measures 2.27 m/s. Dimentionless index 0.43.  6. The inferior vena cava is normal in size with greater than 50% respiratory variability, suggesting right atrial pressure of 3 mmHg. Comparison(s): No prior Echocardiogram. FINDINGS  Left Ventricle: Left ventricular ejection  fraction, by estimation, is 60 to 65%. The left ventricle has normal function. The left ventricle has no  Talmage Coin, MD Follow up in 1 week(s).   Specialty: Endocrinology Why: call to make appointment Wife will make appointment. Contact information: [address] [place] [address] [phone]         Tisovec, Adelfa Koh, MD Follow up in 1 week(s).   Specialty: Internal Medicine Contact information: [address] [place] [address] [phone]         Care, [facility] Follow up.   Specialty: Home Health Services Contact information: [address] [place] [address] [phone]                Discharge Exam: Ceasar Mons Weights   11/30/22 0245 12/01/22 0516 12/02/22 0605  Weight: 80.7 kg 80.6 kg 80.2 kg  General; NAD Condition at discharge: stable  The results of significant diagnostics from this hospitalization (including imaging, microbiology, ancillary and laboratory) are listed below for reference.   Imaging Studies: ECHOCARDIOGRAM COMPLETE  Result Date: 11/30/2022    ECHOCARDIOGRAM REPORT    Patient Name:   [name] Date of Exam: 11/30/2022 Medical Rec #:  [id number]       Height:       67.0 in Accession #:    [id number]      Weight:       177.9 lb Date of Birth:  June 22, 1945       BSA:          1.924 m Patient Age:    77 years        BP:           144/77 mmHg Patient Gender: M               HR:           57 bpm. Exam Location:  Inpatient Procedure: 2D Echo, Cardiac Doppler and Color Doppler Indications:    CAD of native vessel  History:        Patient has no prior history of Echocardiogram examinations.                 Prior CABG, chronic kidney disease; Risk Factors:Hypertension                 and Dyslipidemia.  Sonographer:    Delcie Roch RDCS Referring Phys: [id number] IMPRESSIONS  1. Left ventricular ejection fraction, by estimation, is 60 to 65%. The left ventricle has normal function. The left ventricle has no regional wall motion abnormalities. There is mild concentric left ventricular hypertrophy. Left ventricular diastolic parameters are consistent with Grade I diastolic dysfunction (impaired relaxation).  2. Right ventricular systolic function is normal. The right ventricular size is normal. There is normal pulmonary artery systolic pressure. The estimated right ventricular systolic pressure is 25.8 mmHg.  3. Left atrial size was mildly dilated.  4. The mitral valve is grossly normal. Trivial mitral valve regurgitation.  5. The aortic valve is functionally bicuspid. There is moderate to severe calcification of the aortic valve. Aortic valve regurgitation is not visualized. Mild aortic valve stenosis. Aortic valve area, by VTI measures 1.49 cm. Aortic valve mean gradient measures 11.0 mmHg. Aortic valve Vmax measures 2.27 m/s. Dimentionless index 0.43.  6. The inferior vena cava is normal in size with greater than 50% respiratory variability, suggesting right atrial pressure of 3 mmHg. Comparison(s): No prior Echocardiogram. FINDINGS  Left Ventricle: Left ventricular ejection  fraction, by estimation, is 60 to 65%. The left ventricle has normal function. The left ventricle has no  Talmage Coin, MD Follow up in 1 week(s).   Specialty: Endocrinology Why: call to make appointment Wife will make appointment. Contact information: [address] [place] [address] [phone]         Tisovec, Adelfa Koh, MD Follow up in 1 week(s).   Specialty: Internal Medicine Contact information: [address] [place] [address] [phone]         Care, [facility] Follow up.   Specialty: Home Health Services Contact information: [address] [place] [address] [phone]                Discharge Exam: Ceasar Mons Weights   11/30/22 0245 12/01/22 0516 12/02/22 0605  Weight: 80.7 kg 80.6 kg 80.2 kg  General; NAD Condition at discharge: stable  The results of significant diagnostics from this hospitalization (including imaging, microbiology, ancillary and laboratory) are listed below for reference.   Imaging Studies: ECHOCARDIOGRAM COMPLETE  Result Date: 11/30/2022    ECHOCARDIOGRAM REPORT    Patient Name:   [name] Date of Exam: 11/30/2022 Medical Rec #:  [id number]       Height:       67.0 in Accession #:    [id number]      Weight:       177.9 lb Date of Birth:  June 22, 1945       BSA:          1.924 m Patient Age:    77 years        BP:           144/77 mmHg Patient Gender: M               HR:           57 bpm. Exam Location:  Inpatient Procedure: 2D Echo, Cardiac Doppler and Color Doppler Indications:    CAD of native vessel  History:        Patient has no prior history of Echocardiogram examinations.                 Prior CABG, chronic kidney disease; Risk Factors:Hypertension                 and Dyslipidemia.  Sonographer:    Delcie Roch RDCS Referring Phys: [id number] IMPRESSIONS  1. Left ventricular ejection fraction, by estimation, is 60 to 65%. The left ventricle has normal function. The left ventricle has no regional wall motion abnormalities. There is mild concentric left ventricular hypertrophy. Left ventricular diastolic parameters are consistent with Grade I diastolic dysfunction (impaired relaxation).  2. Right ventricular systolic function is normal. The right ventricular size is normal. There is normal pulmonary artery systolic pressure. The estimated right ventricular systolic pressure is 25.8 mmHg.  3. Left atrial size was mildly dilated.  4. The mitral valve is grossly normal. Trivial mitral valve regurgitation.  5. The aortic valve is functionally bicuspid. There is moderate to severe calcification of the aortic valve. Aortic valve regurgitation is not visualized. Mild aortic valve stenosis. Aortic valve area, by VTI measures 1.49 cm. Aortic valve mean gradient measures 11.0 mmHg. Aortic valve Vmax measures 2.27 m/s. Dimentionless index 0.43.  6. The inferior vena cava is normal in size with greater than 50% respiratory variability, suggesting right atrial pressure of 3 mmHg. Comparison(s): No prior Echocardiogram. FINDINGS  Left Ventricle: Left ventricular ejection  fraction, by estimation, is 60 to 65%. The left ventricle has normal function. The left ventricle has no  Talmage Coin, MD Follow up in 1 week(s).   Specialty: Endocrinology Why: call to make appointment Wife will make appointment. Contact information: [address] [place] [address] [phone]         Tisovec, Adelfa Koh, MD Follow up in 1 week(s).   Specialty: Internal Medicine Contact information: [address] [place] [address] [phone]         Care, [facility] Follow up.   Specialty: Home Health Services Contact information: [address] [place] [address] [phone]                Discharge Exam: Ceasar Mons Weights   11/30/22 0245 12/01/22 0516 12/02/22 0605  Weight: 80.7 kg 80.6 kg 80.2 kg  General; NAD Condition at discharge: stable  The results of significant diagnostics from this hospitalization (including imaging, microbiology, ancillary and laboratory) are listed below for reference.   Imaging Studies: ECHOCARDIOGRAM COMPLETE  Result Date: 11/30/2022    ECHOCARDIOGRAM REPORT    Patient Name:   [name] Date of Exam: 11/30/2022 Medical Rec #:  [id number]       Height:       67.0 in Accession #:    [id number]      Weight:       177.9 lb Date of Birth:  June 22, 1945       BSA:          1.924 m Patient Age:    77 years        BP:           144/77 mmHg Patient Gender: M               HR:           57 bpm. Exam Location:  Inpatient Procedure: 2D Echo, Cardiac Doppler and Color Doppler Indications:    CAD of native vessel  History:        Patient has no prior history of Echocardiogram examinations.                 Prior CABG, chronic kidney disease; Risk Factors:Hypertension                 and Dyslipidemia.  Sonographer:    Delcie Roch RDCS Referring Phys: [id number] IMPRESSIONS  1. Left ventricular ejection fraction, by estimation, is 60 to 65%. The left ventricle has normal function. The left ventricle has no regional wall motion abnormalities. There is mild concentric left ventricular hypertrophy. Left ventricular diastolic parameters are consistent with Grade I diastolic dysfunction (impaired relaxation).  2. Right ventricular systolic function is normal. The right ventricular size is normal. There is normal pulmonary artery systolic pressure. The estimated right ventricular systolic pressure is 25.8 mmHg.  3. Left atrial size was mildly dilated.  4. The mitral valve is grossly normal. Trivial mitral valve regurgitation.  5. The aortic valve is functionally bicuspid. There is moderate to severe calcification of the aortic valve. Aortic valve regurgitation is not visualized. Mild aortic valve stenosis. Aortic valve area, by VTI measures 1.49 cm. Aortic valve mean gradient measures 11.0 mmHg. Aortic valve Vmax measures 2.27 m/s. Dimentionless index 0.43.  6. The inferior vena cava is normal in size with greater than 50% respiratory variability, suggesting right atrial pressure of 3 mmHg. Comparison(s): No prior Echocardiogram. FINDINGS  Left Ventricle: Left ventricular ejection  fraction, by estimation, is 60 to 65%. The left ventricle has normal function. The left ventricle has no

## 2022-12-02 NOTE — Progress Notes (Signed)
Mobility Specialist Progress Note:   12/02/22 1119  Mobility  Activity Ambulated with assistance in hallway  Level of Assistance Contact guard assist, steadying assist  Assistive Device Cane  Distance Ambulated (ft) 160 ft  Activity Response Tolerated well  Mobility Referral Yes  $Mobility charge 1 Mobility  Mobility Specialist Start Time (ACUTE ONLY) 1100  Mobility Specialist Stop Time (ACUTE ONLY) 1110  Mobility Specialist Time Calculation (min) (ACUTE ONLY) 10 min   Pre Mobility: 74 HR During Mobility: HR  Post Mobility: 72 HR ,110/72 BP, 92% SpO2  Pt received in bed, agreeable to mobility. SB to stand. CG during ambulation. Pt c/o headache during ambulation but denied any lightheadedness. Asymptomatic throughout. Pt returned to bed with call bell in hand and all needs met. Family present.  Leory Plowman  Mobility Specialist Please contact via [employer] office at [phone]

## 2022-12-07 DIAGNOSIS — N1831 Chronic kidney disease, stage 3a: Secondary | ICD-10-CM | POA: Diagnosis not present

## 2022-12-07 DIAGNOSIS — Z7982 Long term (current) use of aspirin: Secondary | ICD-10-CM | POA: Diagnosis not present

## 2022-12-07 DIAGNOSIS — E785 Hyperlipidemia, unspecified: Secondary | ICD-10-CM | POA: Diagnosis not present

## 2022-12-07 DIAGNOSIS — Z79899 Other long term (current) drug therapy: Secondary | ICD-10-CM | POA: Diagnosis not present

## 2022-12-07 DIAGNOSIS — Z95818 Presence of other cardiac implants and grafts: Secondary | ICD-10-CM | POA: Diagnosis not present

## 2022-12-07 DIAGNOSIS — I272 Pulmonary hypertension, unspecified: Secondary | ICD-10-CM | POA: Diagnosis not present

## 2022-12-07 DIAGNOSIS — K219 Gastro-esophageal reflux disease without esophagitis: Secondary | ICD-10-CM | POA: Diagnosis not present

## 2022-12-07 DIAGNOSIS — I25119 Atherosclerotic heart disease of native coronary artery with unspecified angina pectoris: Secondary | ICD-10-CM | POA: Diagnosis not present

## 2022-12-07 DIAGNOSIS — I444 Left anterior fascicular block: Secondary | ICD-10-CM | POA: Diagnosis not present

## 2022-12-07 DIAGNOSIS — D62 Acute posthemorrhagic anemia: Secondary | ICD-10-CM | POA: Diagnosis not present

## 2022-12-07 DIAGNOSIS — Z7902 Long term (current) use of antithrombotics/antiplatelets: Secondary | ICD-10-CM | POA: Diagnosis not present

## 2022-12-07 DIAGNOSIS — I088 Other rheumatic multiple valve diseases: Secondary | ICD-10-CM | POA: Diagnosis not present

## 2022-12-07 DIAGNOSIS — Z951 Presence of aortocoronary bypass graft: Secondary | ICD-10-CM | POA: Diagnosis not present

## 2022-12-07 DIAGNOSIS — Z48812 Encounter for surgical aftercare following surgery on the circulatory system: Secondary | ICD-10-CM | POA: Diagnosis not present

## 2022-12-07 DIAGNOSIS — I951 Orthostatic hypotension: Secondary | ICD-10-CM | POA: Diagnosis not present

## 2022-12-07 DIAGNOSIS — Z8701 Personal history of pneumonia (recurrent): Secondary | ICD-10-CM | POA: Diagnosis not present

## 2022-12-07 DIAGNOSIS — I1310 Hypertensive heart and chronic kidney disease without heart failure, with stage 1 through stage 4 chronic kidney disease, or unspecified chronic kidney disease: Secondary | ICD-10-CM | POA: Diagnosis not present

## 2022-12-07 DIAGNOSIS — Z7952 Long term (current) use of systemic steroids: Secondary | ICD-10-CM | POA: Diagnosis not present

## 2022-12-07 DIAGNOSIS — Z96642 Presence of left artificial hip joint: Secondary | ICD-10-CM | POA: Diagnosis not present

## 2022-12-07 DIAGNOSIS — K5730 Diverticulosis of large intestine without perforation or abscess without bleeding: Secondary | ICD-10-CM | POA: Diagnosis not present

## 2022-12-07 DIAGNOSIS — M1611 Unilateral primary osteoarthritis, right hip: Secondary | ICD-10-CM | POA: Diagnosis not present

## 2022-12-07 DIAGNOSIS — D631 Anemia in chronic kidney disease: Secondary | ICD-10-CM | POA: Diagnosis not present

## 2022-12-07 DIAGNOSIS — E2740 Unspecified adrenocortical insufficiency: Secondary | ICD-10-CM | POA: Diagnosis not present

## 2022-12-07 DIAGNOSIS — Z955 Presence of coronary angioplasty implant and graft: Secondary | ICD-10-CM | POA: Diagnosis not present

## 2022-12-09 ENCOUNTER — Ambulatory Visit: Payer: PPO | Attending: Internal Medicine

## 2022-12-09 DIAGNOSIS — E1129 Type 2 diabetes mellitus with other diabetic kidney complication: Secondary | ICD-10-CM | POA: Diagnosis not present

## 2022-12-09 DIAGNOSIS — N183 Chronic kidney disease, stage 3 unspecified: Secondary | ICD-10-CM | POA: Diagnosis not present

## 2022-12-09 DIAGNOSIS — Z23 Encounter for immunization: Secondary | ICD-10-CM | POA: Diagnosis not present

## 2022-12-09 DIAGNOSIS — R55 Syncope and collapse: Secondary | ICD-10-CM | POA: Diagnosis not present

## 2022-12-09 DIAGNOSIS — D692 Other nonthrombocytopenic purpura: Secondary | ICD-10-CM | POA: Diagnosis not present

## 2022-12-09 DIAGNOSIS — Z0000 Encounter for general adult medical examination without abnormal findings: Secondary | ICD-10-CM | POA: Diagnosis not present

## 2022-12-09 DIAGNOSIS — E2740 Unspecified adrenocortical insufficiency: Secondary | ICD-10-CM | POA: Diagnosis not present

## 2022-12-09 DIAGNOSIS — E7800 Pure hypercholesterolemia, unspecified: Secondary | ICD-10-CM | POA: Diagnosis not present

## 2022-12-09 DIAGNOSIS — D649 Anemia, unspecified: Secondary | ICD-10-CM | POA: Diagnosis not present

## 2022-12-09 DIAGNOSIS — Z1389 Encounter for screening for other disorder: Secondary | ICD-10-CM | POA: Diagnosis not present

## 2022-12-09 DIAGNOSIS — Z9861 Coronary angioplasty status: Secondary | ICD-10-CM | POA: Diagnosis not present

## 2022-12-09 DIAGNOSIS — I272 Pulmonary hypertension, unspecified: Secondary | ICD-10-CM | POA: Diagnosis not present

## 2022-12-09 DIAGNOSIS — R7302 Impaired glucose tolerance (oral): Secondary | ICD-10-CM | POA: Diagnosis not present

## 2022-12-09 DIAGNOSIS — I1310 Hypertensive heart and chronic kidney disease without heart failure, with stage 1 through stage 4 chronic kidney disease, or unspecified chronic kidney disease: Secondary | ICD-10-CM | POA: Diagnosis not present

## 2022-12-09 DIAGNOSIS — N1831 Chronic kidney disease, stage 3a: Secondary | ICD-10-CM | POA: Diagnosis not present

## 2022-12-09 LAB — CUP PACEART REMOTE DEVICE CHECK
Date Time Interrogation Session: 20241001095837
Implantable Pulse Generator Implant Date: 20230131
Pulse Gen Model: 436066
Pulse Gen Serial Number: 94078760

## 2022-12-10 NOTE — Progress Notes (Signed)
Consider patient for [employer]-Lp(a) clinical trial.

## 2022-12-15 DIAGNOSIS — N1831 Chronic kidney disease, stage 3a: Secondary | ICD-10-CM | POA: Diagnosis not present

## 2022-12-15 DIAGNOSIS — E1129 Type 2 diabetes mellitus with other diabetic kidney complication: Secondary | ICD-10-CM | POA: Diagnosis not present

## 2022-12-15 DIAGNOSIS — I088 Other rheumatic multiple valve diseases: Secondary | ICD-10-CM | POA: Diagnosis not present

## 2022-12-15 DIAGNOSIS — Z48812 Encounter for surgical aftercare following surgery on the circulatory system: Secondary | ICD-10-CM | POA: Diagnosis not present

## 2022-12-15 DIAGNOSIS — D631 Anemia in chronic kidney disease: Secondary | ICD-10-CM | POA: Diagnosis not present

## 2022-12-15 DIAGNOSIS — I951 Orthostatic hypotension: Secondary | ICD-10-CM | POA: Diagnosis not present

## 2022-12-15 DIAGNOSIS — I444 Left anterior fascicular block: Secondary | ICD-10-CM | POA: Diagnosis not present

## 2022-12-15 DIAGNOSIS — I25119 Atherosclerotic heart disease of native coronary artery with unspecified angina pectoris: Secondary | ICD-10-CM | POA: Diagnosis not present

## 2022-12-15 DIAGNOSIS — D62 Acute posthemorrhagic anemia: Secondary | ICD-10-CM | POA: Diagnosis not present

## 2022-12-15 DIAGNOSIS — E2740 Unspecified adrenocortical insufficiency: Secondary | ICD-10-CM | POA: Diagnosis not present

## 2022-12-15 DIAGNOSIS — E871 Hypo-osmolality and hyponatremia: Secondary | ICD-10-CM | POA: Diagnosis not present

## 2022-12-15 DIAGNOSIS — I272 Pulmonary hypertension, unspecified: Secondary | ICD-10-CM | POA: Diagnosis not present

## 2022-12-15 DIAGNOSIS — I700 Atherosclerosis of aorta: Secondary | ICD-10-CM | POA: Diagnosis not present

## 2022-12-15 DIAGNOSIS — E785 Hyperlipidemia, unspecified: Secondary | ICD-10-CM | POA: Diagnosis not present

## 2022-12-15 DIAGNOSIS — G319 Degenerative disease of nervous system, unspecified: Secondary | ICD-10-CM | POA: Diagnosis not present

## 2022-12-15 DIAGNOSIS — I1310 Hypertensive heart and chronic kidney disease without heart failure, with stage 1 through stage 4 chronic kidney disease, or unspecified chronic kidney disease: Secondary | ICD-10-CM | POA: Diagnosis not present

## 2022-12-17 ENCOUNTER — Encounter: Payer: Self-pay | Admitting: Cardiology

## 2022-12-17 ENCOUNTER — Ambulatory Visit: Payer: PPO | Attending: Cardiology | Admitting: Cardiology

## 2022-12-17 VITALS — BP 130/72 | HR 60 | Resp 16 | Ht 67.0 in | Wt 188.2 lb

## 2022-12-17 DIAGNOSIS — D500 Iron deficiency anemia secondary to blood loss (chronic): Secondary | ICD-10-CM

## 2022-12-17 DIAGNOSIS — I25118 Atherosclerotic heart disease of native coronary artery with other forms of angina pectoris: Secondary | ICD-10-CM

## 2022-12-17 DIAGNOSIS — I951 Orthostatic hypotension: Secondary | ICD-10-CM | POA: Diagnosis not present

## 2022-12-17 NOTE — Patient Instructions (Signed)
Medication Instructions:  Your physician recommends that you continue on your current medications as directed. Please refer to the Current Medication list given to you today.  *If you need a refill on your cardiac medications before your next appointment, please call your pharmacy*   Lab Work: none If you have labs (blood work) drawn today and your tests are completely normal, you will receive your results only by: MyChart Message (if you have MyChart) OR A paper copy in the mail If you have any lab test that is abnormal or we need to change your treatment, we will call you to review the results.   Testing/Procedures: none   Follow-Up: At [facility], you and your health needs are our priority.  As part of our continuing mission to provide you with exceptional heart care, we have created designated Provider Care Teams.  These Care Teams include your primary Cardiologist (physician) and Advanced Practice Providers (APPs -  Physician Assistants and Nurse Practitioners) who all work together to provide you with the care you need, when you need it.  We recommend signing up for the patient portal called "MyChart".  Sign up information is provided on this After Visit Summary.  MyChart is used to connect with patients for Virtual Visits (Telemedicine).  Patients are able to view lab/test results, encounter notes, upcoming appointments, etc.  Non-urgent messages can be sent to your provider as well.   To learn more about what you can do with MyChart, go to [email or web address].    Your next appointment:   6 month(s)  Provider:   Yates Decamp, MD     Other Instructions

## 2022-12-17 NOTE — Progress Notes (Signed)
Cardiology Office Note:  .   Date:  12/17/2022  ID:  [name], DOB 06-Apr-1945, MRN [id number] PCP: Gaspar Garbe, MD  [facility] Providers Cardiologist:  Yates Decamp, MD    History of Present Illness: .   [name] is a 77 y.o. Caucasian male patient with coronary artery SP CABG in 1996, chronic stable angina, angioplasty to distal LM and ostial and proximal CX on 12/24/2021, has a patent LIMA to LAD and free radial graft with the ostial stent to D1 and OM-1. PMH significant for mild aortic stenosis, hypertension, mixed hyperlipidemia, GERD, chronic back pain, recurrent syncope and dizziness, underwent loop recorder implantation on 04/09/2021.  Due to suspicion for in-stent restenosis in the circumflex coronary artery underwent repeat cardiac catheterization on 11/25/2022 and underwent Cutting Balloon angioplasty.  Patient was also admitted on 11/27/2022 and discharged on 12/02/2022 with severe orthostatic hypotension, his ARB, diuretics were discontinued.  He is now being evaluated for orthostatic hypotension and adrenal insufficiency as well.  He now presents for follow-up.  He has noticed improvement in his symptoms of angina and dyspnea but states that he is not at his baseline still.  Discussed the use of AI scribe software for clinical note transcription with the patient, who gave verbal consent to proceed.  Review of Systems  Cardiovascular:  Positive for chest pain and dyspnea on exertion. Negative for leg swelling.    Risk Assessment/Calculations:     Lab Results  Component Value Date   CHOL 84 03/16/2016   HDL 32 (L) 03/16/2016   LDLCALC 39 03/16/2016   TRIG 67 03/16/2016   CHOLHDL 2.6 03/16/2016   Lab Results  Component Value Date   NA 131 (L) 12/02/2022   K 3.9 12/02/2022   CO2 23 12/02/2022   GLUCOSE 129 (H) 12/02/2022   BUN 15 12/02/2022   CREATININE 1.21 12/02/2022   CALCIUM 9.1 12/02/2022   EGFR 60 11/13/2022   GFRNONAA >60 12/02/2022    Lab Results  Component Value Date   WBC 5.5 12/02/2022   HGB 8.4 (L) 12/02/2022   HCT 26.6 (L) 12/02/2022   MCV 83.9 12/02/2022   PLT 228 12/02/2022      Latest Ref Rng & Units 12/02/2022    2:11 AM 12/01/2022    6:03 AM 11/30/2022    2:29 AM  CBC  WBC 4.0 - 10.5 K/uL 5.5  6.8  6.5   Hemoglobin 13.0 - 17.0 g/dL 8.4  8.6  8.4   Hematocrit 39.0 - 52.0 % 26.6  26.0  25.1   Platelets 150 - 400 K/uL 228  234  211     Physical Exam:   VS:  BP 130/72 (BP Location: Right Arm, Patient Position: Sitting, Cuff Size: Large)   Pulse 60   Resp 16   Ht 5\' 7"  (1.702 m)   Wt 188 lb 3.2 oz (85.4 kg)   SpO2 96%   BMI 29.48 kg/m    Wt Readings from Last 3 Encounters:  12/17/22 188 lb 3.2 oz (85.4 kg)  12/02/22 176 lb 14.4 oz (80.2 kg)  11/27/22 179 lb 14.3 oz (81.6 kg)     Physical Exam Neck:     Vascular: No carotid bruit or JVD.  Cardiovascular:     Rate and Rhythm: Normal rate and regular rhythm.     Pulses: Intact distal pulses.     Heart sounds: Murmur heard.     Midsystolic murmur is present with a grade of 2/6 at  the upper right sternal border.     No gallop.  Pulmonary:     Effort: Pulmonary effort is normal.     Breath sounds: Normal breath sounds.  Abdominal:     General: Bowel sounds are normal.     Palpations: Abdomen is soft.  Musculoskeletal:     Right lower leg: No edema.     Left lower leg: No edema.  Skin:    Capillary Refill: Capillary refill takes less than 2 seconds.     Findings: Bruising present.     Studies Reviewed: Marland Kitchen    EKG:        EKG 11/27/2022: Normal sinus rhythm at the rate of 60 bpm, left anterior fascicular block.  IVCD, borderline criteria for LVH.  Echocardiogram 11/30/2022: 1. Left ventricular ejection fraction, by estimation, is 60 to 65%. The left ventricle has normal function. The left ventricle has no regional wall motion abnormalities. There is mild concentric left ventricular hypertrophy. Left ventricular diastolic  parameters  are consistent with Grade I diastolic dysfunction (impaired relaxation).  2. Right ventricular systolic function is normal. The right ventricular size is normal. There is normal pulmonary artery systolic pressure. The estimated right ventricular systolic pressure is 25.8 mmHg.  3. Left atrial size was mildly dilated.  4. The mitral valve is grossly normal. Trivial mitral valve regurgitation.  5. The aortic valve is functionally bicuspid. There is moderate to severe calcification of the aortic valve. Aortic valve regurgitation is not visualized. Mild aortic valve stenosis. Aortic valve area, by VTI measures 1.49 cm. Aortic valve mean  gradient measures 11.0 mmHg. Aortic valve Vmax measures 2.27 m/s. Dimentionless index 0.43.  6. The inferior vena cava is normal in size with greater than 50% respiratory variability, suggesting right atrial pressure of 3 mmHg.  Left Heart Catheterization 11/25/22:  Hemodynamic data: LV: 189/5, EDP 20 mmHg.  Ao 187/61, mean 114 mmHg.  No pressure gradient across the aortic valve.    RCA: Dominant vessel.  Mildly diffusely diseased.  PDA has moderate diffuse disease. LM: Calcified, moderate-sized vessel.  Mild disease is noted. LAD: Large vessel, occluded in the proximal to mid segment, there is a small D1 and a large D2 which is D1 equivalent which is occluded in the ostium.  Large SP1.  Mid to distal LAD is mildly diseased. LCx: Moderate caliber vessel, calcified.  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  Gives origin to moderate-sized OM1 which is occluded in its ostium but has ipsilateral collaterals and TIMI I flow. LIMA to LAD patent by angiography on 12/14/2021 and not imaged this time. Free RIMA Y graft to D1 is patent, inferior limb to OM1 occluded. SVG to RCA occluded.   Intervention data: Successful but complex and difficult PCI due to difficulty in engaging the guide catheter, 4 guide catheters  were utilized eventually JL 4 barely engage the left main and I was able to drop the guide catheter after I advanced the balloon   Successful Wolverine cutting balloon angioplasty of in-stent restenotic proximal CX with a 2.75 x 10 mm balloon.  Ostial and mid segment 5 treated.    Recommendation: Patient will be discharged home on aspirin indefinitely and Plavix for a period of 3 months, if he has recurrence of restenosis, we will consider placing him in a clinical trial with drug-coated balloon angioplasty.  Stents were not placed in view of high risk for recurrence.   Approximately 150  to 200 mL of blood was lost during guide catheter exchange.  Patient tolerated the procedure well.  There was no immediate complications.    ASSESSMENT AND PLAN: .      ICD-10-CM   1. Coronary artery disease of native artery of native heart with stable angina pectoris (HCC)  I25.118 ranolazine (RANEXA) 1000 MG SR tablet    2. Orthostatic hypotension  I95.1     3. Blood loss anemia  D50.0       Assessment and Plan    Coronary Artery Disease of the native heart with chronic stable angina pectoris Persistent chest pain despite recent stent placement in a small artery. Discussed the possibility of restenosis and the potential use of a drug-coated balloon if symptoms worsen. Also discussed the potential benefit of Ranexa.  I have extensively discussed with the patient and his wife and given them a diagram of the coronary angiogram and also discussed that the lesion is probably will recur in view of diffuse disease, small vessel, probably even if ever similar to close, he may have angina or NSTEMI but do not think this would be life threatening.  -Continue Aspirin, Plavix, and Zetia.  He is planning on having hip surgery soon sometime in January 2025, he can certainly discontinue Plavix after 3 months since angioplasty on 11/25/2022. -Trial of Ranexa 1000mg  daily for one week. -Consider drug-coated balloon if  symptoms worsen.  Potential Adrenal Insufficiency/orthostatic hypotension Under evaluation by endocrinology (Dr. Evlyn Kanner). Awaiting further test results.  His orthostatic symptoms are resolved since discontinuing diuretics and also losartan, presently only on metoprolol succinate at a low dose.  He has not used midodrine for orthostatic hypotension.  He has not had any further dizziness or falls. -Continue current management as directed by endocrinology.  Anemia Low hemoglobin potentially contributing to chest pain. Currently on oral iron and folic acid. -Consider iron infusion for rapidly improving his hemoglobin and this could also be contributing to orthostatic hypotension..  Chronic Kidney Disease Mild, potentially stage 2 or 3A. Recent labs show normal kidney function. -Continue current management.  Upcoming Hip Surgery Planned for early next year. Discussed the need to stop Plavix prior to surgery. -Stop Plavix 5 days prior to surgery.  Follow-up in 6 months or sooner if symptoms worsen.  This was a 40-minute office visit encounter.  Signed,  Yates Decamp, MD, [facility] 12/17/2022, 12:23 PM

## 2022-12-18 ENCOUNTER — Encounter (INDEPENDENT_AMBULATORY_CARE_PROVIDER_SITE_OTHER): Payer: PPO | Admitting: Ophthalmology

## 2022-12-18 DIAGNOSIS — H43813 Vitreous degeneration, bilateral: Secondary | ICD-10-CM

## 2022-12-18 DIAGNOSIS — H34832 Tributary (branch) retinal vein occlusion, left eye, with macular edema: Secondary | ICD-10-CM | POA: Diagnosis not present

## 2022-12-18 DIAGNOSIS — H35033 Hypertensive retinopathy, bilateral: Secondary | ICD-10-CM

## 2022-12-18 DIAGNOSIS — I10 Essential (primary) hypertension: Secondary | ICD-10-CM

## 2022-12-19 ENCOUNTER — Telehealth: Payer: Self-pay | Admitting: Cardiology

## 2022-12-19 NOTE — Telephone Encounter (Signed)
Call placed to number listed for Erin and this is the wrong number.  I spoke with patient's wife and clarified midodrine directions with her.  Wife reports patient's BP was 137 systolic earlier today when Erin checked it while patient was sitting.  Was 104 systolic when patient stood up. Patient saw Dr Jacinto Halim earlier this week and episodes of chest pain were discussed at this visit.  Wife reports patient has not had increased frequency of pain since this visit and has been having 2-3 episodes per week.  Ranexa was started at recent office visit.  Patient's wife will have Denny Peon call us back if any questions

## 2022-12-19 NOTE — Telephone Encounter (Signed)
Return call placed.  No answer and no voicemail

## 2022-12-19 NOTE — Telephone Encounter (Signed)
Pt c/o medication issue:  1. Name of Medication:   midodrine (PROAMATINE) 2.5 MG tablet    nitroGLYCERIN (NITROSTAT) 0.4 MG SL tablet    2. How are you currently taking this medication (dosage and times per day)?  Take 1 tablet (2.5 mg total) by mouth 3 (three) times daily with meals as needed (SBP less than 100).     TAKE 1 TABLET EVERY 5 MINUTES AS NEEDED FOR CHEST PAIN.        3. Are you having a reaction (difficulty breathing--STAT)? No  4. What is your medication issue? [facility] nurse is requesting a callback for clarity on the first medication and her having concerns about him taking the second medication 4 to 5 times a week now for chest pains. Please advise

## 2022-12-24 NOTE — Progress Notes (Signed)
Biotronik Loop Recorder 

## 2022-12-25 ENCOUNTER — Other Ambulatory Visit: Payer: Self-pay | Admitting: Cardiology

## 2022-12-25 ENCOUNTER — Ambulatory Visit: Payer: Self-pay | Admitting: Cardiology

## 2022-12-25 DIAGNOSIS — N401 Enlarged prostate with lower urinary tract symptoms: Secondary | ICD-10-CM | POA: Diagnosis not present

## 2022-12-25 DIAGNOSIS — R351 Nocturia: Secondary | ICD-10-CM | POA: Diagnosis not present

## 2022-12-25 DIAGNOSIS — E782 Mixed hyperlipidemia: Secondary | ICD-10-CM

## 2022-12-25 DIAGNOSIS — N50812 Left testicular pain: Secondary | ICD-10-CM | POA: Diagnosis not present

## 2022-12-25 DIAGNOSIS — N5201 Erectile dysfunction due to arterial insufficiency: Secondary | ICD-10-CM | POA: Diagnosis not present

## 2022-12-29 DIAGNOSIS — H6591 Unspecified nonsuppurative otitis media, right ear: Secondary | ICD-10-CM | POA: Diagnosis not present

## 2022-12-29 DIAGNOSIS — H6991 Unspecified Eustachian tube disorder, right ear: Secondary | ICD-10-CM | POA: Diagnosis not present

## 2023-01-05 ENCOUNTER — Telehealth: Payer: Self-pay | Admitting: Cardiology

## 2023-01-05 NOTE — Telephone Encounter (Signed)
Follow Up:      [facility] is returning [place]'s call from today.

## 2023-01-05 NOTE — Telephone Encounter (Signed)
Called to report back in Sept when discharged pt's weight was 178 lbs. Today he weighs 189 lbs. Patient states Dr. Jacinto Halim has taken him off fluid pills. RN is wanting to confirm he is aware of weight gain in past 30 days.   She reports either she or pt's wife can receive callback in regards to this.   Please advise.

## 2023-01-05 NOTE — Progress Notes (Unsigned)
Cardiology Clinic Note   Patient Name: [name] Date of Encounter: 01/07/2023  Primary Care Provider:  Gaspar Garbe, MD Primary Cardiologist:  Yates Decamp, MD  Patient Profile    [name] 77 year old male presents the clinic today for evaluation of his swelling and weight gain.  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   Noted on EKG   Left axis deviation 05/2018   Noted on EKG   Lipoma 04/20/2018   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   Loop Biotronik Biomonitor III for syncope and collapse 04/09/2021 04/09/2021   LVH (left ventricular hypertrophy) 05/2018   Noted on EKG   Phimosis    Pneumonia    Syncope and collapse    Tuberculosis 1956   Wears glasses    Wheezing    Past Surgical History:  Procedure Laterality Date   ANGIOPLASTY  2010   3 stents placed   BACK SURGERY     CARDIAC CATHETERIZATION     CATARACT EXTRACTION W/ INTRAOCULAR LENS  IMPLANT, BILATERAL  2012   bilateral   CIRCUMCISION N/A 08/30/2018   Procedure: CIRCUMCISION ADULT;  Surgeon: Ihor Gully, MD;  Location: [facility];  Service: Urology;  Laterality: N/A;   COLONOSCOPY  2013, 2008   CORONARY ANGIOPLASTY     CORONARY ARTERY BYPASS GRAFT  2006   x4   CORONARY ATHERECTOMY N/A 12/24/2021   Procedure: CORONARY ATHERECTOMY;  Surgeon: Yates Decamp, MD;  Location: [facility] INVASIVE CV LAB;   Service: Cardiovascular;  Laterality: N/A;   CORONARY BALLOON ANGIOPLASTY N/A 11/25/2022   Procedure: CORONARY BALLOON ANGIOPLASTY;  Surgeon: Yates Decamp, MD;  Location: [facility] INVASIVE CV LAB;  Service: Cardiovascular;  Laterality: N/A;   CORONARY STENT INTERVENTION N/A 12/24/2021   Procedure: CORONARY STENT INTERVENTION;  Surgeon: Yates Decamp, MD;  Location: [facility] INVASIVE CV LAB;  Service: Cardiovascular;  Laterality: N/A;   ESOPHAGOGASTRODUODENOSCOPY  10/2011   EYE SURGERY     KNEE ARTHROSCOPY  2001   LEFT HEART CATH AND CORS/GRAFTS ANGIOGRAPHY N/A 12/24/2021   Procedure: LEFT HEART CATH AND CORS/GRAFTS ANGIOGRAPHY;  Surgeon: Yates Decamp, MD;  Location: [facility] INVASIVE CV LAB;  Service: Cardiovascular;  Laterality: N/A;   LEFT HEART CATH AND CORS/GRAFTS ANGIOGRAPHY N/A 11/25/2022   Procedure: LEFT HEART CATH AND CORS/GRAFTS ANGIOGRAPHY;  Surgeon: Yates Decamp, MD;  Location: [facility] INVASIVE CV LAB;  Service: Cardiovascular;  Laterality: N/A;   LUMBAR DISC SURGERY  1998   LUMBAR LAMINECTOMY/DECOMPRESSION MICRODISCECTOMY N/A 07/13/2019   Procedure: Laminectomy and Foraminotomy - Lumbar Two-Lumbar Three - Lumbar Three-Lumbar Four;  Surgeon: Tia Alert, MD;  Location: [facility] OR;  Service: Neurosurgery;  Laterality: N/A;  Laminectomy and Foraminotomy - Lumbar Two-Lumbar Three - Lumbar Three-Lumbar Four   ROTATOR CUFF REPAIR Left 05/24/2020  TENDON RECONSTRUCTION Right 10/27/2013   Procedure: RIGHT OPEN TRICEPS REPAIR;  Surgeon: Sheral Apley, MD;  Location: [facility];  Service: Orthopedics;  Laterality: Right;   TOTAL HIP ARTHROPLASTY Left 08/05/2022   Procedure: TOTAL HIP ARTHROPLASTY;  Surgeon: Teryl Lucy, MD;  Location: [facility]S;  Service: Orthopedics;  Laterality: Left;    Allergies  Allergies  Allergen Reactions   Codeine Nausea And Vomiting   Guaifenesin & Derivatives Nausea And Vomiting   Terbinafine And Related Other (See Comments)    Muscle pain   Iodinated Contrast Media Itching    Penicillins Rash    Tolerates Cefazolin without problems...WW    History of Present Illness    CHOICE MOUDRY has a PMH of HTN, HLD, presyncope, coronary artery disease, CKD stage IIIa, CABG in 2006, stable angina, and is status post angioplasty of distal left main and ostial, proximal circumflex 12/24/2021.  He was noted to have patent LIMA-LAD and free radial graft with ostial stent to D1 and OM1.  Had loop recorder implanted 04/09/2021  There was suspicion for in-stent restenosis in his circumflex.  He underwent repeat catheterization 9/24.  He received Cutting Balloon angioplasty.  He was admitted 11/27/2022 and discharged on 12/02/2022.  He had severe orthostatic hypotension related to ARB and diuretics.  They were discontinued.  He was seen in follow-up by Dr. Jacinto Halim on 12/17/2022.  During that time he had improvement in his angina and dyspnea.  He had not yet returned to his baseline.  His blood pressure was noted to be 130/72.  His pulse was 60.  He was noted to have a midsystolic murmur 2/6 heard along right upper sternal border.  He was started on Ranexa 1000 mg daily.  His orthostatic symptoms resolved after discontinuing diuretic therapy as well as losartan.  It was also felt that his low hemoglobin could be contributing to his chest discomfort.  He presents to the clinic today for follow-up evaluation and states he has gained about 15 pounds or more since being discharged from the hospital.  We reviewed his cardiac catheterization.  He and his wife expressed understanding.  He reports that he has been trying to increase his physical activity but notices increased shortness of breath with increased physical activity.  He was previously taking ARB and diuresis.  It was felt that this caused orthostatic hypotension.  It appears that some of his weight gain is related to fluid.  He does not endorse eating a higher calorie diet.  His weight today the clinic is 192.9 pounds.  We reviewed his  medications.  He expressed understanding.  I will start 12.5 mg of HCTZ, recheck BMP in 1 week and plan follow-up in 1 month.  His right radial cath site has healed well and there is no signs of infection.  He may start cardiac rehab..  Today he denies chest pain, shortness of breath, lower extremity edema, fatigue, palpitations, melena, hematuria, hemoptysis, diaphoresis, weakness, presyncope, syncope, orthopnea, and PND.      Home Medications    Prior to Admission medications   Medication Sig Start Date End Date Taking? Authorizing Provider  acetaminophen (TYLENOL) 500 MG tablet Take 1,000 mg by mouth every 6 (six) hours as needed for moderate pain.    [provider]  aspirin 81 MG chewable tablet Chew 81 mg by mouth every evening.    [provider]  Calcium Carbonate Antacid (TUMS ULTRA PO) Take 2 tablets by mouth daily as needed (acid reflux/indigestion.).  [provider]  clopidogrel (PLAVIX) 75 MG tablet Take 1 tablet (75 mg total) by mouth daily. 11/25/22   Yates Decamp, MD  Coenzyme Q10 (COQ10 PO) Take 300 mg by mouth 2 (two) times daily.    [provider]  Cyanocobalamin (VITAMIN B-12) 5000 MCG TBDP Take 5,000 mcg by mouth every evening. 12/07/20   [provider]  ezetimibe (ZETIA) 10 MG tablet TAKE 1/2 TABLET BY MOUTH EVERY EVENING 08/21/21   Yates Decamp, MD  ferrous sulfate 325 (65 FE) MG tablet Take 1 tablet (325 mg total) by mouth daily with breakfast. 12/03/22   Regalado, Belkys A, MD  fish oil-omega-3 fatty acids 1000 MG capsule Take 1 g by mouth in the morning and at bedtime.    [provider]  fluconazole (DIFLUCAN) 200 MG tablet Take 200 mg by mouth every Saturday. Patient not taking: Reported on 12/17/2022 05/14/22   [provider]  fluticasone (FLONASE) 50 MCG/ACT nasal spray Place 1 spray into both nostrils daily. 12/02/22   Regalado, Belkys A, MD  folic acid (FOLVITE) 1 MG tablet Take 1 tablet (1 mg total) by  mouth daily. 12/02/22   Regalado, Belkys A, MD  hydrocortisone (CORTEF) 10 MG tablet Take 1 tablet (10 mg total) by mouth in the morning. 12/02/22   Regalado, Belkys A, MD  hydrocortisone (CORTEF) 5 MG tablet Take 1 tablet (5 mg total) by mouth daily in the afternoon. 12/02/22   Regalado, Belkys A, MD  metoprolol succinate (TOPROL-XL) 25 MG 24 hr tablet TAKE 1/2 TABLET BY MOUTH EVERY MORNING 11/24/22   Yates Decamp, MD  midodrine (PROAMATINE) 2.5 MG tablet Take 1 tablet (2.5 mg total) by mouth 3 (three) times daily with meals as needed (SBP less than 100). 12/02/22   Regalado, Jon Billings A, MD  Multiple Vitamin (MULTIVITAMIN) tablet Take 1 tablet by mouth in the morning. Seniors    [provider]  nitroGLYCERIN (NITROSTAT) 0.4 MG SL tablet TAKE 1 TABLET EVERY 5 MINUTES AS NEEDED FOR CHEST PAIN. 11/13/22   Yates Decamp, MD  pantoprazole (PROTONIX) 40 MG tablet Take 1 tablet (40 mg total) by mouth daily. 03/17/16   Elgergawy, Leana Roe, MD  Polyethyl Glycol-Propyl Glycol (SYSTANE) 0.4-0.3 % GEL ophthalmic gel Place 1 application into both eyes at bedtime.    [provider]  Propylene Glycol (SYSTANE BALANCE) 0.6 % SOLN Place 1 drop into both eyes 3 (three) times daily as needed (dry/irritated eyes.).    [provider]  pyridoxine (B-6) 100 MG tablet Take 100 mg by mouth every evening.    [provider]  ranolazine (RANEXA) 1000 MG SR tablet Take 1 tablet (1,000 mg total) by mouth daily. 12/17/22   Yates Decamp, MD  rosuvastatin (CRESTOR) 20 MG tablet TAKE 1 TABLET BY MOUTH EVERY DAY AT BEDTIME 12/26/22   Yates Decamp, MD  traMADol (ULTRAM) 50 MG tablet Take 50 mg by mouth 3 (three) times daily as needed for moderate pain. 10/20/22   [provider]  vitamin C (ASCORBIC ACID) 500 MG tablet Take 500 mg by mouth daily.    [provider]    Family History    Family History  Problem Relation Age of Onset   Colon cancer Mother 36   Hypertension Mother    Colon cancer  Father 55   Heart attack Brother    Heart failure Brother    Stomach cancer Neg Hx    He indicated that his mother is deceased. He indicated that his  father is deceased. He indicated that only one of his three brothers is alive. He indicated that the status of his neg hx is unknown.  Social History    Social History   Socioeconomic History   Marital status: Married    Spouse name: Not on file   Number of children: 3   Years of education: 11   Highest education level: 11th grade  Occupational History   Occupation: Retired  Tobacco Use   Smoking status: Never   Smokeless tobacco: Never  Vaping Use   Vaping status: Never Used  Substance and Sexual Activity   Alcohol use: No   Drug use: No   Sexual activity: Not Currently  Other Topics Concern   Not on file  Social History Narrative   Not on file   Social Determinants of Health   Financial Resource Strain: Not on file  Food Insecurity: No Food Insecurity (11/28/2022)   Hunger Vital Sign    Worried About Running Out of Food in the Last Year: Never true    Ran Out of Food in the Last Year: Never true  Transportation Needs: No Transportation Needs (11/28/2022)   PRAPARE - Administrator, Civil Service (Medical): No    Lack of Transportation (Non-Medical): No  Physical Activity: Not on file  Stress: Not on file  Social Connections: Not on file  Intimate Partner Violence: Not At Risk (11/28/2022)   Humiliation, Afraid, Rape, and Kick questionnaire    Fear of Current or Ex-Partner: No    Emotionally Abused: No    Physically Abused: No    Sexually Abused: No     Review of Systems    General:  No chills, fever, night sweats or weight changes.  Cardiovascular:  No chest pain, dyspnea on exertion, edema, orthopnea, palpitations, paroxysmal nocturnal dyspnea. Dermatological: No rash, lesions/masses Respiratory: No cough, dyspnea Urologic: No hematuria, dysuria Abdominal:   No nausea, vomiting, diarrhea, bright  red blood per rectum, melena, or hematemesis Neurologic:  No visual changes, wkns, changes in mental status. All other systems reviewed and are otherwise negative except as noted above.  Physical Exam    VS:  BP 136/66 (BP Location: Left Arm, Patient Position: Sitting, Cuff Size: Normal)   Pulse 62   Ht 5\' 7"  (1.702 m)   Wt 192 lb 9.6 oz (87.4 kg)   SpO2 96%   BMI 30.17 kg/m  , BMI Body mass index is 30.17 kg/m. GEN: Well nourished, well developed, in no acute distress.  Abdominal distention HEENT: normal. Neck: Supple, no JVD, carotid bruits, or masses. Cardiac: RRR, no murmurs, rubs, or gallops. No clubbing, cyanosis, edema.  Radials/DP/PT 2+ and equal bilaterally.  Respiratory:  Respirations regular and unlabored, clear to auscultation bilaterally. GI: Soft, nontender, nondistended, BS + x 4. MS: no deformity or atrophy. Skin: warm and dry, no rash. Neuro:  Strength and sensation are intact. Psych: Normal affect.  Accessory Clinical Findings    Recent Labs: 11/13/2022: NT-Pro BNP 667 11/27/2022: ALT 33 11/30/2022: Magnesium 1.9; TSH 1.905 12/01/2022: B Natriuretic Peptide 101.7 12/02/2022: BUN 15; Creatinine, Ser 1.21; Hemoglobin 8.4; Platelets 228; Potassium 3.9; Sodium 131   Recent Lipid Panel    Component Value Date/Time   CHOL 84 03/16/2016 1521   TRIG 67 03/16/2016 1521   HDL 32 (L) 03/16/2016 1521   CHOLHDL 2.6 03/16/2016 1521   VLDL 13 03/16/2016 1521   LDLCALC 39 03/16/2016 1521         ECG personally reviewed  by me today- EKG Interpretation Date/Time:  Wednesday January 07 2023 14:46:44 EDT Ventricular Rate:  62 PR Interval:  148 QRS Duration:  122 QT Interval:  430 QTC Calculation: 436 R Axis:   -52  Text Interpretation: Normal sinus rhythm Left anterior fascicular block Left ventricular hypertrophy with QRS widening ( R in aVL , Cornell product ) Confirmed by Edd Fabian [phone]) on 01/07/2023 3:15:50 PM    Left heart cath 11/25/2022  Left Heart  Catheterization 11/25/22:  Hemodynamic data: LV: 189/5, EDP 20 mmHg.  Ao 187/61, mean 114 mmHg.  No pressure gradient across the aortic valve.    RCA: Dominant vessel.  Mildly diffusely diseased.  PDA has moderate diffuse disease. LM: Calcified, moderate-sized vessel.  Mild disease is noted. LAD: Large vessel, occluded in the proximal to mid segment, there is a small D1 and a large D2 which is D1 equivalent which is occluded in the ostium.  Large SP1.  Mid to distal LAD is mildly diseased. LCx: Moderate caliber vessel, calcified.  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  Gives origin to moderate-sized OM1 which is occluded in its ostium but has ipsilateral collaterals and TIMI I flow. LIMA to LAD patent by angiography on 12/14/2021 and not imaged this time. Free RIMA Y graft to D1 is patent, inferior limb to OM1 occluded. SVG to RCA occluded.   Intervention data: Successful but complex and difficult PCI due to difficulty in engaging the guide catheter, 4 guide catheters were utilized eventually JL 4 barely engage the left main and I was able to drop the guide catheter after I advanced the balloon   Successful Wolverine cutting balloon angioplasty of in-stent restenotic proximal CX with a 2.75 x 10 mm balloon.  Ostial and mid segment 5 treated.    Recommendation: Patient will be discharged home on aspirin indefinitely and Plavix for a period of 3 months, if he has recurrence of restenosis, we will consider placing him in a clinical trial with drug-coated balloon angioplasty.  Stents were not placed in view of high risk for recurrence.   Approximately 150 to 200 mL of blood was lost during guide catheter exchange.  Patient tolerated the procedure well.  There was no immediate complications.        Assessment & Plan   1.  Coronary artery disease-chest pain somewhat improved with addition of Ranexa.  Had successful PCI with Cutting  Balloon angioplasty of his restenosed proximal circumflex. Continue Ranexa, aspirin, Plavix, ezetimibe, fish oil, metoprolol, rosuvastatin Heart healthy low-sodium diet Increase physical activity as tolerated  Hyperlipidemia-LDL 45 on 05/28/22. High-fiber diet Continue ezetimibe, fish oil, rosuvastatin  Essential hypertension-blood pressure today 136/66.  Hypotension resolved with discontinuation of diuresis and losartan. Continue metoprolol Restart HCTZ 12.5 mg daily Maintain p.o. hydration Maintain blood pressure log Daily weight log Repeat BMP in 1 week  Disposition: Follow-up with Dr. Jacinto Halim or APP in 1 months.    Thomasene Ripple. Dayane Hillenburg NP-C     01/07/2023, 3:16 PM [facility] [address] Office [phone] Fax [phone]    I spent 14 minutes examining this patient, reviewing medications, and using patient centered shared decision making involving her cardiac care.   I spent greater than 20 minutes reviewing her past medical history,  medications, and prior cardiac tests.

## 2023-01-05 NOTE — Telephone Encounter (Signed)
Spoke with Archie Patten, RN who saw the patient today. She states that she called to make [place] aware of weight gain because she felt like the patient would not tell us otherwise. She states that he has been educated to continue monitor his weight and swelling. She reports no swelling in his legs but he does have a tight abdomen. She states that she also talked to his wife and she is aware of concerning symptoms and when to call us. She states that wife reports that she does good about limiting his sodium intake.    Called patient and scheduled him for an appointment with APP on Wednesday 10/30

## 2023-01-05 NOTE — Telephone Encounter (Signed)
Spoke with the patient who states that he has been feeling fair. He does report some weight gain. States that he does have some shortness of breath with exertion. He denies any chest pain. He reports overall feeling fair. He reports that he wears compression hose which help with leg swelling. He has noticed some swelling in his stomach. He reports blood pressure has been up and down. He does not have any readings to give me. He reports that he does not eat a lot of food high in sodium. He was previously on Maxzide which was stopped.   I have left a message with Archie Patten, RN to discuss further.

## 2023-01-07 ENCOUNTER — Ambulatory Visit: Payer: PPO | Attending: General Practice | Admitting: General Practice

## 2023-01-07 ENCOUNTER — Encounter: Payer: Self-pay | Admitting: General Practice

## 2023-01-07 VITALS — BP 136/66 | HR 62 | Ht 67.0 in | Wt 192.6 lb

## 2023-01-07 DIAGNOSIS — I959 Hypotension, unspecified: Secondary | ICD-10-CM | POA: Diagnosis not present

## 2023-01-07 DIAGNOSIS — E782 Mixed hyperlipidemia: Secondary | ICD-10-CM | POA: Diagnosis not present

## 2023-01-07 DIAGNOSIS — I25118 Atherosclerotic heart disease of native coronary artery with other forms of angina pectoris: Secondary | ICD-10-CM

## 2023-01-07 DIAGNOSIS — R0602 Shortness of breath: Secondary | ICD-10-CM | POA: Diagnosis not present

## 2023-01-07 MED ORDER — HYDROCHLOROTHIAZIDE 12.5 MG PO CAPS: 12.5000 mg | ORAL_CAPSULE | Freq: Every day | ORAL | 3 refills | Status: DC

## 2023-01-07 NOTE — Patient Instructions (Addendum)
Medication Instructions:  Start Hydrochlorothiazide 12.5 mg once a day *If you need a refill on your cardiac medications before your next appointment, please call your pharmacy*  Lab Work: 1 week BMP If you have labs (blood work) drawn today and your tests are completely normal, you will receive your results only by: MyChart Message (if you have MyChart) OR A paper copy in the mail If you have any lab test that is abnormal or we need to change your treatment, we will call you to review the results.  Testing/Procedures: No testing  Follow-Up: At [facility], you and your health needs are our priority.  As part of our continuing mission to provide you with exceptional heart care, we have created designated Provider Care Teams.  These Care Teams include your primary Cardiologist (physician) and Advanced Practice Providers (APPs -  Physician Assistants and Nurse Practitioners) who all work together to provide you with the care you need, when you need it.  We recommend signing up for the patient portal called "MyChart".  Sign up information is provided on this After Visit Summary.  MyChart is used to connect with patients for Virtual Visits (Telemedicine).  Patients are able to view lab/test results, encounter notes, upcoming appointments, etc.  Non-urgent messages can be sent to your provider as well.   To learn more about what you can do with MyChart, go to [email or web address].    Your next appointment:   1 month(s)  Provider:   Edd Fabian, FNP  Other Instructions Keep Weight log for the next month, check blood pressure directly after urinating in the morning.

## 2023-01-12 ENCOUNTER — Ambulatory Visit (INDEPENDENT_AMBULATORY_CARE_PROVIDER_SITE_OTHER): Payer: PPO

## 2023-01-12 DIAGNOSIS — R55 Syncope and collapse: Secondary | ICD-10-CM

## 2023-01-13 LAB — CUP PACEART REMOTE DEVICE CHECK
Date Time Interrogation Session: 20241105110816
Implantable Pulse Generator Implant Date: 20230131
Pulse Gen Model: 436066
Pulse Gen Serial Number: 94078760

## 2023-01-14 DIAGNOSIS — R0602 Shortness of breath: Secondary | ICD-10-CM | POA: Diagnosis not present

## 2023-01-15 ENCOUNTER — Encounter (INDEPENDENT_AMBULATORY_CARE_PROVIDER_SITE_OTHER): Payer: PPO | Admitting: Ophthalmology

## 2023-01-15 DIAGNOSIS — H35033 Hypertensive retinopathy, bilateral: Secondary | ICD-10-CM | POA: Diagnosis not present

## 2023-01-15 DIAGNOSIS — H34832 Tributary (branch) retinal vein occlusion, left eye, with macular edema: Secondary | ICD-10-CM

## 2023-01-15 DIAGNOSIS — I10 Essential (primary) hypertension: Secondary | ICD-10-CM | POA: Diagnosis not present

## 2023-01-15 DIAGNOSIS — H43813 Vitreous degeneration, bilateral: Secondary | ICD-10-CM | POA: Diagnosis not present

## 2023-01-15 LAB — BASIC METABOLIC PANEL
BUN/Creatinine Ratio: 16 (ref 10–24)
BUN: 24 mg/dL (ref 8–27)
CO2: 25 mmol/L (ref 20–29)
Calcium: 9.5 mg/dL (ref 8.6–10.2)
Chloride: 95 mmol/L — ABNORMAL LOW (ref 96–106)
Creatinine, Ser: 1.47 mg/dL — ABNORMAL HIGH (ref 0.76–1.27)
Glucose: 131 mg/dL — ABNORMAL HIGH (ref 70–99)
Potassium: 4.2 mmol/L (ref 3.5–5.2)
Sodium: 134 mmol/L (ref 134–144)
eGFR: 49 mL/min/{1.73_m2} — ABNORMAL LOW (ref >59)

## 2023-01-21 DIAGNOSIS — M25551 Pain in right hip: Secondary | ICD-10-CM | POA: Diagnosis not present

## 2023-01-27 ENCOUNTER — Other Ambulatory Visit: Payer: Self-pay | Admitting: Cardiology

## 2023-01-27 DIAGNOSIS — I25118 Atherosclerotic heart disease of native coronary artery with other forms of angina pectoris: Secondary | ICD-10-CM

## 2023-01-28 NOTE — Progress Notes (Unsigned)
Cardiology Clinic Note   Patient Name: [name] Date of Encounter: 02/03/2023  Primary Care Provider:  Gaspar Garbe, MD Primary Cardiologist:  Yates Decamp, MD  Patient Profile    [name] 77 year old male presents the clinic today for follow-up evaluation of his swelling and weight gain.  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   Noted on EKG   Left axis deviation 05/2018   Noted on EKG   Lipoma 04/20/2018   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   Loop Biotronik Biomonitor III for syncope and collapse 04/09/2021 04/09/2021   LVH (left ventricular hypertrophy) 05/2018   Noted on EKG   Phimosis    Pneumonia    Syncope and collapse    Tuberculosis 1956   Wears glasses    Wheezing    Past Surgical History:  Procedure Laterality Date   ANGIOPLASTY  2010   3 stents placed   BACK SURGERY     CARDIAC CATHETERIZATION     CATARACT EXTRACTION W/ INTRAOCULAR LENS  IMPLANT, BILATERAL  2012   bilateral   CIRCUMCISION N/A 08/30/2018   Procedure: CIRCUMCISION ADULT;  Surgeon: Ihor Gully, MD;  Location: [facility] [place];  Service: Urology;  Laterality: N/A;   COLONOSCOPY  2013, 2008   CORONARY ANGIOPLASTY     CORONARY ARTERY BYPASS GRAFT  2006   x4   CORONARY ATHERECTOMY N/A 12/24/2021   Procedure: CORONARY ATHERECTOMY;  Surgeon: Yates Decamp, MD;  Location: [facility] INVASIVE CV  LAB;  Service: Cardiovascular;  Laterality: N/A;   CORONARY BALLOON ANGIOPLASTY N/A 11/25/2022   Procedure: CORONARY BALLOON ANGIOPLASTY;  Surgeon: Yates Decamp, MD;  Location: [facility] INVASIVE CV LAB;  Service: Cardiovascular;  Laterality: N/A;   CORONARY STENT INTERVENTION N/A 12/24/2021   Procedure: CORONARY STENT INTERVENTION;  Surgeon: Yates Decamp, MD;  Location: [facility] INVASIVE CV LAB;  Service: Cardiovascular;  Laterality: N/A;   ESOPHAGOGASTRODUODENOSCOPY  10/2011   EYE SURGERY     KNEE ARTHROSCOPY  2001   LEFT HEART CATH AND CORS/GRAFTS ANGIOGRAPHY N/A 12/24/2021   Procedure: LEFT HEART CATH AND CORS/GRAFTS ANGIOGRAPHY;  Surgeon: Yates Decamp, MD;  Location: [facility] INVASIVE CV LAB;  Service: Cardiovascular;  Laterality: N/A;   LEFT HEART CATH AND CORS/GRAFTS ANGIOGRAPHY N/A 11/25/2022   Procedure: LEFT HEART CATH AND CORS/GRAFTS ANGIOGRAPHY;  Surgeon: Yates Decamp, MD;  Location: [facility] INVASIVE CV LAB;  Service: Cardiovascular;  Laterality: N/A;   LUMBAR DISC SURGERY  1998   LUMBAR LAMINECTOMY/DECOMPRESSION MICRODISCECTOMY N/A 07/13/2019   Procedure: Laminectomy and Foraminotomy - Lumbar Two-Lumbar Three - Lumbar Three-Lumbar Four;  Surgeon: Tia Alert, MD;  Location: [facility] OR;  Service: Neurosurgery;  Laterality: N/A;  Laminectomy and Foraminotomy - Lumbar Two-Lumbar Three - Lumbar Three-Lumbar Four   ROTATOR CUFF REPAIR Left 05/24/2020  TENDON RECONSTRUCTION Right 10/27/2013   Procedure: RIGHT OPEN TRICEPS REPAIR;  Surgeon: Sheral Apley, MD;  Location: [facility];  Service: Orthopedics;  Laterality: Right;   TOTAL HIP ARTHROPLASTY Left 08/05/2022   Procedure: TOTAL HIP ARTHROPLASTY;  Surgeon: Teryl Lucy, MD;  Location: [facility] ORS;  Service: Orthopedics;  Laterality: Left;    Allergies  Allergies  Allergen Reactions   Codeine Nausea And Vomiting   Guaifenesin & Derivatives Nausea And Vomiting   Terbinafine And Related Other (See Comments)    Muscle pain   Iodinated Contrast Media  Itching   Penicillins Rash    Tolerates Cefazolin without problems...WW    History of Present Illness    [name] has a PMH of HTN, HLD, presyncope, coronary artery disease, CKD stage IIIa, CABG in 2006, stable angina, and is status post angioplasty of distal left main and ostial, proximal circumflex 12/24/2021.  He was noted to have patent LIMA-LAD and free radial graft with ostial stent to D1 and OM1.  Had loop recorder implanted 04/09/2021  There was suspicion for in-stent restenosis in his circumflex.  He underwent repeat catheterization 9/24.  He received Cutting Balloon angioplasty.  He was admitted 11/27/2022 and discharged on 12/02/2022.  He had severe orthostatic hypotension related to ARB and diuretics.  They were discontinued.  He was seen in follow-up by Dr. Jacinto Halim on 12/17/2022.  During that time he had improvement in his angina and dyspnea.  He had not yet returned to his baseline.  His blood pressure was noted to be 130/72.  His pulse was 60.  He was noted to have a midsystolic murmur 2/6 heard along right upper sternal border.  He was started on Ranexa 1000 mg daily.  His orthostatic symptoms resolved after discontinuing diuretic therapy as well as losartan.  It was also felt that his low hemoglobin could be contributing to his chest discomfort.  He presented to the clinic 01/07/23 for follow-up evaluation and stated he had gained about 15 pounds or more since being discharged from the hospital.  We reviewed his cardiac catheterization.  He and his wife expressed understanding.  He reported that he had been trying to increase his physical activity but noticed increased shortness of breath with increased physical activity.  He was previously taking ARB and diuresis.  It was felt that the medications caused orthostatic hypotension.  It appeared that some of his weight gain was related to fluid.  He did not endorse eating a higher calorie diet.  His weight in the clinic was 192.9  pounds.  We reviewed his medications.  He expressed understanding.  I started 12.5 mg of HCTZ, recheck BMP in 1 week and plan follow-up in 1 month.  His right radial cath site has healed well and there is no signs of infection.  He may start cardiac rehab.  His repeat creatinine was 1.47 on 01/14/2023.  I changed his HCTZ to every other day.  He presents to the clinic today for follow-up evaluation and states he has had a couple episodes of chest discomfort in the morning.  Episodes were relieved with nitroglycerin.  He has not had any exertional chest discomfort.  He has been doing physical therapy regularly and doing his walking daily.  He brings in blood pressure log today which shows well-controlled blood pressure in the 120s over 60s-70s.  His pulse is also been in the 60s-70s.  His weight has increased by about 3 pounds.  We reviewed his hydration.  He has been drinking around 8 glasses of milk tea and water daily.  I recommended that he consume around 6 or 48 to 50 ounces per day.  I will repeat his BMP today, continue his current medication regimen, and plan follow-up with Dr. Jacinto Halim in April..  Today he denies shortness of breath, lower extremity edema, fatigue, palpitations, melena, hematuria, hemoptysis, diaphoresis, weakness, presyncope, syncope, orthopnea, and PND.      Home Medications    Prior to Admission medications   Medication Sig Start Date End Date Taking? Authorizing Provider  acetaminophen (TYLENOL) 500 MG tablet Take 1,000 mg by mouth every 6 (six) hours as needed for moderate pain.    [provider]  aspirin 81 MG chewable tablet Chew 81 mg by mouth every evening.    [provider]  Calcium Carbonate Antacid (TUMS ULTRA PO) Take 2 tablets by mouth daily as needed (acid reflux/indigestion.).    [provider]  clopidogrel (PLAVIX) 75 MG tablet Take 1 tablet (75 mg total) by mouth daily. 11/25/22   Yates Decamp, MD  Coenzyme Q10 (COQ10 PO) Take 300  mg by mouth 2 (two) times daily.    [provider]  Cyanocobalamin (VITAMIN B-12) 5000 MCG TBDP Take 5,000 mcg by mouth every evening. 12/07/20   [provider]  ezetimibe (ZETIA) 10 MG tablet TAKE 1/2 TABLET BY MOUTH EVERY EVENING 08/21/21   Yates Decamp, MD  ferrous sulfate 325 (65 FE) MG tablet Take 1 tablet (325 mg total) by mouth daily with breakfast. 12/03/22   Regalado, Belkys A, MD  fish oil-omega-3 fatty acids 1000 MG capsule Take 1 g by mouth in the morning and at bedtime.    [provider]  fluconazole (DIFLUCAN) 200 MG tablet Take 200 mg by mouth every Saturday. Patient not taking: Reported on 12/17/2022 05/14/22   [provider]  fluticasone (FLONASE) 50 MCG/ACT nasal spray Place 1 spray into both nostrils daily. 12/02/22   Regalado, Belkys A, MD  folic acid (FOLVITE) 1 MG tablet Take 1 tablet (1 mg total) by mouth daily. 12/02/22   Regalado, Belkys A, MD  hydrocortisone (CORTEF) 10 MG tablet Take 1 tablet (10 mg total) by mouth in the morning. 12/02/22   Regalado, Belkys A, MD  hydrocortisone (CORTEF) 5 MG tablet Take 1 tablet (5 mg total) by mouth daily in the afternoon. 12/02/22   Regalado, Belkys A, MD  metoprolol succinate (TOPROL-XL) 25 MG 24 hr tablet TAKE 1/2 TABLET BY MOUTH EVERY MORNING 11/24/22   Yates Decamp, MD  midodrine (PROAMATINE) 2.5 MG tablet Take 1 tablet (2.5 mg total) by mouth 3 (three) times daily with meals as needed (SBP less than 100). 12/02/22   Regalado, Jon Billings A, MD  Multiple Vitamin (MULTIVITAMIN) tablet Take 1 tablet by mouth in the morning. Seniors    [provider]  nitroGLYCERIN (NITROSTAT) 0.4 MG SL tablet TAKE 1 TABLET EVERY 5 MINUTES AS NEEDED FOR CHEST PAIN. 11/13/22   Yates Decamp, MD  pantoprazole (PROTONIX) 40 MG tablet Take 1 tablet (40 mg total) by mouth daily. 03/17/16   Elgergawy, Leana Roe, MD  Polyethyl Glycol-Propyl Glycol (SYSTANE) 0.4-0.3 % GEL ophthalmic gel Place 1 application into both eyes at bedtime.     [provider]  Propylene Glycol (SYSTANE BALANCE) 0.6 % SOLN Place 1 drop into both eyes 3 (three) times daily as needed (dry/irritated eyes.).    [provider]  pyridoxine (B-6) 100 MG tablet Take 100 mg by mouth every evening.  [provider]  ranolazine (RANEXA) 1000 MG SR tablet Take 1 tablet (1,000 mg total) by mouth daily. 12/17/22   Yates Decamp, MD  rosuvastatin (CRESTOR) 20 MG tablet TAKE 1 TABLET BY MOUTH EVERY DAY AT BEDTIME 12/26/22   Yates Decamp, MD  traMADol (ULTRAM) 50 MG tablet Take 50 mg by mouth 3 (three) times daily as needed for moderate pain. 10/20/22   [provider]  vitamin C (ASCORBIC ACID) 500 MG tablet Take 500 mg by mouth daily.    [provider]    Family History    Family History  Problem Relation Age of Onset   Colon cancer Mother 67   Hypertension Mother    Colon cancer Father 2   Heart attack Brother    Heart failure Brother    Stomach cancer Neg Hx    He indicated that his mother is deceased. He indicated that his father is deceased. He indicated that only one of his three brothers is alive. He indicated that the status of his neg hx is unknown.  Social History    Social History   Socioeconomic History   Marital status: Married    Spouse name: Not on file   Number of children: 3   Years of education: 11   Highest education level: 11th grade  Occupational History   Occupation: Retired  Tobacco Use   Smoking status: Never   Smokeless tobacco: Never  Vaping Use   Vaping status: Never Used  Substance and Sexual Activity   Alcohol use: No   Drug use: No   Sexual activity: Not Currently  Other Topics Concern   Not on file  Social History Narrative   Not on file   Social Determinants of Health   Financial Resource Strain: Not on file  Food Insecurity: No Food Insecurity (11/28/2022)   Hunger Vital Sign    Worried About Running Out of Food in the Last Year: Never true    Ran Out of Food in  the Last Year: Never true  Transportation Needs: No Transportation Needs (11/28/2022)   PRAPARE - Administrator, Civil Service (Medical): No    Lack of Transportation (Non-Medical): No  Physical Activity: Not on file  Stress: Not on file  Social Connections: Not on file  Intimate Partner Violence: Not At Risk (11/28/2022)   Humiliation, Afraid, Rape, and Kick questionnaire    Fear of Current or Ex-Partner: No    Emotionally Abused: No    Physically Abused: No    Sexually Abused: No     Review of Systems    General:  No chills, fever, night sweats or weight changes.  Cardiovascular:  No chest pain, dyspnea on exertion, edema, orthopnea, palpitations, paroxysmal nocturnal dyspnea. Dermatological: No rash, lesions/masses Respiratory: No cough, dyspnea Urologic: No hematuria, dysuria Abdominal:   No nausea, vomiting, diarrhea, bright red blood per rectum, melena, or hematemesis Neurologic:  No visual changes, wkns, changes in mental status. All other systems reviewed and are otherwise negative except as noted above.  Physical Exam    VS:  BP 128/80   Pulse (!) 58   Ht 5\' 7"  (1.702 m)   Wt 197 lb 3.2 oz (89.4 kg)   SpO2 91%   BMI 30.89 kg/m  , BMI Body mass index is 30.89 kg/m. GEN: Well nourished, well developed, in no acute distress.  Abdominal distention HEENT: normal. Neck: Supple, no JVD, carotid bruits, or masses. Cardiac: RRR, no murmurs, rubs, or gallops. No  clubbing, cyanosis, edema.  Radials/DP/PT 2+ and equal bilaterally.  Respiratory:  Respirations regular and unlabored, clear to auscultation bilaterally. GI: Soft, nontender, nondistended, BS + x 4. MS: no deformity or atrophy. Skin: warm and dry, no rash. Neuro:  Strength and sensation are intact. Psych: Normal affect.  Accessory Clinical Findings    Recent Labs: 11/13/2022: NT-Pro BNP 667 11/27/2022: ALT 33 11/30/2022: Magnesium 1.9; TSH 1.905 12/01/2022: B Natriuretic Peptide 101.7 12/02/2022:  Hemoglobin 8.4; Platelets 228 01/14/2023: BUN 24; Creatinine, Ser 1.47; Potassium 4.2; Sodium 134   Recent Lipid Panel    Component Value Date/Time   CHOL 84 03/16/2016 1521   TRIG 67 03/16/2016 1521   HDL 32 (L) 03/16/2016 1521   CHOLHDL 2.6 03/16/2016 1521   VLDL 13 03/16/2016 1521   LDLCALC 39 03/16/2016 1521         ECG personally reviewed by me today-none today.   Left heart cath 11/25/2022  Left Heart Catheterization 11/25/22:  Hemodynamic data: LV: 189/5, EDP 20 mmHg.  Ao 187/61, mean 114 mmHg.  No pressure gradient across the aortic valve.    RCA: Dominant vessel.  Mildly diffusely diseased.  PDA has moderate diffuse disease. LM: Calcified, moderate-sized vessel.  Mild disease is noted. LAD: Large vessel, occluded in the proximal to mid segment, there is a small D1 and a large D2 which is D1 equivalent which is occluded in the ostium.  Large SP1.  Mid to distal LAD is mildly diseased. LCx: Moderate caliber vessel, calcified.  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  Gives origin to moderate-sized OM1 which is occluded in its ostium but has ipsilateral collaterals and TIMI I flow. LIMA to LAD patent by angiography on 12/14/2021 and not imaged this time. Free RIMA Y graft to D1 is patent, inferior limb to OM1 occluded. SVG to RCA occluded.   Intervention data: Successful but complex and difficult PCI due to difficulty in engaging the guide catheter, 4 guide catheters were utilized eventually JL 4 barely engage the left main and I was able to drop the guide catheter after I advanced the balloon   Successful Wolverine cutting balloon angioplasty of in-stent restenotic proximal CX with a 2.75 x 10 mm balloon.  Ostial and mid segment 5 treated.    Recommendation: Patient will be discharged home on aspirin indefinitely and Plavix for a period of 3 months, if he has recurrence of restenosis, we will consider placing  him in a clinical trial with drug-coated balloon angioplasty.  Stents were not placed in view of high risk for recurrence.   Approximately 150 to 200 mL of blood was lost during guide catheter exchange.  Patient tolerated the procedure well.  There was no immediate complications.        Assessment & Plan   1.  Essential hypertension-blood pressure today 128/80.  Hypotension resolved with discontinuation of diuresis and losartan. Continue metoprolol Continue HCTZ 12.5 mg every other day Maintain p.o. hydration-recommend 48 to 50 ounces daily Maintain blood pressure log Daily weight log Repeat BMP   Hyperlipidemia-LDL 45 on 05/28/22. High-fiber diet Continue ezetimibe, fish oil, rosuvastatin Plan for repeat lipid panel 3/25 Continue increase physical activity  Coronary artery disease-no chest pain today. Denies exertional chest discomfort. Had successful PCI with Cutting Balloon angioplasty of his restenosed proximal circumflex.  Has had a couple episodes of chest discomfort at rest that were relieved by sublingual nitroglycerin. Continue Ranexa, aspirin, Plavix, ezetimibe, fish oil,  metoprolol, rosuvastatin Heart healthy low-sodium diet Continue cardiac rehab/rehab exercises   Disposition: Follow-up with Dr. Jacinto Halim 6 months.    Thomasene Ripple. Harun Brumley NP-C     02/03/2023, 10:16 AM [facility] [address] Office [phone] Fax [phone]    I spent 14  minutes examining this patient, reviewing medications, and using patient centered shared decision making involving her cardiac care.   I spent greater than 20 minutes reviewing her past medical history,  medications, and prior cardiac tests.

## 2023-02-02 DIAGNOSIS — E1129 Type 2 diabetes mellitus with other diabetic kidney complication: Secondary | ICD-10-CM | POA: Diagnosis not present

## 2023-02-03 ENCOUNTER — Telehealth: Payer: Self-pay

## 2023-02-03 ENCOUNTER — Encounter: Payer: Self-pay | Admitting: General Practice

## 2023-02-03 ENCOUNTER — Ambulatory Visit: Payer: PPO | Attending: General Practice | Admitting: General Practice

## 2023-02-03 VITALS — BP 128/80 | HR 58 | Ht 67.0 in | Wt 197.2 lb

## 2023-02-03 DIAGNOSIS — E782 Mixed hyperlipidemia: Secondary | ICD-10-CM | POA: Diagnosis not present

## 2023-02-03 DIAGNOSIS — I10 Essential (primary) hypertension: Secondary | ICD-10-CM | POA: Diagnosis not present

## 2023-02-03 DIAGNOSIS — I25118 Atherosclerotic heart disease of native coronary artery with other forms of angina pectoris: Secondary | ICD-10-CM | POA: Diagnosis not present

## 2023-02-03 NOTE — Progress Notes (Signed)
BIOTRONIK LOOP RECORDER

## 2023-02-03 NOTE — Telephone Encounter (Addendum)
Open in error

## 2023-02-03 NOTE — Telephone Encounter (Signed)
Remote transmission received 02/03/23.

## 2023-02-03 NOTE — Telephone Encounter (Signed)
Attempted to contact patient about disconnected monitor. No answer, unable to leave VM.

## 2023-02-03 NOTE — Patient Instructions (Signed)
Medication Instructions:  The current medical regimen is effective;  continue present plan and medications as directed. Please refer to the Current Medication list given to you today.  *If you need a refill on your cardiac medications before your next appointment, please call your pharmacy*  Lab Work: BMET TODAY If you have labs (blood work) drawn today and your tests are completely normal, you will receive your results only by:  MyChart Message (if you have MyChart) OR  A paper copy in the mail If you have any lab test that is abnormal or we need to change your treatment, we will call you to review the results.  Other Instructions FLUID RESTRICTION <50 OUNCES DAILY TAKE AND LOG YOUR WEIGHT AND BLOOD PRESSURE DAILY CONTINUE TO FOLLOW LOW SALT DIET-ATTACHED  Follow-Up: At [facility], you and your health needs are our priority.  As part of our continuing mission to provide you with exceptional heart care, we have created designated Provider Care Teams.  These Care Teams include your primary Cardiologist (physician) and Advanced Practice Providers (APPs -  Physician Assistants and Nurse Practitioners) who all work together to provide you with the care you need, when you need it.  Your next appointment:   APRIL 2025   Provider:   Yates Decamp, MD -OR-  Edd Fabian, FNP         DASH Eating Plan  (LOW SALT DIET) DASH stands for Dietary Approaches to Stop Hypertension. The DASH eating plan is a healthy eating plan that has been shown to: Lower high blood pressure (hypertension). Reduce your risk for type 2 diabetes, heart disease, and stroke. Help with weight loss. What are tips for following this plan? Reading food labels Check food labels for the amount of salt (sodium) per serving. Choose foods with less than 5 percent of the Daily Value (DV) of sodium. In general, foods with less than 300 milligrams (mg) of sodium per serving fit into this eating plan. To find whole grains,  look for the word "whole" as the first word in the ingredient list. Shopping Buy products labeled as "low-sodium" or "no salt added." Buy fresh foods. Avoid canned foods and pre-made or frozen meals. Cooking Try not to add salt when you cook. Use salt-free seasonings or herbs instead of table salt or sea salt. Check with your health care provider or pharmacist before using salt substitutes. Do not fry foods. Cook foods in healthy ways, such as baking, boiling, grilling, roasting, or broiling. Cook using oils that are good for your heart. These include olive, canola, avocado, soybean, and sunflower oil. Meal planning  Eat a balanced diet. This should include: 4 or more servings of fruits and 4 or more servings of vegetables each day. Try to fill half of your plate with fruits and vegetables. 6-8 servings of whole grains each day. 6 or less servings of lean meat, poultry, or fish each day. 1 oz is 1 serving. A 3 oz (85 g) serving of meat is about the same size as the palm of your hand. One egg is 1 oz (28 g). 2-3 servings of low-fat dairy each day. One serving is 1 cup (237 mL). 1 serving of nuts, seeds, or beans 5 times each week. 2-3 servings of heart-healthy fats. Healthy fats called omega-3 fatty acids are found in foods such as walnuts, flaxseeds, fortified milks, and eggs. These fats are also found in cold-water fish, such as sardines, salmon, and mackerel. Limit how much you eat of: Canned or  prepackaged foods. Food that is high in trans fat, such as fried foods. Food that is high in saturated fat, such as fatty meat. Desserts and other sweets, sugary drinks, and other foods with added sugar. Full-fat dairy products. Do not salt foods before eating. Do not eat more than 4 egg yolks a week. Try to eat at least 2 vegetarian meals a week. Eat more home-cooked food and less restaurant, buffet, and fast food. Lifestyle When eating at a restaurant, ask if your food can be made with less  salt or no salt. If you drink alcohol: Limit how much you have to: 0-1 drink a day if you are male. 0-2 drinks a day if you are male. Know how much alcohol is in your drink. In the [place] one drink is one 12 oz bottle of beer (355 mL), one 5 oz glass of wine (148 mL), or one 1 oz glass of hard liquor (44 mL). General information Avoid eating more than 2,300 mg of salt a day. If you have hypertension, you may need to reduce your sodium intake to 1,500 mg a day. Work with your provider to stay at a healthy body weight or lose weight. Ask what the best weight range is for you. On most days of the week, get at least 30 minutes of exercise that causes your heart to beat faster. This may include walking, swimming, or biking. Work with your provider or dietitian to adjust your eating plan to meet your specific calorie needs. What foods should I eat? Fruits All fresh, dried, or frozen fruit. Canned fruits that are in their natural juice and do not have sugar added to them. Vegetables Fresh or frozen vegetables that are raw, steamed, roasted, or grilled. Low-sodium or reduced-sodium tomato and vegetable juice. Low-sodium or reduced-sodium tomato sauce and tomato paste. Low-sodium or reduced-sodium canned vegetables. Grains Whole-grain or whole-wheat bread. Whole-grain or whole-wheat pasta. Brown rice. Orpah Cobb. Bulgur. Whole-grain and low-sodium cereals. Pita bread. Low-fat, low-sodium crackers. Whole-wheat flour tortillas. Meats and other proteins Skinless chicken or [place]. Ground chicken or [place]. Pork with fat trimmed off. Fish and seafood. Egg whites. Dried beans, peas, or lentils. Unsalted nuts, nut butters, and seeds. Unsalted canned beans. Lean cuts of beef with fat trimmed off. Low-sodium, lean precooked or cured meat, such as sausages or meat loaves. Dairy Low-fat (1%) or fat-free (skim) milk. Reduced-fat, low-fat, or fat-free cheeses. Nonfat, low-sodium ricotta or cottage cheese.  Low-fat or nonfat yogurt. Low-fat, low-sodium cheese. Fats and oils Soft margarine without trans fats. Vegetable oil. Reduced-fat, low-fat, or light mayonnaise and salad dressings (reduced-sodium). Canola, safflower, olive, avocado, soybean, and sunflower oils. Avocado. Seasonings and condiments Herbs. Spices. Seasoning mixes without salt. Other foods Unsalted popcorn and pretzels. Fat-free sweets. The items listed above may not be all the foods and drinks you can have. Talk to a dietitian to learn more. What foods should I avoid? Fruits Canned fruit in a light or heavy syrup. Fried fruit. Fruit in cream or butter sauce. Vegetables Creamed or fried vegetables. Vegetables in a cheese sauce. Regular canned vegetables that are not marked as low-sodium or reduced-sodium. Regular canned tomato sauce and paste that are not marked as low-sodium or reduced-sodium. Regular tomato and vegetable juices that are not marked as low-sodium or reduced-sodium. Rosita Fire. Olives. Grains Baked goods made with fat, such as croissants, muffins, or some breads. Dry pasta or rice meal packs. Meats and other proteins Fatty cuts of meat. Ribs. Fried meat. Tomasa Blase. Bologna, salami, and other  precooked or cured meats, such as sausages or meat loaves, that are not lean and low in sodium. Fat from the back of a pig (fatback). Bratwurst. Salted nuts and seeds. Canned beans with added salt. Canned or smoked fish. Whole eggs or egg yolks. Chicken or [place] with skin. Dairy Whole or 2% milk, cream, and half-and-half. Whole or full-fat cream cheese. Whole-fat or sweetened yogurt. Full-fat cheese. Nondairy creamers. Whipped toppings. Processed cheese and cheese spreads. Fats and oils Butter. Stick margarine. Lard. Shortening. Ghee. Bacon fat. Tropical oils, such as coconut, palm kernel, or palm oil. Seasonings and condiments Onion salt, garlic salt, seasoned salt, table salt, and sea salt. Worcestershire sauce. Tartar sauce.  Barbecue sauce. Teriyaki sauce. Soy sauce, including reduced-sodium soy sauce. Steak sauce. Canned and packaged gravies. Fish sauce. Oyster sauce. Cocktail sauce. Store-bought horseradish. Ketchup. Mustard. Meat flavorings and tenderizers. Bouillon cubes. Hot sauces. Pre-made or packaged marinades. Pre-made or packaged taco seasonings. Relishes. Regular salad dressings. Other foods Salted popcorn and pretzels. The items listed above may not be all the foods and drinks you should avoid. Talk to a dietitian to learn more. Where to find more information National Heart, Lung, and Blood Institute (NHLBI): BuffaloDryCleaner.gl American Heart Association (AHA): heart.org Academy of Nutrition and Dietetics: eatright.org National Kidney Foundation (NKF): kidney.org This information is not intended to replace advice given to you by your health care provider. Make sure you discuss any questions you have with your health care provider. Document Revised: 03/13/2022 Document Reviewed: 03/13/2022 Elsevier Patient Education  2024 [employer].

## 2023-02-04 LAB — BASIC METABOLIC PANEL
BUN/Creatinine Ratio: 21 (ref 10–24)
BUN: 29 mg/dL — ABNORMAL HIGH (ref 8–27)
CO2: 24 mmol/L (ref 20–29)
Calcium: 9.6 mg/dL (ref 8.6–10.2)
Chloride: 96 mmol/L (ref 96–106)
Creatinine, Ser: 1.4 mg/dL — ABNORMAL HIGH (ref 0.76–1.27)
Glucose: 114 mg/dL — ABNORMAL HIGH (ref 70–99)
Potassium: 4.8 mmol/L (ref 3.5–5.2)
Sodium: 133 mmol/L — ABNORMAL LOW (ref 134–144)
eGFR: 51 mL/min/{1.73_m2} — ABNORMAL LOW (ref >59)

## 2023-02-04 MED ORDER — METOPROLOL SUCCINATE ER 50 MG PO TB24
50.0000 mg | ORAL_TABLET | Freq: Every day | ORAL | 3 refills | Status: DC
Start: 2023-02-04 — End: 2024-01-12

## 2023-02-04 NOTE — Telephone Encounter (Signed)
Notified patient and sent in Rx.

## 2023-02-16 ENCOUNTER — Encounter: Payer: Self-pay | Admitting: *Deleted

## 2023-02-16 ENCOUNTER — Ambulatory Visit (INDEPENDENT_AMBULATORY_CARE_PROVIDER_SITE_OTHER): Payer: PPO

## 2023-02-16 ENCOUNTER — Ambulatory Visit
Admission: EM | Admit: 2023-02-16 | Discharge: 2023-02-16 | Disposition: A | Discharge status: Home / Self Care | Payer: PPO | Attending: Family Medicine | Admitting: Family Medicine

## 2023-02-16 ENCOUNTER — Other Ambulatory Visit: Payer: Self-pay

## 2023-02-16 ENCOUNTER — Ambulatory Visit: Payer: PPO

## 2023-02-16 DIAGNOSIS — J22 Unspecified acute lower respiratory infection: Secondary | ICD-10-CM

## 2023-02-16 DIAGNOSIS — Z951 Presence of aortocoronary bypass graft: Secondary | ICD-10-CM | POA: Diagnosis not present

## 2023-02-16 DIAGNOSIS — R0602 Shortness of breath: Secondary | ICD-10-CM | POA: Diagnosis not present

## 2023-02-16 DIAGNOSIS — R058 Other specified cough: Secondary | ICD-10-CM | POA: Diagnosis not present

## 2023-02-16 MED ORDER — DOXYCYCLINE HYCLATE 100 MG PO CAPS: 100.0000 mg | ORAL_CAPSULE | Freq: Two times a day (BID) | ORAL | 0 refills | Status: DC

## 2023-02-16 MED ORDER — IPRATROPIUM-ALBUTEROL 0.5-2.5 (3) MG/3ML IN SOLN: 3.0000 mL | Freq: Four times a day (QID) | RESPIRATORY_TRACT | 0 refills | Status: AC | PRN

## 2023-02-16 MED ORDER — IPRATROPIUM-ALBUTEROL 0.5-2.5 (3) MG/3ML IN SOLN
3.0000 mL | Freq: Once | RESPIRATORY_TRACT | Status: AC
  Administered 2023-02-16: 3 mL via RESPIRATORY_TRACT

## 2023-02-16 MED ORDER — BENZONATATE 100 MG PO CAPS: 100.0000 mg | ORAL_CAPSULE | Freq: Three times a day (TID) | ORAL | 0 refills | Status: DC | PRN

## 2023-02-16 NOTE — Discharge Instructions (Addendum)
X-ray results have not been read by radiologist however I am concerned for a possible lower left lobe pneumonia.  Start doxycycline take twice daily for 10 days. I  have prescribed you Tessalon Perles 100 mg 3 times daily as needed for cough. I have also prescribed you DuoNebs to use every 6 hours as needed for shortness of breath or wheezing. At any point your symptoms get worse you develop fever or difficulty breathing go immediately to the emergency department.

## 2023-02-16 NOTE — ED Provider Notes (Signed)
[facility]    CSN: [id number] Arrival date & time: 02/16/23  1303      History   Chief Complaint Chief Complaint  Patient presents with   Cough    HPI [name] is a 77 y.o. male.  Patient presents with a 1 week history of progressively worsening productive cough,  shortness of breath, and chills.  Patient reports his cough is productive of greenish sputum.  He has had poor appetite and occultly resting due to shortness of breath.  Known sick contacts.  Patient is a non-smoker and has no history of COPD.  Patient's history is significant for CAD however he denies any chest pain or swelling. 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   Noted on EKG   Left axis deviation 05/2018   Noted on EKG   Lipoma 04/20/2018   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   Loop Biotronik Biomonitor III for syncope and collapse 04/09/2021 04/09/2021   LVH (left ventricular hypertrophy) 05/2018   Noted on EKG   Phimosis    Pneumonia    Syncope and collapse    Tuberculosis 1956   Wears glasses    Wheezing     Patient Active Problem List   Diagnosis Date Noted   Syncope 11/29/2022   Pre-syncope 11/28/2022   Elevated troponin 11/28/2022   CKD stage 3a, GFR 45-59 ml/min (HCC) 11/28/2022   CAD S/P percutaneous coronary angioplasty 11/28/2022   Coronary artery disease of bypass graft of native heart with stable angina pectoris  (HCC) 11/25/2022   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   Status post coronary artery stent placement    Post PTCA 12/24/2021   Hx of CABG    Syncope and collapse 04/09/2021   Loop Biotronik Biomonitor III for syncope and collapse 04/09/2021 04/09/2021   S/P lumbar laminectomy 07/13/2019   Chest pain 03/16/2016   Constipation 03/16/2016   HTN (hypertension)    Hyperlipidemia    Coronary artery disease of native artery of native heart with stable angina pectoris (HCC)    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    Past Surgical History:  Procedure Laterality Date   ANGIOPLASTY  2010   3 stents placed   BACK SURGERY     CARDIAC CATHETERIZATION     CATARACT EXTRACTION W/ INTRAOCULAR LENS  IMPLANT, BILATERAL  2012   bilateral   CIRCUMCISION N/A 08/30/2018   Procedure: CIRCUMCISION ADULT;  Surgeon: Ihor Gully, MD;  Location: [facility];  Service: Urology;  Laterality: N/A;   COLONOSCOPY  2013, 2008   CORONARY ANGIOPLASTY     CORONARY ARTERY BYPASS GRAFT  2006   x4   CORONARY ATHERECTOMY N/A 12/24/2021   Procedure: CORONARY ATHERECTOMY;  Surgeon: Yates Decamp, MD;  Location: [facility] INVASIVE CV LAB;  Service: Cardiovascular;  Laterality: N/A;   CORONARY BALLOON ANGIOPLASTY N/A 11/25/2022   Procedure: CORONARY BALLOON ANGIOPLASTY;  Surgeon: Yates Decamp, MD;  Location: [facility] INVASIVE CV LAB;  Service: Cardiovascular;  Laterality: N/A;   CORONARY STENT INTERVENTION N/A 12/24/2021   Procedure: CORONARY STENT INTERVENTION;  Surgeon: Yates Decamp, MD;  Location: [facility] INVASIVE CV LAB;  Service: Cardiovascular;  Laterality: N/A;   ESOPHAGOGASTRODUODENOSCOPY  10/2011   EYE SURGERY     KNEE ARTHROSCOPY  2001   LEFT HEART CATH AND CORS/GRAFTS ANGIOGRAPHY N/A 12/24/2021   Procedure: LEFT HEART CATH AND CORS/GRAFTS ANGIOGRAPHY;  Surgeon: Yates Decamp, MD;  Location: [facility] INVASIVE CV LAB;  Service: Cardiovascular;  Laterality: N/A;   LEFT HEART CATH AND CORS/GRAFTS  ANGIOGRAPHY N/A 11/25/2022   Procedure: LEFT HEART CATH AND CORS/GRAFTS ANGIOGRAPHY;  Surgeon: Yates Decamp, MD;  Location: [facility] INVASIVE CV LAB;  Service: Cardiovascular;  Laterality: N/A;   LUMBAR DISC SURGERY  1998   LUMBAR LAMINECTOMY/DECOMPRESSION MICRODISCECTOMY N/A 07/13/2019   Procedure: Laminectomy and Foraminotomy - Lumbar Two-Lumbar Three - Lumbar Three-Lumbar Four;  Surgeon: Tia Alert, MD;  Location[facility];  Service: Neurosurgery;  Laterality: N/A;  Laminectomy and Foraminotomy - Lumbar Two-Lumbar Three - Lumbar Three-Lumbar Four   ROTATOR CUFF REPAIR Left 05/24/2020   TENDON RECONSTRUCTION Right 10/27/2013   Procedure: RIGHT OPEN TRICEPS REPAIR;  Surgeon: Sheral Apley, MD;  Location: [facility];  Service: Orthopedics;  Laterality: Right;   TOTAL HIP ARTHROPLASTY Left 08/05/2022   Procedure: TOTAL HIP ARTHROPLASTY;  Surgeon: Teryl Lucy, MD;  Location: [facility] ORS;  Service: Orthopedics;  Laterality: Left;       Home Medications    Prior to Admission medications   Medication Sig Start Date End Date Taking? Authorizing Provider  acetaminophen (TYLENOL) 500 MG tablet Take 1,000 mg by mouth every 6 (six) hours as needed for moderate pain.    [provider]  aspirin 81 MG chewable tablet Chew 81 mg by mouth every evening.    [provider]  benzonatate (TESSALON) 100 MG capsule Take 1 capsule (100 mg total) by mouth 3 (three) times daily as needed for cough. 02/16/23  Yes Bing Neighbors, NP  Calcium Carbonate Antacid (TUMS ULTRA PO) Take 2 tablets by mouth daily as needed (acid reflux/indigestion.).    [provider]  clopidogrel (PLAVIX) 75 MG tablet Take 1 tablet (75 mg total) by mouth daily. 11/25/22   Yates Decamp, MD  Coenzyme Q10 (COQ10 PO) Take 300 mg by mouth 2 (two) times daily.    [provider]  Cyanocobalamin (VITAMIN B-12) 5000 MCG TBDP Take 5,000 mcg by mouth every evening. 12/07/20   [provider]   doxycycline (VIBRAMYCIN) 100 MG capsule Take 1 capsule (100 mg total) by mouth 2 (two) times daily. 02/16/23  Yes Bing Neighbors, NP  ezetimibe (ZETIA) 10 MG tablet TAKE 1/2 TABLET BY MOUTH EVERY EVENING 08/21/21   Yates Decamp, MD  ferrous sulfate 325 (65 FE) MG tablet Take 1 tablet (325 mg total) by mouth daily with breakfast. 12/03/22   Regalado, Belkys A, MD  fish oil-omega-3 fatty acids 1000 MG capsule Take 1 g by mouth in the morning and at bedtime.    [provider]  fluconazole (DIFLUCAN) 200 MG tablet Take 200 mg by mouth every Saturday. Patient not taking: Reported on 02/03/2023 05/14/22   [provider]  fluticasone (FLONASE) 50 MCG/ACT nasal spray Place 1 spray into both nostrils daily. 12/02/22   Regalado, Prentiss Bells, MD  folic acid (FOLVITE) 1 MG tablet Take 1 tablet (1 mg total) by mouth daily. 12/02/22   Regalado, Belkys A, MD  hydrochlorothiazide (MICROZIDE) 12.5 MG capsule Take 1 capsule (12.5 mg total) by mouth daily. 01/07/23 04/07/23  Ronney Asters, NP  hydrocortisone (CORTEF) 10 MG tablet Take 1 tablet (10 mg total) by mouth in the morning. 12/02/22   Regalado, Belkys A, MD  hydrocortisone (CORTEF) 5 MG tablet Take 1 tablet (5 mg total) by mouth daily in the afternoon. 12/02/22   Regalado, Belkys A, MD  ipratropium-albuterol (DUONEB) 0.5-2.5 (3) MG/3ML SOLN Take 3 mLs by nebulization every 6 (six) hours as needed (wheezing or shortness of breath). 02/16/23  Yes Bing Neighbors, NP  metoprolol succinate (TOPROL-XL) 50 MG 24 hr tablet Take 1 tablet (50 mg total) by mouth daily. Take with or immediately following a meal. 02/04/23 05/05/23  Camnitz, Andree Coss, MD  midodrine (PROAMATINE) 2.5 MG tablet Take 1 tablet (2.5 mg total) by mouth 3 (three) times daily with meals as needed (SBP less than 100). 12/02/22   Regalado, Jon Billings A, MD  Multiple Vitamin (MULTIVITAMIN) tablet Take 1 tablet by mouth in the morning. Seniors    [provider]  nitroGLYCERIN  (NITROSTAT) 0.4 MG SL tablet DISSOLVE 1 TABLET UNDER THE TONGUE AS NEEDED FOR CHEST PAIN EVERY 5 MINUTES UP TO 3 TIMES. IF NO RELIEF CALL 911. 01/28/23   Yates Decamp, MD  pantoprazole (PROTONIX) 40 MG tablet Take 1 tablet (40 mg total) by mouth daily. 03/17/16   Elgergawy, Leana Roe, MD  Polyethyl Glycol-Propyl Glycol (SYSTANE) 0.4-0.3 % GEL ophthalmic gel Place 1 application into both eyes at bedtime.    [provider]  Propylene Glycol (SYSTANE BALANCE) 0.6 % SOLN Place 1 drop into both eyes 3 (three) times daily as needed (dry/irritated eyes.).    [provider]  pyridoxine (B-6) 100 MG tablet Take 100 mg by mouth every evening.    [provider]  ranolazine (RANEXA) 1000 MG SR tablet Take 1 tablet (1,000 mg total) by mouth daily. 12/17/22   Yates Decamp, MD  rosuvastatin (CRESTOR) 20 MG tablet TAKE 1 TABLET BY MOUTH EVERY DAY AT BEDTIME 12/26/22   Yates Decamp, MD  traMADol (ULTRAM) 50 MG tablet Take 50 mg by mouth 3 (three) times daily as needed for moderate pain. 10/20/22   [provider]  vitamin C (ASCORBIC ACID) 500 MG tablet Take 500 mg by mouth daily.    [provider]    Family History Family History  Problem Relation Age of Onset   Colon cancer Mother 30   Hypertension Mother    Colon cancer Father 34   Heart attack Brother    Heart failure Brother    Stomach cancer Neg Hx     Social History Social History   Tobacco Use   Smoking status: Never   Smokeless tobacco: Never  Vaping Use   Vaping status: Never Used  Substance Use Topics   Alcohol use: No   Drug use: No     Allergies   Codeine, Guaifenesin & derivatives, Terbinafine and related, Iodinated contrast media, and Penicillins   Review of Systems Review of Systems  Respiratory:  Positive for cough.      Physical Exam Triage Vital Signs ED Triage Vitals  Encounter Vitals Group     BP 02/16/23 1505 127/65     Systolic BP Percentile --      Diastolic BP  Percentile --  Pulse Rate 02/16/23 1505 (!) 58     Resp 02/16/23 1505 18     Temp 02/16/23 1505 98 F (36.7 C)     Temp Source 02/16/23 1505 Oral     SpO2 02/16/23 1505 94 %     Weight --      Height --      Head Circumference --      Peak Flow --      Pain Score 02/16/23 1503 4     Pain Loc --      Pain Education --      Exclude from Growth Chart --    No data found.  Updated Vital Signs BP 127/65 (BP Location: Left Arm)   Pulse (!) 58   Temp 98 F (36.7 C) (Oral)   Resp 18   SpO2 94%   Visual Acuity Right Eye Distance:   Left Eye Distance:   Bilateral Distance:    Right Eye Near:   Left Eye Near:    Bilateral Near:     Physical Exam Vitals reviewed.  Constitutional:      Appearance: He is ill-appearing.  HENT:     Head: Normocephalic and atraumatic.     Nose: Congestion present.  Eyes:     Extraocular Movements: Extraocular movements intact.     Conjunctiva/sclera: Conjunctivae normal.     Pupils: Pupils are equal, round, and reactive to light.  Cardiovascular:     Rate and Rhythm: Regular rhythm. Bradycardia present.  Pulmonary:     Breath sounds: Wheezing and rhonchi present.  Musculoskeletal:     Cervical back: Normal range of motion.  Lymphadenopathy:     Cervical: No cervical adenopathy.  Skin:    General: Skin is warm and dry.  Neurological:     General: No focal deficit present.     Mental Status: He is alert.      UC Treatments / Results  Labs (all labs ordered are listed, but only abnormal results are displayed) Labs Reviewed - No data to display  EKG   Radiology No results found.  Procedures Procedures (including critical care time)  Medications Ordered in UC Medications  ipratropium-albuterol (DUONEB) 0.5-2.5 (3) MG/3ML nebulizer solution 3 mL (3 mLs Nebulization Given 02/16/23 1532)    Initial Impression / Assessment and Plan / UC Course  I have reviewed the triage vital signs and the nursing notes.  Pertinent labs  & imaging results that were available during my care of the patient were reviewed by me and considered in my medical decision making (see chart for details).    Covering for lower respiratory infection with doxycycline twice daily for total of 10 days.  Patient received a DuoNeb while here in clinic and reported in improved work of breathing.  Prescribed home nebulizer with DuoNeb treatments every 6 hours as needed for shortness of breath and/or wheezing.  Tessalon Perles 3 times daily as needed for cough.  Strict ED precautions given if symptoms worsen or do not improve.  Chest x-ray pending radiology read.  Final diagnoses:  Lower respiratory infection (e.g., bronchitis, pneumonia, pneumonitis, pulmonitis)     Discharge Instructions      X-ray results have not been read by radiologist however I am concerned for a possible lower left lobe pneumonia.  Start doxycycline take twice daily for 10 days. I  have prescribed you Tessalon Perles 100 mg 3 times daily as needed for cough. I have also prescribed you DuoNebs to use every 6 hours as  needed for shortness of breath or wheezing. At any point your symptoms get worse you develop fever or difficulty breathing go immediately to the emergency department.   ED Prescriptions     Medication Sig Dispense Auth. Provider   doxycycline (VIBRAMYCIN) 100 MG capsule Take 1 capsule (100 mg total) by mouth 2 (two) times daily. 20 capsule Bing Neighbors, NP   benzonatate (TESSALON) 100 MG capsule Take 1 capsule (100 mg total) by mouth 3 (three) times daily as needed for cough. 40 capsule Bing Neighbors, NP   ipratropium-albuterol (DUONEB) 0.5-2.5 (3) MG/3ML SOLN Take 3 mLs by nebulization every 6 (six) hours as needed (wheezing or shortness of breath). 75 mL Bing Neighbors, NP      PDMP not reviewed this encounter.   Bing Neighbors, NP 02/16/23 [phone]

## 2023-02-16 NOTE — ED Triage Notes (Signed)
Pt reports cough over a week, productive for green- brown secretions. Denies known fever, States "cough is getting worse". C/o soreness with coughing and headache

## 2023-02-17 ENCOUNTER — Encounter (INDEPENDENT_AMBULATORY_CARE_PROVIDER_SITE_OTHER): Payer: PPO | Admitting: Ophthalmology

## 2023-02-23 ENCOUNTER — Encounter (INDEPENDENT_AMBULATORY_CARE_PROVIDER_SITE_OTHER): Payer: PPO | Admitting: Ophthalmology

## 2023-02-23 DIAGNOSIS — M51372 Other intervertebral disc degeneration, lumbosacral region with discogenic back pain and lower extremity pain: Secondary | ICD-10-CM | POA: Diagnosis not present

## 2023-02-23 DIAGNOSIS — M9904 Segmental and somatic dysfunction of sacral region: Secondary | ICD-10-CM | POA: Diagnosis not present

## 2023-02-23 DIAGNOSIS — M51362 Other intervertebral disc degeneration, lumbar region with discogenic back pain and lower extremity pain: Secondary | ICD-10-CM | POA: Diagnosis not present

## 2023-02-23 DIAGNOSIS — M9901 Segmental and somatic dysfunction of cervical region: Secondary | ICD-10-CM | POA: Diagnosis not present

## 2023-02-23 DIAGNOSIS — M5031 Other cervical disc degeneration,  high cervical region: Secondary | ICD-10-CM | POA: Diagnosis not present

## 2023-02-23 DIAGNOSIS — M5417 Radiculopathy, lumbosacral region: Secondary | ICD-10-CM | POA: Diagnosis not present

## 2023-02-23 DIAGNOSIS — M9903 Segmental and somatic dysfunction of lumbar region: Secondary | ICD-10-CM | POA: Diagnosis not present

## 2023-02-23 DIAGNOSIS — M4312 Spondylolisthesis, cervical region: Secondary | ICD-10-CM | POA: Diagnosis not present

## 2023-02-23 DIAGNOSIS — M9902 Segmental and somatic dysfunction of thoracic region: Secondary | ICD-10-CM | POA: Diagnosis not present

## 2023-02-23 DIAGNOSIS — M9905 Segmental and somatic dysfunction of pelvic region: Secondary | ICD-10-CM | POA: Diagnosis not present

## 2023-02-23 DIAGNOSIS — M50322 Other cervical disc degeneration at C5-C6 level: Secondary | ICD-10-CM | POA: Diagnosis not present

## 2023-02-24 DIAGNOSIS — M9904 Segmental and somatic dysfunction of sacral region: Secondary | ICD-10-CM | POA: Diagnosis not present

## 2023-02-24 DIAGNOSIS — M5417 Radiculopathy, lumbosacral region: Secondary | ICD-10-CM | POA: Diagnosis not present

## 2023-02-24 DIAGNOSIS — M5031 Other cervical disc degeneration,  high cervical region: Secondary | ICD-10-CM | POA: Diagnosis not present

## 2023-02-24 DIAGNOSIS — M9902 Segmental and somatic dysfunction of thoracic region: Secondary | ICD-10-CM | POA: Diagnosis not present

## 2023-02-24 DIAGNOSIS — M51372 Other intervertebral disc degeneration, lumbosacral region with discogenic back pain and lower extremity pain: Secondary | ICD-10-CM | POA: Diagnosis not present

## 2023-02-24 DIAGNOSIS — M4312 Spondylolisthesis, cervical region: Secondary | ICD-10-CM | POA: Diagnosis not present

## 2023-02-24 DIAGNOSIS — M51362 Other intervertebral disc degeneration, lumbar region with discogenic back pain and lower extremity pain: Secondary | ICD-10-CM | POA: Diagnosis not present

## 2023-02-24 DIAGNOSIS — M9905 Segmental and somatic dysfunction of pelvic region: Secondary | ICD-10-CM | POA: Diagnosis not present

## 2023-02-24 DIAGNOSIS — M9903 Segmental and somatic dysfunction of lumbar region: Secondary | ICD-10-CM | POA: Diagnosis not present

## 2023-02-24 DIAGNOSIS — M50322 Other cervical disc degeneration at C5-C6 level: Secondary | ICD-10-CM | POA: Diagnosis not present

## 2023-02-24 DIAGNOSIS — M9901 Segmental and somatic dysfunction of cervical region: Secondary | ICD-10-CM | POA: Diagnosis not present

## 2023-02-25 ENCOUNTER — Encounter (INDEPENDENT_AMBULATORY_CARE_PROVIDER_SITE_OTHER): Payer: PPO | Admitting: Ophthalmology

## 2023-02-25 DIAGNOSIS — I10 Essential (primary) hypertension: Secondary | ICD-10-CM | POA: Diagnosis not present

## 2023-02-25 DIAGNOSIS — H35033 Hypertensive retinopathy, bilateral: Secondary | ICD-10-CM | POA: Diagnosis not present

## 2023-02-25 DIAGNOSIS — H43813 Vitreous degeneration, bilateral: Secondary | ICD-10-CM | POA: Diagnosis not present

## 2023-02-25 DIAGNOSIS — H34832 Tributary (branch) retinal vein occlusion, left eye, with macular edema: Secondary | ICD-10-CM | POA: Diagnosis not present

## 2023-02-26 DIAGNOSIS — M9905 Segmental and somatic dysfunction of pelvic region: Secondary | ICD-10-CM | POA: Diagnosis not present

## 2023-02-26 DIAGNOSIS — M9902 Segmental and somatic dysfunction of thoracic region: Secondary | ICD-10-CM | POA: Diagnosis not present

## 2023-02-26 DIAGNOSIS — M50322 Other cervical disc degeneration at C5-C6 level: Secondary | ICD-10-CM | POA: Diagnosis not present

## 2023-02-26 DIAGNOSIS — M4312 Spondylolisthesis, cervical region: Secondary | ICD-10-CM | POA: Diagnosis not present

## 2023-02-26 DIAGNOSIS — M51362 Other intervertebral disc degeneration, lumbar region with discogenic back pain and lower extremity pain: Secondary | ICD-10-CM | POA: Diagnosis not present

## 2023-02-26 DIAGNOSIS — M9903 Segmental and somatic dysfunction of lumbar region: Secondary | ICD-10-CM | POA: Diagnosis not present

## 2023-02-26 DIAGNOSIS — M5031 Other cervical disc degeneration,  high cervical region: Secondary | ICD-10-CM | POA: Diagnosis not present

## 2023-02-26 DIAGNOSIS — M5417 Radiculopathy, lumbosacral region: Secondary | ICD-10-CM | POA: Diagnosis not present

## 2023-02-26 DIAGNOSIS — M9901 Segmental and somatic dysfunction of cervical region: Secondary | ICD-10-CM | POA: Diagnosis not present

## 2023-02-26 DIAGNOSIS — M51372 Other intervertebral disc degeneration, lumbosacral region with discogenic back pain and lower extremity pain: Secondary | ICD-10-CM | POA: Diagnosis not present

## 2023-02-26 DIAGNOSIS — M9904 Segmental and somatic dysfunction of sacral region: Secondary | ICD-10-CM | POA: Diagnosis not present

## 2023-03-02 DIAGNOSIS — M9902 Segmental and somatic dysfunction of thoracic region: Secondary | ICD-10-CM | POA: Diagnosis not present

## 2023-03-02 DIAGNOSIS — M9904 Segmental and somatic dysfunction of sacral region: Secondary | ICD-10-CM | POA: Diagnosis not present

## 2023-03-02 DIAGNOSIS — M51372 Other intervertebral disc degeneration, lumbosacral region with discogenic back pain and lower extremity pain: Secondary | ICD-10-CM | POA: Diagnosis not present

## 2023-03-02 DIAGNOSIS — M9903 Segmental and somatic dysfunction of lumbar region: Secondary | ICD-10-CM | POA: Diagnosis not present

## 2023-03-02 DIAGNOSIS — M5031 Other cervical disc degeneration,  high cervical region: Secondary | ICD-10-CM | POA: Diagnosis not present

## 2023-03-02 DIAGNOSIS — M4312 Spondylolisthesis, cervical region: Secondary | ICD-10-CM | POA: Diagnosis not present

## 2023-03-02 DIAGNOSIS — M9905 Segmental and somatic dysfunction of pelvic region: Secondary | ICD-10-CM | POA: Diagnosis not present

## 2023-03-02 DIAGNOSIS — M51362 Other intervertebral disc degeneration, lumbar region with discogenic back pain and lower extremity pain: Secondary | ICD-10-CM | POA: Diagnosis not present

## 2023-03-02 DIAGNOSIS — M50322 Other cervical disc degeneration at C5-C6 level: Secondary | ICD-10-CM | POA: Diagnosis not present

## 2023-03-02 DIAGNOSIS — M5417 Radiculopathy, lumbosacral region: Secondary | ICD-10-CM | POA: Diagnosis not present

## 2023-03-02 DIAGNOSIS — M9901 Segmental and somatic dysfunction of cervical region: Secondary | ICD-10-CM | POA: Diagnosis not present

## 2023-03-03 DIAGNOSIS — M5417 Radiculopathy, lumbosacral region: Secondary | ICD-10-CM | POA: Diagnosis not present

## 2023-03-03 DIAGNOSIS — M5031 Other cervical disc degeneration,  high cervical region: Secondary | ICD-10-CM | POA: Diagnosis not present

## 2023-03-03 DIAGNOSIS — M9901 Segmental and somatic dysfunction of cervical region: Secondary | ICD-10-CM | POA: Diagnosis not present

## 2023-03-03 DIAGNOSIS — M51362 Other intervertebral disc degeneration, lumbar region with discogenic back pain and lower extremity pain: Secondary | ICD-10-CM | POA: Diagnosis not present

## 2023-03-03 DIAGNOSIS — M4312 Spondylolisthesis, cervical region: Secondary | ICD-10-CM | POA: Diagnosis not present

## 2023-03-03 DIAGNOSIS — M50322 Other cervical disc degeneration at C5-C6 level: Secondary | ICD-10-CM | POA: Diagnosis not present

## 2023-03-03 DIAGNOSIS — M9903 Segmental and somatic dysfunction of lumbar region: Secondary | ICD-10-CM | POA: Diagnosis not present

## 2023-03-03 DIAGNOSIS — M9905 Segmental and somatic dysfunction of pelvic region: Secondary | ICD-10-CM | POA: Diagnosis not present

## 2023-03-03 DIAGNOSIS — M9904 Segmental and somatic dysfunction of sacral region: Secondary | ICD-10-CM | POA: Diagnosis not present

## 2023-03-03 DIAGNOSIS — M9902 Segmental and somatic dysfunction of thoracic region: Secondary | ICD-10-CM | POA: Diagnosis not present

## 2023-03-03 DIAGNOSIS — M51372 Other intervertebral disc degeneration, lumbosacral region with discogenic back pain and lower extremity pain: Secondary | ICD-10-CM | POA: Diagnosis not present

## 2023-03-05 DIAGNOSIS — M9902 Segmental and somatic dysfunction of thoracic region: Secondary | ICD-10-CM | POA: Diagnosis not present

## 2023-03-05 DIAGNOSIS — M5417 Radiculopathy, lumbosacral region: Secondary | ICD-10-CM | POA: Diagnosis not present

## 2023-03-05 DIAGNOSIS — M5031 Other cervical disc degeneration,  high cervical region: Secondary | ICD-10-CM | POA: Diagnosis not present

## 2023-03-05 DIAGNOSIS — M9904 Segmental and somatic dysfunction of sacral region: Secondary | ICD-10-CM | POA: Diagnosis not present

## 2023-03-05 DIAGNOSIS — M4312 Spondylolisthesis, cervical region: Secondary | ICD-10-CM | POA: Diagnosis not present

## 2023-03-05 DIAGNOSIS — M50322 Other cervical disc degeneration at C5-C6 level: Secondary | ICD-10-CM | POA: Diagnosis not present

## 2023-03-05 DIAGNOSIS — M9905 Segmental and somatic dysfunction of pelvic region: Secondary | ICD-10-CM | POA: Diagnosis not present

## 2023-03-05 DIAGNOSIS — M51372 Other intervertebral disc degeneration, lumbosacral region with discogenic back pain and lower extremity pain: Secondary | ICD-10-CM | POA: Diagnosis not present

## 2023-03-05 DIAGNOSIS — M9901 Segmental and somatic dysfunction of cervical region: Secondary | ICD-10-CM | POA: Diagnosis not present

## 2023-03-05 DIAGNOSIS — M51362 Other intervertebral disc degeneration, lumbar region with discogenic back pain and lower extremity pain: Secondary | ICD-10-CM | POA: Diagnosis not present

## 2023-03-05 DIAGNOSIS — M9903 Segmental and somatic dysfunction of lumbar region: Secondary | ICD-10-CM | POA: Diagnosis not present

## 2023-03-06 ENCOUNTER — Telehealth: Payer: Self-pay | Admitting: Cardiology

## 2023-03-06 DIAGNOSIS — I25118 Atherosclerotic heart disease of native coronary artery with other forms of angina pectoris: Secondary | ICD-10-CM

## 2023-03-06 MED ORDER — RANOLAZINE ER 1000 MG PO TB12: 1000.0000 mg | ORAL_TABLET | Freq: Every day | ORAL | 3 refills | Status: DC

## 2023-03-06 NOTE — Telephone Encounter (Signed)
Pt's medication was sent to pt's pharmacy as requested. Confirmation received.  °

## 2023-03-06 NOTE — Telephone Encounter (Signed)
*  STAT* If patient is at the pharmacy, call can be transferred to refill team.   1. Which medications need to be refilled? (please list name of each medication and dose if known) ranolazine (RANEXA) 1000 MG SR tablet   2. Which pharmacy/location (including street and city if local pharmacy) is medication to be sent to?  [id number] - [place], [place] - [address]      3. Do they need a 30 day or 90 day supply? 90 day.

## 2023-03-09 DIAGNOSIS — M51372 Other intervertebral disc degeneration, lumbosacral region with discogenic back pain and lower extremity pain: Secondary | ICD-10-CM | POA: Diagnosis not present

## 2023-03-09 DIAGNOSIS — M51362 Other intervertebral disc degeneration, lumbar region with discogenic back pain and lower extremity pain: Secondary | ICD-10-CM | POA: Diagnosis not present

## 2023-03-09 DIAGNOSIS — M5417 Radiculopathy, lumbosacral region: Secondary | ICD-10-CM | POA: Diagnosis not present

## 2023-03-09 DIAGNOSIS — M9903 Segmental and somatic dysfunction of lumbar region: Secondary | ICD-10-CM | POA: Diagnosis not present

## 2023-03-09 DIAGNOSIS — M9902 Segmental and somatic dysfunction of thoracic region: Secondary | ICD-10-CM | POA: Diagnosis not present

## 2023-03-09 DIAGNOSIS — M5031 Other cervical disc degeneration,  high cervical region: Secondary | ICD-10-CM | POA: Diagnosis not present

## 2023-03-09 DIAGNOSIS — M9905 Segmental and somatic dysfunction of pelvic region: Secondary | ICD-10-CM | POA: Diagnosis not present

## 2023-03-09 DIAGNOSIS — M50322 Other cervical disc degeneration at C5-C6 level: Secondary | ICD-10-CM | POA: Diagnosis not present

## 2023-03-09 DIAGNOSIS — M9901 Segmental and somatic dysfunction of cervical region: Secondary | ICD-10-CM | POA: Diagnosis not present

## 2023-03-09 DIAGNOSIS — M9904 Segmental and somatic dysfunction of sacral region: Secondary | ICD-10-CM | POA: Diagnosis not present

## 2023-03-09 DIAGNOSIS — M4312 Spondylolisthesis, cervical region: Secondary | ICD-10-CM | POA: Diagnosis not present

## 2023-03-10 DIAGNOSIS — M4312 Spondylolisthesis, cervical region: Secondary | ICD-10-CM | POA: Diagnosis not present

## 2023-03-10 DIAGNOSIS — M5031 Other cervical disc degeneration,  high cervical region: Secondary | ICD-10-CM | POA: Diagnosis not present

## 2023-03-10 DIAGNOSIS — M9901 Segmental and somatic dysfunction of cervical region: Secondary | ICD-10-CM | POA: Diagnosis not present

## 2023-03-10 DIAGNOSIS — M9902 Segmental and somatic dysfunction of thoracic region: Secondary | ICD-10-CM | POA: Diagnosis not present

## 2023-03-10 DIAGNOSIS — M51372 Other intervertebral disc degeneration, lumbosacral region with discogenic back pain and lower extremity pain: Secondary | ICD-10-CM | POA: Diagnosis not present

## 2023-03-10 DIAGNOSIS — M5417 Radiculopathy, lumbosacral region: Secondary | ICD-10-CM | POA: Diagnosis not present

## 2023-03-10 DIAGNOSIS — M50322 Other cervical disc degeneration at C5-C6 level: Secondary | ICD-10-CM | POA: Diagnosis not present

## 2023-03-10 DIAGNOSIS — M51362 Other intervertebral disc degeneration, lumbar region with discogenic back pain and lower extremity pain: Secondary | ICD-10-CM | POA: Diagnosis not present

## 2023-03-10 DIAGNOSIS — M9905 Segmental and somatic dysfunction of pelvic region: Secondary | ICD-10-CM | POA: Diagnosis not present

## 2023-03-10 DIAGNOSIS — M9904 Segmental and somatic dysfunction of sacral region: Secondary | ICD-10-CM | POA: Diagnosis not present

## 2023-03-10 DIAGNOSIS — M9903 Segmental and somatic dysfunction of lumbar region: Secondary | ICD-10-CM | POA: Diagnosis not present

## 2023-03-12 DIAGNOSIS — M9904 Segmental and somatic dysfunction of sacral region: Secondary | ICD-10-CM | POA: Diagnosis not present

## 2023-03-12 DIAGNOSIS — M50322 Other cervical disc degeneration at C5-C6 level: Secondary | ICD-10-CM | POA: Diagnosis not present

## 2023-03-12 DIAGNOSIS — M51372 Other intervertebral disc degeneration, lumbosacral region with discogenic back pain and lower extremity pain: Secondary | ICD-10-CM | POA: Diagnosis not present

## 2023-03-12 DIAGNOSIS — M9903 Segmental and somatic dysfunction of lumbar region: Secondary | ICD-10-CM | POA: Diagnosis not present

## 2023-03-12 DIAGNOSIS — M9905 Segmental and somatic dysfunction of pelvic region: Secondary | ICD-10-CM | POA: Diagnosis not present

## 2023-03-12 DIAGNOSIS — M9902 Segmental and somatic dysfunction of thoracic region: Secondary | ICD-10-CM | POA: Diagnosis not present

## 2023-03-12 DIAGNOSIS — M51362 Other intervertebral disc degeneration, lumbar region with discogenic back pain and lower extremity pain: Secondary | ICD-10-CM | POA: Diagnosis not present

## 2023-03-12 DIAGNOSIS — M9901 Segmental and somatic dysfunction of cervical region: Secondary | ICD-10-CM | POA: Diagnosis not present

## 2023-03-12 DIAGNOSIS — M5417 Radiculopathy, lumbosacral region: Secondary | ICD-10-CM | POA: Diagnosis not present

## 2023-03-12 DIAGNOSIS — M4312 Spondylolisthesis, cervical region: Secondary | ICD-10-CM | POA: Diagnosis not present

## 2023-03-12 DIAGNOSIS — M5031 Other cervical disc degeneration,  high cervical region: Secondary | ICD-10-CM | POA: Diagnosis not present

## 2023-03-16 DIAGNOSIS — M51362 Other intervertebral disc degeneration, lumbar region with discogenic back pain and lower extremity pain: Secondary | ICD-10-CM | POA: Diagnosis not present

## 2023-03-16 DIAGNOSIS — M50322 Other cervical disc degeneration at C5-C6 level: Secondary | ICD-10-CM | POA: Diagnosis not present

## 2023-03-16 DIAGNOSIS — M4312 Spondylolisthesis, cervical region: Secondary | ICD-10-CM | POA: Diagnosis not present

## 2023-03-16 DIAGNOSIS — M5417 Radiculopathy, lumbosacral region: Secondary | ICD-10-CM | POA: Diagnosis not present

## 2023-03-16 DIAGNOSIS — M51372 Other intervertebral disc degeneration, lumbosacral region with discogenic back pain and lower extremity pain: Secondary | ICD-10-CM | POA: Diagnosis not present

## 2023-03-16 DIAGNOSIS — M9902 Segmental and somatic dysfunction of thoracic region: Secondary | ICD-10-CM | POA: Diagnosis not present

## 2023-03-16 DIAGNOSIS — M9905 Segmental and somatic dysfunction of pelvic region: Secondary | ICD-10-CM | POA: Diagnosis not present

## 2023-03-16 DIAGNOSIS — M5031 Other cervical disc degeneration,  high cervical region: Secondary | ICD-10-CM | POA: Diagnosis not present

## 2023-03-16 DIAGNOSIS — M9904 Segmental and somatic dysfunction of sacral region: Secondary | ICD-10-CM | POA: Diagnosis not present

## 2023-03-16 DIAGNOSIS — M9901 Segmental and somatic dysfunction of cervical region: Secondary | ICD-10-CM | POA: Diagnosis not present

## 2023-03-16 DIAGNOSIS — M9903 Segmental and somatic dysfunction of lumbar region: Secondary | ICD-10-CM | POA: Diagnosis not present

## 2023-03-17 DIAGNOSIS — H353131 Nonexudative age-related macular degeneration, bilateral, early dry stage: Secondary | ICD-10-CM | POA: Diagnosis not present

## 2023-03-17 DIAGNOSIS — H04123 Dry eye syndrome of bilateral lacrimal glands: Secondary | ICD-10-CM | POA: Diagnosis not present

## 2023-03-17 DIAGNOSIS — H34832 Tributary (branch) retinal vein occlusion, left eye, with macular edema: Secondary | ICD-10-CM | POA: Diagnosis not present

## 2023-03-17 DIAGNOSIS — H40023 Open angle with borderline findings, high risk, bilateral: Secondary | ICD-10-CM | POA: Diagnosis not present

## 2023-03-17 DIAGNOSIS — H0102B Squamous blepharitis left eye, upper and lower eyelids: Secondary | ICD-10-CM | POA: Diagnosis not present

## 2023-03-17 DIAGNOSIS — H0102A Squamous blepharitis right eye, upper and lower eyelids: Secondary | ICD-10-CM | POA: Diagnosis not present

## 2023-03-17 DIAGNOSIS — Z961 Presence of intraocular lens: Secondary | ICD-10-CM | POA: Diagnosis not present

## 2023-03-17 DIAGNOSIS — H11823 Conjunctivochalasis, bilateral: Secondary | ICD-10-CM | POA: Diagnosis not present

## 2023-03-18 DIAGNOSIS — M51362 Other intervertebral disc degeneration, lumbar region with discogenic back pain and lower extremity pain: Secondary | ICD-10-CM | POA: Diagnosis not present

## 2023-03-18 DIAGNOSIS — M9902 Segmental and somatic dysfunction of thoracic region: Secondary | ICD-10-CM | POA: Diagnosis not present

## 2023-03-18 DIAGNOSIS — M9904 Segmental and somatic dysfunction of sacral region: Secondary | ICD-10-CM | POA: Diagnosis not present

## 2023-03-18 DIAGNOSIS — M9903 Segmental and somatic dysfunction of lumbar region: Secondary | ICD-10-CM | POA: Diagnosis not present

## 2023-03-18 DIAGNOSIS — M4312 Spondylolisthesis, cervical region: Secondary | ICD-10-CM | POA: Diagnosis not present

## 2023-03-18 DIAGNOSIS — M5031 Other cervical disc degeneration,  high cervical region: Secondary | ICD-10-CM | POA: Diagnosis not present

## 2023-03-18 DIAGNOSIS — M9901 Segmental and somatic dysfunction of cervical region: Secondary | ICD-10-CM | POA: Diagnosis not present

## 2023-03-18 DIAGNOSIS — M9905 Segmental and somatic dysfunction of pelvic region: Secondary | ICD-10-CM | POA: Diagnosis not present

## 2023-03-18 DIAGNOSIS — M50322 Other cervical disc degeneration at C5-C6 level: Secondary | ICD-10-CM | POA: Diagnosis not present

## 2023-03-18 DIAGNOSIS — M51372 Other intervertebral disc degeneration, lumbosacral region with discogenic back pain and lower extremity pain: Secondary | ICD-10-CM | POA: Diagnosis not present

## 2023-03-18 DIAGNOSIS — M5417 Radiculopathy, lumbosacral region: Secondary | ICD-10-CM | POA: Diagnosis not present

## 2023-03-19 DIAGNOSIS — M9902 Segmental and somatic dysfunction of thoracic region: Secondary | ICD-10-CM | POA: Diagnosis not present

## 2023-03-19 DIAGNOSIS — M51372 Other intervertebral disc degeneration, lumbosacral region with discogenic back pain and lower extremity pain: Secondary | ICD-10-CM | POA: Diagnosis not present

## 2023-03-19 DIAGNOSIS — M50322 Other cervical disc degeneration at C5-C6 level: Secondary | ICD-10-CM | POA: Diagnosis not present

## 2023-03-19 DIAGNOSIS — M5417 Radiculopathy, lumbosacral region: Secondary | ICD-10-CM | POA: Diagnosis not present

## 2023-03-19 DIAGNOSIS — M9903 Segmental and somatic dysfunction of lumbar region: Secondary | ICD-10-CM | POA: Diagnosis not present

## 2023-03-19 DIAGNOSIS — M9904 Segmental and somatic dysfunction of sacral region: Secondary | ICD-10-CM | POA: Diagnosis not present

## 2023-03-19 DIAGNOSIS — M9901 Segmental and somatic dysfunction of cervical region: Secondary | ICD-10-CM | POA: Diagnosis not present

## 2023-03-19 DIAGNOSIS — M4312 Spondylolisthesis, cervical region: Secondary | ICD-10-CM | POA: Diagnosis not present

## 2023-03-19 DIAGNOSIS — M51362 Other intervertebral disc degeneration, lumbar region with discogenic back pain and lower extremity pain: Secondary | ICD-10-CM | POA: Diagnosis not present

## 2023-03-19 DIAGNOSIS — M9905 Segmental and somatic dysfunction of pelvic region: Secondary | ICD-10-CM | POA: Diagnosis not present

## 2023-03-19 DIAGNOSIS — M5031 Other cervical disc degeneration,  high cervical region: Secondary | ICD-10-CM | POA: Diagnosis not present

## 2023-03-20 DIAGNOSIS — S46212A Strain of muscle, fascia and tendon of other parts of biceps, left arm, initial encounter: Secondary | ICD-10-CM | POA: Diagnosis not present

## 2023-03-20 DIAGNOSIS — M25552 Pain in left hip: Secondary | ICD-10-CM | POA: Diagnosis not present

## 2023-03-20 DIAGNOSIS — M25561 Pain in right knee: Secondary | ICD-10-CM | POA: Diagnosis not present

## 2023-03-23 DIAGNOSIS — M5031 Other cervical disc degeneration,  high cervical region: Secondary | ICD-10-CM | POA: Diagnosis not present

## 2023-03-23 DIAGNOSIS — M9904 Segmental and somatic dysfunction of sacral region: Secondary | ICD-10-CM | POA: Diagnosis not present

## 2023-03-23 DIAGNOSIS — M9901 Segmental and somatic dysfunction of cervical region: Secondary | ICD-10-CM | POA: Diagnosis not present

## 2023-03-23 DIAGNOSIS — M5417 Radiculopathy, lumbosacral region: Secondary | ICD-10-CM | POA: Diagnosis not present

## 2023-03-23 DIAGNOSIS — M4312 Spondylolisthesis, cervical region: Secondary | ICD-10-CM | POA: Diagnosis not present

## 2023-03-23 DIAGNOSIS — M50322 Other cervical disc degeneration at C5-C6 level: Secondary | ICD-10-CM | POA: Diagnosis not present

## 2023-03-23 DIAGNOSIS — M51362 Other intervertebral disc degeneration, lumbar region with discogenic back pain and lower extremity pain: Secondary | ICD-10-CM | POA: Diagnosis not present

## 2023-03-23 DIAGNOSIS — M9903 Segmental and somatic dysfunction of lumbar region: Secondary | ICD-10-CM | POA: Diagnosis not present

## 2023-03-23 DIAGNOSIS — M51372 Other intervertebral disc degeneration, lumbosacral region with discogenic back pain and lower extremity pain: Secondary | ICD-10-CM | POA: Diagnosis not present

## 2023-03-23 DIAGNOSIS — M9905 Segmental and somatic dysfunction of pelvic region: Secondary | ICD-10-CM | POA: Diagnosis not present

## 2023-03-23 DIAGNOSIS — M9902 Segmental and somatic dysfunction of thoracic region: Secondary | ICD-10-CM | POA: Diagnosis not present

## 2023-03-24 ENCOUNTER — Other Ambulatory Visit: Payer: Self-pay

## 2023-03-24 ENCOUNTER — Emergency Department (HOSPITAL_COMMUNITY): Payer: PPO

## 2023-03-24 ENCOUNTER — Inpatient Hospital Stay (HOSPITAL_COMMUNITY)
Admission: EM | Admit: 2023-03-24 | Discharge: 2023-03-27 | DRG: 871 | Disposition: A | Discharge status: Home / Self Care | Payer: PPO | Attending: Internal Medicine | Admitting: Internal Medicine

## 2023-03-24 ENCOUNTER — Encounter (HOSPITAL_COMMUNITY): Payer: Self-pay | Admitting: Radiology

## 2023-03-24 DIAGNOSIS — Z8249 Family history of ischemic heart disease and other diseases of the circulatory system: Secondary | ICD-10-CM

## 2023-03-24 DIAGNOSIS — J189 Pneumonia, unspecified organism: Secondary | ICD-10-CM | POA: Diagnosis not present

## 2023-03-24 DIAGNOSIS — R6521 Severe sepsis with septic shock: Secondary | ICD-10-CM | POA: Diagnosis not present

## 2023-03-24 DIAGNOSIS — A084 Viral intestinal infection, unspecified: Secondary | ICD-10-CM | POA: Diagnosis present

## 2023-03-24 DIAGNOSIS — R0902 Hypoxemia: Secondary | ICD-10-CM | POA: Diagnosis not present

## 2023-03-24 DIAGNOSIS — Z800 Family history of malignant neoplasm of digestive organs: Secondary | ICD-10-CM

## 2023-03-24 DIAGNOSIS — N179 Acute kidney failure, unspecified: Secondary | ICD-10-CM | POA: Diagnosis present

## 2023-03-24 DIAGNOSIS — Z961 Presence of intraocular lens: Secondary | ICD-10-CM | POA: Diagnosis present

## 2023-03-24 DIAGNOSIS — Z85828 Personal history of other malignant neoplasm of skin: Secondary | ICD-10-CM | POA: Diagnosis not present

## 2023-03-24 DIAGNOSIS — K219 Gastro-esophageal reflux disease without esophagitis: Secondary | ICD-10-CM | POA: Diagnosis not present

## 2023-03-24 DIAGNOSIS — Z79899 Other long term (current) drug therapy: Secondary | ICD-10-CM

## 2023-03-24 DIAGNOSIS — A419 Sepsis, unspecified organism: Secondary | ICD-10-CM | POA: Diagnosis not present

## 2023-03-24 DIAGNOSIS — R059 Cough, unspecified: Secondary | ICD-10-CM | POA: Diagnosis not present

## 2023-03-24 DIAGNOSIS — E871 Hypo-osmolality and hyponatremia: Secondary | ICD-10-CM | POA: Diagnosis not present

## 2023-03-24 DIAGNOSIS — Z955 Presence of coronary angioplasty implant and graft: Secondary | ICD-10-CM | POA: Diagnosis not present

## 2023-03-24 DIAGNOSIS — Z7902 Long term (current) use of antithrombotics/antiplatelets: Secondary | ICD-10-CM

## 2023-03-24 DIAGNOSIS — Z9861 Coronary angioplasty status: Secondary | ICD-10-CM | POA: Diagnosis not present

## 2023-03-24 DIAGNOSIS — R799 Abnormal finding of blood chemistry, unspecified: Secondary | ICD-10-CM

## 2023-03-24 DIAGNOSIS — R0689 Other abnormalities of breathing: Secondary | ICD-10-CM | POA: Diagnosis not present

## 2023-03-24 DIAGNOSIS — E11319 Type 2 diabetes mellitus with unspecified diabetic retinopathy without macular edema: Secondary | ICD-10-CM | POA: Diagnosis present

## 2023-03-24 DIAGNOSIS — R000 Tachycardia, unspecified: Secondary | ICD-10-CM | POA: Diagnosis not present

## 2023-03-24 DIAGNOSIS — S199XXA Unspecified injury of neck, initial encounter: Secondary | ICD-10-CM | POA: Diagnosis not present

## 2023-03-24 DIAGNOSIS — E2740 Unspecified adrenocortical insufficiency: Secondary | ICD-10-CM

## 2023-03-24 DIAGNOSIS — Z0389 Encounter for observation for other suspected diseases and conditions ruled out: Secondary | ICD-10-CM | POA: Diagnosis not present

## 2023-03-24 DIAGNOSIS — Z96642 Presence of left artificial hip joint: Secondary | ICD-10-CM | POA: Diagnosis not present

## 2023-03-24 DIAGNOSIS — S3993XA Unspecified injury of pelvis, initial encounter: Secondary | ICD-10-CM | POA: Diagnosis not present

## 2023-03-24 DIAGNOSIS — E785 Hyperlipidemia, unspecified: Secondary | ICD-10-CM | POA: Diagnosis present

## 2023-03-24 DIAGNOSIS — Z1152 Encounter for screening for COVID-19: Secondary | ICD-10-CM | POA: Diagnosis not present

## 2023-03-24 DIAGNOSIS — I2510 Atherosclerotic heart disease of native coronary artery without angina pectoris: Secondary | ICD-10-CM | POA: Diagnosis not present

## 2023-03-24 DIAGNOSIS — S3991XA Unspecified injury of abdomen, initial encounter: Secondary | ICD-10-CM | POA: Diagnosis not present

## 2023-03-24 DIAGNOSIS — S299XXA Unspecified injury of thorax, initial encounter: Secondary | ICD-10-CM | POA: Diagnosis not present

## 2023-03-24 DIAGNOSIS — E861 Hypovolemia: Secondary | ICD-10-CM | POA: Diagnosis not present

## 2023-03-24 DIAGNOSIS — R296 Repeated falls: Secondary | ICD-10-CM | POA: Diagnosis present

## 2023-03-24 DIAGNOSIS — E872 Acidosis, unspecified: Secondary | ICD-10-CM | POA: Diagnosis not present

## 2023-03-24 DIAGNOSIS — M549 Dorsalgia, unspecified: Secondary | ICD-10-CM | POA: Diagnosis not present

## 2023-03-24 DIAGNOSIS — Z7982 Long term (current) use of aspirin: Secondary | ICD-10-CM

## 2023-03-24 DIAGNOSIS — Z885 Allergy status to narcotic agent status: Secondary | ICD-10-CM

## 2023-03-24 DIAGNOSIS — S0990XA Unspecified injury of head, initial encounter: Secondary | ICD-10-CM | POA: Diagnosis not present

## 2023-03-24 DIAGNOSIS — Z951 Presence of aortocoronary bypass graft: Secondary | ICD-10-CM | POA: Diagnosis not present

## 2023-03-24 DIAGNOSIS — Z880 Allergy status to penicillin: Secondary | ICD-10-CM

## 2023-03-24 DIAGNOSIS — I10 Essential (primary) hypertension: Secondary | ICD-10-CM | POA: Diagnosis not present

## 2023-03-24 DIAGNOSIS — E860 Dehydration: Secondary | ICD-10-CM | POA: Diagnosis not present

## 2023-03-24 DIAGNOSIS — R6520 Severe sepsis without septic shock: Secondary | ICD-10-CM | POA: Diagnosis not present

## 2023-03-24 DIAGNOSIS — Z888 Allergy status to other drugs, medicaments and biological substances status: Secondary | ICD-10-CM

## 2023-03-24 DIAGNOSIS — Z91041 Radiographic dye allergy status: Secondary | ICD-10-CM

## 2023-03-24 DIAGNOSIS — R579 Shock, unspecified: Secondary | ICD-10-CM | POA: Diagnosis not present

## 2023-03-24 DIAGNOSIS — S3992XA Unspecified injury of lower back, initial encounter: Secondary | ICD-10-CM | POA: Diagnosis not present

## 2023-03-24 DIAGNOSIS — R079 Chest pain, unspecified: Secondary | ICD-10-CM | POA: Diagnosis not present

## 2023-03-24 DIAGNOSIS — R197 Diarrhea, unspecified: Secondary | ICD-10-CM | POA: Diagnosis not present

## 2023-03-24 LAB — COMPREHENSIVE METABOLIC PANEL
ALT: 21 U/L (ref 0–44)
AST: 31 U/L (ref 15–41)
Albumin: 3.7 g/dL (ref 3.5–5.0)
Alkaline Phosphatase: 43 U/L (ref 38–126)
Anion gap: 9 (ref 5–15)
BUN: 49 mg/dL — ABNORMAL HIGH (ref 8–23)
CO2: 19 mmol/L — ABNORMAL LOW (ref 22–32)
Calcium: 8.7 mg/dL — ABNORMAL LOW (ref 8.9–10.3)
Chloride: 108 mmol/L (ref 98–111)
Creatinine, Ser: 1.83 mg/dL — ABNORMAL HIGH (ref 0.61–1.24)
GFR, Estimated: 37 mL/min — ABNORMAL LOW (ref >60)
Glucose, Bld: 172 mg/dL — ABNORMAL HIGH (ref 70–99)
Potassium: 4.1 mmol/L (ref 3.5–5.1)
Sodium: 136 mmol/L (ref 135–145)
Total Bilirubin: 1.8 mg/dL — ABNORMAL HIGH (ref 0.0–1.2)
Total Protein: 6.6 g/dL (ref 6.5–8.1)

## 2023-03-24 LAB — RESP PANEL BY RT-PCR (RSV, FLU A&B, COVID)  RVPGX2
Influenza A by PCR: NEGATIVE
Influenza B by PCR: NEGATIVE
Resp Syncytial Virus by PCR: NEGATIVE
SARS Coronavirus 2 by RT PCR: NEGATIVE

## 2023-03-24 LAB — APTT: aPTT: 26 s (ref 24–36)

## 2023-03-24 LAB — BASIC METABOLIC PANEL
Anion gap: 8 (ref 5–15)
BUN: 45 mg/dL — ABNORMAL HIGH (ref 8–23)
CO2: 18 mmol/L — ABNORMAL LOW (ref 22–32)
Calcium: 7.3 mg/dL — ABNORMAL LOW (ref 8.9–10.3)
Chloride: 106 mmol/L (ref 98–111)
Creatinine, Ser: 1.69 mg/dL — ABNORMAL HIGH (ref 0.61–1.24)
GFR, Estimated: 41 mL/min — ABNORMAL LOW (ref >60)
Glucose, Bld: 112 mg/dL — ABNORMAL HIGH (ref 70–99)
Potassium: 4.4 mmol/L (ref 3.5–5.1)
Sodium: 132 mmol/L — ABNORMAL LOW (ref 135–145)

## 2023-03-24 LAB — CBC WITH DIFFERENTIAL/PLATELET
Abs Immature Granulocytes: 0.01 10*3/uL (ref 0.00–0.07)
Basophils Absolute: 0 10*3/uL (ref 0.0–0.1)
Basophils Relative: 0 %
Eosinophils Absolute: 0 10*3/uL (ref 0.0–0.5)
Eosinophils Relative: 0 %
HCT: 43.4 % (ref 39.0–52.0)
Hemoglobin: 14.2 g/dL (ref 13.0–17.0)
Immature Granulocytes: 0 %
Lymphocytes Relative: 7 %
Lymphs Abs: 0.3 10*3/uL — ABNORMAL LOW (ref 0.7–4.0)
MCH: 32.6 pg (ref 26.0–34.0)
MCHC: 32.7 g/dL (ref 30.0–36.0)
MCV: 99.5 fL (ref 80.0–100.0)
Monocytes Absolute: 0.1 10*3/uL (ref 0.1–1.0)
Monocytes Relative: 2 %
Neutro Abs: 3.2 10*3/uL (ref 1.7–7.7)
Neutrophils Relative %: 91 %
Platelets: 216 10*3/uL (ref 150–400)
RBC: 4.36 MIL/uL (ref 4.22–5.81)
RDW: 14.9 % (ref 11.5–15.5)
WBC: 3.5 10*3/uL — ABNORMAL LOW (ref 4.0–10.5)
nRBC: 0 % (ref 0.0–0.2)

## 2023-03-24 LAB — PROTIME-INR
INR: 1.1 (ref 0.8–1.2)
Prothrombin Time: 14.9 s (ref 11.4–15.2)

## 2023-03-24 LAB — TROPONIN I (HIGH SENSITIVITY)
Troponin I (High Sensitivity): 22 ng/L — ABNORMAL HIGH (ref <18)
Troponin I (High Sensitivity): 56 ng/L — ABNORMAL HIGH (ref <18)

## 2023-03-24 LAB — I-STAT CG4 LACTIC ACID, ED
Lactic Acid, Venous: 4.4 mmol/L (ref 0.5–1.9)
Lactic Acid, Venous: 3.9 mmol/L (ref 0.5–1.9)

## 2023-03-24 LAB — LIPASE, BLOOD: Lipase: 25 U/L (ref 11–51)

## 2023-03-24 LAB — MAGNESIUM: Magnesium: 1.5 mg/dL — ABNORMAL LOW (ref 1.7–2.4)

## 2023-03-24 MED ORDER — FENTANYL CITRATE PF 50 MCG/ML IJ SOSY
50.0000 ug | PREFILLED_SYRINGE | Freq: Once | INTRAMUSCULAR | Status: AC
  Administered 2023-03-24: 50 ug via INTRAVENOUS
  Filled 2023-03-24: qty 1

## 2023-03-24 MED ORDER — SODIUM CHLORIDE 0.9 % IV BOLUS
500.0000 mL | Freq: Once | INTRAVENOUS | Status: AC
  Administered 2023-03-24: 500 mL via INTRAVENOUS

## 2023-03-24 MED ORDER — MAGNESIUM SULFATE 2 GM/50ML IV SOLN
2.0000 g | Freq: Once | INTRAVENOUS | Status: AC
  Administered 2023-03-24: 2 g via INTRAVENOUS
  Filled 2023-03-24: qty 50

## 2023-03-24 MED ORDER — SODIUM CHLORIDE 0.9 % IV SOLN
500.0000 mg | Freq: Once | INTRAVENOUS | Status: AC
  Administered 2023-03-24: 500 mg via INTRAVENOUS
  Filled 2023-03-24: qty 5

## 2023-03-24 MED ORDER — LACTATED RINGERS IV BOLUS
1000.0000 mL | Freq: Once | INTRAVENOUS | Status: AC
  Administered 2023-03-24: 1000 mL via INTRAVENOUS

## 2023-03-24 MED ORDER — SODIUM CHLORIDE 0.9 % IV SOLN
2.0000 g | Freq: Once | INTRAVENOUS | Status: AC
  Administered 2023-03-24: 2 g via INTRAVENOUS
  Filled 2023-03-24: qty 20

## 2023-03-24 MED ORDER — HYDROCORTISONE SOD SUC (PF) 100 MG IJ SOLR
50.0000 mg | Freq: Three times a day (TID) | INTRAMUSCULAR | Status: DC
  Administered 2023-03-25: 50 mg via INTRAVENOUS
  Filled 2023-03-24: qty 2

## 2023-03-24 MED ORDER — LACTATED RINGERS IV SOLN: INTRAVENOUS | Status: AC

## 2023-03-24 MED ORDER — LACTATED RINGERS IV BOLUS (SEPSIS)
1000.0000 mL | Freq: Once | INTRAVENOUS | Status: AC
  Administered 2023-03-24: 1000 mL via INTRAVENOUS

## 2023-03-24 MED ORDER — RANOLAZINE ER 500 MG PO TB12
1000.0000 mg | ORAL_TABLET | Freq: Every day | ORAL | Status: DC
  Administered 2023-03-25 – 2023-03-27 (×3): 1000 mg via ORAL
  Filled 2023-03-24 (×4): qty 2

## 2023-03-24 MED ORDER — METHYLPREDNISOLONE SODIUM SUCC 125 MG IJ SOLR
125.0000 mg | Freq: Once | INTRAMUSCULAR | Status: AC
  Administered 2023-03-24: 125 mg via INTRAVENOUS
  Filled 2023-03-24: qty 2

## 2023-03-24 MED ORDER — ONDANSETRON HCL 4 MG/2ML IJ SOLN
4.0000 mg | Freq: Once | INTRAMUSCULAR | Status: AC
  Administered 2023-03-24: 4 mg via INTRAVENOUS
  Filled 2023-03-24: qty 2

## 2023-03-24 MED ORDER — EZETIMIBE 10 MG PO TABS
5.0000 mg | ORAL_TABLET | Freq: Every evening | ORAL | Status: DC
  Administered 2023-03-25: 5 mg via ORAL
  Filled 2023-03-24: qty 1

## 2023-03-24 MED ORDER — MORPHINE SULFATE (PF) 4 MG/ML IV SOLN
4.0000 mg | Freq: Once | INTRAVENOUS | Status: AC
  Administered 2023-03-24: 4 mg via INTRAVENOUS
  Filled 2023-03-24: qty 1

## 2023-03-24 MED ORDER — ROSUVASTATIN CALCIUM 20 MG PO TABS
20.0000 mg | ORAL_TABLET | Freq: Every day | ORAL | Status: DC
  Administered 2023-03-25 – 2023-03-26 (×2): 20 mg via ORAL
  Filled 2023-03-24 (×2): qty 1

## 2023-03-24 MED ORDER — NOREPINEPHRINE 4 MG/250ML-% IV SOLN
0.0000 ug/min | INTRAVENOUS | Status: DC
  Administered 2023-03-24: 2 ug/min via INTRAVENOUS
  Filled 2023-03-24: qty 250

## 2023-03-24 MED ORDER — BENZONATATE 100 MG PO CAPS: 100.0000 mg | ORAL_CAPSULE | Freq: Three times a day (TID) | ORAL | Status: DC | PRN

## 2023-03-24 MED ORDER — ASPIRIN 81 MG PO CHEW
81.0000 mg | CHEWABLE_TABLET | Freq: Every evening | ORAL | Status: DC
  Administered 2023-03-24 – 2023-03-26 (×3): 81 mg via ORAL
  Filled 2023-03-24 (×3): qty 1

## 2023-03-24 MED ORDER — PANTOPRAZOLE SODIUM 40 MG PO TBEC
40.0000 mg | DELAYED_RELEASE_TABLET | Freq: Every day | ORAL | Status: DC
  Administered 2023-03-24 – 2023-03-27 (×4): 40 mg via ORAL
  Filled 2023-03-24 (×3): qty 1

## 2023-03-24 MED ORDER — LACTATED RINGERS IV SOLN: INTRAVENOUS | Status: DC

## 2023-03-24 MED ORDER — ACETAMINOPHEN 500 MG PO TABS
1000.0000 mg | ORAL_TABLET | Freq: Once | ORAL | Status: AC
  Administered 2023-03-24: 1000 mg via ORAL
  Filled 2023-03-24: qty 2

## 2023-03-24 NOTE — ED Notes (Signed)
 Dr Jearld Fenton notified of rectal temp of 102.7

## 2023-03-24 NOTE — Consult Note (Signed)
 NAME:  [name], MRN:  [id number], DOB:  1945/04/25, LOS: 0 ADMISSION DATE:  03/24/2023, CONSULTATION DATE:  03/24/2023 REFERRING MD:  Franklyn, CHIEF COMPLAINT:  hypotension   History of Present Illness:  1 day of profuse diarrhea and vomiting with positive sick contacts.  Still having marginal BP after 4+ liters of fluids.  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   Noted on EKG   Left axis deviation 05/2018   Noted on EKG   Lipoma 04/20/2018   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   Loop Biotronik Biomonitor III for syncope and collapse 04/09/2021 04/09/2021   LVH (left ventricular hypertrophy) 05/2018   Noted on EKG   Phimosis    Pneumonia    Syncope and collapse    Tuberculosis 1956   Wears glasses    Wheezing    Scheduled Meds:  aspirin   81 mg Oral QPM   ezetimibe   5 mg Oral QPM   hydrocortisone  sod succinate (SOLU-CORTEF ) inj  50 mg Intravenous Q8H   pantoprazole   40 mg Oral Daily   ranolazine   1,000 mg Oral Daily   rosuvastatin   20 mg Oral QHS   Continuous Infusions:  lactated ringers  Stopped (03/24/23 2344)   lactated ringers  100 mL/hr at 03/24/23 2321   magnesium  sulfate bolus IVPB 2 g (03/24/23 2322)   PRN Meds:.benzonatate    Significant Hospital Events: Including procedures, antibiotic start and stop dates in addition to other pertinent events   1/14 - ED  Interim History  / Subjective:  States feels better  Objective   Blood pressure (!) 86/47, pulse 71, temperature 98.8 F (37.1 C), temperature source Oral, resp. rate 16, height 5' 7 (1.702 m), weight 83.9 kg, SpO2 94%.        Intake/Output Summary (Last 24 hours) at 03/24/2023 2359 Last data filed at 03/24/2023 2344 Gross per 24 hour  Intake 7000.06 ml  Output --  Net 7000.06 ml   Filed Weights   03/24/23 1555  Weight: 83.9 kg    Examination: General: no distress looks flushed.  HENT: No icterus. Mucosa moist Lungs: Clear Cardiovascular: HS normal. Extremities warm. JVP 2cm ASA Abdomen: ++ BS. Soft Extremities: no edema Neuro: Sensorium clear, nil focal.  GU: Deferred  Ancillary test personally reviewed   Creatinine 1.8 WBC 3.8 CT consistent with diarrheal illness.   Assessment & Plan:  Hypovolemia due to infectious viral gastroenteritis.  Insensitive losses from fever Baseline hypotension at home.   - Continue fluids resuscitation IV. - Start oral rehydration - Resume home midodrine  now that nausea has settled.   Best Practice (right click and Reselect all SmartList Selections daily)   Diet/type: clear liquids DVT prophylaxis prophylactic heparin   Pressure ulcer(s): N/A GI prophylaxis: N/A Lines: N/A Foley:  N/A Code Status:  full  code Last date of multidisciplinary goals of care discussion [at bedside]  Fredia Alderton, MD [facility] ICU Physician [facility]  Pager: [phone] Or Epic Secure Chat After hours: [phone].  03/24/2023, 11:59 PM

## 2023-03-24 NOTE — ED Notes (Signed)
 Pt has a lactic critical of 3.94 and physician was made aware.

## 2023-03-24 NOTE — ED Provider Notes (Signed)
 11:22 PM Assumed care of patient from off-going team. For more details, please see note from same day.  In brief, this is a 78 y.o. male with known PNA and norovirus who presented w/ 2 falls at home, fever, hypoxia, neck pain, back pain, CP. CTs are okay. 99% on 2L. Tachycardia improved with fluids. C/f possible peaked T waves on EKG. Has been on antibiotics for PNA.   Plan/Dispo at time of sign-out & ED Course since sign-out: [ ]  CMP, pending admit for sepsis  BP (!) 144/53   Pulse 85   Temp 98.8 F (37.1 C) (Oral)   Resp 16   Ht 5' 7 (1.702 m)   Wt 83.9 kg   SpO2 94%   BMI 28.98 kg/m    ED Course:   Clinical Course as of 03/24/23 2322  Tue Mar 24, 2023  1758 Creatinine(!): 1.83 +AKI w/ elevated BUN, possibly prerenal [HN]  1758 CO2(!): 19 NAGMA in s/o diarrhea [HN]  1844 While hospitalist Dr. Arthea was evaluating the patient she noted that he was newly hypotensive into the 70s systolic.  She ordered 4th L bolus for him.  Lactate is decreasing.Will monitor his BP closely which has begun to improve but if recurrently hypotensive will start pressors and admit to ICU. [HN]  2208 Patient recurrently hypotensive after 3.5 L IVF. Hospitalist had ordered stress dose steroids as well. Will order gtt norepinephrine  and d/w ICU. [HN]  2209 BP(!): 81/52 [HN]  2217 D/w intensivist Dr. Connie who recommends another fluid bolus and maintenance fluid, will come to see him. [HN]  2321 ICU physician okayed patient for stepdown with continued fluid resuscitation. [HN]    Clinical Course User Index [HN] Franklyn Sid SAILOR, MD    Dispo: Admitted to stepdown. ------------------------------- Sid Franklyn, MD Emergency Medicine  This note was created using dictation software, which may contain spelling or grammatical errors.   Franklyn Sid SAILOR, MD 03/24/23 2322

## 2023-03-24 NOTE — Sepsis Progress Note (Signed)
 Sepsis protocol monitored by eLink ?

## 2023-03-24 NOTE — ED Notes (Signed)
 Lactic Critical of 4.36. Physician was made aware.

## 2023-03-24 NOTE — ED Provider Notes (Signed)
 [place] EMERGENCY DEPARTMENT AT [facility] Provider Note   CSN: [id number] Arrival date & time: 03/24/23  1532     History  No chief complaint on file.   [name] is a 78 y.o. male.  The history is provided by the patient and medical records. No language interpreter was used.  Illness Location:  Reported no pneumonia and norovirus,falls today Severity:  Severe Onset quality:  Gradual Timing:  Constant Progression:  Waxing and waning Chronicity:  New Associated symptoms: abdominal pain, chest pain, cough, diarrhea, fatigue, headaches, nausea, shortness of breath and vomiting   Associated symptoms: no congestion, no loss of consciousness, no rash and no wheezing        Home Medications Prior to Admission medications   Medication Sig Start Date End Date Taking? Authorizing Provider  acetaminophen  (TYLENOL ) 500 MG tablet Take 1,000 mg by mouth every 6 (six) hours as needed for moderate pain.    [provider]  aspirin  81 MG chewable tablet Chew 81 mg by mouth every evening.    [provider]  benzonatate  (TESSALON ) 100 MG capsule Take 1 capsule (100 mg total) by mouth 3 (three) times daily as needed for cough. 02/16/23   Arloa Suzen RAMAN, NP  Calcium  Carbonate Antacid (TUMS ULTRA PO) Take 2 tablets by mouth daily as needed (acid reflux/indigestion.).    [provider]  clopidogrel  (PLAVIX ) 75 MG tablet Take 1 tablet (75 mg total) by mouth daily. 11/25/22   Ladona Heinz, MD  Coenzyme Q10 (COQ10 PO) Take 300 mg by mouth 2 (two) times daily.    [provider]  Cyanocobalamin  (VITAMIN B-12) 5000 MCG TBDP Take 5,000 mcg by mouth every evening. 12/07/20   [provider]  doxycycline  (VIBRAMYCIN ) 100 MG capsule Take 1 capsule (100 mg total) by mouth 2 (two) times daily. 02/16/23   Arloa Suzen RAMAN, NP  ezetimibe  (ZETIA ) 10 MG tablet TAKE 1/2 TABLET BY MOUTH EVERY EVENING 08/21/21   Ladona Heinz, MD  ferrous sulfate  325  (65 FE) MG tablet Take 1 tablet (325 mg total) by mouth daily with breakfast. 12/03/22   Regalado, Belkys A, MD  fish oil-omega-3 fatty acids  1000 MG capsule Take 1 g by mouth in the morning and at bedtime.    [provider]  fluconazole (DIFLUCAN) 200 MG tablet Take 200 mg by mouth every Saturday. Patient not taking: Reported on 02/03/2023 05/14/22   [provider]  fluticasone  (FLONASE ) 50 MCG/ACT nasal spray Place 1 spray into both nostrils daily. 12/02/22   Regalado, Belkys A, MD  folic acid  (FOLVITE ) 1 MG tablet Take 1 tablet (1 mg total) by mouth daily. 12/02/22   Regalado, Belkys A, MD  hydrochlorothiazide  (MICROZIDE ) 12.5 MG capsule Take 1 capsule (12.5 mg total) by mouth daily. 01/07/23 04/07/23  Emelia Josefa HERO, NP  hydrocortisone  (CORTEF ) 10 MG tablet Take 1 tablet (10 mg total) by mouth in the morning. 12/02/22   Regalado, Belkys A, MD  hydrocortisone  (CORTEF ) 5 MG tablet Take 1 tablet (5 mg total) by mouth daily in the afternoon. 12/02/22   Regalado, Belkys A, MD  ipratropium-albuterol  (DUONEB) 0.5-2.5 (3) MG/3ML SOLN Take 3 mLs by nebulization every 6 (six) hours as needed (wheezing or shortness of breath). 02/16/23   Arloa Suzen RAMAN, NP  metoprolol  succinate (TOPROL -XL) 50 MG 24 hr tablet Take 1 tablet (50 mg total) by mouth daily. Take with or immediately following a meal. 02/04/23 05/05/23  Camnitz, Soyla Lunger, MD  midodrine  (PROAMATINE )  2.5 MG tablet Take 1 tablet (2.5 mg total) by mouth 3 (three) times daily with meals as needed (SBP less than 100). 12/02/22   Regalado, Belkys A, MD  Multiple Vitamin (MULTIVITAMIN) tablet Take 1 tablet by mouth in the morning. Seniors    [provider]  nitroGLYCERIN  (NITROSTAT ) 0.4 MG SL tablet DISSOLVE 1 TABLET UNDER THE TONGUE AS NEEDED FOR CHEST PAIN EVERY 5 MINUTES UP TO 3 TIMES. IF NO RELIEF CALL 911. 01/28/23   Ladona Heinz, MD  pantoprazole  (PROTONIX ) 40 MG tablet Take 1 tablet (40 mg total) by mouth daily. 03/17/16    Elgergawy, Brayton RAMAN, MD  Polyethyl Glycol-Propyl Glycol (SYSTANE) 0.4-0.3 % GEL ophthalmic gel Place 1 application into both eyes at bedtime.    [provider]  Propylene Glycol (SYSTANE BALANCE) 0.6 % SOLN Place 1 drop into both eyes 3 (three) times daily as needed (dry/irritated eyes.).    [provider]  pyridoxine  (B-6) 100 MG tablet Take 100 mg by mouth every evening.    [provider]  ranolazine  (RANEXA ) 1000 MG SR tablet Take 1 tablet (1,000 mg total) by mouth daily. 03/06/23   Ladona Heinz, MD  rosuvastatin  (CRESTOR ) 20 MG tablet TAKE 1 TABLET BY MOUTH EVERY DAY AT BEDTIME 12/26/22   Ladona Heinz, MD  traMADol  (ULTRAM ) 50 MG tablet Take 50 mg by mouth 3 (three) times daily as needed for moderate pain. 10/20/22   [provider]  vitamin C  (ASCORBIC ACID ) 500 MG tablet Take 500 mg by mouth daily.    [provider]      Allergies    Codeine, Guaifenesin & derivatives, Terbinafine and related, Iodinated contrast media, and Penicillins    Review of Systems   Review of Systems  Constitutional:  Positive for chills and fatigue.  HENT:  Negative for congestion.   Respiratory:  Positive for cough, chest tightness and shortness of breath. Negative for wheezing.   Cardiovascular:  Positive for chest pain. Negative for palpitations and leg swelling.  Gastrointestinal:  Positive for abdominal pain, diarrhea, nausea and vomiting. Negative for constipation.  Genitourinary:  Negative for dysuria and flank pain.  Musculoskeletal:  Positive for back pain and neck pain.  Skin:  Negative for rash and wound.  Neurological:  Positive for headaches. Negative for loss of consciousness.  Psychiatric/Behavioral:  Negative for agitation and confusion.   All other systems reviewed and are negative.   Physical Exam Updated Vital Signs BP (!) 152/94   Pulse (!) 55   Temp 99.5 F (37.5 C) (Oral)   Resp (!) 21   Ht 5' 7 (1.702 m)   Wt 83.9 kg   SpO2 99%    BMI 28.98 kg/m  Physical Exam Vitals and nursing note reviewed.  Constitutional:      General: He is not in acute distress.    Appearance: He is well-developed. He is ill-appearing. He is not toxic-appearing or diaphoretic.  HENT:     Head: Normocephalic and atraumatic.     Nose: No congestion or rhinorrhea.     Mouth/Throat:     Mouth: Mucous membranes are dry.     Pharynx: No oropharyngeal exudate or posterior oropharyngeal erythema.  Eyes:     Extraocular Movements: Extraocular movements intact.     Conjunctiva/sclera: Conjunctivae normal.     Pupils: Pupils are equal, round, and reactive to light.  Cardiovascular:     Rate and Rhythm: Regular rhythm. Tachycardia present.     Heart sounds: No murmur heard.  Pulmonary:     Effort: Pulmonary effort is normal. No respiratory distress.     Breath sounds: No stridor. Rhonchi present. No wheezing or rales.  Chest:     Chest wall: Tenderness present.  Abdominal:     Palpations: Abdomen is soft.     Tenderness: There is abdominal tenderness. There is no guarding or rebound.  Musculoskeletal:        General: Tenderness present. No swelling.     Cervical back: Neck supple. Tenderness present.     Right lower leg: No edema.     Left lower leg: No edema.  Skin:    General: Skin is warm and dry.     Capillary Refill: Capillary refill takes less than 2 seconds.     Findings: No erythema or rash.  Neurological:     General: No focal deficit present.     Mental Status: He is alert.     Sensory: No sensory deficit.     Motor: No weakness.  Psychiatric:        Mood and Affect: Mood normal.     ED Results / Procedures / Treatments   Labs (all labs ordered are listed, but only abnormal results are displayed) Labs Reviewed  CBC WITH DIFFERENTIAL/PLATELET - Abnormal; Notable for the following components:      Result Value   WBC 3.5 (*)    Lymphs Abs 0.3 (*)    All other components within normal limits  I-STAT CG4 LACTIC ACID, ED -  Abnormal; Notable for the following components:   Lactic Acid, Venous 4.4 (*)    All other components within normal limits  RESP PANEL BY RT-PCR (RSV, FLU A&B, COVID)  RVPGX2  CULTURE, BLOOD (ROUTINE X 2)  CULTURE, BLOOD (ROUTINE X 2)  COMPREHENSIVE METABOLIC PANEL  PROTIME-INR  APTT  URINALYSIS, W/ REFLEX TO CULTURE (INFECTION SUSPECTED)  LIPASE, BLOOD  TROPONIN I (HIGH SENSITIVITY)    EKG EKG Interpretation Date/Time:  Tuesday March 24 2023 15:48:25 EST Ventricular Rate:  102 PR Interval:  129 QRS Duration:  121 QT Interval:  321 QTC Calculation: 419 R Axis:   241  Text Interpretation: Sinus tachycardia Atrial premature complex Nonspecific IVCD with LAD Abnormal T, consider ischemia, lateral leads ST elevation, consider inferior injury when compared to prior, slightly sharper t waves and faster rate.   No STEMI Confirmed by Ginger Barefoot ([id number]) on 03/24/2023 4:36:55 PM  Radiology CT CHEST ABDOMEN PELVIS WO CONTRAST Result Date: 03/24/2023 CLINICAL DATA:  Polytrauma, blunt Polytrauma with pain in head, neck, chest/ab/pelvis, back after falls. Also hypoxic with pneumonia. Fall. Chest and back pain. Cough. EXAM: CT CHEST, ABDOMEN AND PELVIS WITHOUT CONTRAST TECHNIQUE: Multidetector CT imaging of the chest, abdomen and pelvis was performed following the standard protocol without IV contrast. RADIATION DOSE REDUCTION: This exam was performed according to the departmental dose-optimization program which includes automated exposure control, adjustment of the mA and/or kV according to patient size and/or use of iterative reconstruction technique. COMPARISON:  Contrast enhanced CT 09/11/2021 FINDINGS: CT CHEST FINDINGS Cardiovascular: The heart is upper normal in size. Native coronary artery calcifications, post CABG. Aortic atherosclerosis without aneurysm. No pericardial effusion. Mediastinum/Nodes: No mediastinal adenopathy. No thyroid nodule. Small hiatal hernia with patulous esophagus.  Lungs/Pleura: Breathing motion artifact. Minimal bandlike atelectasis in the right middle lobe. No evidence of pneumonia or confluent airspace disease. No pleural effusion. Trachea and central airways are patent. Musculoskeletal: Lumbar spine assessed on concurrent lumbar spine chest thoracic spine assessed on concurrent thoracic spine  reformats, reported separately prior median sternotomy. No acute fracture of the ribs or shoulder girdles left chest wall loop recorder. CT ABDOMEN PELVIS FINDINGS Hepatobiliary: Lack of contrast limits assessment for hepatic injury. Additionally there is artifact from arms down positioning. Allowing for these limitations, there is no evidence of injury. No perihepatic hematoma. Unremarkable gallbladder. Pancreas: No evidence of injury. No ductal dilatation or inflammation. Spleen: No evidence of injury on this unenhanced exam. Adrenals/Urinary Tract: No adrenal nodule or hemorrhage. No hydronephrosis or renal injury. No renal calculi. 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 This is a chronic finding. Wall thickening about the bladder dome measuring up to 12 mm on the right. Streak artifact from left hip arthroplasty obscures detailed bladder assessment. Stomach/Bowel: Small hiatal hernia. The stomach is decompressed. Small duodenal diverticulum. Fluid throughout small bowel without wall thickening or inflammation. Normal appendix. Liquid stool in the proximal colon. Remainder of the colon is decompressed. There is no colonic inflammation. Vascular/Lymphatic: Aortic atherosclerosis. No aneurysm. No enlarged lymph nodes in the abdomen or pelvis. Reproductive: Prostate is unremarkable. Other: No free air or ascites. Diminutive fat containing umbilical hernia. Minimal fat in the inguinal canals. Musculoskeletal: Left hip arthroplasty. No acute pelvic fracture. Sacroiliac joints  are partially fused. Lumbar spine is assessed on concurrent lumbar spine reformats, reported separately. IMPRESSION: 1. No evidence of acute traumatic injury. Please note thoracic and lumbar spine reformats/assessment reported separately. 2. Fluid within small bowel and liquid stool in the ascending colon can be seen with diarrheal illness. 3. Small hiatal hernia. 4. Wall thickening about the bladder dome is nonspecific. Aortic Atherosclerosis (ICD10-I70.0). Electronically Signed   By: Andrea Gasman M.D.   On: 03/24/2023 16:49   CT HEAD WO CONTRAST ( ) Result Date: 03/24/2023 CLINICAL DATA:  Polytrauma, blunt EXAM: CT HEAD WITHOUT CONTRAST CT CERVICAL SPINE WITHOUT CONTRAST CT THORACIC SPINE WITHOUT CONTRAST CT LUMBAR SPINE WITHOUT CONTRAST TECHNIQUE: Multidetector CT imaging of the head, cervical spine, thoracic spine, and lumbar spine was performed following the standard protocol without intravenous contrast. Multiplanar CT image reconstructions of the cervical spine were also generated. RADIATION DOSE REDUCTION: This exam was performed according to the departmental dose-optimization program which includes automated exposure control, adjustment of the mA and/or kV according to patient size and/or use of iterative reconstruction technique. COMPARISON:  09/11/21 CT CAP FINDINGS: CT HEAD FINDINGS Brain: No hemorrhage. No hydrocephalus. No extra-axial fluid collection. No CT evidence of an acute cortical infarct. No mass effect. No mass lesion. Vascular: No hyperdense vessel or unexpected calcification. Skull: Normal. Negative for fracture or focal lesion. Sinuses/Orbits: No middle ear or mastoid effusion. Mucosal thickening in the left sphenoid sinus. Bilateral lens replacement. Orbits are otherwise unremarkable. Other: None. CT CERVICAL SPINE FINDINGS Alignment: Trace retrolisthesis of C3 on C4. Skull base and vertebrae: No acute fracture. No primary bone lesion or focal pathologic process. Soft tissues and  spinal canal: No prevertebral fluid or swelling. No visible canal hematoma. Disc levels:  No CT evidence of high-grade spinal canal stenosis. Upper chest: Negative. Other: None CT THORACIC SPINE FINDINGS Alignment: Normal. Vertebrae: No acute fracture or focal pathologic process. Paraspinal and other soft tissues: Negative. Disc levels: No CT evidence of high-grade spinal canal stenosis CT LUMBAR SPINE FINDINGS Segmentation: 5 lumbar type vertebrae. Alignment: Normal. Vertebrae: Minimal superior endplate compression deformity at L1, unchanged from 09/11/2021. Posterior decompression at L3. Paraspinal and other soft tissues: Aortic atherosclerotic calcifications.  Disc levels: No CT evidence of high-grade spinal canal stenosis. IMPRESSION: 1. No CT evidence of intracranial injury. 2. No acute fracture or traumatic malalignment of the cervical, thoracic, or lumbar spine. Aortic Atherosclerosis (ICD10-I70.0). Electronically Signed   By: Lyndall Gore M.D.   On: 03/24/2023 16:43   CT Cervical Spine Wo Contrast Result Date: 03/24/2023 CLINICAL DATA:  Polytrauma, blunt EXAM: CT HEAD WITHOUT CONTRAST CT CERVICAL SPINE WITHOUT CONTRAST CT THORACIC SPINE WITHOUT CONTRAST CT LUMBAR SPINE WITHOUT CONTRAST TECHNIQUE: Multidetector CT imaging of the head, cervical spine, thoracic spine, and lumbar spine was performed following the standard protocol without intravenous contrast. Multiplanar CT image reconstructions of the cervical spine were also generated. RADIATION DOSE REDUCTION: This exam was performed according to the departmental dose-optimization program which includes automated exposure control, adjustment of the mA and/or kV according to patient size and/or use of iterative reconstruction technique. COMPARISON:  09/11/21 CT CAP FINDINGS: CT HEAD FINDINGS Brain: No hemorrhage. No hydrocephalus. No extra-axial fluid collection. No CT evidence of an acute cortical infarct. No mass effect. No mass lesion. Vascular: No  hyperdense vessel or unexpected calcification. Skull: Normal. Negative for fracture or focal lesion. Sinuses/Orbits: No middle ear or mastoid effusion. Mucosal thickening in the left sphenoid sinus. Bilateral lens replacement. Orbits are otherwise unremarkable. Other: None. CT CERVICAL SPINE FINDINGS Alignment: Trace retrolisthesis of C3 on C4. Skull base and vertebrae: No acute fracture. No primary bone lesion or focal pathologic process. Soft tissues and spinal canal: No prevertebral fluid or swelling. No visible canal hematoma. Disc levels:  No CT evidence of high-grade spinal canal stenosis. Upper chest: Negative. Other: None CT THORACIC SPINE FINDINGS Alignment: Normal. Vertebrae: No acute fracture or focal pathologic process. Paraspinal and other soft tissues: Negative. Disc levels: No CT evidence of high-grade spinal canal stenosis CT LUMBAR SPINE FINDINGS Segmentation: 5 lumbar type vertebrae. Alignment: Normal. Vertebrae: Minimal superior endplate compression deformity at L1, unchanged from 09/11/2021. Posterior decompression at L3. Paraspinal and other soft tissues: Aortic atherosclerotic calcifications. Disc levels: No CT evidence of high-grade spinal canal stenosis. IMPRESSION: 1. No CT evidence of intracranial injury. 2. No acute fracture or traumatic malalignment of the cervical, thoracic, or lumbar spine. Aortic Atherosclerosis (ICD10-I70.0). Electronically Signed   By: Lyndall Gore M.D.   On: 03/24/2023 16:43   CT L-SPINE NO CHARGE Result Date: 03/24/2023 CLINICAL DATA:  Polytrauma, blunt EXAM: CT HEAD WITHOUT CONTRAST CT CERVICAL SPINE WITHOUT CONTRAST CT THORACIC SPINE WITHOUT CONTRAST CT LUMBAR SPINE WITHOUT CONTRAST TECHNIQUE: Multidetector CT imaging of the head, cervical spine, thoracic spine, and lumbar spine was performed following the standard protocol without intravenous contrast. Multiplanar CT image reconstructions of the cervical spine were also generated. RADIATION DOSE REDUCTION:  This exam was performed according to the departmental dose-optimization program which includes automated exposure control, adjustment of the mA and/or kV according to patient size and/or use of iterative reconstruction technique. COMPARISON:  09/11/21 CT CAP FINDINGS: CT HEAD FINDINGS Brain: No hemorrhage. No hydrocephalus. No extra-axial fluid collection. No CT evidence of an acute cortical infarct. No mass effect. No mass lesion. Vascular: No hyperdense vessel or unexpected calcification. Skull: Normal. Negative for fracture or focal lesion. Sinuses/Orbits: No middle ear or mastoid effusion. Mucosal thickening in the left sphenoid sinus. Bilateral lens replacement. Orbits are otherwise unremarkable. Other: None. CT CERVICAL SPINE FINDINGS Alignment: Trace retrolisthesis of C3 on C4. Skull base and vertebrae: No acute fracture. No primary bone lesion or focal pathologic process. Soft tissues and spinal canal: No prevertebral fluid or  swelling. No visible canal hematoma. Disc levels:  No CT evidence of high-grade spinal canal stenosis. Upper chest: Negative. Other: None CT THORACIC SPINE FINDINGS Alignment: Normal. Vertebrae: No acute fracture or focal pathologic process. Paraspinal and other soft tissues: Negative. Disc levels: No CT evidence of high-grade spinal canal stenosis CT LUMBAR SPINE FINDINGS Segmentation: 5 lumbar type vertebrae. Alignment: Normal. Vertebrae: Minimal superior endplate compression deformity at L1, unchanged from 09/11/2021. Posterior decompression at L3. Paraspinal and other soft tissues: Aortic atherosclerotic calcifications. Disc levels: No CT evidence of high-grade spinal canal stenosis. IMPRESSION: 1. No CT evidence of intracranial injury. 2. No acute fracture or traumatic malalignment of the cervical, thoracic, or lumbar spine. Aortic Atherosclerosis (ICD10-I70.0). Electronically Signed   By: Lyndall Gore M.D.   On: 03/24/2023 16:43   CT T-SPINE NO CHARGE Result Date:  03/24/2023 CLINICAL DATA:  Polytrauma, blunt EXAM: CT HEAD WITHOUT CONTRAST CT CERVICAL SPINE WITHOUT CONTRAST CT THORACIC SPINE WITHOUT CONTRAST CT LUMBAR SPINE WITHOUT CONTRAST TECHNIQUE: Multidetector CT imaging of the head, cervical spine, thoracic spine, and lumbar spine was performed following the standard protocol without intravenous contrast. Multiplanar CT image reconstructions of the cervical spine were also generated. RADIATION DOSE REDUCTION: This exam was performed according to the departmental dose-optimization program which includes automated exposure control, adjustment of the mA and/or kV according to patient size and/or use of iterative reconstruction technique. COMPARISON:  09/11/21 CT CAP FINDINGS: CT HEAD FINDINGS Brain: No hemorrhage. No hydrocephalus. No extra-axial fluid collection. No CT evidence of an acute cortical infarct. No mass effect. No mass lesion. Vascular: No hyperdense vessel or unexpected calcification. Skull: Normal. Negative for fracture or focal lesion. Sinuses/Orbits: No middle ear or mastoid effusion. Mucosal thickening in the left sphenoid sinus. Bilateral lens replacement. Orbits are otherwise unremarkable. Other: None. CT CERVICAL SPINE FINDINGS Alignment: Trace retrolisthesis of C3 on C4. Skull base and vertebrae: No acute fracture. No primary bone lesion or focal pathologic process. Soft tissues and spinal canal: No prevertebral fluid or swelling. No visible canal hematoma. Disc levels:  No CT evidence of high-grade spinal canal stenosis. Upper chest: Negative. Other: None CT THORACIC SPINE FINDINGS Alignment: Normal. Vertebrae: No acute fracture or focal pathologic process. Paraspinal and other soft tissues: Negative. Disc levels: No CT evidence of high-grade spinal canal stenosis CT LUMBAR SPINE FINDINGS Segmentation: 5 lumbar type vertebrae. Alignment: Normal. Vertebrae: Minimal superior endplate compression deformity at L1, unchanged from 09/11/2021. Posterior  decompression at L3. Paraspinal and other soft tissues: Aortic atherosclerotic calcifications. Disc levels: No CT evidence of high-grade spinal canal stenosis. IMPRESSION: 1. No CT evidence of intracranial injury. 2. No acute fracture or traumatic malalignment of the cervical, thoracic, or lumbar spine. Aortic Atherosclerosis (ICD10-I70.0). Electronically Signed   By: Lyndall Gore M.D.   On: 03/24/2023 16:43   DG Chest Port 1 View Result Date: 03/24/2023 CLINICAL DATA:  Questionable sepsis - evaluate for abnormality Chest and back pain.  Cough. EXAM: PORTABLE CHEST 1 VIEW COMPARISON:  Same day chest CT, available at time of radiograph interpretation. FINDINGS: Prior median sternotomy and CABG. Left chest wall loop recorder.The cardiomediastinal contours are normal. Pulmonary vasculature is normal. No consolidation, pleural effusion, or pneumothorax. No acute osseous abnormalities are seen. IMPRESSION: No active disease. Electronically Signed   By: Andrea Gasman M.D.   On: 03/24/2023 16:38    Procedures Procedures    CRITICAL CARE Performed by: Lonni PARAS Freddi Schrager Total critical care time: 35 minutes Critical care time was exclusive of separately billable procedures and treating  other patients. Critical care was necessary to treat or prevent imminent or life-threatening deterioration. Critical care was time spent personally by me on the following activities: development of treatment plan with patient and/or surrogate as well as nursing, discussions with consultants, evaluation of patient's response to treatment, examination of patient, obtaining history from patient or surrogate, ordering and performing treatments and interventions, ordering and review of laboratory studies, ordering and review of radiographic studies, pulse oximetry and re-evaluation of patient's condition.  Medications Ordered in ED Medications  lactated ringers  infusion ( Intravenous New Bag/Given 03/24/23 1646)  lactated  ringers  bolus 1,000 mL (1,000 mLs Intravenous New Bag/Given 03/24/23 1636)    And  lactated ringers  bolus 1,000 mL (1,000 mLs Intravenous New Bag/Given 03/24/23 1643)    And  lactated ringers  bolus 1,000 mL (1,000 mLs Intravenous New Bag/Given 03/24/23 1702)  azithromycin  (ZITHROMAX ) 500 mg in sodium chloride  0.9 % 250 mL IVPB (500 mg Intravenous New Bag/Given 03/24/23 1646)  cefTRIAXone  (ROCEPHIN ) 2 g in sodium chloride  0.9 % 100 mL IVPB (2 g Intravenous New Bag/Given 03/24/23 1640)  fentaNYL  (SUBLIMAZE ) injection 50 mcg (50 mcg Intravenous Given 03/24/23 1643)    ED Course/ Medical Decision Making/ A&P                                 Medical Decision Making Amount and/or Complexity of Data Reviewed Labs: ordered. Radiology: ordered.  Risk Prescription drug management.    SALATHIEL FERRARA is a 78 y.o. male with a past medical history significant for hypertension, hyperlipidemia, CAD status post CABG, CKD, esophageal stricture, and previous total left hip arthroplasty who presents with pneumonia, reported norovirus, and now falls.  According to patient and nursing report of EMS, patient currently has pneumonia norovirus and has had multiple falls today.  Patient is complaining of headache, neck pain, back pain, chest pain, and abdominal pain.  He reports he has been having the GI symptoms with nausea vomiting, diarrhea as well.  He then said he had several falls today falling forward as well as backward although he did not get knocked out he says.  Patient brought in with concern for sepsis given tachycardia tachypnea and warm to the touch.   On initial evaluation, breath sounds are coarse bilaterally and chest is tender.  He is diffusely tender on his abdomen.  Diffusely tender on his neck and on his back.  He has intact sensation and strength in extremities and has pupils are symmetric with intact extraocular movements.  Speech is clear.  He is in a cervical immobilization collar.  On my  evaluation his oxygen  saturation was 81% on room air.  I put him on 2 L and he is now on 6 L.  Given his tachycardia, tachypnea, hypoxia, no pneumonia by report, and his temperature of 99.5 orally, will get a rectal temperature and will start a code sepsis workup.  With his reported falls and having such tenderness all over, will order CT scans of the head, neck, and chest/abdomen/pelvis.  He has a contrast allergy so we will do without contrast.  Due to his sepsis and hypoxia, he will need admission.  Will get rest of sepsis workup and trauma workup and he will be admitted after workup is completed.  Will give a dose of pain medicine initially as well.  CT imaging did not show evidence of significant traumatic injuries.  Collar was removed.  His head, neck, chest/abdomen/pelvis  did not show bony injury.  He has an atelectasis in the lungs which could be pneumonia given the clinical context of sepsis and the no pneumonia already.  His labs are still in process so he will be signed out to Dr. Evalyn while awaiting labs but when they are completed, he will need admission for sepsis with hypoxia in the setting of several falls without significant traumatic injuries.          Final Clinical Impression(s) / ED Diagnoses Final diagnoses:  Hypoxia  Sepsis, due to unspecified organism, unspecified whether acute organ dysfunction present (HCC)  Falls   \  Clinical Impression: 1. Hypoxia   2. Sepsis, due to unspecified organism, unspecified whether acute organ dysfunction present (HCC)   3. Falls     Disposition: Admit  This note was prepared with assistance of Dragon voice recognition software. Occasional wrong-word or sound-a-like substitutions may have occurred due to the inherent limitations of voice recognition software.     Kalia Vahey, Lonni PARAS, MD 03/24/23 1714

## 2023-03-24 NOTE — Sepsis Progress Note (Signed)
 Notified provider of need to order repeat lactic acid.

## 2023-03-24 NOTE — ED Notes (Signed)
 Patient transported to CT

## 2023-03-24 NOTE — ED Triage Notes (Signed)
 Patient brought by EMS from home with c/o pneumonia, noro virus and 2 falls. Per Ems patient wife witnessed falls but patient stated patient did not injure himself. He does c/o neck and back pain and chest pain when coughing and breathing in.  105 99% 2L 38 98.7

## 2023-03-25 ENCOUNTER — Encounter (HOSPITAL_COMMUNITY): Payer: Self-pay | Admitting: Internal Medicine

## 2023-03-25 DIAGNOSIS — R6520 Severe sepsis without septic shock: Secondary | ICD-10-CM

## 2023-03-25 DIAGNOSIS — Z9861 Coronary angioplasty status: Secondary | ICD-10-CM

## 2023-03-25 DIAGNOSIS — N179 Acute kidney failure, unspecified: Secondary | ICD-10-CM | POA: Diagnosis not present

## 2023-03-25 DIAGNOSIS — R579 Shock, unspecified: Secondary | ICD-10-CM | POA: Diagnosis not present

## 2023-03-25 DIAGNOSIS — E2740 Unspecified adrenocortical insufficiency: Secondary | ICD-10-CM

## 2023-03-25 DIAGNOSIS — A419 Sepsis, unspecified organism: Secondary | ICD-10-CM

## 2023-03-25 DIAGNOSIS — I2510 Atherosclerotic heart disease of native coronary artery without angina pectoris: Secondary | ICD-10-CM | POA: Diagnosis not present

## 2023-03-25 LAB — CBC WITH DIFFERENTIAL/PLATELET
Abs Immature Granulocytes: 0.01 10*3/uL (ref 0.00–0.07)
Basophils Absolute: 0 10*3/uL (ref 0.0–0.1)
Basophils Relative: 0 %
Eosinophils Absolute: 0 10*3/uL (ref 0.0–0.5)
Eosinophils Relative: 1 %
HCT: 35.7 % — ABNORMAL LOW (ref 39.0–52.0)
Hemoglobin: 11.8 g/dL — ABNORMAL LOW (ref 13.0–17.0)
Immature Granulocytes: 0 %
Lymphocytes Relative: 12 %
Lymphs Abs: 0.5 10*3/uL — ABNORMAL LOW (ref 0.7–4.0)
MCH: 32.4 pg (ref 26.0–34.0)
MCHC: 33.1 g/dL (ref 30.0–36.0)
MCV: 98.1 fL (ref 80.0–100.0)
Monocytes Absolute: 0.2 10*3/uL (ref 0.1–1.0)
Monocytes Relative: 6 %
Neutro Abs: 3.1 10*3/uL (ref 1.7–7.7)
Neutrophils Relative %: 81 %
Platelets: 166 10*3/uL (ref 150–400)
RBC: 3.64 MIL/uL — ABNORMAL LOW (ref 4.22–5.81)
RDW: 15.6 % — ABNORMAL HIGH (ref 11.5–15.5)
WBC: 3.8 10*3/uL — ABNORMAL LOW (ref 4.0–10.5)
nRBC: 0 % (ref 0.0–0.2)

## 2023-03-25 LAB — URINALYSIS, W/ REFLEX TO CULTURE (INFECTION SUSPECTED)
Bacteria, UA: NONE SEEN
Bilirubin Urine: NEGATIVE
Glucose, UA: NEGATIVE mg/dL
Hgb urine dipstick: NEGATIVE
Ketones, ur: NEGATIVE mg/dL
Leukocytes,Ua: NEGATIVE
Nitrite: NEGATIVE
Protein, ur: NEGATIVE mg/dL
Specific Gravity, Urine: 1.018 (ref 1.005–1.030)
pH: 5 (ref 5.0–8.0)

## 2023-03-25 LAB — BLOOD CULTURE ID PANEL (REFLEXED) - BCID2

## 2023-03-25 LAB — HEPATIC FUNCTION PANEL
ALT: 18 U/L (ref 0–44)
AST: 30 U/L (ref 15–41)
Albumin: 2.5 g/dL — ABNORMAL LOW (ref 3.5–5.0)
Alkaline Phosphatase: 30 U/L — ABNORMAL LOW (ref 38–126)
Bilirubin, Direct: 0.2 mg/dL (ref 0.0–0.2)
Indirect Bilirubin: 0.7 mg/dL (ref 0.3–0.9)
Total Bilirubin: 0.9 mg/dL (ref 0.0–1.2)
Total Protein: 4.7 g/dL — ABNORMAL LOW (ref 6.5–8.1)

## 2023-03-25 LAB — BASIC METABOLIC PANEL
Anion gap: 8 (ref 5–15)
BUN: 28 mg/dL — ABNORMAL HIGH (ref 8–23)
CO2: 20 mmol/L — ABNORMAL LOW (ref 22–32)
Calcium: 8.1 mg/dL — ABNORMAL LOW (ref 8.9–10.3)
Chloride: 104 mmol/L (ref 98–111)
Creatinine, Ser: 1.27 mg/dL — ABNORMAL HIGH (ref 0.61–1.24)
GFR, Estimated: 58 mL/min — ABNORMAL LOW (ref >60)
Glucose, Bld: 127 mg/dL — ABNORMAL HIGH (ref 70–99)
Potassium: 3.8 mmol/L (ref 3.5–5.1)
Sodium: 132 mmol/L — ABNORMAL LOW (ref 135–145)

## 2023-03-25 LAB — MAGNESIUM: Magnesium: 2.1 mg/dL (ref 1.7–2.4)

## 2023-03-25 LAB — MRSA NEXT GEN BY PCR, NASAL: MRSA by PCR Next Gen: NOT DETECTED

## 2023-03-25 MED ORDER — ONDANSETRON HCL 4 MG/2ML IJ SOLN
4.0000 mg | Freq: Four times a day (QID) | INTRAMUSCULAR | Status: DC | PRN
  Administered 2023-03-27: 4 mg via INTRAVENOUS
  Filled 2023-03-25 (×2): qty 2

## 2023-03-25 MED ORDER — METRONIDAZOLE 500 MG/100ML IV SOLN
500.0000 mg | Freq: Two times a day (BID) | INTRAVENOUS | Status: DC
  Administered 2023-03-25: 500 mg via INTRAVENOUS
  Filled 2023-03-25: qty 100

## 2023-03-25 MED ORDER — SORBITOL 70 % SOLN: 30.0000 mL | Freq: Every day | Status: DC | PRN

## 2023-03-25 MED ORDER — ACETAMINOPHEN 650 MG RE SUPP: 650.0000 mg | Freq: Four times a day (QID) | RECTAL | Status: DC | PRN

## 2023-03-25 MED ORDER — SODIUM CHLORIDE 0.9 % IV SOLN
2.0000 g | Freq: Two times a day (BID) | INTRAVENOUS | Status: DC
  Administered 2023-03-25: 2 g via INTRAVENOUS
  Filled 2023-03-25: qty 12.5

## 2023-03-25 MED ORDER — HYDROCORTISONE 10 MG PO TABS
10.0000 mg | ORAL_TABLET | Freq: Every day | ORAL | Status: DC
  Administered 2023-03-26 – 2023-03-27 (×2): 10 mg via ORAL
  Filled 2023-03-25 (×2): qty 1

## 2023-03-25 MED ORDER — CHLORHEXIDINE GLUCONATE CLOTH 2 % EX PADS
6.0000 | MEDICATED_PAD | Freq: Every day | CUTANEOUS | Status: DC
  Administered 2023-03-25 – 2023-03-26 (×2): 6 via TOPICAL

## 2023-03-25 MED ORDER — VANCOMYCIN HCL IN DEXTROSE 1-5 GM/200ML-% IV SOLN: 1000.0000 mg | INTRAVENOUS | Status: DC

## 2023-03-25 MED ORDER — MIDODRINE HCL 5 MG PO TABS
2.5000 mg | ORAL_TABLET | Freq: Three times a day (TID) | ORAL | Status: DC
Start: 2023-03-25 — End: 2023-03-27
  Administered 2023-03-25: 2.5 mg via ORAL
  Filled 2023-03-25 (×2): qty 1

## 2023-03-25 MED ORDER — LOPERAMIDE HCL 2 MG PO CAPS
2.0000 mg | ORAL_CAPSULE | Freq: Once | ORAL | Status: AC
  Administered 2023-03-25: 2 mg via ORAL
  Filled 2023-03-25: qty 1

## 2023-03-25 MED ORDER — MIDODRINE HCL 5 MG PO TABS
5.0000 mg | ORAL_TABLET | Freq: Three times a day (TID) | ORAL | Status: DC
  Administered 2023-03-25: 5 mg via ORAL
  Filled 2023-03-25 (×2): qty 1

## 2023-03-25 MED ORDER — ACETAMINOPHEN 325 MG PO TABS
650.0000 mg | ORAL_TABLET | Freq: Four times a day (QID) | ORAL | Status: DC | PRN
  Administered 2023-03-25 – 2023-03-27 (×2): 650 mg via ORAL
  Filled 2023-03-25 (×3): qty 2

## 2023-03-25 MED ORDER — ENOXAPARIN SODIUM 40 MG/0.4ML IJ SOSY
40.0000 mg | PREFILLED_SYRINGE | INTRAMUSCULAR | Status: DC
  Administered 2023-03-25 – 2023-03-26 (×2): 40 mg via SUBCUTANEOUS
  Filled 2023-03-25 (×2): qty 0.4

## 2023-03-25 MED ORDER — HYDROCORTISONE 10 MG PO TABS
5.0000 mg | ORAL_TABLET | Freq: Every day | ORAL | Status: DC
  Administered 2023-03-25 – 2023-03-26 (×2): 5 mg via ORAL
  Filled 2023-03-25 (×2): qty 1

## 2023-03-25 MED ORDER — ENOXAPARIN SODIUM 30 MG/0.3ML IJ SOSY: 30.0000 mg | PREFILLED_SYRINGE | INTRAMUSCULAR | Status: DC

## 2023-03-25 MED ORDER — VANCOMYCIN HCL 1.5 G IV SOLR
1500.0000 mg | Freq: Once | INTRAVENOUS | Status: DC
  Filled 2023-03-25: qty 30

## 2023-03-25 MED ORDER — ONDANSETRON HCL 4 MG PO TABS
4.0000 mg | ORAL_TABLET | Freq: Four times a day (QID) | ORAL | Status: DC | PRN
  Administered 2023-03-25: 4 mg via ORAL
  Filled 2023-03-25: qty 1

## 2023-03-25 MED ORDER — VANCOMYCIN HCL 1500 MG/300ML IV SOLN
1500.0000 mg | Freq: Once | INTRAVENOUS | Status: AC
  Administered 2023-03-25: 1500 mg via INTRAVENOUS
  Filled 2023-03-25: qty 300

## 2023-03-25 NOTE — Assessment & Plan Note (Addendum)
-   Febrile, tachycardia, tachypnea; source suspected viral GI illness -Continue fluids and supportive care - Discontinue antibiotics

## 2023-03-25 NOTE — Plan of Care (Signed)
  Problem: Fluid Volume: Goal: Hemodynamic stability will improve Outcome: Progressing   Problem: Respiratory: Goal: Ability to maintain adequate ventilation will improve Outcome: Progressing   Problem: Clinical Measurements: Goal: Will remain free from infection Outcome: Progressing Goal: Cardiovascular complication will be avoided Outcome: Progressing   Problem: Safety: Goal: Ability to remain free from injury will improve Outcome: Progressing

## 2023-03-25 NOTE — Progress Notes (Signed)
 Progress Note    [name]   ZOX:[id number]  DOB: October 21, 1945  DOA: 03/24/2023     1 PCP: Suzzanne Estrin, MD  Initial CC: N/V/D  Hospital Course: [name] is a 78 y.o. male with medical history significant for CAD s/p stent placement and adrenal insufficiency on hydrocortisone  who presented with N/V/D.  There has been norovirus in the house recently with multiple sick contacts exposure.  His symptoms became worse the day prior to admission and therefore he was brought to the hospital. He was found to be volume depleted/dehydrated and hypotensive.  He was also found to be febrile.  He was started on IV fluids and admitted for further workup and monitoring.  Interval History:  Feeling a little bit better this morning.  Wife present bedside.  He was amenable with attempting a diet. Vitals have improved since admission.  Assessment and Plan: * Sepsis (HCC) - Febrile, tachycardia, tachypnea; source suspected viral GI illness - hold off on stool testing unless no improvement  -Continue fluids and supportive care - Discontinue antibiotics  AKI (acute kidney injury) (HCC) - baseline creatinine ~ 1.1 - patient presents with increase in creat >0.3 mg/dL above baseline or creat increase >1.5x baseline presumed to have occurred within past 7 days PTA - creat 1.83 on admission, presumed volume depletion from GI losses - Responded well to fluids -Continue fluids 1 more day and encouraging diet  Adrenal insufficiency (HCC) Hypotensive on admission and started on stress dose steroids - Pressure now improved.  Transition back to home steroid dosing  Hypomagnesemia - Replete as needed  CAD S/P percutaneous coronary angioplasty - Continue aspirin  - Beta-blocker and statin on hold  Hyperlipidemia - Statin on hold for now   Old records reviewed in assessment of this patient  Antimicrobials:   DVT prophylaxis:  enoxaparin  (LOVENOX ) injection 40 mg Start: 03/25/23  1000   Code Status:   Code Status: Full Code  Mobility Assessment (Last 72 Hours)     Mobility Assessment   No documentation.           Barriers to discharge: none Disposition Plan:  Home HH orders placed:  Status is: Inpt  Objective: Blood pressure 123/61, pulse 68, temperature 97.9 F (36.6 C), temperature source Oral, resp. rate (!) 21, height 5\' 7"  (1.702 m), weight 83.9 kg, SpO2 97%.  Examination:  Physical Exam Constitutional:      General: He is not in acute distress.    Appearance: Normal appearance.  HENT:     Head: Normocephalic and atraumatic.     Mouth/Throat:     Mouth: Mucous membranes are moist.  Eyes:     Extraocular Movements: Extraocular movements intact.  Cardiovascular:     Rate and Rhythm: Normal rate and regular rhythm.  Pulmonary:     Effort: Pulmonary effort is normal. No respiratory distress.     Breath sounds: Normal breath sounds. No wheezing.  Abdominal:     General: Bowel sounds are normal. There is no distension.     Palpations: Abdomen is soft.     Tenderness: There is no abdominal tenderness.  Musculoskeletal:        General: Normal range of motion.     Cervical back: Normal range of motion and neck supple.  Skin:    General: Skin is warm and dry.  Neurological:     General: No focal deficit present.     Mental Status: He is alert.  Psychiatric:  Mood and Affect: Mood normal.        Behavior: Behavior normal.      Consultants:    Procedures:    Data Reviewed: Results for orders placed or performed during the hospital encounter of 03/24/23 (from the past 24 hours)  Resp panel by RT-PCR (RSV, Flu A&B, Covid) Anterior Nasal Swab     Status: None   Collection Time: 03/24/23  4:08 PM   Specimen: Anterior Nasal Swab  Result Value Ref Range   SARS Coronavirus 2 by RT PCR NEGATIVE NEGATIVE   Influenza A by PCR NEGATIVE NEGATIVE   Influenza B by PCR NEGATIVE NEGATIVE   Resp Syncytial Virus by PCR NEGATIVE NEGATIVE   Blood Culture (routine x 2)     Status: None (Preliminary result)   Collection Time: 03/24/23  4:13 PM   Specimen: BLOOD  Result Value Ref Range   Specimen Description      BLOOD BLOOD RIGHT ARM Performed at [facility], [address], [place], [place] [address]    Special Requests      BOTTLES DRAWN AEROBIC AND ANAEROBIC Blood Culture results may not be optimal due to an inadequate volume of blood received in culture bottles Performed at [facility], [address], [place], [place] [address]    Culture      NO GROWTH < 12 HOURS Performed at [facility], [address], [place], [place] [address]    Report Status PENDING   Blood Culture (routine x 2)     Status: None (Preliminary result)   Collection Time: 03/24/23  4:13 PM   Specimen: BLOOD  Result Value Ref Range   Specimen Description      BLOOD LEFT ANTECUBITAL Performed at [facility], [address], [place], [place] [address]    Special Requests      BOTTLES DRAWN AEROBIC AND ANAEROBIC Blood Culture results may not be optimal due to an inadequate volume of blood received in culture bottles Performed at [facility], [address] [place], [place] [address]    Culture      NO GROWTH < 12 HOURS Performed at [facility], [address], [place], [place] [address]    Report Status PENDING   Comprehensive metabolic panel     Status: Abnormal   Collection Time: 03/24/23  4:14 PM  Result Value Ref Range   Sodium 136 135 - 145 mmol/L   Potassium 4.1 3.5 - 5.1 mmol/L   Chloride 108 98 - 111 mmol/L   CO2 19 (L) 22 - 32 mmol/L   Glucose, Bld 172 (H) 70 - 99 mg/dL   BUN 49 (H) 8 - 23 mg/dL   Creatinine, Ser 5.78 (H) 0.61 - 1.24 mg/dL   Calcium  8.7 (L) 8.9 - 10.3 mg/dL   Total Protein 6.6 6.5 - 8.1 g/dL   Albumin  3.7 3.5 - 5.0 g/dL   AST 31 15 - 41 U/L   ALT 21 0 - 44 U/L   Alkaline Phosphatase 43 38 - 126 U/L   Total  Bilirubin 1.8 (H) 0.0 - 1.2 mg/dL   GFR, Estimated 37 (L) >60 mL/min   Anion gap 9 5 - 15  CBC with Differential     Status: Abnormal   Collection Time: 03/24/23  4:14 PM  Result Value Ref Range   WBC 3.5 (L) 4.0 - 10.5 K/uL   RBC 4.36 4.22 - 5.81 MIL/uL   Hemoglobin 14.2  13.0 - 17.0 g/dL   HCT 16.1 09.6 - 04.5 %   MCV 99.5 80.0 - 100.0 fL   MCH 32.6 26.0 - 34.0 pg   MCHC 32.7 30.0 - 36.0 g/dL   RDW 40.9 81.1 - 91.4 %   Platelets 216 150 - 400 K/uL   nRBC 0.0 0.0 - 0.2 %   Neutrophils Relative % 91 %   Neutro Abs 3.2 1.7 - 7.7 K/uL   Lymphocytes Relative 7 %   Lymphs Abs 0.3 (L) 0.7 - 4.0 K/uL   Monocytes Relative 2 %   Monocytes Absolute 0.1 0.1 - 1.0 K/uL   Eosinophils Relative 0 %   Eosinophils Absolute 0.0 0.0 - 0.5 K/uL   Basophils Relative 0 %   Basophils Absolute 0.0 0.0 - 0.1 K/uL   Immature Granulocytes 0 %   Abs Immature Granulocytes 0.01 0.00 - 0.07 K/uL  Protime-INR     Status: None   Collection Time: 03/24/23  4:14 PM  Result Value Ref Range   Prothrombin Time 14.9 11.4 - 15.2 seconds   INR 1.1 0.8 - 1.2  APTT     Status: None   Collection Time: 03/24/23  4:14 PM  Result Value Ref Range   aPTT 26 24 - 36 seconds  Lipase, blood     Status: None   Collection Time: 03/24/23  4:14 PM  Result Value Ref Range   Lipase 25 11 - 51 U/L  Troponin I (High Sensitivity)     Status: Abnormal   Collection Time: 03/24/23  4:14 PM  Result Value Ref Range   Troponin I (High Sensitivity) 22 (H) <18 ng/L  I-Stat Lactic Acid, ED     Status: Abnormal   Collection Time: 03/24/23  4:23 PM  Result Value Ref Range   Lactic Acid, Venous 4.4 (HH) 0.5 - 1.9 mmol/L   Comment NOTIFIED PHYSICIAN   Troponin I (High Sensitivity)     Status: Abnormal   Collection Time: 03/24/23  6:20 PM  Result Value Ref Range   Troponin I (High Sensitivity) 56 (H) <18 ng/L  I-Stat Lactic Acid, ED     Status: Abnormal   Collection Time: 03/24/23  6:27 PM  Result Value Ref Range   Lactic Acid,  Venous 3.9 (HH) 0.5 - 1.9 mmol/L   Comment NOTIFIED PHYSICIAN   Magnesium      Status: Abnormal   Collection Time: 03/24/23 10:05 PM  Result Value Ref Range   Magnesium  1.5 (L) 1.7 - 2.4 mg/dL  Basic metabolic panel     Status: Abnormal   Collection Time: 03/24/23 10:05 PM  Result Value Ref Range   Sodium 132 (L) 135 - 145 mmol/L   Potassium 4.4 3.5 - 5.1 mmol/L   Chloride 106 98 - 111 mmol/L   CO2 18 (L) 22 - 32 mmol/L   Glucose, Bld 112 (H) 70 - 99 mg/dL   BUN 45 (H) 8 - 23 mg/dL   Creatinine, Ser 7.82 (H) 0.61 - 1.24 mg/dL   Calcium  7.3 (L) 8.9 - 10.3 mg/dL   GFR, Estimated 41 (L) >60 mL/min   Anion gap 8 5 - 15  Hepatic function panel     Status: Abnormal   Collection Time: 03/24/23 10:05 PM  Result Value Ref Range   Total Protein 4.7 (L) 6.5 - 8.1 g/dL   Albumin  2.5 (L) 3.5 - 5.0 g/dL   AST 30 15 - 41 U/L   ALT 18 0 - 44 U/L   Alkaline Phosphatase  30 (L) 38 - 126 U/L   Total Bilirubin 0.9 0.0 - 1.2 mg/dL   Bilirubin, Direct 0.2 0.0 - 0.2 mg/dL   Indirect Bilirubin 0.7 0.3 - 0.9 mg/dL  MRSA Next Gen by PCR, Nasal     Status: None   Collection Time: 03/25/23  6:57 AM   Specimen: Nasal Mucosa; Nasal Swab  Result Value Ref Range   MRSA by PCR Next Gen NOT DETECTED NOT DETECTED  Basic metabolic panel     Status: Abnormal   Collection Time: 03/25/23 12:31 PM  Result Value Ref Range   Sodium 132 (L) 135 - 145 mmol/L   Potassium 3.8 3.5 - 5.1 mmol/L   Chloride 104 98 - 111 mmol/L   CO2 20 (L) 22 - 32 mmol/L   Glucose, Bld 127 (H) 70 - 99 mg/dL   BUN 28 (H) 8 - 23 mg/dL   Creatinine, Ser 8.11 (H) 0.61 - 1.24 mg/dL   Calcium  8.1 (L) 8.9 - 10.3 mg/dL   GFR, Estimated 58 (L) >60 mL/min   Anion gap 8 5 - 15  CBC with Differential/Platelet     Status: Abnormal   Collection Time: 03/25/23 12:31 PM  Result Value Ref Range   WBC 3.8 (L) 4.0 - 10.5 K/uL   RBC 3.64 (L) 4.22 - 5.81 MIL/uL   Hemoglobin 11.8 (L) 13.0 - 17.0 g/dL   HCT 91.4 (L) 78.2 - 95.6 %   MCV 98.1 80.0 -  100.0 fL   MCH 32.4 26.0 - 34.0 pg   MCHC 33.1 30.0 - 36.0 g/dL   RDW 21.3 (H) 08.6 - 57.8 %   Platelets 166 150 - 400 K/uL   nRBC 0.0 0.0 - 0.2 %   Neutrophils Relative % 81 %   Neutro Abs 3.1 1.7 - 7.7 K/uL   Lymphocytes Relative 12 %   Lymphs Abs 0.5 (L) 0.7 - 4.0 K/uL   Monocytes Relative 6 %   Monocytes Absolute 0.2 0.1 - 1.0 K/uL   Eosinophils Relative 1 %   Eosinophils Absolute 0.0 0.0 - 0.5 K/uL   Basophils Relative 0 %   Basophils Absolute 0.0 0.0 - 0.1 K/uL   Immature Granulocytes 0 %   Abs Immature Granulocytes 0.01 0.00 - 0.07 K/uL  Magnesium      Status: None   Collection Time: 03/25/23 12:31 PM  Result Value Ref Range   Magnesium  2.1 1.7 - 2.4 mg/dL    I have reviewed pertinent nursing notes, vitals, labs, and images as necessary. I have ordered labwork to follow up on as indicated.  I have reviewed the last notes from staff over past 24 hours. I have discussed patient's care plan and test results with nursing staff, CM/SW, and other staff as appropriate.  Time spent: Greater than 50% of the 55 minute visit was spent in counseling/coordination of care for the patient as laid out in the A&P.   LOS: 1 day   Faith Homes, MD [facility] 03/25/2023, 1:21 PM

## 2023-03-25 NOTE — Assessment & Plan Note (Signed)
 Hypotensive on admission and started on stress dose steroids - Pressure now improved.  Transition back to home steroid dosing

## 2023-03-25 NOTE — Assessment & Plan Note (Addendum)
-   Continue aspirin -Remainder home meds resumed at discharge

## 2023-03-25 NOTE — Assessment & Plan Note (Addendum)
-   baseline creatinine ~ 1.1 - patient presents with increase in creat >0.3 mg/dL above baseline or creat increase >1.5x baseline presumed to have occurred within past 7 days PTA - creat 1.83 on admission, presumed volume depletion from GI losses - Responded well to fluids

## 2023-03-25 NOTE — H&P (Addendum)
 History and Physical    Patient: [name] BJY:[id number] DOB: [birth year] DOA: 03/24/2023 DOS: the patient was seen and examined on 03/25/2023 PCP: Suzzanne Estrin, MD  Patient coming from: Home  Chief Complaint:  Chief Complaint  Patient presents with   Code Sepsis    Pneumonia and Noro virus   HPI: [name] is a 78 y.o. male with medical history significant for coronary artery disease and stent placement and adrenal insufficiency on hydrocortisone  twice daily who presents with sudden onset violent vomiting this morning. The patient was actually on doxycycline  because of upper respiratory symptoms from 5 days earlier.  The wife provides most of the history and she tells me that multiple family members have had norovirus and nausea and vomiting.  Patient was doing well over the last 3 to 4 days since starting on his antibiotics until today.  This morning he started with recurrent severe vomiting and ended up falling.  He the first time he fell he was able to get up with his wife but then he continued to vomit and on his way to the bathroom he fell again.  This time he could not get up.  They ended up calling 911.  In the emergency department the patient had a temperature of 102.  He had a CT of his head and C-spine because of his falls they were negative for any fracture or abnormality.  Patient did meet sepsis criteria with his fever and initial tachycardia as well as low white blood cell count.  Blood cultures were drawn and he was started on empiric antibiotics.  While in the emergency department his blood pressures dropped.  He did receive sepsis dose fluids and by the time of my evaluation he had completed 3 L of LR. His sbp was 79 when I saw him.   He continued to be hypotensive despite fluids so critical care did evaluate the patient in the emergency department.  They felt that he was still dehydrated and recommended more fluids rather than pressors. The patient will be admitted  to the hospitalist service.  He did receive stress dose steroids in the ER as well.     Review of Systems: unable to review all systems due to the inability of the patient to answer questions.  The patient's wife is an excellent historian. 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   Noted on EKG   Left axis deviation 05/2018   Noted on EKG   Lipoma 04/20/2018   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   Loop Biotronik Biomonitor III for syncope and collapse 04/09/2021 04/09/2021   LVH (left ventricular hypertrophy) 05/2018   Noted on EKG   Phimosis    Pneumonia    Syncope and collapse    Tuberculosis 1956   Wears glasses  Wheezing    Past Surgical History:  Procedure Laterality Date   ANGIOPLASTY  2010   3 stents placed   BACK SURGERY     CARDIAC CATHETERIZATION     CATARACT EXTRACTION W/ INTRAOCULAR LENS  IMPLANT, BILATERAL  2012   bilateral   CIRCUMCISION N/A 08/30/2018   Procedure: CIRCUMCISION ADULT;  Surgeon: Ottelin, Mark, MD;  Location: [facility];  Service: Urology;  Laterality: N/A;   COLONOSCOPY  2013, 2008   CORONARY ANGIOPLASTY     CORONARY ARTERY BYPASS GRAFT  2006   x4   CORONARY ATHERECTOMY N/A 12/24/2021   Procedure: CORONARY ATHERECTOMY;  Surgeon: Knox Perl, MD;  Location: [facility] INVASIVE CV LAB;  Service: Cardiovascular;  Laterality: N/A;   CORONARY BALLOON ANGIOPLASTY N/A 11/25/2022    Procedure: CORONARY BALLOON ANGIOPLASTY;  Surgeon: Knox Perl, MD;  Location: [facility] INVASIVE CV LAB;  Service: Cardiovascular;  Laterality: N/A;   CORONARY STENT INTERVENTION N/A 12/24/2021   Procedure: CORONARY STENT INTERVENTION;  Surgeon: Knox Perl, MD;  Location: [facility] INVASIVE CV LAB;  Service: Cardiovascular;  Laterality: N/A;   ESOPHAGOGASTRODUODENOSCOPY  10/2011   EYE SURGERY     KNEE ARTHROSCOPY  2001   LEFT HEART CATH AND CORS/GRAFTS ANGIOGRAPHY N/A 12/24/2021   Procedure: LEFT HEART CATH AND CORS/GRAFTS ANGIOGRAPHY;  Surgeon: Knox Perl, MD;  Location: [facility] INVASIVE CV LAB;  Service: Cardiovascular;  Laterality: N/A;   LEFT HEART CATH AND CORS/GRAFTS ANGIOGRAPHY N/A 11/25/2022   Procedure: LEFT HEART CATH AND CORS/GRAFTS ANGIOGRAPHY;  Surgeon: Knox Perl, MD;  Location: [facility] INVASIVE CV LAB;  Service: Cardiovascular;  Laterality: N/A;   LUMBAR DISC SURGERY  1998   LUMBAR LAMINECTOMY/DECOMPRESSION MICRODISCECTOMY N/A 07/13/2019   Procedure: Laminectomy and Foraminotomy - Lumbar Two-Lumbar Three - Lumbar Three-Lumbar Four;  Surgeon: Isadora Mar, MD;  Location: [facility] OR;  Service: Neurosurgery;  Laterality: N/A;  Laminectomy and Foraminotomy - Lumbar Two-Lumbar Three - Lumbar Three-Lumbar Four   ROTATOR CUFF REPAIR Left 05/24/2020   TENDON RECONSTRUCTION Right 10/27/2013   Procedure: RIGHT OPEN TRICEPS REPAIR;  Surgeon: Saundra Curl, MD;  Location: [facility];  Service: Orthopedics;  Laterality: Right;   TOTAL HIP ARTHROPLASTY Left 08/05/2022   Procedure: TOTAL HIP ARTHROPLASTY;  Surgeon: Osa Blase, MD;  Location: [facility] ORS;  Service: Orthopedics;  Laterality: Left;   Social History:  reports that he has never smoked. He has never used smokeless tobacco. He reports that he does not drink alcohol  and does not use drugs.  Allergies  Allergen Reactions   Codeine Nausea And Vomiting   Guaifenesin & Derivatives Nausea And Vomiting   Terbinafine And Related Other (See Comments)     Muscle pain   Iodinated Contrast Media Itching   Penicillins Rash    Tolerates Cefazolin  without problems...[employer]    Family History  Problem Relation Age of Onset   Colon cancer Mother 95   Hypertension Mother    Colon cancer Father 88   Heart attack Brother    Heart failure Brother    Stomach cancer Neg Hx     Prior to Admission medications   Medication Sig Start Date End Date Taking? Authorizing Provider  acetaminophen  (TYLENOL ) 500 MG tablet Take 1,000 mg by mouth every 6 (six) hours as needed for moderate pain.   Yes [provider]  aspirin  81 MG chewable tablet Chew 81 mg by mouth every evening.   Yes [provider]  benzonatate  (TESSALON ) 100 MG capsule Take 1 capsule (100 mg total)  by mouth 3 (three) times daily as needed for cough. 02/16/23  Yes Buena Carmine, NP  Calcium  Carbonate Antacid (TUMS ULTRA PO) Take 2 tablets by mouth daily as needed (acid reflux/indigestion.).   Yes [provider]  Coenzyme Q10 (COQ10 PO) Take 300 mg by mouth 2 (two) times daily.   Yes [provider]  Cyanocobalamin  (VITAMIN B-12) 5000 MCG TBDP Take 5,000 mcg by mouth every evening. 12/07/20  Yes [provider]  ezetimibe  (ZETIA ) 10 MG tablet TAKE 1/2 TABLET BY MOUTH EVERY EVENING 08/21/21  Yes Knox Perl, MD  ferrous sulfate  325 (65 FE) MG tablet Take 1 tablet (325 mg total) by mouth daily with breakfast. 12/03/22  Yes Regalado, Belkys A, MD  fluticasone  (FLONASE ) 50 MCG/ACT nasal spray Place 1 spray into both nostrils daily. 12/02/22  Yes Regalado, Belkys A, MD  hydrALAZINE  (APRESOLINE ) 25 MG tablet Take 25 mg by mouth 3 (three) times daily as needed (hypertension).   Yes [provider]  hydrocortisone  (CORTEF ) 10 MG tablet Take 1 tablet (10 mg total) by mouth in the morning. Patient taking differently: Take 10 mg by mouth in the morning. Take in addition to 5mg  in the afternoon for a total daily dose of 15mg  12/02/22  Yes Regalado, Belkys  A, MD  hydrocortisone  (CORTEF ) 5 MG tablet Take 1 tablet (5 mg total) by mouth daily in the afternoon. Patient taking differently: Take 5 mg by mouth daily in the afternoon. Take in addition to 10mg  in the morning for a total daily dose of 15mg  12/02/22  Yes Regalado, Belkys A, MD  ipratropium-albuterol  (DUONEB) 0.5-2.5 (3) MG/3ML SOLN Take 3 mLs by nebulization every 6 (six) hours as needed (wheezing or shortness of breath). 02/16/23  Yes Buena Carmine, NP  loperamide  (IMODIUM ) 2 MG capsule Take 2 mg by mouth as needed for diarrhea or loose stools.   Yes [provider]  metoprolol  succinate (TOPROL -XL) 50 MG 24 hr tablet Take 1 tablet (50 mg total) by mouth daily. Take with or immediately following a meal. 02/04/23 05/05/23 Yes Camnitz, Babetta Lesch, MD  midodrine  (PROAMATINE ) 2.5 MG tablet Take 1 tablet (2.5 mg total) by mouth 3 (three) times daily with meals as needed (SBP less than 100). 12/02/22  Yes Regalado, Belkys A, MD  Multiple Vitamin (MULTIVITAMIN) tablet Take 1 tablet by mouth in the morning. Seniors   Yes [provider]  Multiple Vitamins-Minerals (ICAPS AREDS 2 PO) Take 1 capsule by mouth daily.   Yes [provider]  nitroGLYCERIN  (NITROSTAT ) 0.4 MG SL tablet DISSOLVE 1 TABLET UNDER THE TONGUE AS NEEDED FOR CHEST PAIN EVERY 5 MINUTES UP TO 3 TIMES. IF NO RELIEF CALL 911. 01/28/23  Yes Knox Perl, MD  OMEGA-3 FATTY ACIDS  PO Take 1,200 mg by mouth in the morning and at bedtime.   Yes [provider]  pantoprazole  (PROTONIX ) 40 MG tablet Take 1 tablet (40 mg total) by mouth daily. 03/17/16  Yes Elgergawy, Ardia Kraft, MD  Polyethyl Glycol-Propyl Glycol (SYSTANE) 0.4-0.3 % GEL ophthalmic gel Place 1 application into both eyes at bedtime.   Yes [provider]  PRESCRIPTION MEDICATION Place 1 Dose into the left eye every 14 (fourteen) days. Injection for diabetic retinopathy. Unknown drug/strength.   Yes [provider]  Propylene Glycol  (SYSTANE BALANCE) 0.6 % SOLN Place 1 drop into both eyes 3 (three) times daily as needed (dry/irritated eyes.).   Yes [provider]  pyridoxine  (B-6) 100 MG tablet Take 100 mg by  mouth every evening.   Yes [provider]  ranolazine  (RANEXA ) 1000 MG SR tablet Take 1 tablet (1,000 mg total) by mouth daily. 03/06/23  Yes Knox Perl, MD  rosuvastatin  (CRESTOR ) 20 MG tablet TAKE 1 TABLET BY MOUTH EVERY DAY AT BEDTIME 12/26/22  Yes Knox Perl, MD  sennosides-docusate sodium  (SENOKOT-S) 8.6-50 MG tablet Take 1 tablet by mouth daily as needed for constipation.   Yes [provider]  traMADol  (ULTRAM ) 50 MG tablet Take 50 mg by mouth 3 (three) times daily as needed for moderate pain. 10/20/22  Yes [provider]  triamterene -hydrochlorothiazide  (DYAZIDE ) 37.5-25 MG capsule Take 1 capsule by mouth every other day.   Yes [provider]  trimethoprim-polymyxin b (POLYTRIM) ophthalmic solution Place 1 drop into the left eye See admin instructions. Instill 1 drop in left eye four times daily for 2 days after monthly eye injection 02/27/23  Yes [provider]  vitamin C  (ASCORBIC ACID ) 500 MG tablet Take 500 mg by mouth daily.   Yes [provider]    Physical Exam: Vitals:   03/24/23 2206 03/24/23 2220 03/24/23 2330 03/25/23 0000  BP:  (!) 144/53 (!) 86/47 (!) 92/49  Pulse:  85 71 70  Resp:      Temp: 98.8 F (37.1 C)     TempSrc: Oral     SpO2:  94% 94% 97%  Weight:      Height:       Physical Exam:  General: Ill appearing, well developed, well nourished HEENT: Normocephalic, atraumatic, PERRL Cardiovascular: Normal rate and rhythm. Distal pulses faint Pulmonary: Normal pulmonary effort, normal breath sounds Gastrointestinal: Distended abdomen, non-tender, normoactive bowel sounds Musculoskeletal:Normal ROM, no lower ext edema Skin: Skin is warm and dry. Neuro: No focal deficits noted, AAOx3. PSYCH: Attentive and  cooperative  Data Reviewed:  Results for orders placed or performed during the hospital encounter of 03/24/23 (from the past 24 hours)  Resp panel by RT-PCR (RSV, Flu A&B, Covid) Anterior Nasal Swab     Status: None   Collection Time: 03/24/23  4:08 PM   Specimen: Anterior Nasal Swab  Result Value Ref Range   SARS Coronavirus 2 by RT PCR NEGATIVE NEGATIVE   Influenza A by PCR NEGATIVE NEGATIVE   Influenza B by PCR NEGATIVE NEGATIVE   Resp Syncytial Virus by PCR NEGATIVE NEGATIVE  Comprehensive metabolic panel     Status: Abnormal   Collection Time: 03/24/23  4:14 PM  Result Value Ref Range   Sodium 136 135 - 145 mmol/L   Potassium 4.1 3.5 - 5.1 mmol/L   Chloride 108 98 - 111 mmol/L   CO2 19 (L) 22 - 32 mmol/L   Glucose, Bld 172 (H) 70 - 99 mg/dL   BUN 49 (H) 8 - 23 mg/dL   Creatinine, Ser 1.61 (H) 0.61 - 1.24 mg/dL   Calcium  8.7 (L) 8.9 - 10.3 mg/dL   Total Protein 6.6 6.5 - 8.1 g/dL   Albumin  3.7 3.5 - 5.0 g/dL   AST 31 15 - 41 U/L   ALT 21 0 - 44 U/L   Alkaline Phosphatase 43 38 - 126 U/L   Total Bilirubin 1.8 (H) 0.0 - 1.2 mg/dL   GFR, Estimated 37 (L) >60 mL/min   Anion gap 9 5 - 15  CBC with Differential     Status: Abnormal   Collection Time: 03/24/23  4:14 PM  Result Value Ref Range   WBC 3.5 (L) 4.0 - 10.5 K/uL   RBC  4.36 4.22 - 5.81 MIL/uL   Hemoglobin 14.2 13.0 - 17.0 g/dL   HCT 29.5 62.1 - 30.8 %   MCV 99.5 80.0 - 100.0 fL   MCH 32.6 26.0 - 34.0 pg   MCHC 32.7 30.0 - 36.0 g/dL   RDW 65.7 84.6 - 96.2 %   Platelets 216 150 - 400 K/uL   nRBC 0.0 0.0 - 0.2 %   Neutrophils Relative % 91 %   Neutro Abs 3.2 1.7 - 7.7 K/uL   Lymphocytes Relative 7 %   Lymphs Abs 0.3 (L) 0.7 - 4.0 K/uL   Monocytes Relative 2 %   Monocytes Absolute 0.1 0.1 - 1.0 K/uL   Eosinophils Relative 0 %   Eosinophils Absolute 0.0 0.0 - 0.5 K/uL   Basophils Relative 0 %   Basophils Absolute 0.0 0.0 - 0.1 K/uL   Immature Granulocytes 0 %   Abs Immature Granulocytes 0.01 0.00 - 0.07 K/uL   Protime-INR     Status: None   Collection Time: 03/24/23  4:14 PM  Result Value Ref Range   Prothrombin Time 14.9 11.4 - 15.2 seconds   INR 1.1 0.8 - 1.2  APTT     Status: None   Collection Time: 03/24/23  4:14 PM  Result Value Ref Range   aPTT 26 24 - 36 seconds  Lipase, blood     Status: None   Collection Time: 03/24/23  4:14 PM  Result Value Ref Range   Lipase 25 11 - 51 U/L  Troponin I (High Sensitivity)     Status: Abnormal   Collection Time: 03/24/23  4:14 PM  Result Value Ref Range   Troponin I (High Sensitivity) 22 (H) <18 ng/L  I-Stat Lactic Acid, ED     Status: Abnormal   Collection Time: 03/24/23  4:23 PM  Result Value Ref Range   Lactic Acid, Venous 4.4 (HH) 0.5 - 1.9 mmol/L   Comment NOTIFIED PHYSICIAN   Troponin I (High Sensitivity)     Status: Abnormal   Collection Time: 03/24/23  6:20 PM  Result Value Ref Range   Troponin I (High Sensitivity) 56 (H) <18 ng/L  I-Stat Lactic Acid, ED     Status: Abnormal   Collection Time: 03/24/23  6:27 PM  Result Value Ref Range   Lactic Acid, Venous 3.9 (HH) 0.5 - 1.9 mmol/L   Comment NOTIFIED PHYSICIAN   Magnesium      Status: Abnormal   Collection Time: 03/24/23 10:05 PM  Result Value Ref Range   Magnesium  1.5 (L) 1.7 - 2.4 mg/dL  Basic metabolic panel     Status: Abnormal   Collection Time: 03/24/23 10:05 PM  Result Value Ref Range   Sodium 132 (L) 135 - 145 mmol/L   Potassium 4.4 3.5 - 5.1 mmol/L   Chloride 106 98 - 111 mmol/L   CO2 18 (L) 22 - 32 mmol/L   Glucose, Bld 112 (H) 70 - 99 mg/dL   BUN 45 (H) 8 - 23 mg/dL   Creatinine, Ser 9.52 (H) 0.61 - 1.24 mg/dL   Calcium  7.3 (L) 8.9 - 10.3 mg/dL   GFR, Estimated 41 (L) >60 mL/min   Anion gap 8 5 - 15     Assessment and Plan: Sepsis / Shock, multifactorial likely due volume depletion, adrenal insufficiency +/- sepsis / Viral illness suspected - Patient has received 5 L of IV fluids so far - Viral illness is suspected but will empirically treat with IV  vancomycin  and cefepime  - Consider CT  of abdomen and chest to rule out bacterial source. - Blood cultures pending   2. Hypomagnesemia- replete  3. Adrenal insufficiency-the patient is on hydrocortisone  twice daily and is following with Dr. Jesse Moritz of endocrinology.  The patient's wife tells me the plan is to wean him off of the hydrocortisone  with but that has not happened yet.  4. CAD -continue aspirin .  Will hold beta-blocker and statin at this time    Than 1 hour was spent on patient care, counseling, and coordination of care.   Advance Care Planning:   Code Status: Prior   Consults: Critical care  Family Communication: The patient's wife at bedside  Severity of Illness: The appropriate patient status for this patient is INPATIENT. Inpatient status is judged to be reasonable and necessary in order to provide the required intensity of service to ensure the patient's safety. The patient's presenting symptoms, physical exam findings, and initial radiographic and laboratory data in the context of their chronic comorbidities is felt to place them at high risk for further clinical deterioration. Furthermore, it is not anticipated that the patient will be medically stable for discharge from the hospital within 2 midnights of admission.   * I certify that at the point of admission it is my clinical judgment that the patient will require inpatient hospital care spanning beyond 2 midnights from the point of admission due to high intensity of service, high risk for further deterioration and high frequency of surveillance required.*  Author: Willadean Hark, MD 03/25/2023 12:32 AM  For on call review [email or web address].

## 2023-03-25 NOTE — Progress Notes (Signed)
 PHARMACY - PHYSICIAN COMMUNICATION CRITICAL VALUE ALERT - BLOOD CULTURE IDENTIFICATION (BCID)  [name] is an 78 y.o. male who presented to [facility] on 03/24/2023 with a chief complaint of fall.  Assessment:  Patient with viral illness (suspected norovirus due to close contacts with same).  Antibiotics d/c'd earlier today.  Remains afebrile.  Anaerobic bottle of 1 blood cx set + GPCC; BCID + Staph epi with no resistance.  Suspect contaminant.   Name of physician (or Provider) Contacted: Eugena Herter, NP  Current antibiotics: none  Changes to prescribed antibiotics recommended:  Continue to observe off antibiotics. Follow culture data and clinical course.   Results for orders placed or performed during the hospital encounter of 03/24/23  Blood Culture ID Panel (Reflexed) (Collected: 03/24/2023  4:13 PM)  Result Value Ref Range   Enterococcus faecalis NOT DETECTED NOT DETECTED   Enterococcus Faecium NOT DETECTED NOT DETECTED   Listeria monocytogenes NOT DETECTED NOT DETECTED   Staphylococcus species DETECTED (A) NOT DETECTED   Staphylococcus aureus (BCID) NOT DETECTED NOT DETECTED   Staphylococcus epidermidis DETECTED (A) NOT DETECTED   Staphylococcus lugdunensis NOT DETECTED NOT DETECTED   Streptococcus species NOT DETECTED NOT DETECTED   Streptococcus agalactiae NOT DETECTED NOT DETECTED   Streptococcus pneumoniae NOT DETECTED NOT DETECTED   Streptococcus pyogenes NOT DETECTED NOT DETECTED   A.calcoaceticus-baumannii NOT DETECTED NOT DETECTED   Bacteroides fragilis NOT DETECTED NOT DETECTED   Enterobacterales NOT DETECTED NOT DETECTED   Enterobacter cloacae complex NOT DETECTED NOT DETECTED   Escherichia coli NOT DETECTED NOT DETECTED   Klebsiella aerogenes NOT DETECTED NOT DETECTED   Klebsiella oxytoca NOT DETECTED NOT DETECTED   Klebsiella pneumoniae NOT DETECTED NOT DETECTED   Proteus species NOT DETECTED NOT DETECTED   Salmonella species NOT DETECTED NOT DETECTED    Serratia marcescens NOT DETECTED NOT DETECTED   Haemophilus influenzae NOT DETECTED NOT DETECTED   Neisseria meningitidis NOT DETECTED NOT DETECTED   Pseudomonas aeruginosa NOT DETECTED NOT DETECTED   Stenotrophomonas maltophilia NOT DETECTED NOT DETECTED   Candida albicans NOT DETECTED NOT DETECTED   Candida auris NOT DETECTED NOT DETECTED   Candida glabrata NOT DETECTED NOT DETECTED   Candida krusei NOT DETECTED NOT DETECTED   Candida parapsilosis NOT DETECTED NOT DETECTED   Candida tropicalis NOT DETECTED NOT DETECTED   Cryptococcus neoformans/gattii NOT DETECTED NOT DETECTED   Methicillin resistance mecA/C NOT DETECTED NOT DETECTED    Arie Kurtz PharmD 03/25/2023  11:11 PM

## 2023-03-25 NOTE — Assessment & Plan Note (Addendum)
-  Resume statin at discharge 

## 2023-03-25 NOTE — Assessment & Plan Note (Signed)
-

## 2023-03-25 NOTE — Progress Notes (Signed)
 Pharmacy Antibiotic Note  [name] is a 78 y.o. male admitted on 03/24/2023 with sepsis.  Pharmacy has been consulted for Vancomycin  dosing.  Plan: Cefepime  2gm IV q12h per MD Vancomycin  1500mg  IV x1 then 1gm IV q24h to target AUC 400-550.  Estimated AUC on this regimen = 511 Monitor renal function and cx data   Height: 5\' 7"  (170.2 cm) Weight: 83.9 kg (185 lb) IBW/kg (Calculated) : 66.1  Temp (24hrs), Avg:100.1 F (37.8 C), Min:98.8 F (37.1 C), Max:102.7 F (39.3 C)  Recent Labs  Lab 03/24/23 1614 03/24/23 1623 03/24/23 1827 03/24/23 2205  WBC 3.5*  --   --   --   CREATININE 1.83*  --   --  1.69*  LATICACIDVEN  --  4.4* 3.9*  --     Estimated Creatinine Clearance: 37.3 mL/min (A) (by C-G formula based on SCr of 1.69 mg/dL (H)).    Allergies  Allergen Reactions   Codeine Nausea And Vomiting   Guaifenesin & Derivatives Nausea And Vomiting   Terbinafine And Related Other (See Comments)    Muscle pain   Iodinated Contrast Media Itching   Penicillins Rash    Tolerates Cefazolin  without problems...[employer]    Antimicrobials this admission: 1/14 Rocephin  x1 1/14 Azithromycin  x1 1/15 Cefepime  >>  1/15 Vancomycin  >>   Dose adjustments this admission:  Microbiology results: 1/14 BCx:  1/14 Resp PCR; negative  Thank you for allowing pharmacy to be a part of this patient's care.  Arie Kurtz PharmD 03/25/2023 12:37 AM

## 2023-03-25 NOTE — ED Notes (Signed)
 BP running low. Hospitalist and critical care informed. Per Critical care no need for Pressors and just give 1L LRS and maintenance fluids. Will continue to monitor.

## 2023-03-25 NOTE — Hospital Course (Addendum)
[name] is a 78 y.o. male with medical history significant for CAD s/p stent placement and adrenal insufficiency on hydrocortisone who presented with N/V/D.  There has been norovirus in the house recently with multiple sick contacts exposure.  His symptoms became worse the day prior to admission and therefore he was brought to the hospital. He was found to be volume depleted/dehydrated and hypotensive.  He was also found to be febrile.  He was started on IV fluids and admitted for further workup and monitoring. Symptomatically he improved with diarrhea.  C. difficile testing was negative and GI panel pending at discharge.  He was able to tolerate diet advancement.  Vitals were also stable at discharge.

## 2023-03-25 NOTE — ED Notes (Signed)
 Pericare performed, pt had medium episode of diarrhea, pt was provided perineal care, new brief applied, repositioned to comfort.

## 2023-03-25 NOTE — TOC Initial Note (Signed)
 Transition of Care [facility]) - Initial/Assessment Note   Patient Details  Name: [name] MRN: [id number] Date of Birth: 1945-07-29  Transition of Care [facility]) CM/SW Contact:    Zenon Hilda, LCSW Phone Number: 03/25/2023, 12:42 PM  Clinical Narrative: Patient is from home with spouse. TOC following for possible discharge needs.                 Expected Discharge Plan: Home/Self Care Barriers to Discharge: Continued Medical Work up  Expected Discharge Plan and Services In-house Referral: Clinical Social Work Living arrangements for the past 2 months: Single Family Home           DME Arranged: N/A DME Agency: NA  Prior Living Arrangements/Services Living arrangements for the past 2 months: Single Family Home Lives with:: Spouse Patient language and need for interpreter reviewed:: Yes Do you feel safe going back to the place where you live?: Yes      Need for Family Participation in Patient Care: No (Comment) Care giver support system in place?: Yes (comment) Criminal Activity/Legal Involvement Pertinent to Current Situation/Hospitalization: No - Comment as needed  Emotional Assessment Orientation: : Oriented to Self, Oriented to Place, Oriented to  Time, Oriented to Situation Alcohol  / Substance Use: Not Applicable Psych Involvement: No (comment)  Admission diagnosis:  Hypoxia [R09.02] AKI (acute kidney injury) (HCC) [N17.9] Falls [R29.6] Sepsis (HCC) [A41.9] Sepsis, due to unspecified organism, unspecified whether acute organ dysfunction present [facility]) [A41.9] Patient Active Problem List   Diagnosis Date Noted   Sepsis (HCC) 03/24/2023   Syncope 11/29/2022   Pre-syncope 11/28/2022   Elevated troponin 11/28/2022   CKD stage 3a, GFR 45-59 ml/min (HCC) 11/28/2022   CAD S/P percutaneous coronary angioplasty 11/28/2022   Coronary artery disease of bypass graft of native heart with stable angina pectoris (HCC) 11/25/2022   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   Status  post coronary artery stent placement    Post PTCA 12/24/2021   Hx of CABG    Syncope and collapse 04/09/2021   Loop Biotronik Biomonitor III for syncope and collapse 04/09/2021 04/09/2021   S/P lumbar laminectomy 07/13/2019   Chest pain 03/16/2016   Constipation 03/16/2016   HTN (hypertension)    Hyperlipidemia    Coronary artery disease of native artery of native heart with stable angina pectoris (HCC)    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 [place] [address]  [address] Phone: [phone] Fax: [phone]  [facility] - [address], [facility] - [address] [place] [address] Phone: [phone] Fax: [phone]  [facility] [place]  - [place], [place] - [address] [place] [address] Phone: [phone] Fax: [phone]  Social Drivers of Health (SDOH) Social History: SDOH Screenings   Food Insecurity: No Food Insecurity (11/28/2022)  Housing: Patient Declined (03/25/2023)  Transportation Needs: No Transportation Needs (11/28/2022)  Utilities: Not At Risk (11/28/2022)  Depression (PHQ2-9): Low Risk  (05/02/2022)  Recent Concern: Depression (PHQ2-9) - Medium Risk (03/06/2022)  Tobacco Use: Low Risk  (03/25/2023)   SDOH Interventions: Housing Interventions: Intervention Not Indicated, Inpatient TOC (Patient has a home.)  Readmission Risk Interventions    03/25/2023   12:40 PM  Readmission Risk Prevention Plan  Transportation Screening Complete  HRI or Home Care Consult Complete  Social Work Consult for Recovery Care Planning/Counseling Complete  Palliative Care Screening Not Applicable  Medication Review (RN Care Manager) Complete

## 2023-03-26 ENCOUNTER — Encounter (INDEPENDENT_AMBULATORY_CARE_PROVIDER_SITE_OTHER): Payer: PPO | Admitting: Ophthalmology

## 2023-03-26 DIAGNOSIS — R197 Diarrhea, unspecified: Secondary | ICD-10-CM | POA: Diagnosis not present

## 2023-03-26 DIAGNOSIS — A419 Sepsis, unspecified organism: Secondary | ICD-10-CM | POA: Diagnosis not present

## 2023-03-26 DIAGNOSIS — N179 Acute kidney failure, unspecified: Secondary | ICD-10-CM | POA: Diagnosis not present

## 2023-03-26 LAB — BASIC METABOLIC PANEL
Anion gap: 7 (ref 5–15)
BUN: 19 mg/dL (ref 8–23)
CO2: 20 mmol/L — ABNORMAL LOW (ref 22–32)
Calcium: 8.2 mg/dL — ABNORMAL LOW (ref 8.9–10.3)
Chloride: 106 mmol/L (ref 98–111)
Creatinine, Ser: 0.96 mg/dL (ref 0.61–1.24)
GFR, Estimated: >60 mL/min (ref >60)
Glucose, Bld: 117 mg/dL — ABNORMAL HIGH (ref 70–99)
Potassium: 3.7 mmol/L (ref 3.5–5.1)
Sodium: 133 mmol/L — ABNORMAL LOW (ref 135–145)

## 2023-03-26 LAB — CBC WITH DIFFERENTIAL/PLATELET
Abs Immature Granulocytes: 0.01 10*3/uL (ref 0.00–0.07)
Basophils Absolute: 0 10*3/uL (ref 0.0–0.1)
Basophils Relative: 0 %
Eosinophils Absolute: 0.1 10*3/uL (ref 0.0–0.5)
Eosinophils Relative: 2 %
HCT: 31 % — ABNORMAL LOW (ref 39.0–52.0)
Hemoglobin: 10.3 g/dL — ABNORMAL LOW (ref 13.0–17.0)
Immature Granulocytes: 0 %
Lymphocytes Relative: 30 %
Lymphs Abs: 1 10*3/uL (ref 0.7–4.0)
MCH: 32.2 pg (ref 26.0–34.0)
MCHC: 33.2 g/dL (ref 30.0–36.0)
MCV: 96.9 fL (ref 80.0–100.0)
Monocytes Absolute: 0.5 10*3/uL (ref 0.1–1.0)
Monocytes Relative: 14 %
Neutro Abs: 1.9 10*3/uL (ref 1.7–7.7)
Neutrophils Relative %: 54 %
Platelets: 147 10*3/uL — ABNORMAL LOW (ref 150–400)
RBC: 3.2 MIL/uL — ABNORMAL LOW (ref 4.22–5.81)
RDW: 15.2 % (ref 11.5–15.5)
WBC: 3.5 10*3/uL — ABNORMAL LOW (ref 4.0–10.5)
nRBC: 0 % (ref 0.0–0.2)

## 2023-03-26 LAB — C DIFFICILE QUICK SCREEN W PCR REFLEX
C Diff antigen: NEGATIVE
C Diff interpretation: NOT DETECTED
C Diff toxin: NEGATIVE

## 2023-03-26 LAB — MAGNESIUM: Magnesium: 2.2 mg/dL (ref 1.7–2.4)

## 2023-03-26 MED ORDER — METOPROLOL SUCCINATE ER 25 MG PO TB24: 50.0000 mg | ORAL_TABLET | Freq: Every day | ORAL | Status: DC

## 2023-03-26 MED ORDER — HYDRALAZINE HCL 20 MG/ML IJ SOLN
5.0000 mg | Freq: Four times a day (QID) | INTRAMUSCULAR | Status: DC | PRN
  Administered 2023-03-26: 5 mg via INTRAVENOUS
  Filled 2023-03-26: qty 1

## 2023-03-26 MED ORDER — ORAL CARE MOUTH RINSE: 15.0000 mL | OROMUCOSAL | Status: DC | PRN

## 2023-03-26 MED ORDER — TRIAMTERENE-HCTZ 37.5-25 MG PO CAPS
1.0000 | ORAL_CAPSULE | ORAL | Status: DC
  Administered 2023-03-26: 1 via ORAL
  Filled 2023-03-26: qty 1

## 2023-03-26 MED ORDER — HYDRALAZINE HCL 20 MG/ML IJ SOLN
10.0000 mg | INTRAMUSCULAR | Status: DC | PRN
  Administered 2023-03-26: 10 mg via INTRAVENOUS
  Filled 2023-03-26 (×2): qty 1

## 2023-03-26 NOTE — Progress Notes (Signed)
Progress Note    [name]   AYT:[id number]  DOB: 30-Nov-1945  DOA: 03/24/2023     2 PCP: Gaspar Garbe, MD  Initial CC: N/V/D  Hospital Course: [name] is a 78 y.o. male with medical history significant for CAD s/p stent placement and adrenal insufficiency on hydrocortisone who presented with N/V/D.  There has been norovirus in the house recently with multiple sick contacts exposure.  His symptoms became worse the day prior to admission and therefore he was brought to the hospital. He was found to be volume depleted/dehydrated and hypotensive.  He was also found to be febrile.  He was started on IV fluids and admitted for further workup and monitoring.  Interval History:  Tolerated liquids well yesterday.  Thinks the diarrhea has mildly improved as well.  No fevers or chills.  Does have some abdominal tenderness.  States his wife is also now in the ER with similar symptoms.  Assessment and Plan: * Sepsis (HCC)-resolved as of 03/26/2023 - Febrile, tachycardia, tachypnea; source suspected viral GI illness -Continue fluids and supportive care - Discontinue antibiotics  Diarrhea - Patient and wife report norovirus outbreak at home -Slightly more abdominal tenderness today -Go ahead and check stool studies at this time  AKI (acute kidney injury) (HCC)-resolved as of 03/26/2023 - baseline creatinine ~ 1.1 - patient presents with increase in creat >0.3 mg/dL above baseline or creat increase >1.5x baseline presumed to have occurred within past 7 days PTA - creat 1.83 on admission, presumed volume depletion from GI losses - Responded well to fluids  Adrenal insufficiency (HCC) Hypotensive on admission and started on stress dose steroids - Pressure now improved.  Transition back to home steroid dosing  Hypomagnesemia - Replete as needed  CAD S/P percutaneous coronary angioplasty - Continue aspirin - Beta-blocker and statin on hold  Hyperlipidemia - Statin on  hold for now   Old records reviewed in assessment of this patient  Antimicrobials:   DVT prophylaxis:  enoxaparin (LOVENOX) injection 40 mg Start: 03/25/23 1000   Code Status:   Code Status: Full Code  Mobility Assessment (Last 72 Hours)     Mobility Assessment   No documentation.           Barriers to discharge: none Disposition Plan:  Home HH orders placed:  Status is: Inpt  Objective: Blood pressure (!) 179/60, pulse 62, temperature 98.6 F (37 C), temperature source Axillary, resp. rate 20, height 5\' 7"  (1.702 m), weight 83.9 kg, SpO2 99%.  Examination:  Physical Exam Constitutional:      General: He is not in acute distress.    Appearance: Normal appearance.  HENT:     Head: Normocephalic and atraumatic.     Mouth/Throat:     Mouth: Mucous membranes are moist.  Eyes:     Extraocular Movements: Extraocular movements intact.  Cardiovascular:     Rate and Rhythm: Normal rate and regular rhythm.  Pulmonary:     Effort: Pulmonary effort is normal. No respiratory distress.     Breath sounds: Normal breath sounds. No wheezing.  Abdominal:     General: Bowel sounds are normal. There is no distension.     Palpations: Abdomen is soft.     Tenderness: There is no abdominal tenderness.  Musculoskeletal:        General: Normal range of motion.     Cervical back: Normal range of motion and neck supple.  Skin:    General: Skin is warm and dry.  Neurological:     General: No focal deficit present.     Mental Status: He is alert.  Psychiatric:        Mood and Affect: Mood normal.        Behavior: Behavior normal.      Consultants:    Procedures:    Data Reviewed: Results for orders placed or performed during the hospital encounter of 03/24/23 (from the past 24 hours)  Basic metabolic panel     Status: Abnormal   Collection Time: 03/25/23 12:31 PM  Result Value Ref Range   Sodium 132 (L) 135 - 145 mmol/L   Potassium 3.8 3.5 - 5.1 mmol/L   Chloride  104 98 - 111 mmol/L   CO2 20 (L) 22 - 32 mmol/L   Glucose, Bld 127 (H) 70 - 99 mg/dL   BUN 28 (H) 8 - 23 mg/dL   Creatinine, Ser 1.61 (H) 0.61 - 1.24 mg/dL   Calcium 8.1 (L) 8.9 - 10.3 mg/dL   GFR, Estimated 58 (L) >60 mL/min   Anion gap 8 5 - 15  CBC with Differential/Platelet     Status: Abnormal   Collection Time: 03/25/23 12:31 PM  Result Value Ref Range   WBC 3.8 (L) 4.0 - 10.5 K/uL   RBC 3.64 (L) 4.22 - 5.81 MIL/uL   Hemoglobin 11.8 (L) 13.0 - 17.0 g/dL   HCT 09.6 (L) 04.5 - 40.9 %   MCV 98.1 80.0 - 100.0 fL   MCH 32.4 26.0 - 34.0 pg   MCHC 33.1 30.0 - 36.0 g/dL   RDW 81.1 (H) 91.4 - 78.2 %   Platelets 166 150 - 400 K/uL   nRBC 0.0 0.0 - 0.2 %   Neutrophils Relative % 81 %   Neutro Abs 3.1 1.7 - 7.7 K/uL   Lymphocytes Relative 12 %   Lymphs Abs 0.5 (L) 0.7 - 4.0 K/uL   Monocytes Relative 6 %   Monocytes Absolute 0.2 0.1 - 1.0 K/uL   Eosinophils Relative 1 %   Eosinophils Absolute 0.0 0.0 - 0.5 K/uL   Basophils Relative 0 %   Basophils Absolute 0.0 0.0 - 0.1 K/uL   Immature Granulocytes 0 %   Abs Immature Granulocytes 0.01 0.00 - 0.07 K/uL  Magnesium     Status: None   Collection Time: 03/25/23 12:31 PM  Result Value Ref Range   Magnesium 2.1 1.7 - 2.4 mg/dL  Basic metabolic panel     Status: Abnormal   Collection Time: 03/26/23  3:12 AM  Result Value Ref Range   Sodium 133 (L) 135 - 145 mmol/L   Potassium 3.7 3.5 - 5.1 mmol/L   Chloride 106 98 - 111 mmol/L   CO2 20 (L) 22 - 32 mmol/L   Glucose, Bld 117 (H) 70 - 99 mg/dL   BUN 19 8 - 23 mg/dL   Creatinine, Ser 9.56 0.61 - 1.24 mg/dL   Calcium 8.2 (L) 8.9 - 10.3 mg/dL   GFR, Estimated >21 >30 mL/min   Anion gap 7 5 - 15  CBC with Differential/Platelet     Status: Abnormal   Collection Time: 03/26/23  3:12 AM  Result Value Ref Range   WBC 3.5 (L) 4.0 - 10.5 K/uL   RBC 3.20 (L) 4.22 - 5.81 MIL/uL   Hemoglobin 10.3 (L) 13.0 - 17.0 g/dL   HCT 86.5 (L) 78.4 - 69.6 %   MCV 96.9 80.0 - 100.0 fL   MCH 32.2 26.0 -  34.0 pg  MCHC 33.2 30.0 - 36.0 g/dL   RDW 40.9 81.1 - 91.4 %   Platelets 147 (L) 150 - 400 K/uL   nRBC 0.0 0.0 - 0.2 %   Neutrophils Relative % 54 %   Neutro Abs 1.9 1.7 - 7.7 K/uL   Lymphocytes Relative 30 %   Lymphs Abs 1.0 0.7 - 4.0 K/uL   Monocytes Relative 14 %   Monocytes Absolute 0.5 0.1 - 1.0 K/uL   Eosinophils Relative 2 %   Eosinophils Absolute 0.1 0.0 - 0.5 K/uL   Basophils Relative 0 %   Basophils Absolute 0.0 0.0 - 0.1 K/uL   Immature Granulocytes 0 %   Abs Immature Granulocytes 0.01 0.00 - 0.07 K/uL  Magnesium     Status: None   Collection Time: 03/26/23  3:12 AM  Result Value Ref Range   Magnesium 2.2 1.7 - 2.4 mg/dL    I have reviewed pertinent nursing notes, vitals, labs, and images as necessary. I have ordered labwork to follow up on as indicated.  I have reviewed the last notes from staff over past 24 hours. I have discussed patient's care plan and test results with nursing staff, CM/SW, and other staff as appropriate.  Time spent: Greater than 50% of the 55 minute visit was spent in counseling/coordination of care for the patient as laid out in the A&P.   LOS: 2 days   Lewie Chamber, MD [facility] 03/26/2023, 11:36 AM

## 2023-03-26 NOTE — Assessment & Plan Note (Addendum)
-   Patient and wife report norovirus outbreak at home -- Abdominal tenderness improved prior to discharge - C. difficile negative - GI panel pending at discharge - Diarrhea slowly resolving at discharge as well

## 2023-03-26 NOTE — Plan of Care (Signed)
  Problem: Respiratory: Goal: Ability to maintain adequate ventilation will improve Outcome: Progressing   Problem: Clinical Measurements: Goal: Respiratory complications will improve Outcome: Progressing   Problem: Nutrition: Goal: Adequate nutrition will be maintained Outcome: Progressing   Problem: Elimination: Goal: Will not experience complications related to urinary retention Outcome: Progressing   Problem: Pain Management: Goal: General experience of comfort will improve Outcome: Progressing   Problem: Safety: Goal: Ability to remain free from injury will improve Outcome: Progressing   Problem: Coping: Goal: Level of anxiety will decrease Outcome: Not Progressing

## 2023-03-27 DIAGNOSIS — R197 Diarrhea, unspecified: Secondary | ICD-10-CM | POA: Diagnosis not present

## 2023-03-27 DIAGNOSIS — N179 Acute kidney failure, unspecified: Secondary | ICD-10-CM | POA: Diagnosis not present

## 2023-03-27 DIAGNOSIS — R799 Abnormal finding of blood chemistry, unspecified: Secondary | ICD-10-CM

## 2023-03-27 DIAGNOSIS — A419 Sepsis, unspecified organism: Secondary | ICD-10-CM | POA: Diagnosis not present

## 2023-03-27 LAB — GASTROINTESTINAL PANEL BY PCR, STOOL (REPLACES STOOL CULTURE)

## 2023-03-27 LAB — MAGNESIUM: Magnesium: 2 mg/dL (ref 1.7–2.4)

## 2023-03-27 LAB — CBC WITH DIFFERENTIAL/PLATELET
Abs Immature Granulocytes: 0 10*3/uL (ref 0.00–0.07)
Basophils Absolute: 0 10*3/uL (ref 0.0–0.1)
Basophils Relative: 0 %
Eosinophils Absolute: 0.1 10*3/uL (ref 0.0–0.5)
Eosinophils Relative: 2 %
HCT: 34.3 % — ABNORMAL LOW (ref 39.0–52.0)
Hemoglobin: 11.6 g/dL — ABNORMAL LOW (ref 13.0–17.0)
Immature Granulocytes: 0 %
Lymphocytes Relative: 33 %
Lymphs Abs: 1.4 10*3/uL (ref 0.7–4.0)
MCH: 32.5 pg (ref 26.0–34.0)
MCHC: 33.8 g/dL (ref 30.0–36.0)
MCV: 96.1 fL (ref 80.0–100.0)
Monocytes Absolute: 0.4 10*3/uL (ref 0.1–1.0)
Monocytes Relative: 8 %
Neutro Abs: 2.5 10*3/uL (ref 1.7–7.7)
Neutrophils Relative %: 57 %
Platelets: 140 10*3/uL — ABNORMAL LOW (ref 150–400)
RBC: 3.57 MIL/uL — ABNORMAL LOW (ref 4.22–5.81)
RDW: 14.9 % (ref 11.5–15.5)
WBC: 4.4 10*3/uL (ref 4.0–10.5)
nRBC: 0 % (ref 0.0–0.2)

## 2023-03-27 LAB — BASIC METABOLIC PANEL
Anion gap: 9 (ref 5–15)
BUN: 16 mg/dL (ref 8–23)
CO2: 22 mmol/L (ref 22–32)
Calcium: 8.7 mg/dL — ABNORMAL LOW (ref 8.9–10.3)
Chloride: 106 mmol/L (ref 98–111)
Creatinine, Ser: 1.15 mg/dL (ref 0.61–1.24)
GFR, Estimated: >60 mL/min (ref >60)
Glucose, Bld: 153 mg/dL — ABNORMAL HIGH (ref 70–99)
Potassium: 3.7 mmol/L (ref 3.5–5.1)
Sodium: 137 mmol/L (ref 135–145)

## 2023-03-27 LAB — CULTURE, BLOOD (ROUTINE X 2)

## 2023-03-27 NOTE — Discharge Summary (Signed)
Physician Discharge Summary   [name] QIO:[id number] DOB: 1945/06/27 DOA: 03/24/2023  PCP: Gaspar Garbe, MD  Admit date: 03/24/2023 Discharge date: 03/27/2023   Admitted From: Home Disposition:  Home Discharging physician: Lewie Chamber, MD Barriers to discharge: none  Recommendations at discharge: Continue chronic medical management    Discharge Condition: stable CODE STATUS: Full Diet recommendation:  Diet Orders (From admission, onward)     Start     Ordered   03/27/23 0000  Diet general        03/27/23 0910   03/26/23 0835  Diet regular Fluid consistency: Thin  Diet effective now       Question:  Fluid consistency:  Answer:  Thin   03/26/23 0834            Hospital Course: [name] is a 78 y.o. male with medical history significant for CAD s/p stent placement and adrenal insufficiency on hydrocortisone who presented with N/V/D.  There has been norovirus in the house recently with multiple sick contacts exposure.  His symptoms became worse the day prior to admission and therefore he was brought to the hospital. He was found to be volume depleted/dehydrated and hypotensive.  He was also found to be febrile.  He was started on IV fluids and admitted for further workup and monitoring. Symptomatically he improved with diarrhea.  C. difficile testing was negative and GI panel pending at discharge.  He was able to tolerate diet advancement.  Vitals were also stable at discharge.  Assessment and Plan: * Sepsis (HCC)-resolved as of 03/26/2023 - Febrile, tachycardia, tachypnea; source suspected viral GI illness -Continue fluids and supportive care - Discontinue antibiotics  Diarrhea - Patient and wife report norovirus outbreak at home -- Abdominal tenderness improved prior to discharge - C. difficile negative - GI panel pending at discharge - Diarrhea slowly resolving at discharge as well  AKI (acute kidney injury) (HCC)-resolved as of 03/26/2023 -  baseline creatinine ~ 1.1 - patient presents with increase in creat >0.3 mg/dL above baseline or creat increase >1.5x baseline presumed to have occurred within past 7 days PTA - creat 1.83 on admission, presumed volume depletion from GI losses - Responded well to fluids  Adrenal insufficiency (HCC) Hypotensive on admission and started on stress dose steroids - Pressure now improved.  Transition back to home steroid dosing  Hypomagnesemia - Replete as needed  CAD S/P percutaneous coronary angioplasty - Continue aspirin -Remainder home meds resumed at discharge  Hyperlipidemia - Resume statin at discharge  Contamination of blood culture-resolved as of 03/27/2023 - 1/4 bottles growing staph epi from 1/14; considered contaminate - no further workup    The patient's acute and chronic medical conditions were treated accordingly. On day of discharge, patient was felt deemed stable for discharge. Patient/family member advised to call PCP or come back to ER if needed.   Principal Diagnosis: Sepsis [facility])  Discharge Diagnoses: Active Hospital Problems   Diagnosis Date Noted   Diarrhea 03/26/2023    Priority: 2.   Adrenal insufficiency (HCC) 03/25/2023    Priority: 3.   Hypomagnesemia 03/25/2023   CAD S/P percutaneous coronary angioplasty 11/28/2022   Hx of CABG    HTN (hypertension)    Hyperlipidemia     Resolved Hospital Problems   Diagnosis Date Noted Date Resolved   Sepsis [facility] 03/24/2023 03/26/2023    Priority: 1.   AKI (acute kidney injury) (HCC) 11/28/2022 03/26/2023    Priority: 2.   Contamination of blood culture 03/27/2023 03/27/2023  Discharge Instructions     Diet general   Complete by: As directed    Increase activity slowly   Complete by: As directed       Allergies as of 03/27/2023       Reactions   Codeine Nausea And Vomiting   Guaifenesin & Derivatives Nausea And Vomiting   Terbinafine And Related Other (See Comments)   Muscle pain   Iodinated  Contrast Media Itching   Penicillins Rash   Tolerates Cefazolin without problems...[employer]        Medication List     STOP taking these medications    traMADol 50 MG tablet Commonly known as: ULTRAM       TAKE these medications    acetaminophen 500 MG tablet Commonly known as: TYLENOL Take 1,000 mg by mouth every 6 (six) hours as needed for moderate pain.   ascorbic acid 500 MG tablet Commonly known as: VITAMIN C Take 500 mg by mouth daily.   aspirin 81 MG chewable tablet Chew 81 mg by mouth every evening.   benzonatate 100 MG capsule Commonly known as: TESSALON Take 1 capsule (100 mg total) by mouth 3 (three) times daily as needed for cough.   COQ10 PO Take 300 mg by mouth 2 (two) times daily.   ezetimibe 10 MG tablet Commonly known as: ZETIA TAKE 1/2 TABLET BY MOUTH EVERY EVENING   ferrous sulfate 325 (65 FE) MG tablet Take 1 tablet (325 mg total) by mouth daily with breakfast.   fluticasone 50 MCG/ACT nasal spray Commonly known as: FLONASE Place 1 spray into both nostrils daily.   hydrALAZINE 25 MG tablet Commonly known as: APRESOLINE Take 25 mg by mouth 3 (three) times daily as needed (hypertension).   hydrocortisone 10 MG tablet Commonly known as: CORTEF Take 1 tablet (10 mg total) by mouth in the morning. What changed: additional instructions   hydrocortisone 5 MG tablet Commonly known as: CORTEF Take 1 tablet (5 mg total) by mouth daily in the afternoon. What changed: additional instructions   ICAPS AREDS 2 PO Take 1 capsule by mouth daily.   ipratropium-albuterol 0.5-2.5 (3) MG/3ML Soln Commonly known as: DUONEB Take 3 mLs by nebulization every 6 (six) hours as needed (wheezing or shortness of breath).   loperamide 2 MG capsule Commonly known as: IMODIUM Take 2 mg by mouth as needed for diarrhea or loose stools.   metoprolol succinate 50 MG 24 hr tablet Commonly known as: TOPROL-XL Take 1 tablet (50 mg total) by mouth daily. Take with or  immediately following a meal.   midodrine 2.5 MG tablet Commonly known as: PROAMATINE Take 1 tablet (2.5 mg total) by mouth 3 (three) times daily with meals as needed (SBP less than 100).   multivitamin tablet Take 1 tablet by mouth in the morning. Seniors   nitroGLYCERIN 0.4 MG SL tablet Commonly known as: NITROSTAT DISSOLVE 1 TABLET UNDER THE TONGUE AS NEEDED FOR CHEST PAIN EVERY 5 MINUTES UP TO 3 TIMES. IF NO RELIEF CALL 911.   OMEGA-3 FATTY ACIDS PO Take 1,200 mg by mouth in the morning and at bedtime.   pantoprazole 40 MG tablet Commonly known as: Protonix Take 1 tablet (40 mg total) by mouth daily.   Polyethyl Glycol-Propyl Glycol 0.4-0.3 % Gel ophthalmic gel Commonly known as: SYSTANE Place 1 application into both eyes at bedtime.   PRESCRIPTION MEDICATION Place 1 Dose into the left eye every 14 (fourteen) days. Injection for diabetic retinopathy. Unknown drug/strength.   pyridoxine 100 MG tablet  Commonly known as: B-6 Take 100 mg by mouth every evening.   ranolazine 1000 MG SR tablet Commonly known as: RANEXA Take 1 tablet (1,000 mg total) by mouth daily.   rosuvastatin 20 MG tablet Commonly known as: CRESTOR TAKE 1 TABLET BY MOUTH EVERY DAY AT BEDTIME   sennosides-docusate sodium 8.6-50 MG tablet Commonly known as: SENOKOT-S Take 1 tablet by mouth daily as needed for constipation.   Systane Balance 0.6 % Soln Generic drug: Propylene Glycol Place 1 drop into both eyes 3 (three) times daily as needed (dry/irritated eyes.).   triamterene-hydrochlorothiazide 37.5-25 MG capsule Commonly known as: DYAZIDE Take 1 capsule by mouth every other day.   trimethoprim-polymyxin b ophthalmic solution Commonly known as: POLYTRIM Place 1 drop into the left eye See admin instructions. Instill 1 drop in left eye four times daily for 2 days after monthly eye injection   TUMS ULTRA PO Take 2 tablets by mouth daily as needed (acid reflux/indigestion.).   Vitamin B-12 5000  MCG Tbdp Take 5,000 mcg by mouth every evening.        Allergies  Allergen Reactions   Codeine Nausea And Vomiting   Guaifenesin & Derivatives Nausea And Vomiting   Terbinafine And Related Other (See Comments)    Muscle pain   Iodinated Contrast Media Itching   Penicillins Rash    Tolerates Cefazolin without problems...[employer]    Consultations:   Procedures:   Discharge Exam: BP (!) 121/90   Pulse 82   Temp 98.4 F (36.9 C) (Oral)   Resp 19   Ht 5\' 7"  (1.702 m)   Wt 83.9 kg   SpO2 100%   BMI 28.98 kg/m  Physical Exam Constitutional:      General: He is not in acute distress.    Appearance: Normal appearance.  HENT:     Head: Normocephalic and atraumatic.     Mouth/Throat:     Mouth: Mucous membranes are moist.  Eyes:     Extraocular Movements: Extraocular movements intact.  Cardiovascular:     Rate and Rhythm: Normal rate and regular rhythm.  Pulmonary:     Effort: Pulmonary effort is normal. No respiratory distress.     Breath sounds: Normal breath sounds. No wheezing.  Abdominal:     General: Bowel sounds are normal. There is no distension.     Palpations: Abdomen is soft.     Tenderness: There is no abdominal tenderness.  Musculoskeletal:        General: Normal range of motion.     Cervical back: Normal range of motion and neck supple.  Skin:    General: Skin is warm and dry.  Neurological:     General: No focal deficit present.     Mental Status: He is alert.  Psychiatric:        Mood and Affect: Mood normal.        Behavior: Behavior normal.      The results of significant diagnostics from this hospitalization (including imaging, microbiology, ancillary and laboratory) are listed below for reference.   Microbiology: Recent Results (from the past 240 hours)  Resp panel by RT-PCR (RSV, Flu A&B, Covid) Anterior Nasal Swab     Status: None   Collection Time: 03/24/23  4:08 PM   Specimen: Anterior Nasal Swab  Result Value Ref Range Status   SARS  Coronavirus 2 by RT PCR NEGATIVE NEGATIVE Final    Comment: (NOTE) SARS-CoV-2 target nucleic acids are NOT DETECTED.  The SARS-CoV-2 RNA is generally detectable  in upper respiratory specimens during the acute phase of infection. The lowest concentration of SARS-CoV-2 viral copies this assay can detect is 138 copies/mL. A negative result does not preclude SARS-Cov-2 infection and should not be used as the sole basis for treatment or other patient management decisions. A negative result may occur with  improper specimen collection/handling, submission of specimen other than nasopharyngeal swab, presence of viral mutation(s) within the areas targeted by this assay, and inadequate number of viral copies(<138 copies/mL). A negative result must be combined with clinical observations, patient history, and epidemiological information. The expected result is Negative.  Fact Sheet for Patients:  BloggerCourse.com  Fact Sheet for Healthcare Providers:  SeriousBroker.it  This test is no t yet approved or cleared by the [place] FDA and  has been authorized for detection and/or diagnosis of SARS-CoV-2 by FDA under an Emergency Use Authorization (EUA). This EUA will remain  in effect (meaning this test can be used) for the duration of the COVID-19 declaration under Section 564(b)(1) of the Act, 21 U.S.C.section 360bbb-3(b)(1), unless the authorization is terminated  or revoked sooner.       Influenza A by PCR NEGATIVE NEGATIVE Final   Influenza B by PCR NEGATIVE NEGATIVE Final    Comment: (NOTE) The Xpert Xpress SARS-CoV-2/FLU/RSV plus assay is intended as an aid in the diagnosis of influenza from Nasopharyngeal swab specimens and should not be used as a sole basis for treatment. Nasal washings and aspirates are unacceptable for Xpert Xpress SARS-CoV-2/FLU/RSV testing.  Fact Sheet for  Patients: BloggerCourse.com  Fact Sheet for Healthcare Providers: SeriousBroker.it  This test is not yet approved or cleared by the [place] FDA and has been authorized for detection and/or diagnosis of SARS-CoV-2 by FDA under an Emergency Use Authorization (EUA). This EUA will remain in effect (meaning this test can be used) for the duration of the COVID-19 declaration under Section 564(b)(1) of the Act, 21 U.S.C. section 360bbb-3(b)(1), unless the authorization is terminated or revoked.     Resp Syncytial Virus by PCR NEGATIVE NEGATIVE Final    Comment: (NOTE) Fact Sheet for Patients: BloggerCourse.com  Fact Sheet for Healthcare Providers: SeriousBroker.it  This test is not yet approved or cleared by the [place] FDA and has been authorized for detection and/or diagnosis of SARS-CoV-2 by FDA under an Emergency Use Authorization (EUA). This EUA will remain in effect (meaning this test can be used) for the duration of the COVID-19 declaration under Section 564(b)(1) of the Act, 21 U.S.C. section 360bbb-3(b)(1), unless the authorization is terminated or revoked.  Performed at [facility], [address] [place], [place] [address]   Blood Culture (routine x 2)     Status: Abnormal   Collection Time: 03/24/23  4:13 PM   Specimen: BLOOD  Result Value Ref Range Status   Specimen Description   Final    BLOOD BLOOD RIGHT ARM Performed at [facility], [address], [place], [place] [address]    Special Requests   Final    BOTTLES DRAWN AEROBIC AND ANAEROBIC Blood Culture results may not be optimal due to an inadequate volume of blood received in culture bottles Performed at [facility], [address], [place], [place] [address]    Culture  Setup Time   Final    GRAM POSITIVE COCCI IN CLUSTERS ANAEROBIC  BOTTLE ONLY CRITICAL RESULT CALLED TO, READ BACK BY AND VERIFIED WITH: PHARMD Damaris Hippo [id number] @2255  FH    Culture (A)  Final    STAPHYLOCOCCUS EPIDERMIDIS THE SIGNIFICANCE OF ISOLATING THIS ORGANISM FROM A SINGLE SET OF BLOOD CULTURES WHEN MULTIPLE SETS ARE DRAWN IS UNCERTAIN. PLEASE NOTIFY THE MICROBIOLOGY DEPARTMENT WITHIN ONE WEEK IF SPECIATION AND SENSITIVITIES ARE REQUIRED. Performed at [facility], [address] [place], [place] [address]    Report Status 03/27/2023 FINAL  Final  Blood Culture (routine x 2)     Status: None (Preliminary result)   Collection Time: 03/24/23  4:13 PM   Specimen: BLOOD  Result Value Ref Range Status   Specimen Description   Final    BLOOD LEFT ANTECUBITAL Performed at [facility], [address] [place], [place] [address]    Special Requests   Final    BOTTLES DRAWN AEROBIC AND ANAEROBIC Blood Culture results may not be optimal due to an inadequate volume of blood received in culture bottles Performed at [facility], [address], [place], [place] [address]    Culture   Final    NO GROWTH 3 DAYS Performed at [facility], [address] [place], [place] [address]    Report Status PENDING  Incomplete  Blood Culture ID Panel (Reflexed)     Status: Abnormal   Collection Time: 03/24/23  4:13 PM  Result Value Ref Range Status   Enterococcus faecalis NOT DETECTED NOT DETECTED Final   Enterococcus Faecium NOT DETECTED NOT DETECTED Final   Listeria monocytogenes NOT DETECTED NOT DETECTED Final   Staphylococcus species DETECTED (A) NOT DETECTED Final    Comment: CRITICAL RESULT CALLED TO, READ BACK BY AND VERIFIED WITH: PHARMD Damaris Hippo [id number] @2255  FH    Staphylococcus aureus (BCID) NOT DETECTED NOT DETECTED Final   Staphylococcus epidermidis DETECTED (A) NOT DETECTED Final    Comment: CRITICAL RESULT CALLED TO, READ BACK BY AND VERIFIED WITH: PHARMD Damaris Hippo [id number] @2255  FH     Staphylococcus lugdunensis NOT DETECTED NOT DETECTED Final   Streptococcus species NOT DETECTED NOT DETECTED Final   Streptococcus agalactiae NOT DETECTED NOT DETECTED Final   Streptococcus pneumoniae NOT DETECTED NOT DETECTED Final   Streptococcus pyogenes NOT DETECTED NOT DETECTED Final   A.calcoaceticus-baumannii NOT DETECTED NOT DETECTED Final   Bacteroides fragilis NOT DETECTED NOT DETECTED Final   Enterobacterales NOT DETECTED NOT DETECTED Final   Enterobacter cloacae complex NOT DETECTED NOT DETECTED Final   Escherichia coli NOT DETECTED NOT DETECTED Final   Klebsiella aerogenes NOT DETECTED NOT DETECTED Final   Klebsiella oxytoca NOT DETECTED NOT DETECTED Final   Klebsiella pneumoniae NOT DETECTED NOT DETECTED Final   Proteus species NOT DETECTED NOT DETECTED Final   Salmonella species NOT DETECTED NOT DETECTED Final   Serratia marcescens NOT DETECTED NOT DETECTED Final   Haemophilus influenzae NOT DETECTED NOT DETECTED Final   Neisseria meningitidis NOT DETECTED NOT DETECTED Final   Pseudomonas aeruginosa NOT DETECTED NOT DETECTED Final   Stenotrophomonas maltophilia NOT DETECTED NOT DETECTED Final   Candida albicans NOT DETECTED NOT DETECTED Final   Candida auris NOT DETECTED NOT DETECTED Final   Candida glabrata NOT DETECTED NOT DETECTED Final   Candida krusei NOT DETECTED NOT DETECTED Final   Candida parapsilosis NOT DETECTED NOT DETECTED Final   Candida tropicalis NOT DETECTED NOT DETECTED Final   Cryptococcus neoformans/gattii NOT DETECTED NOT DETECTED Final   Methicillin resistance mecA/C NOT DETECTED NOT DETECTED Final    Comment: Performed at [facility], [address], [place], [place] [address]  MRSA Next Gen by  PCR, Nasal     Status: None   Collection Time: 03/25/23  6:57 AM   Specimen: Nasal Mucosa; Nasal Swab  Result Value Ref Range Status   MRSA by PCR Next Gen NOT DETECTED NOT DETECTED Final    Comment: (NOTE) The GeneXpert MRSA Assay (FDA approved  for NASAL specimens only), is one component of a comprehensive MRSA colonization surveillance program. It is not intended to diagnose MRSA infection nor to guide or monitor treatment for MRSA infections. Test performance is not FDA approved in patients less than 80 years old. Performed at [facility], [address], [place], [place] [address]   C Difficile Quick Screen w PCR reflex     Status: None   Collection Time: 03/26/23 11:07 AM   Specimen: STOOL  Result Value Ref Range Status   C Diff antigen NEGATIVE NEGATIVE Final   C Diff toxin NEGATIVE NEGATIVE Final   C Diff interpretation No C. difficile detected.  Final    Comment: Performed at [facility], [address], [place], [place] [address]     Labs: BNP (last 3 results) Recent Labs    11/30/22 1333 12/01/22 0603  BNP 75.2 101.7*   Basic Metabolic Panel: Recent Labs  Lab 03/24/23 1614 03/24/23 2205 03/25/23 1231 03/26/23 0312 03/27/23 0327  NA 136 132* 132* 133* 137  K 4.1 4.4 3.8 3.7 3.7  CL 108 106 104 106 106  CO2 19* 18* 20* 20* 22  GLUCOSE 172* 112* 127* 117* 153*  BUN 49* 45* 28* 19 16  CREATININE 1.83* 1.69* 1.27* 0.96 1.15  CALCIUM 8.7* 7.3* 8.1* 8.2* 8.7*  MG  --  1.5* 2.1 2.2 2.0   Liver Function Tests: Recent Labs  Lab 03/24/23 1614 03/24/23 2205  AST 31 30  ALT 21 18  ALKPHOS 43 30*  BILITOT 1.8* 0.9  PROT 6.6 4.7*  ALBUMIN 3.7 2.5*   Recent Labs  Lab 03/24/23 1614  LIPASE 25   No results for input(s): "AMMONIA" in the last 168 hours. CBC: Recent Labs  Lab 03/24/23 1614 03/25/23 1231 03/26/23 0312 03/27/23 0327  WBC 3.5* 3.8* 3.5* 4.4  NEUTROABS 3.2 3.1 1.9 2.5  HGB 14.2 11.8* 10.3* 11.6*  HCT 43.4 35.7* 31.0* 34.3*  MCV 99.5 98.1 96.9 96.1  PLT 216 166 147* 140*   Cardiac Enzymes: No results for input(s): "CKTOTAL", "CKMB", "CKMBINDEX", "TROPONINI" in the last 168 hours. BNP: Invalid input(s): "POCBNP" CBG: No results for  input(s): "GLUCAP" in the last 168 hours. D-Dimer No results for input(s): "DDIMER" in the last 72 hours. Hgb A1c No results for input(s): "HGBA1C" in the last 72 hours. Lipid Profile No results for input(s): "CHOL", "HDL", "LDLCALC", "TRIG", "CHOLHDL", "LDLDIRECT" in the last 72 hours. Thyroid function studies No results for input(s): "TSH", "T4TOTAL", "T3FREE", "THYROIDAB" in the last 72 hours.  Invalid input(s): "FREET3" Anemia work up No results for input(s): "VITAMINB12", "FOLATE", "FERRITIN", "TIBC", "IRON", "RETICCTPCT" in the last 72 hours. Urinalysis    Component Value Date/Time   COLORURINE YELLOW 03/24/2023 0456   APPEARANCEUR HAZY (A) 03/24/2023 0456   LABSPEC 1.018 03/24/2023 0456   PHURINE 5.0 03/24/2023 0456   GLUCOSEU NEGATIVE 03/24/2023 0456   HGBUR NEGATIVE 03/24/2023 0456   BILIRUBINUR NEGATIVE 03/24/2023 0456   KETONESUR NEGATIVE 03/24/2023 0456   PROTEINUR NEGATIVE 03/24/2023 0456   NITRITE NEGATIVE 03/24/2023 0456   LEUKOCYTESUR NEGATIVE 03/24/2023 0456   Sepsis Labs Recent Labs  Lab 03/24/23 1614 03/25/23 1231 03/26/23 0981  03/27/23 0327  WBC 3.5* 3.8* 3.5* 4.4   Microbiology Recent Results (from the past 240 hours)  Resp panel by RT-PCR (RSV, Flu A&B, Covid) Anterior Nasal Swab     Status: None   Collection Time: 03/24/23  4:08 PM   Specimen: Anterior Nasal Swab  Result Value Ref Range Status   SARS Coronavirus 2 by RT PCR NEGATIVE NEGATIVE Final    Comment: (NOTE) SARS-CoV-2 target nucleic acids are NOT DETECTED.  The SARS-CoV-2 RNA is generally detectable in upper respiratory specimens during the acute phase of infection. The lowest concentration of SARS-CoV-2 viral copies this assay can detect is 138 copies/mL. A negative result does not preclude SARS-Cov-2 infection and should not be used as the sole basis for treatment or other patient management decisions. A negative result may occur with  improper specimen collection/handling,  submission of specimen other than nasopharyngeal swab, presence of viral mutation(s) within the areas targeted by this assay, and inadequate number of viral copies(<138 copies/mL). A negative result must be combined with clinical observations, patient history, and epidemiological information. The expected result is Negative.  Fact Sheet for Patients:  BloggerCourse.com  Fact Sheet for Healthcare Providers:  SeriousBroker.it  This test is no t yet approved or cleared by the [place] FDA and  has been authorized for detection and/or diagnosis of SARS-CoV-2 by FDA under an Emergency Use Authorization (EUA). This EUA will remain  in effect (meaning this test can be used) for the duration of the COVID-19 declaration under Section 564(b)(1) of the Act, 21 U.S.C.section 360bbb-3(b)(1), unless the authorization is terminated  or revoked sooner.       Influenza A by PCR NEGATIVE NEGATIVE Final   Influenza B by PCR NEGATIVE NEGATIVE Final    Comment: (NOTE) The Xpert Xpress SARS-CoV-2/FLU/RSV plus assay is intended as an aid in the diagnosis of influenza from Nasopharyngeal swab specimens and should not be used as a sole basis for treatment. Nasal washings and aspirates are unacceptable for Xpert Xpress SARS-CoV-2/FLU/RSV testing.  Fact Sheet for Patients: BloggerCourse.com  Fact Sheet for Healthcare Providers: SeriousBroker.it  This test is not yet approved or cleared by the [place] FDA and has been authorized for detection and/or diagnosis of SARS-CoV-2 by FDA under an Emergency Use Authorization (EUA). This EUA will remain in effect (meaning this test can be used) for the duration of the COVID-19 declaration under Section 564(b)(1) of the Act, 21 U.S.C. section 360bbb-3(b)(1), unless the authorization is terminated or revoked.     Resp Syncytial Virus by PCR NEGATIVE  NEGATIVE Final    Comment: (NOTE) Fact Sheet for Patients: BloggerCourse.com  Fact Sheet for Healthcare Providers: SeriousBroker.it  This test is not yet approved or cleared by the [place] FDA and has been authorized for detection and/or diagnosis of SARS-CoV-2 by FDA under an Emergency Use Authorization (EUA). This EUA will remain in effect (meaning this test can be used) for the duration of the COVID-19 declaration under Section 564(b)(1) of the Act, 21 U.S.C. section 360bbb-3(b)(1), unless the authorization is terminated or revoked.  Performed at [facility], [address] [place], [place] [address]   Blood Culture (routine x 2)     Status: Abnormal   Collection Time: 03/24/23  4:13 PM   Specimen: BLOOD  Result Value Ref Range Status   Specimen Description   Final    BLOOD BLOOD RIGHT ARM Performed at [facility], [address] [place], [place] [address]    Special  Requests   Final    BOTTLES DRAWN AEROBIC AND ANAEROBIC Blood Culture results may not be optimal due to an inadequate volume of blood received in culture bottles Performed at [facility], [address] [place], [place] [address]    Culture  Setup Time   Final    GRAM POSITIVE COCCI IN CLUSTERS ANAEROBIC BOTTLE ONLY CRITICAL RESULT CALLED TO, READ BACK BY AND VERIFIED WITH: PHARMD Damaris Hippo [id number] @2255  FH    Culture (A)  Final    STAPHYLOCOCCUS EPIDERMIDIS THE SIGNIFICANCE OF ISOLATING THIS ORGANISM FROM A SINGLE SET OF BLOOD CULTURES WHEN MULTIPLE SETS ARE DRAWN IS UNCERTAIN. PLEASE NOTIFY THE MICROBIOLOGY DEPARTMENT WITHIN ONE WEEK IF SPECIATION AND SENSITIVITIES ARE REQUIRED. Performed at [facility], [address], [place], [place] [address]    Report Status 03/27/2023 FINAL  Final  Blood Culture (routine x 2)     Status: None (Preliminary result)   Collection Time: 03/24/23   4:13 PM   Specimen: BLOOD  Result Value Ref Range Status   Specimen Description   Final    BLOOD LEFT ANTECUBITAL Performed at [facility], [address], [place], [place] [address]    Special Requests   Final    BOTTLES DRAWN AEROBIC AND ANAEROBIC Blood Culture results may not be optimal due to an inadequate volume of blood received in culture bottles Performed at [facility] [address] [place], [place] [address]    Culture   Final    NO GROWTH 3 DAYS Performed at [facility], [address], [place], [place] [address]    Report Status PENDING  Incomplete  Blood Culture ID Panel (Reflexed)     Status: Abnormal   Collection Time: 03/24/23  4:13 PM  Result Value Ref Range Status   Enterococcus faecalis NOT DETECTED NOT DETECTED Final   Enterococcus Faecium NOT DETECTED NOT DETECTED Final   Listeria monocytogenes NOT DETECTED NOT DETECTED Final   Staphylococcus species DETECTED (A) NOT DETECTED Final    Comment: CRITICAL RESULT CALLED TO, READ BACK BY AND VERIFIED WITH: PHARMD Damaris Hippo [id number] @2255  FH    Staphylococcus aureus (BCID) NOT DETECTED NOT DETECTED Final   Staphylococcus epidermidis DETECTED (A) NOT DETECTED Final    Comment: CRITICAL RESULT CALLED TO, READ BACK BY AND VERIFIED WITH: PHARMD Damaris Hippo [id number] @2255  FH    Staphylococcus lugdunensis NOT DETECTED NOT DETECTED Final   Streptococcus species NOT DETECTED NOT DETECTED Final   Streptococcus agalactiae NOT DETECTED NOT DETECTED Final   Streptococcus pneumoniae NOT DETECTED NOT DETECTED Final   Streptococcus pyogenes NOT DETECTED NOT DETECTED Final   A.calcoaceticus-baumannii NOT DETECTED NOT DETECTED Final   Bacteroides fragilis NOT DETECTED NOT DETECTED Final   Enterobacterales NOT DETECTED NOT DETECTED Final   Enterobacter cloacae complex NOT DETECTED NOT DETECTED Final   Escherichia coli NOT DETECTED NOT DETECTED Final   Klebsiella aerogenes NOT  DETECTED NOT DETECTED Final   Klebsiella oxytoca NOT DETECTED NOT DETECTED Final   Klebsiella pneumoniae NOT DETECTED NOT DETECTED Final   Proteus species NOT DETECTED NOT DETECTED Final   Salmonella species NOT DETECTED NOT DETECTED Final   Serratia marcescens NOT DETECTED NOT DETECTED Final   Haemophilus influenzae NOT DETECTED NOT DETECTED Final   Neisseria meningitidis NOT DETECTED NOT DETECTED Final   Pseudomonas aeruginosa NOT DETECTED NOT DETECTED Final   Stenotrophomonas maltophilia NOT DETECTED NOT DETECTED Final   Candida albicans NOT DETECTED NOT DETECTED  Final   Candida auris NOT DETECTED NOT DETECTED Final   Candida glabrata NOT DETECTED NOT DETECTED Final   Candida krusei NOT DETECTED NOT DETECTED Final   Candida parapsilosis NOT DETECTED NOT DETECTED Final   Candida tropicalis NOT DETECTED NOT DETECTED Final   Cryptococcus neoformans/gattii NOT DETECTED NOT DETECTED Final   Methicillin resistance mecA/C NOT DETECTED NOT DETECTED Final    Comment: Performed at [facility], [address] [place], [place] [address]  MRSA Next Gen by PCR, Nasal     Status: None   Collection Time: 03/25/23  6:57 AM   Specimen: Nasal Mucosa; Nasal Swab  Result Value Ref Range Status   MRSA by PCR Next Gen NOT DETECTED NOT DETECTED Final    Comment: (NOTE) The GeneXpert MRSA Assay (FDA approved for NASAL specimens only), is one component of a comprehensive MRSA colonization surveillance program. It is not intended to diagnose MRSA infection nor to guide or monitor treatment for MRSA infections. Test performance is not FDA approved in patients less than 7 years old. Performed at [facility], [address], [place], [place] [address]   C Difficile Quick Screen w PCR reflex     Status: None   Collection Time: 03/26/23 11:07 AM   Specimen: STOOL  Result Value Ref Range Status   C Diff antigen NEGATIVE NEGATIVE Final   C Diff toxin NEGATIVE NEGATIVE Final   C  Diff interpretation No C. difficile detected.  Final    Comment: Performed at [facility], [address] [place], [place] [address]    Procedures/Studies: CT CHEST ABDOMEN PELVIS WO CONTRAST Result Date: 03/24/2023 CLINICAL DATA:  Polytrauma, blunt Polytrauma with pain in head, neck, chest/ab/pelvis, back after falls. Also hypoxic with pneumonia. Fall. Chest and back pain. Cough. EXAM: CT CHEST, ABDOMEN AND PELVIS WITHOUT CONTRAST TECHNIQUE: Multidetector CT imaging of the chest, abdomen and pelvis was performed following the standard protocol without IV contrast. RADIATION DOSE REDUCTION: This exam was performed according to the departmental dose-optimization program which includes automated exposure control, adjustment of the mA and/or kV according to patient size and/or use of iterative reconstruction technique. COMPARISON:  Contrast enhanced CT 09/11/2021 FINDINGS: CT CHEST FINDINGS Cardiovascular: The heart is upper normal in size. Native coronary artery calcifications, post CABG. Aortic atherosclerosis without aneurysm. No pericardial effusion. Mediastinum/Nodes: No mediastinal adenopathy. No thyroid nodule. Small hiatal hernia with patulous esophagus. Lungs/Pleura: Breathing motion artifact. Minimal bandlike atelectasis in the right middle lobe. No evidence of pneumonia or confluent airspace disease. No pleural effusion. Trachea and central airways are patent. Musculoskeletal: Lumbar spine assessed on concurrent lumbar spine chest thoracic spine assessed on concurrent thoracic spine reformats, reported separately prior median sternotomy. No acute fracture of the ribs or shoulder girdles left chest wall loop recorder. CT ABDOMEN PELVIS FINDINGS Hepatobiliary: Lack of contrast limits assessment for hepatic injury. Additionally there is artifact from arms down positioning. Allowing for these limitations, there is no evidence of injury. No perihepatic hematoma. Unremarkable  gallbladder. Pancreas: No evidence of injury. No ductal dilatation or inflammation. Spleen: No evidence of injury on this unenhanced exam. Adrenals/Urinary Tract: No adrenal nodule or hemorrhage. No hydronephrosis or renal injury. No renal calculi. 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 This is a chronic finding. Wall thickening about the bladder dome measuring up to 12 mm on the right. Streak artifact from left hip arthroplasty  obscures detailed bladder assessment. Stomach/Bowel: Small hiatal hernia. The stomach is decompressed. Small duodenal diverticulum. Fluid throughout small bowel without wall thickening or inflammation. Normal appendix. Liquid stool in the proximal colon. Remainder of the colon is decompressed. There is no colonic inflammation. Vascular/Lymphatic: Aortic atherosclerosis. No aneurysm. No enlarged lymph nodes in the abdomen or pelvis. Reproductive: Prostate is unremarkable. Other: No free air or ascites. Diminutive fat containing umbilical hernia. Minimal fat in the inguinal canals. Musculoskeletal: Left hip arthroplasty. No acute pelvic fracture. Sacroiliac joints are partially fused. Lumbar spine is assessed on concurrent lumbar spine reformats, reported separately. IMPRESSION: 1. No evidence of acute traumatic injury. Please note thoracic and lumbar spine reformats/assessment reported separately. 2. Fluid within small bowel and liquid stool in the ascending colon can be seen with diarrheal illness. 3. Small hiatal hernia. 4. Wall thickening about the bladder dome is nonspecific. Aortic Atherosclerosis (ICD10-I70.0). Electronically Signed   By: Narda Rutherford M.D.   On: 03/24/2023 16:49   CT HEAD WO CONTRAST ( ) Result Date: 03/24/2023 CLINICAL DATA:  Polytrauma, blunt EXAM: CT HEAD WITHOUT CONTRAST CT CERVICAL SPINE WITHOUT CONTRAST CT THORACIC SPINE WITHOUT CONTRAST CT  LUMBAR SPINE WITHOUT CONTRAST TECHNIQUE: Multidetector CT imaging of the head, cervical spine, thoracic spine, and lumbar spine was performed following the standard protocol without intravenous contrast. Multiplanar CT image reconstructions of the cervical spine were also generated. RADIATION DOSE REDUCTION: This exam was performed according to the departmental dose-optimization program which includes automated exposure control, adjustment of the mA and/or kV according to patient size and/or use of iterative reconstruction technique. COMPARISON:  09/11/21 CT CAP FINDINGS: CT HEAD FINDINGS Brain: No hemorrhage. No hydrocephalus. No extra-axial fluid collection. No CT evidence of an acute cortical infarct. No mass effect. No mass lesion. Vascular: No hyperdense vessel or unexpected calcification. Skull: Normal. Negative for fracture or focal lesion. Sinuses/Orbits: No middle ear or mastoid effusion. Mucosal thickening in the left sphenoid sinus. Bilateral lens replacement. Orbits are otherwise unremarkable. Other: None. CT CERVICAL SPINE FINDINGS Alignment: Trace retrolisthesis of C3 on C4. Skull base and vertebrae: No acute fracture. No primary bone lesion or focal pathologic process. Soft tissues and spinal canal: No prevertebral fluid or swelling. No visible canal hematoma. Disc levels:  No CT evidence of high-grade spinal canal stenosis. Upper chest: Negative. Other: None CT THORACIC SPINE FINDINGS Alignment: Normal. Vertebrae: No acute fracture or focal pathologic process. Paraspinal and other soft tissues: Negative. Disc levels: No CT evidence of high-grade spinal canal stenosis CT LUMBAR SPINE FINDINGS Segmentation: 5 lumbar type vertebrae. Alignment: Normal. Vertebrae: Minimal superior endplate compression deformity at L1, unchanged from 09/11/2021. Posterior decompression at L3. Paraspinal and other soft tissues: Aortic atherosclerotic calcifications. Disc levels: No CT evidence of high-grade spinal canal  stenosis. IMPRESSION: 1. No CT evidence of intracranial injury. 2. No acute fracture or traumatic malalignment of the cervical, thoracic, or lumbar spine. Aortic Atherosclerosis (ICD10-I70.0). Electronically Signed   By: Lorenza Cambridge M.D.   On: 03/24/2023 16:43   CT Cervical Spine Wo Contrast Result Date: 03/24/2023 CLINICAL DATA:  Polytrauma, blunt EXAM: CT HEAD WITHOUT CONTRAST CT CERVICAL SPINE WITHOUT CONTRAST CT THORACIC SPINE WITHOUT CONTRAST CT LUMBAR SPINE WITHOUT CONTRAST TECHNIQUE: Multidetector CT imaging of the head, cervical spine, thoracic spine, and lumbar spine was performed following the standard protocol without intravenous contrast. Multiplanar CT image reconstructions of the cervical spine were also generated. RADIATION DOSE REDUCTION: This exam was performed according to the departmental dose-optimization program which includes automated exposure control, adjustment of  the mA and/or kV according to patient size and/or use of iterative reconstruction technique. COMPARISON:  09/11/21 CT CAP FINDINGS: CT HEAD FINDINGS Brain: No hemorrhage. No hydrocephalus. No extra-axial fluid collection. No CT evidence of an acute cortical infarct. No mass effect. No mass lesion. Vascular: No hyperdense vessel or unexpected calcification. Skull: Normal. Negative for fracture or focal lesion. Sinuses/Orbits: No middle ear or mastoid effusion. Mucosal thickening in the left sphenoid sinus. Bilateral lens replacement. Orbits are otherwise unremarkable. Other: None. CT CERVICAL SPINE FINDINGS Alignment: Trace retrolisthesis of C3 on C4. Skull base and vertebrae: No acute fracture. No primary bone lesion or focal pathologic process. Soft tissues and spinal canal: No prevertebral fluid or swelling. No visible canal hematoma. Disc levels:  No CT evidence of high-grade spinal canal stenosis. Upper chest: Negative. Other: None CT THORACIC SPINE FINDINGS Alignment: Normal. Vertebrae: No acute fracture or focal pathologic  process. Paraspinal and other soft tissues: Negative. Disc levels: No CT evidence of high-grade spinal canal stenosis CT LUMBAR SPINE FINDINGS Segmentation: 5 lumbar type vertebrae. Alignment: Normal. Vertebrae: Minimal superior endplate compression deformity at L1, unchanged from 09/11/2021. Posterior decompression at L3. Paraspinal and other soft tissues: Aortic atherosclerotic calcifications. Disc levels: No CT evidence of high-grade spinal canal stenosis. IMPRESSION: 1. No CT evidence of intracranial injury. 2. No acute fracture or traumatic malalignment of the cervical, thoracic, or lumbar spine. Aortic Atherosclerosis (ICD10-I70.0). Electronically Signed   By: Lorenza Cambridge M.D.   On: 03/24/2023 16:43   CT L-SPINE NO CHARGE Result Date: 03/24/2023 CLINICAL DATA:  Polytrauma, blunt EXAM: CT HEAD WITHOUT CONTRAST CT CERVICAL SPINE WITHOUT CONTRAST CT THORACIC SPINE WITHOUT CONTRAST CT LUMBAR SPINE WITHOUT CONTRAST TECHNIQUE: Multidetector CT imaging of the head, cervical spine, thoracic spine, and lumbar spine was performed following the standard protocol without intravenous contrast. Multiplanar CT image reconstructions of the cervical spine were also generated. RADIATION DOSE REDUCTION: This exam was performed according to the departmental dose-optimization program which includes automated exposure control, adjustment of the mA and/or kV according to patient size and/or use of iterative reconstruction technique. COMPARISON:  09/11/21 CT CAP FINDINGS: CT HEAD FINDINGS Brain: No hemorrhage. No hydrocephalus. No extra-axial fluid collection. No CT evidence of an acute cortical infarct. No mass effect. No mass lesion. Vascular: No hyperdense vessel or unexpected calcification. Skull: Normal. Negative for fracture or focal lesion. Sinuses/Orbits: No middle ear or mastoid effusion. Mucosal thickening in the left sphenoid sinus. Bilateral lens replacement. Orbits are otherwise unremarkable. Other: None. CT CERVICAL  SPINE FINDINGS Alignment: Trace retrolisthesis of C3 on C4. Skull base and vertebrae: No acute fracture. No primary bone lesion or focal pathologic process. Soft tissues and spinal canal: No prevertebral fluid or swelling. No visible canal hematoma. Disc levels:  No CT evidence of high-grade spinal canal stenosis. Upper chest: Negative. Other: None CT THORACIC SPINE FINDINGS Alignment: Normal. Vertebrae: No acute fracture or focal pathologic process. Paraspinal and other soft tissues: Negative. Disc levels: No CT evidence of high-grade spinal canal stenosis CT LUMBAR SPINE FINDINGS Segmentation: 5 lumbar type vertebrae. Alignment: Normal. Vertebrae: Minimal superior endplate compression deformity at L1, unchanged from 09/11/2021. Posterior decompression at L3. Paraspinal and other soft tissues: Aortic atherosclerotic calcifications. Disc levels: No CT evidence of high-grade spinal canal stenosis. IMPRESSION: 1. No CT evidence of intracranial injury. 2. No acute fracture or traumatic malalignment of the cervical, thoracic, or lumbar spine. Aortic Atherosclerosis (ICD10-I70.0). Electronically Signed   By: Lorenza Cambridge M.D.   On: 03/24/2023 16:43   CT T-SPINE  NO CHARGE Result Date: 03/24/2023 CLINICAL DATA:  Polytrauma, blunt EXAM: CT HEAD WITHOUT CONTRAST CT CERVICAL SPINE WITHOUT CONTRAST CT THORACIC SPINE WITHOUT CONTRAST CT LUMBAR SPINE WITHOUT CONTRAST TECHNIQUE: Multidetector CT imaging of the head, cervical spine, thoracic spine, and lumbar spine was performed following the standard protocol without intravenous contrast. Multiplanar CT image reconstructions of the cervical spine were also generated. RADIATION DOSE REDUCTION: This exam was performed according to the departmental dose-optimization program which includes automated exposure control, adjustment of the mA and/or kV according to patient size and/or use of iterative reconstruction technique. COMPARISON:  09/11/21 CT CAP FINDINGS: CT HEAD FINDINGS  Brain: No hemorrhage. No hydrocephalus. No extra-axial fluid collection. No CT evidence of an acute cortical infarct. No mass effect. No mass lesion. Vascular: No hyperdense vessel or unexpected calcification. Skull: Normal. Negative for fracture or focal lesion. Sinuses/Orbits: No middle ear or mastoid effusion. Mucosal thickening in the left sphenoid sinus. Bilateral lens replacement. Orbits are otherwise unremarkable. Other: None. CT CERVICAL SPINE FINDINGS Alignment: Trace retrolisthesis of C3 on C4. Skull base and vertebrae: No acute fracture. No primary bone lesion or focal pathologic process. Soft tissues and spinal canal: No prevertebral fluid or swelling. No visible canal hematoma. Disc levels:  No CT evidence of high-grade spinal canal stenosis. Upper chest: Negative. Other: None CT THORACIC SPINE FINDINGS Alignment: Normal. Vertebrae: No acute fracture or focal pathologic process. Paraspinal and other soft tissues: Negative. Disc levels: No CT evidence of high-grade spinal canal stenosis CT LUMBAR SPINE FINDINGS Segmentation: 5 lumbar type vertebrae. Alignment: Normal. Vertebrae: Minimal superior endplate compression deformity at L1, unchanged from 09/11/2021. Posterior decompression at L3. Paraspinal and other soft tissues: Aortic atherosclerotic calcifications. Disc levels: No CT evidence of high-grade spinal canal stenosis. IMPRESSION: 1. No CT evidence of intracranial injury. 2. No acute fracture or traumatic malalignment of the cervical, thoracic, or lumbar spine. Aortic Atherosclerosis (ICD10-I70.0). Electronically Signed   By: Lorenza Cambridge M.D.   On: 03/24/2023 16:43   DG Chest Port 1 View Result Date: 03/24/2023 CLINICAL DATA:  Questionable sepsis - evaluate for abnormality Chest and back pain.  Cough. EXAM: PORTABLE CHEST 1 VIEW COMPARISON:  Same day chest CT, available at time of radiograph interpretation. FINDINGS: Prior median sternotomy and CABG. Left chest wall loop recorder.The  cardiomediastinal contours are normal. Pulmonary vasculature is normal. No consolidation, pleural effusion, or pneumothorax. No acute osseous abnormalities are seen. IMPRESSION: No active disease. Electronically Signed   By: Narda Rutherford M.D.   On: 03/24/2023 16:38     Time coordinating discharge: Over 30 minutes    Lewie Chamber, MD  [facility] 03/27/2023, 1:05 PM

## 2023-03-27 NOTE — Assessment & Plan Note (Signed)
-   1/4 bottles growing staph epi from 1/14; considered contaminate - no further workup

## 2023-03-29 LAB — CULTURE, BLOOD (ROUTINE X 2): Culture: NO GROWTH

## 2023-03-31 DIAGNOSIS — I2510 Atherosclerotic heart disease of native coronary artery without angina pectoris: Secondary | ICD-10-CM | POA: Diagnosis not present

## 2023-03-31 DIAGNOSIS — A09 Infectious gastroenteritis and colitis, unspecified: Secondary | ICD-10-CM | POA: Diagnosis not present

## 2023-03-31 DIAGNOSIS — A419 Sepsis, unspecified organism: Secondary | ICD-10-CM | POA: Diagnosis not present

## 2023-03-31 DIAGNOSIS — E2740 Unspecified adrenocortical insufficiency: Secondary | ICD-10-CM | POA: Diagnosis not present

## 2023-03-31 DIAGNOSIS — N179 Acute kidney failure, unspecified: Secondary | ICD-10-CM | POA: Diagnosis not present

## 2023-03-31 DIAGNOSIS — E785 Hyperlipidemia, unspecified: Secondary | ICD-10-CM | POA: Diagnosis not present

## 2023-04-02 DIAGNOSIS — R197 Diarrhea, unspecified: Secondary | ICD-10-CM | POA: Diagnosis not present

## 2023-04-06 DIAGNOSIS — M51372 Other intervertebral disc degeneration, lumbosacral region with discogenic back pain and lower extremity pain: Secondary | ICD-10-CM | POA: Diagnosis not present

## 2023-04-06 DIAGNOSIS — M5417 Radiculopathy, lumbosacral region: Secondary | ICD-10-CM | POA: Diagnosis not present

## 2023-04-06 DIAGNOSIS — M9905 Segmental and somatic dysfunction of pelvic region: Secondary | ICD-10-CM | POA: Diagnosis not present

## 2023-04-06 DIAGNOSIS — M5031 Other cervical disc degeneration,  high cervical region: Secondary | ICD-10-CM | POA: Diagnosis not present

## 2023-04-06 DIAGNOSIS — M50322 Other cervical disc degeneration at C5-C6 level: Secondary | ICD-10-CM | POA: Diagnosis not present

## 2023-04-06 DIAGNOSIS — M9902 Segmental and somatic dysfunction of thoracic region: Secondary | ICD-10-CM | POA: Diagnosis not present

## 2023-04-06 DIAGNOSIS — M9901 Segmental and somatic dysfunction of cervical region: Secondary | ICD-10-CM | POA: Diagnosis not present

## 2023-04-06 DIAGNOSIS — M9903 Segmental and somatic dysfunction of lumbar region: Secondary | ICD-10-CM | POA: Diagnosis not present

## 2023-04-06 DIAGNOSIS — M4312 Spondylolisthesis, cervical region: Secondary | ICD-10-CM | POA: Diagnosis not present

## 2023-04-06 DIAGNOSIS — M9904 Segmental and somatic dysfunction of sacral region: Secondary | ICD-10-CM | POA: Diagnosis not present

## 2023-04-06 DIAGNOSIS — M51362 Other intervertebral disc degeneration, lumbar region with discogenic back pain and lower extremity pain: Secondary | ICD-10-CM | POA: Diagnosis not present

## 2023-04-07 ENCOUNTER — Encounter (INDEPENDENT_AMBULATORY_CARE_PROVIDER_SITE_OTHER): Payer: PPO | Admitting: Ophthalmology

## 2023-04-07 DIAGNOSIS — H34832 Tributary (branch) retinal vein occlusion, left eye, with macular edema: Secondary | ICD-10-CM | POA: Diagnosis not present

## 2023-04-07 DIAGNOSIS — H43813 Vitreous degeneration, bilateral: Secondary | ICD-10-CM | POA: Diagnosis not present

## 2023-04-07 DIAGNOSIS — I10 Essential (primary) hypertension: Secondary | ICD-10-CM

## 2023-04-07 DIAGNOSIS — H35033 Hypertensive retinopathy, bilateral: Secondary | ICD-10-CM

## 2023-04-09 DIAGNOSIS — M9902 Segmental and somatic dysfunction of thoracic region: Secondary | ICD-10-CM | POA: Diagnosis not present

## 2023-04-09 DIAGNOSIS — M9904 Segmental and somatic dysfunction of sacral region: Secondary | ICD-10-CM | POA: Diagnosis not present

## 2023-04-09 DIAGNOSIS — M51362 Other intervertebral disc degeneration, lumbar region with discogenic back pain and lower extremity pain: Secondary | ICD-10-CM | POA: Diagnosis not present

## 2023-04-09 DIAGNOSIS — M5031 Other cervical disc degeneration,  high cervical region: Secondary | ICD-10-CM | POA: Diagnosis not present

## 2023-04-09 DIAGNOSIS — M9905 Segmental and somatic dysfunction of pelvic region: Secondary | ICD-10-CM | POA: Diagnosis not present

## 2023-04-09 DIAGNOSIS — M9901 Segmental and somatic dysfunction of cervical region: Secondary | ICD-10-CM | POA: Diagnosis not present

## 2023-04-09 DIAGNOSIS — M50322 Other cervical disc degeneration at C5-C6 level: Secondary | ICD-10-CM | POA: Diagnosis not present

## 2023-04-09 DIAGNOSIS — M5417 Radiculopathy, lumbosacral region: Secondary | ICD-10-CM | POA: Diagnosis not present

## 2023-04-09 DIAGNOSIS — M4312 Spondylolisthesis, cervical region: Secondary | ICD-10-CM | POA: Diagnosis not present

## 2023-04-09 DIAGNOSIS — M9903 Segmental and somatic dysfunction of lumbar region: Secondary | ICD-10-CM | POA: Diagnosis not present

## 2023-04-09 DIAGNOSIS — M51372 Other intervertebral disc degeneration, lumbosacral region with discogenic back pain and lower extremity pain: Secondary | ICD-10-CM | POA: Diagnosis not present

## 2023-04-13 DIAGNOSIS — M5417 Radiculopathy, lumbosacral region: Secondary | ICD-10-CM | POA: Diagnosis not present

## 2023-04-13 DIAGNOSIS — M9901 Segmental and somatic dysfunction of cervical region: Secondary | ICD-10-CM | POA: Diagnosis not present

## 2023-04-13 DIAGNOSIS — M9902 Segmental and somatic dysfunction of thoracic region: Secondary | ICD-10-CM | POA: Diagnosis not present

## 2023-04-13 DIAGNOSIS — M4312 Spondylolisthesis, cervical region: Secondary | ICD-10-CM | POA: Diagnosis not present

## 2023-04-13 DIAGNOSIS — M9905 Segmental and somatic dysfunction of pelvic region: Secondary | ICD-10-CM | POA: Diagnosis not present

## 2023-04-13 DIAGNOSIS — M51372 Other intervertebral disc degeneration, lumbosacral region with discogenic back pain and lower extremity pain: Secondary | ICD-10-CM | POA: Diagnosis not present

## 2023-04-13 DIAGNOSIS — M9904 Segmental and somatic dysfunction of sacral region: Secondary | ICD-10-CM | POA: Diagnosis not present

## 2023-04-13 DIAGNOSIS — M9903 Segmental and somatic dysfunction of lumbar region: Secondary | ICD-10-CM | POA: Diagnosis not present

## 2023-04-13 DIAGNOSIS — M50322 Other cervical disc degeneration at C5-C6 level: Secondary | ICD-10-CM | POA: Diagnosis not present

## 2023-04-13 DIAGNOSIS — M5031 Other cervical disc degeneration,  high cervical region: Secondary | ICD-10-CM | POA: Diagnosis not present

## 2023-04-13 DIAGNOSIS — M51362 Other intervertebral disc degeneration, lumbar region with discogenic back pain and lower extremity pain: Secondary | ICD-10-CM | POA: Diagnosis not present

## 2023-04-15 DIAGNOSIS — M9901 Segmental and somatic dysfunction of cervical region: Secondary | ICD-10-CM | POA: Diagnosis not present

## 2023-04-15 DIAGNOSIS — M5031 Other cervical disc degeneration,  high cervical region: Secondary | ICD-10-CM | POA: Diagnosis not present

## 2023-04-15 DIAGNOSIS — M51362 Other intervertebral disc degeneration, lumbar region with discogenic back pain and lower extremity pain: Secondary | ICD-10-CM | POA: Diagnosis not present

## 2023-04-15 DIAGNOSIS — M9903 Segmental and somatic dysfunction of lumbar region: Secondary | ICD-10-CM | POA: Diagnosis not present

## 2023-04-15 DIAGNOSIS — M50322 Other cervical disc degeneration at C5-C6 level: Secondary | ICD-10-CM | POA: Diagnosis not present

## 2023-04-15 DIAGNOSIS — M9905 Segmental and somatic dysfunction of pelvic region: Secondary | ICD-10-CM | POA: Diagnosis not present

## 2023-04-15 DIAGNOSIS — M4312 Spondylolisthesis, cervical region: Secondary | ICD-10-CM | POA: Diagnosis not present

## 2023-04-15 DIAGNOSIS — M51372 Other intervertebral disc degeneration, lumbosacral region with discogenic back pain and lower extremity pain: Secondary | ICD-10-CM | POA: Diagnosis not present

## 2023-04-15 DIAGNOSIS — M5417 Radiculopathy, lumbosacral region: Secondary | ICD-10-CM | POA: Diagnosis not present

## 2023-04-15 DIAGNOSIS — M9904 Segmental and somatic dysfunction of sacral region: Secondary | ICD-10-CM | POA: Diagnosis not present

## 2023-04-15 DIAGNOSIS — M9902 Segmental and somatic dysfunction of thoracic region: Secondary | ICD-10-CM | POA: Diagnosis not present

## 2023-04-17 DIAGNOSIS — M25561 Pain in right knee: Secondary | ICD-10-CM | POA: Diagnosis not present

## 2023-04-21 DIAGNOSIS — M9903 Segmental and somatic dysfunction of lumbar region: Secondary | ICD-10-CM | POA: Diagnosis not present

## 2023-04-21 DIAGNOSIS — M9904 Segmental and somatic dysfunction of sacral region: Secondary | ICD-10-CM | POA: Diagnosis not present

## 2023-04-21 DIAGNOSIS — M5417 Radiculopathy, lumbosacral region: Secondary | ICD-10-CM | POA: Diagnosis not present

## 2023-04-21 DIAGNOSIS — M9902 Segmental and somatic dysfunction of thoracic region: Secondary | ICD-10-CM | POA: Diagnosis not present

## 2023-04-21 DIAGNOSIS — M9905 Segmental and somatic dysfunction of pelvic region: Secondary | ICD-10-CM | POA: Diagnosis not present

## 2023-04-21 DIAGNOSIS — M4312 Spondylolisthesis, cervical region: Secondary | ICD-10-CM | POA: Diagnosis not present

## 2023-04-21 DIAGNOSIS — M51362 Other intervertebral disc degeneration, lumbar region with discogenic back pain and lower extremity pain: Secondary | ICD-10-CM | POA: Diagnosis not present

## 2023-04-21 DIAGNOSIS — M9901 Segmental and somatic dysfunction of cervical region: Secondary | ICD-10-CM | POA: Diagnosis not present

## 2023-04-21 DIAGNOSIS — M5031 Other cervical disc degeneration,  high cervical region: Secondary | ICD-10-CM | POA: Diagnosis not present

## 2023-04-21 DIAGNOSIS — M51372 Other intervertebral disc degeneration, lumbosacral region with discogenic back pain and lower extremity pain: Secondary | ICD-10-CM | POA: Diagnosis not present

## 2023-04-21 DIAGNOSIS — M50322 Other cervical disc degeneration at C5-C6 level: Secondary | ICD-10-CM | POA: Diagnosis not present

## 2023-04-23 DIAGNOSIS — M4312 Spondylolisthesis, cervical region: Secondary | ICD-10-CM | POA: Diagnosis not present

## 2023-04-23 DIAGNOSIS — M9902 Segmental and somatic dysfunction of thoracic region: Secondary | ICD-10-CM | POA: Diagnosis not present

## 2023-04-23 DIAGNOSIS — M9903 Segmental and somatic dysfunction of lumbar region: Secondary | ICD-10-CM | POA: Diagnosis not present

## 2023-04-23 DIAGNOSIS — M51372 Other intervertebral disc degeneration, lumbosacral region with discogenic back pain and lower extremity pain: Secondary | ICD-10-CM | POA: Diagnosis not present

## 2023-04-23 DIAGNOSIS — M5031 Other cervical disc degeneration,  high cervical region: Secondary | ICD-10-CM | POA: Diagnosis not present

## 2023-04-23 DIAGNOSIS — M5417 Radiculopathy, lumbosacral region: Secondary | ICD-10-CM | POA: Diagnosis not present

## 2023-04-23 DIAGNOSIS — M51362 Other intervertebral disc degeneration, lumbar region with discogenic back pain and lower extremity pain: Secondary | ICD-10-CM | POA: Diagnosis not present

## 2023-04-23 DIAGNOSIS — M9905 Segmental and somatic dysfunction of pelvic region: Secondary | ICD-10-CM | POA: Diagnosis not present

## 2023-04-23 DIAGNOSIS — M9904 Segmental and somatic dysfunction of sacral region: Secondary | ICD-10-CM | POA: Diagnosis not present

## 2023-04-23 DIAGNOSIS — M9901 Segmental and somatic dysfunction of cervical region: Secondary | ICD-10-CM | POA: Diagnosis not present

## 2023-04-23 DIAGNOSIS — M50322 Other cervical disc degeneration at C5-C6 level: Secondary | ICD-10-CM | POA: Diagnosis not present

## 2023-04-27 ENCOUNTER — Other Ambulatory Visit: Payer: Self-pay | Admitting: Cardiology

## 2023-04-27 DIAGNOSIS — I25118 Atherosclerotic heart disease of native coronary artery with other forms of angina pectoris: Secondary | ICD-10-CM

## 2023-04-28 DIAGNOSIS — M9901 Segmental and somatic dysfunction of cervical region: Secondary | ICD-10-CM | POA: Diagnosis not present

## 2023-04-28 DIAGNOSIS — M9904 Segmental and somatic dysfunction of sacral region: Secondary | ICD-10-CM | POA: Diagnosis not present

## 2023-04-28 DIAGNOSIS — M50322 Other cervical disc degeneration at C5-C6 level: Secondary | ICD-10-CM | POA: Diagnosis not present

## 2023-04-28 DIAGNOSIS — M4312 Spondylolisthesis, cervical region: Secondary | ICD-10-CM | POA: Diagnosis not present

## 2023-04-28 DIAGNOSIS — M51372 Other intervertebral disc degeneration, lumbosacral region with discogenic back pain and lower extremity pain: Secondary | ICD-10-CM | POA: Diagnosis not present

## 2023-04-28 DIAGNOSIS — M9905 Segmental and somatic dysfunction of pelvic region: Secondary | ICD-10-CM | POA: Diagnosis not present

## 2023-04-28 DIAGNOSIS — M5031 Other cervical disc degeneration,  high cervical region: Secondary | ICD-10-CM | POA: Diagnosis not present

## 2023-04-28 DIAGNOSIS — M9902 Segmental and somatic dysfunction of thoracic region: Secondary | ICD-10-CM | POA: Diagnosis not present

## 2023-04-28 DIAGNOSIS — M9903 Segmental and somatic dysfunction of lumbar region: Secondary | ICD-10-CM | POA: Diagnosis not present

## 2023-04-28 DIAGNOSIS — M51362 Other intervertebral disc degeneration, lumbar region with discogenic back pain and lower extremity pain: Secondary | ICD-10-CM | POA: Diagnosis not present

## 2023-04-28 DIAGNOSIS — M5417 Radiculopathy, lumbosacral region: Secondary | ICD-10-CM | POA: Diagnosis not present

## 2023-05-07 ENCOUNTER — Encounter (INDEPENDENT_AMBULATORY_CARE_PROVIDER_SITE_OTHER): Payer: PPO | Admitting: Ophthalmology

## 2023-05-07 DIAGNOSIS — H34832 Tributary (branch) retinal vein occlusion, left eye, with macular edema: Secondary | ICD-10-CM

## 2023-05-07 DIAGNOSIS — I10 Essential (primary) hypertension: Secondary | ICD-10-CM

## 2023-05-07 DIAGNOSIS — H43813 Vitreous degeneration, bilateral: Secondary | ICD-10-CM

## 2023-05-07 DIAGNOSIS — H35033 Hypertensive retinopathy, bilateral: Secondary | ICD-10-CM

## 2023-05-12 DIAGNOSIS — M51372 Other intervertebral disc degeneration, lumbosacral region with discogenic back pain and lower extremity pain: Secondary | ICD-10-CM | POA: Diagnosis not present

## 2023-05-12 DIAGNOSIS — M5417 Radiculopathy, lumbosacral region: Secondary | ICD-10-CM | POA: Diagnosis not present

## 2023-05-12 DIAGNOSIS — M9901 Segmental and somatic dysfunction of cervical region: Secondary | ICD-10-CM | POA: Diagnosis not present

## 2023-05-12 DIAGNOSIS — M9903 Segmental and somatic dysfunction of lumbar region: Secondary | ICD-10-CM | POA: Diagnosis not present

## 2023-05-12 DIAGNOSIS — M9902 Segmental and somatic dysfunction of thoracic region: Secondary | ICD-10-CM | POA: Diagnosis not present

## 2023-05-12 DIAGNOSIS — M4312 Spondylolisthesis, cervical region: Secondary | ICD-10-CM | POA: Diagnosis not present

## 2023-05-12 DIAGNOSIS — M9905 Segmental and somatic dysfunction of pelvic region: Secondary | ICD-10-CM | POA: Diagnosis not present

## 2023-05-12 DIAGNOSIS — M50322 Other cervical disc degeneration at C5-C6 level: Secondary | ICD-10-CM | POA: Diagnosis not present

## 2023-05-12 DIAGNOSIS — M51362 Other intervertebral disc degeneration, lumbar region with discogenic back pain and lower extremity pain: Secondary | ICD-10-CM | POA: Diagnosis not present

## 2023-05-12 DIAGNOSIS — M9904 Segmental and somatic dysfunction of sacral region: Secondary | ICD-10-CM | POA: Diagnosis not present

## 2023-05-12 DIAGNOSIS — M5031 Other cervical disc degeneration,  high cervical region: Secondary | ICD-10-CM | POA: Diagnosis not present

## 2023-05-15 DIAGNOSIS — M25552 Pain in left hip: Secondary | ICD-10-CM | POA: Diagnosis not present

## 2023-05-15 DIAGNOSIS — M25561 Pain in right knee: Secondary | ICD-10-CM | POA: Diagnosis not present

## 2023-05-19 ENCOUNTER — Telehealth: Payer: Self-pay

## 2023-05-19 NOTE — Telephone Encounter (Signed)
 I called the pt he states the monitor is plugged in. It says ok. I had him unplug it and plug it back in to see if it will work. I told him I will check tomorrow and if it still not working I will call [employer] to get help with his monitor.

## 2023-05-19 NOTE — Telephone Encounter (Signed)
 Noted, thank you

## 2023-05-25 DIAGNOSIS — L570 Actinic keratosis: Secondary | ICD-10-CM | POA: Diagnosis not present

## 2023-05-25 DIAGNOSIS — L738 Other specified follicular disorders: Secondary | ICD-10-CM | POA: Diagnosis not present

## 2023-05-25 DIAGNOSIS — L820 Inflamed seborrheic keratosis: Secondary | ICD-10-CM | POA: Diagnosis not present

## 2023-05-25 DIAGNOSIS — Z85828 Personal history of other malignant neoplasm of skin: Secondary | ICD-10-CM | POA: Diagnosis not present

## 2023-05-25 DIAGNOSIS — L821 Other seborrheic keratosis: Secondary | ICD-10-CM | POA: Diagnosis not present

## 2023-05-31 NOTE — Progress Notes (Unsigned)
 Electrophysiology Office Note:   Date:  06/01/2023  ID:  DAVED MCFANN, DOB 11/03/1945, MRN [id number]  Primary Cardiologist: Yates Decamp, MD Primary Heart Failure: None Electrophysiologist: None      History of Present Illness:   [name] is a 78 y.o. male with h/o LAFB, CAD s/p 3 stent & CABG, HTN, HLD, CKD IIIa, adrenal insufficiency & ILR monitor that was inserted for syncope / collapse seen today for routine electrophysiology followup.   Admit to hospital from 1/14-1/17/25 with N/V/D, hypovolemia & hypotension.   Since last being seen in our clinic the patient reports he has ongoing lightheadedness with positional changes. He has chest discomfort that he has been taking NTG for during the week (most recent 1 week ago) and he does not tell his wife he has been taking it. He describes it as a central pressure. He notes he has not gotten back to "normal" after having the norovirus in January.  He continues to have frequent stools intermittently.  His blood pressure was low this am 80's systolic but came up after he took his midodrine.      He denies chest pain, palpitations, dyspnea, PND, orthopnea, nausea, vomiting, dizziness, syncope, edema, weight gain, or early satiety.   Review of systems complete and found to be negative unless listed in HPI.   EP Information / Studies Reviewed:    EKG is not ordered today. EKG from 03/26/23 reviewed which showed ST with PAC's      Studies:  LHC 11/2022 > multi-vessel disease, difficult PCI  ECHO 11/2022 > LVEF 60-65%, no RWMA, G1DD, LA mildly dilated  Device  [employer] ILR, implanted 04/09/21 for syncope / collapse  Risk Assessment/Calculations:              Physical Exam:   VS:  BP 120/76   Pulse (!) 57   Ht 5\' 7"  (1.702 m)   Wt 187 lb (84.8 kg)   SpO2 98%   BMI 29.29 kg/m    Wt Readings from Last 3 Encounters:  06/01/23 187 lb (84.8 kg)  03/24/23 185 lb (83.9 kg)  02/03/23 197 lb 3.2 oz (89.4 kg)     GEN: Well  nourished, well developed in no acute distress NECK: No JVD; No carotid bruits CARDIAC: Regular rate and rhythm, no murmurs, rubs, gallops RESPIRATORY:  Clear to auscultation without rales, wheezing or rhonchi  ABDOMEN: Soft, non-tender, non-distended EXTREMITIES:  No edema; No deformity   ASSESSMENT AND PLAN:    Hx Syncope  Dizziness / Lightheadedness S/p [employer] ILR  -ILR implanted in 2022 and is non-functional > unable to connect with 2 different programmers, selected the type of device and no interrogation. Confirmed it is a [employer] III by his card. Reviewed with industry and tech support.   -will arrange for removal of device and re-implantation  -short term monitor in the interim with dizziness / lightheadedness but suspect more related to orthostatic / chronic hypotension -Industry wants the loop once extracted to review for issues  Hx CAD s/p CABG  -chest pressure intermittently  -LHC 11/2022 with difficult PCI  -assess Lexiscan Myoview given ongoing symptoms  -pt to see Dr. Jacinto Halim as scheduled in April    Chronic Hypotension  Hx HTN Hx Adrenal Insufficiency  -on midodrine  -metoprolol XL 50 mg daily    Informed Consent   Shared Decision Making/Informed Consent The risks [chest pain, shortness of breath, cardiac arrhythmias, dizziness, blood pressure fluctuations, myocardial infarction, stroke/transient ischemic attack, nausea, vomiting, allergic  reaction, radiation exposure, metallic taste sensation and life-threatening complications (estimated to be 1 in 10,000)], benefits (risk stratification, diagnosing coronary artery disease, treatment guidance) and alternatives of a nuclear stress test were discussed in detail with Mr. Eifler and he agrees to proceed.      Follow up with Dr. Jimmey Ralph  as planned on 5/23 and with Dr. Jacinto Halim    Signed, Canary Brim, NP-C, AGACNP-BC [facility] - Electrophysiology  06/01/2023, 12:27 PM

## 2023-06-01 ENCOUNTER — Encounter: Payer: Self-pay | Admitting: Pulmonary Disease

## 2023-06-01 ENCOUNTER — Other Ambulatory Visit: Payer: Self-pay | Admitting: Pulmonary Disease

## 2023-06-01 ENCOUNTER — Ambulatory Visit (INDEPENDENT_AMBULATORY_CARE_PROVIDER_SITE_OTHER)

## 2023-06-01 ENCOUNTER — Encounter: Payer: Self-pay | Admitting: *Deleted

## 2023-06-01 ENCOUNTER — Encounter (HOSPITAL_COMMUNITY): Payer: Self-pay

## 2023-06-01 ENCOUNTER — Ambulatory Visit: Attending: Pulmonary Disease | Admitting: Pulmonary Disease

## 2023-06-01 VITALS — BP 120/76 | HR 57 | Ht 67.0 in | Wt 187.0 lb

## 2023-06-01 DIAGNOSIS — R55 Syncope and collapse: Secondary | ICD-10-CM

## 2023-06-01 DIAGNOSIS — R072 Precordial pain: Secondary | ICD-10-CM

## 2023-06-01 DIAGNOSIS — I10 Essential (primary) hypertension: Secondary | ICD-10-CM

## 2023-06-01 DIAGNOSIS — I25118 Atherosclerotic heart disease of native coronary artery with other forms of angina pectoris: Secondary | ICD-10-CM

## 2023-06-01 DIAGNOSIS — I959 Hypotension, unspecified: Secondary | ICD-10-CM | POA: Diagnosis not present

## 2023-06-01 NOTE — Patient Instructions (Signed)
 Medication Instructions:  Your physician recommends that you continue on your current medications as directed. Please refer to the Current Medication list given to you today.  *If you need a refill on your cardiac medications before your next appointment, please call your pharmacy*  Lab Work: None ordered If you have labs (blood work) drawn today and your tests are completely normal, you will receive your results only by: MyChart Message (if you have MyChart) OR A paper copy in the mail If you have any lab test that is abnormal or we need to change your treatment, we will call you to review the results.  Testing/Procedures: Your physician has requested that you have a lexiscan myoview. For further information please visit [email or web address] Please follow instruction sheet, as given.  ZIO XT- Long Term Monitor Instructions  Your physician has requested you wear a ZIO patch monitor for 7 days.  This is a single patch monitor. [employer] supplies one patch monitor per enrollment. Additional stickers are not available. Please do not apply patch if you will be having a Nuclear Stress Test,  Echocardiogram, Cardiac CT, MRI, or Chest Xray during the period you would be wearing the  monitor. The patch cannot be worn during these tests. You cannot remove and re-apply the  ZIO XT patch monitor.  Your ZIO patch monitor will be mailed 3 day USPS to your address on file. It may take 3-5 days  to receive your monitor after you have been enrolled.  Once you have received your monitor, please review the enclosed instructions. Your monitor  has already been registered assigning a specific monitor serial # to you.  Billing and Patient Assistance Program Information  We have supplied [employer] with any of your insurance information on file for billing purposes. [employer] offers a sliding scale Patient Assistance Program for patients that do not have  insurance, or whose insurance does not completely cover the  cost of the ZIO monitor.  You must apply for the Patient Assistance Program to qualify for this discounted rate.  To apply, please call [employer] at [phone], select option 4, select option 2, ask to apply for  Patient Assistance Program. Meredeth Ide will ask your household income, and how many people  are in your household. They will quote your out-of-pocket cost based on that information.  [employer] will also be able to set up a 68-month, interest-free payment plan if needed.  Applying the monitor   Shave hair from upper left chest.  Hold abrader disc by orange tab. Rub abrader in 40 strokes over the upper left chest as  indicated in your monitor instructions.  Clean area with 4 enclosed alcohol pads. Let dry.  Apply patch as indicated in monitor instructions. Patch will be placed under collarbone on left  side of chest with arrow pointing upward.  Rub patch adhesive wings for 2 minutes. Remove white label marked "1". Remove the white  label marked "2". Rub patch adhesive wings for 2 additional minutes.  While looking in a mirror, press and release button in center of patch. A small green light will  flash 3-4 times. This will be your only indicator that the monitor has been turned on.  Do not shower for the first 24 hours. You may shower after the first 24 hours.  Press the button if you feel a symptom. You will hear a small click. Record Date, Time and  Symptom in the Patient Logbook.  When you are ready to remove the patch, follow instructions on the  last 2 pages of Patient  Logbook. Stick patch monitor onto the last page of Patient Logbook.  Place Patient Logbook in the blue and white box. Use locking tab on box and tape box closed  securely. The blue and white box has prepaid postage on it. Please place it in the mailbox as  soon as possible. Your physician should have your test results approximately 7 days after the  monitor has been mailed back to [facility].  Call [facility]  Customer Care at [phone] if you have questions regarding  your ZIO XT patch monitor. Call them immediately if you see an orange light blinking on your  monitor.  If your monitor falls off in less than 4 days, contact our Monitor department at [phone].  If your monitor becomes loose or falls off after 4 days call [employer] at [phone] for  suggestions on securing your monitor   Follow-Up: At [facility], you and your health needs are our priority.  As part of our continuing mission to provide you with exceptional heart care, we have created designated Provider Care Teams.  These Care Teams include your primary Cardiologist (physician) and Advanced Practice Providers (APPs -  Physician Assistants and Nurse Practitioners) who all work together to provide you with the care you need, when you need it.  Your next appointment:   Next available  Provider:   Nobie Putnam, MD to discuss loop removal and re-insertion

## 2023-06-01 NOTE — Progress Notes (Unsigned)
 Enrolled for [employer] to mail a ZIO XT long term holter monitor to the patients address on file.  It was noted on the order , the patient has a Loop recorder. Dr. Jimmey Ralph to read.

## 2023-06-02 ENCOUNTER — Ambulatory Visit (HOSPITAL_COMMUNITY): Attending: Cardiology

## 2023-06-02 DIAGNOSIS — E785 Hyperlipidemia, unspecified: Secondary | ICD-10-CM | POA: Diagnosis not present

## 2023-06-02 DIAGNOSIS — I129 Hypertensive chronic kidney disease with stage 1 through stage 4 chronic kidney disease, or unspecified chronic kidney disease: Secondary | ICD-10-CM | POA: Diagnosis not present

## 2023-06-02 DIAGNOSIS — E291 Testicular hypofunction: Secondary | ICD-10-CM | POA: Diagnosis not present

## 2023-06-02 DIAGNOSIS — R072 Precordial pain: Secondary | ICD-10-CM | POA: Diagnosis not present

## 2023-06-02 DIAGNOSIS — D649 Anemia, unspecified: Secondary | ICD-10-CM | POA: Diagnosis not present

## 2023-06-02 DIAGNOSIS — E7800 Pure hypercholesterolemia, unspecified: Secondary | ICD-10-CM | POA: Diagnosis not present

## 2023-06-02 DIAGNOSIS — N401 Enlarged prostate with lower urinary tract symptoms: Secondary | ICD-10-CM | POA: Diagnosis not present

## 2023-06-02 DIAGNOSIS — Z1212 Encounter for screening for malignant neoplasm of rectum: Secondary | ICD-10-CM | POA: Diagnosis not present

## 2023-06-02 LAB — MYOCARDIAL PERFUSION IMAGING
Base ST Depression (mm): 0 mm
LV dias vol: 89 mL (ref 62–150)
LV sys vol: 44 mL
Nuc Stress EF: 51 %
Peak HR: 72 {beats}/min
Rest HR: 49 {beats}/min
Rest Nuclear Isotope Dose: 10.8 mCi
SDS: 3
SRS: 0
SSS: 3
ST Depression (mm): 0 mm
Stress Nuclear Isotope Dose: 31.6 mCi
TID: 0.97

## 2023-06-02 MED ORDER — REGADENOSON 0.4 MG/5ML IV SOLN
0.4000 mg | Freq: Once | INTRAVENOUS | Status: AC
  Administered 2023-06-02: 0.4 mg via INTRAVENOUS

## 2023-06-02 MED ORDER — TECHNETIUM TC 99M TETROFOSMIN IV KIT
10.8000 | PACK | Freq: Once | INTRAVENOUS | Status: AC | PRN
  Administered 2023-06-02: 10.8 via INTRAVENOUS

## 2023-06-02 MED ORDER — TECHNETIUM TC 99M TETROFOSMIN IV KIT
31.6000 | PACK | Freq: Once | INTRAVENOUS | Status: AC | PRN
  Administered 2023-06-02: 31.6 via INTRAVENOUS

## 2023-06-04 ENCOUNTER — Encounter (INDEPENDENT_AMBULATORY_CARE_PROVIDER_SITE_OTHER): Payer: PPO | Admitting: Ophthalmology

## 2023-06-04 DIAGNOSIS — H43813 Vitreous degeneration, bilateral: Secondary | ICD-10-CM | POA: Diagnosis not present

## 2023-06-04 DIAGNOSIS — H34832 Tributary (branch) retinal vein occlusion, left eye, with macular edema: Secondary | ICD-10-CM | POA: Diagnosis not present

## 2023-06-04 DIAGNOSIS — H35033 Hypertensive retinopathy, bilateral: Secondary | ICD-10-CM

## 2023-06-04 DIAGNOSIS — I10 Essential (primary) hypertension: Secondary | ICD-10-CM

## 2023-06-09 DIAGNOSIS — D692 Other nonthrombocytopenic purpura: Secondary | ICD-10-CM | POA: Diagnosis not present

## 2023-06-09 DIAGNOSIS — E291 Testicular hypofunction: Secondary | ICD-10-CM | POA: Diagnosis not present

## 2023-06-09 DIAGNOSIS — M5116 Intervertebral disc disorders with radiculopathy, lumbar region: Secondary | ICD-10-CM | POA: Diagnosis not present

## 2023-06-09 DIAGNOSIS — Z9861 Coronary angioplasty status: Secondary | ICD-10-CM | POA: Diagnosis not present

## 2023-06-09 DIAGNOSIS — Z1331 Encounter for screening for depression: Secondary | ICD-10-CM | POA: Diagnosis not present

## 2023-06-09 DIAGNOSIS — E2740 Unspecified adrenocortical insufficiency: Secondary | ICD-10-CM | POA: Diagnosis not present

## 2023-06-09 DIAGNOSIS — Z0000 Encounter for general adult medical examination without abnormal findings: Secondary | ICD-10-CM | POA: Diagnosis not present

## 2023-06-09 DIAGNOSIS — I209 Angina pectoris, unspecified: Secondary | ICD-10-CM | POA: Diagnosis not present

## 2023-06-09 DIAGNOSIS — E7800 Pure hypercholesterolemia, unspecified: Secondary | ICD-10-CM | POA: Diagnosis not present

## 2023-06-09 DIAGNOSIS — N1831 Chronic kidney disease, stage 3a: Secondary | ICD-10-CM | POA: Diagnosis not present

## 2023-06-09 DIAGNOSIS — N401 Enlarged prostate with lower urinary tract symptoms: Secondary | ICD-10-CM | POA: Diagnosis not present

## 2023-06-09 DIAGNOSIS — I272 Pulmonary hypertension, unspecified: Secondary | ICD-10-CM | POA: Diagnosis not present

## 2023-06-09 DIAGNOSIS — E663 Overweight: Secondary | ICD-10-CM | POA: Diagnosis not present

## 2023-06-09 DIAGNOSIS — R82998 Other abnormal findings in urine: Secondary | ICD-10-CM | POA: Diagnosis not present

## 2023-06-09 DIAGNOSIS — E1129 Type 2 diabetes mellitus with other diabetic kidney complication: Secondary | ICD-10-CM | POA: Diagnosis not present

## 2023-06-09 DIAGNOSIS — I129 Hypertensive chronic kidney disease with stage 1 through stage 4 chronic kidney disease, or unspecified chronic kidney disease: Secondary | ICD-10-CM | POA: Diagnosis not present

## 2023-06-09 DIAGNOSIS — Z1339 Encounter for screening examination for other mental health and behavioral disorders: Secondary | ICD-10-CM | POA: Diagnosis not present

## 2023-06-09 DIAGNOSIS — H9190 Unspecified hearing loss, unspecified ear: Secondary | ICD-10-CM | POA: Diagnosis not present

## 2023-06-11 ENCOUNTER — Telehealth: Payer: Self-pay

## 2023-06-11 NOTE — Telephone Encounter (Signed)
 No messages received for at least 21 days No messages received for 21 days (since last activation). The patient will be deactivated in 68 days.

## 2023-06-11 NOTE — Telephone Encounter (Signed)
 Per review of Pt chart, Pt's loop recorder is nonfunctional.  Pt scheduled for extraction/replacement.  Pt deactivated in [employer].

## 2023-06-15 ENCOUNTER — Ambulatory Visit: Payer: PPO | Attending: Cardiology | Admitting: Cardiology

## 2023-06-15 ENCOUNTER — Encounter: Payer: Self-pay | Admitting: Cardiology

## 2023-06-15 VITALS — BP 101/59 | HR 64 | Resp 16 | Ht 67.0 in | Wt 186.2 lb

## 2023-06-15 DIAGNOSIS — I25118 Atherosclerotic heart disease of native coronary artery with other forms of angina pectoris: Secondary | ICD-10-CM | POA: Diagnosis not present

## 2023-06-15 DIAGNOSIS — E782 Mixed hyperlipidemia: Secondary | ICD-10-CM

## 2023-06-15 DIAGNOSIS — I951 Orthostatic hypotension: Secondary | ICD-10-CM | POA: Diagnosis not present

## 2023-06-15 NOTE — Patient Instructions (Signed)
 Your blood pressure medications include  Metoprolol succinate 50 mg daily  Hydralazine 25 mg 3 times daily as needed for high blood pressure >140 mmHg  Midodrine 2.5 mg 3 times daily as needed for low blood pressure for blood pressure <100 mmHg  I am stopping hydrochlorothiazide today.  Medication Instructions:  See instructions above *If you need a refill on your cardiac medications before your next appointment, please call your pharmacy*  Lab Work: none If you have labs (blood work) drawn today and your tests are completely normal, you will receive your results only by: MyChart Message (if you have MyChart) OR A paper copy in the mail If you have any lab test that is abnormal or we need to change your treatment, we will call you to review the results.  Testing/Procedures: none  Follow-Up: At [facility], you and your health needs are our priority.  As part of our continuing mission to provide you with exceptional heart care, our providers are all part of one team.  This team includes your primary Cardiologist (physician) and Advanced Practice Providers or APPs (Physician Assistants and Nurse Practitioners) who all work together to provide you with the care you need, when you need it.  Your next appointment:   July 10 at 10 AM  Provider:   Yates Decamp, MD     We recommend signing up for the patient portal called "MyChart".  Sign up information is provided on this After Visit Summary.  MyChart is used to connect with patients for Virtual Visits (Telemedicine).  Patients are able to view lab/test results, encounter notes, upcoming appointments, etc.  Non-urgent messages can be sent to your provider as well.   To learn more about what you can do with MyChart, go to [email or web address].   Other Instructions Keep scheduled appointment with Dr Jimmey Ralph on May 23      1st Floor: - Lobby - Registration  - Pharmacy  - Lab - Cafe  2nd Floor: - PV Lab - Diagnostic  Testing (echo, CT, nuclear med)  3rd Floor: - Vacant  4th Floor: - TCTS (cardiothoracic surgery) - AFib Clinic - Structural Heart Clinic - Vascular Surgery  - Vascular Ultrasound  5th Floor: - HeartCare Cardiology (general and EP) - Clinical Pharmacy for coumadin, hypertension, lipid, weight-loss medications, and med management appointments    Valet parking services will be available as well.

## 2023-06-15 NOTE — Progress Notes (Signed)
 Cardiology Office Note:  .   Date:  06/15/2023  ID:  [name], DOB 1945/06/01, MRN [id number] PCP: Gaspar Garbe, MD  [facility] Providers Cardiologist:  Yates Decamp, MD   History of Present Illness: .   [name] is a 78 y.o. Caucasian male patient with coronary artery SP CABG in 1996, chronic stable angina, angioplasty to distal LM and ostial and proximal CX on 12/24/2021 and repeat Cutting Balloon angioplasty for restenosis on 11/27/2022, has a patent LIMA to LAD and free radial graft with the ostial stent to D1 and OM-1.  PMH significant for mild aortic stenosis, mixed hyperlipidemia, GERD, chronic back pain, recurrent syncope and dizziness, underwent loop recorder implantation on 04/09/2021.  In the interim patient has developed severe orthostatic hypotension and all his antihypertensive medications were discontinued, now being evaluated for adrenal insufficiency.  Discussed the use of AI scribe software for clinical note transcription with the patient, who gave verbal consent to proceed.  History of Present Illness   The patient, with a history of coronary artery disease and orthostatic hypotension, presents with chest pain and dizziness. The chest pain is described as discomfort that occurs when the patient is walking and lasts for about 15-20 minutes before the patient needs to sit down. The patient has been managing the chest pain with nitroglycerin as needed.   The patient's dizziness is likely due to orthostatic hypotension, which has been managed with midodrine as needed.  States that overall he is presently doing well except for continued angina for which he has to take sublingual nitroglycerin and dyspnea on exertion but it has not been severely lifestyle limiting and has remained stable.    Labs   Lab Results  Component Value Date   CHOL 84 03/16/2016   HDL 32 (L) 03/16/2016   LDLCALC 39 03/16/2016   TRIG 67 03/16/2016   CHOLHDL 2.6 03/16/2016   Lab  Results  Component Value Date   NA 137 03/27/2023   K 3.7 03/27/2023   CO2 22 03/27/2023   GLUCOSE 153 (H) 03/27/2023   BUN 16 03/27/2023   CREATININE 1.15 03/27/2023   CALCIUM 8.7 (L) 03/27/2023   EGFR 51 (L) 02/03/2023   GFRNONAA >60 03/27/2023      Latest Ref Rng & Units 03/27/2023    3:27 AM 03/26/2023    3:12 AM 03/25/2023   12:31 PM  BMP  Glucose 70 - 99 mg/dL 409  811  914   BUN 8 - 23 mg/dL 16  19  28    Creatinine 0.61 - 1.24 mg/dL 7.82  9.56  2.13   Sodium 135 - 145 mmol/L 137  133  132   Potassium 3.5 - 5.1 mmol/L 3.7  3.7  3.8   Chloride 98 - 111 mmol/L 106  106  104   CO2 22 - 32 mmol/L 22  20  20    Calcium 8.9 - 10.3 mg/dL 8.7  8.2  8.1       Latest Ref Rng & Units 03/27/2023    3:27 AM 03/26/2023    3:12 AM 03/25/2023   12:31 PM  CBC  WBC 4.0 - 10.5 K/uL 4.4  3.5  3.8   Hemoglobin 13.0 - 17.0 g/dL 08.6  57.8  46.9   Hematocrit 39.0 - 52.0 % 34.3  31.0  35.7   Platelets 150 - 400 K/uL 140  147  166    No results found for: "HGBA1C"  Lab Results  Component Value Date  TSH 1.905 11/30/2022    External Labs:  PCP [facility] labs 06/02/2023:  Total cholesterol 104, triglycerides 142, HDL 39, LDL 37.  Review of Systems  Cardiovascular:  Positive for chest pain (NTG responsive). Negative for dyspnea on exertion, leg swelling and syncope.  Neurological:  Positive for dizziness.   Physical Exam:   VS:  BP (!) 101/59 (BP Location: Left Arm, Patient Position: Standing, Cuff Size: Normal)   Pulse 64   Resp 16   Ht 5\' 7"  (1.702 m)   Wt 186 lb 3.2 oz (84.5 kg)   SpO2 97%   BMI 29.16 kg/m    Wt Readings from Last 3 Encounters:  06/15/23 186 lb 3.2 oz (84.5 kg)  06/02/23 187 lb (84.8 kg)  06/01/23 187 lb (84.8 kg)    Orthostatic Vitals for the past 48 hrs (Last 6 readings):  Patient Position Orthostatic BP Orthostatic Pulse BP Pulse BP Location Cuff Size  06/15/23 1016 Sitting -- -- 110/66 62 Left Arm Normal  06/15/23 1017 Supine 138/77 58 -- -- Left Arm Normal   06/15/23 1018 Sitting 122/66 59 -- -- Left Arm Normal  06/15/23 1019 Standing -- -- (!) 101/59 64 Left Arm Normal   Physical Exam Neck:     Vascular: No JVD.  Cardiovascular:     Rate and Rhythm: Normal rate and regular rhythm.     Pulses: Intact distal pulses.     Heart sounds: S1 normal and S2 normal. Murmur heard.     Early systolic murmur is present with a grade of 2/6 at the upper right sternal border.     No gallop.  Pulmonary:     Effort: Pulmonary effort is normal.     Breath sounds: Normal breath sounds.  Abdominal:     General: Bowel sounds are normal.     Palpations: Abdomen is soft.  Musculoskeletal:     Right lower leg: No edema.     Left lower leg: No edema.    Studies Reviewed: .    Echocardiogram 11/30/2022: 1. Left ventricular ejection fraction, by estimation, is 60 to 65%. The left ventricle has normal function. The left ventricle has no regional wall motion abnormalities. There is mild concentric left ventricular hypertrophy. Left ventricular diastolic  parameters are consistent with Grade I diastolic dysfunction (impaired relaxation).  2. Right ventricular systolic function is normal. The right ventricular size is normal. There is normal pulmonary artery systolic pressure. The estimated right ventricular systolic pressure is 25.8 mmHg.  3. Left atrial size was mildly dilated.  4. The mitral valve is grossly normal. Trivial mitral valve regurgitation.  5. The aortic valve is functionally bicuspid. There is moderate to severe calcification of the aortic valve. Aortic valve regurgitation is not visualized. Mild aortic valve stenosis. Aortic valve area, by VTI measures 1.49 cm. Aortic valve mean  gradient measures 11.0 mmHg. Aortic valve Vmax measures 2.27 m/s. Dimentionless index 0.43.  6. The inferior vena cava is normal in size with greater than 50% respiratory variability, suggesting right atrial pressure of 3 mmHg.   Left Heart Catheterization 11/25/22:    Has a high-grade 95% in-stent restenosis at the previously placed 2.5 x 24 mm Synergy XD on 12/14/2021.  Successful but complex and difficult PCI with [facility] cutting balloon angioplasty    EKG:    EKG 11/27/2022: Normal sinus rhythm at the rate of 60 bpm, left anterior fascicular block. IVCD, borderline criteria for LVH.   Medications and allergies    Allergies  Allergen Reactions  Codeine Nausea And Vomiting   Guaifenesin & Derivatives Nausea And Vomiting   Terbinafine And Related Other (See Comments)    Muscle pain   Iodinated Contrast Media Itching   Penicillins Rash    Tolerates Cefazolin without problems...[employer]     Current Outpatient Medications:    acetaminophen (TYLENOL) 500 MG tablet, Take 1,000 mg by mouth every 6 (six) hours as needed for moderate pain., Disp: , Rfl:    aspirin 81 MG chewable tablet, Chew 81 mg by mouth every evening., Disp: , Rfl:    Calcium Carbonate Antacid (TUMS ULTRA PO), Take 2 tablets by mouth daily as needed (acid reflux/indigestion.)., Disp: , Rfl:    Coenzyme Q10 (COQ10 PO), Take 300 mg by mouth 2 (two) times daily., Disp: , Rfl:    Cyanocobalamin (VITAMIN B-12) 5000 MCG TBDP, Take 5,000 mcg by mouth every evening., Disp: , Rfl:    ezetimibe (ZETIA) 10 MG tablet, TAKE 1/2 TABLET BY MOUTH EVERY EVENING, Disp: 45 tablet, Rfl: 11   ferrous sulfate 325 (65 FE) MG tablet, Take 1 tablet (325 mg total) by mouth daily with breakfast., Disp: 30 tablet, Rfl: 3   hydrALAZINE (APRESOLINE) 25 MG tablet, Take 25 mg by mouth 3 (three) times daily as needed (hypertension)., Disp: , Rfl:    hydrocortisone (CORTEF) 10 MG tablet, Take 1 tablet (10 mg total) by mouth in the morning. (Patient taking differently: Take 10 mg by mouth in the morning. Take in addition to 5mg  in the afternoon for a total daily dose of 15mg ), Disp: 30 tablet, Rfl: 1   hydrocortisone (CORTEF) 5 MG tablet, Take 1 tablet (5 mg total) by mouth daily in the afternoon. (Patient taking  differently: Take 5 mg by mouth daily in the afternoon. Take in addition to 10mg  in the morning for a total daily dose of 15mg ), Disp: 30 tablet, Rfl: 1   ipratropium-albuterol (DUONEB) 0.5-2.5 (3) MG/3ML SOLN, Take 3 mLs by nebulization every 6 (six) hours as needed (wheezing or shortness of breath)., Disp: 75 mL, Rfl: 0   metoprolol succinate (TOPROL-XL) 50 MG 24 hr tablet, Take 1 tablet (50 mg total) by mouth daily. Take with or immediately following a meal., Disp: 90 tablet, Rfl: 3   midodrine (PROAMATINE) 2.5 MG tablet, Take 1 tablet (2.5 mg total) by mouth 3 (three) times daily with meals as needed (SBP less than 100)., Disp: 30 tablet, Rfl: 0   Multiple Vitamin (MULTIVITAMIN) tablet, Take 1 tablet by mouth in the morning. Seniors, Disp: , Rfl:    Multiple Vitamins-Minerals (ICAPS AREDS 2 PO), Take 1 capsule by mouth daily., Disp: , Rfl:    nitroGLYCERIN (NITROSTAT) 0.4 MG SL tablet, DISSOLVE 1 TABLET UNDER THE TONGUE AS NEEDED FOR CHEST PAIN EVERY 5 MINUTES UP TO 3 TIMES. IF NO RELIEF CALL 911., Disp: 25 tablet, Rfl: 9   OMEGA-3 FATTY ACIDS PO, Take 1,200 mg by mouth in the morning and at bedtime., Disp: , Rfl:    pantoprazole (PROTONIX) 40 MG tablet, Take 1 tablet (40 mg total) by mouth daily., Disp: 30 tablet, Rfl: 1   Polyethyl Glycol-Propyl Glycol (SYSTANE) 0.4-0.3 % GEL ophthalmic gel, Place 1 application into both eyes at bedtime., Disp: , Rfl:    Propylene Glycol (SYSTANE BALANCE) 0.6 % SOLN, Place 1 drop into both eyes 3 (three) times daily as needed (dry/irritated eyes.)., Disp: , Rfl:    pyridoxine (B-6) 100 MG tablet, Take 100 mg by mouth every evening., Disp: , Rfl:    ranolazine (RANEXA)  1000 MG SR tablet, Take 1 tablet (1,000 mg total) by mouth daily., Disp: 90 tablet, Rfl: 3   rosuvastatin (CRESTOR) 20 MG tablet, TAKE 1 TABLET BY MOUTH EVERY DAY AT BEDTIME, Disp: 30 tablet, Rfl: 11   sennosides-docusate sodium (SENOKOT-S) 8.6-50 MG tablet, Take 1 tablet by mouth daily as needed  for constipation., Disp: , Rfl:    trimethoprim-polymyxin b (POLYTRIM) ophthalmic solution, Place 1 drop into the left eye See admin instructions. Instill 1 drop in left eye four times daily for 2 days after monthly eye injection, Disp: , Rfl:    vitamin C (ASCORBIC ACID) 500 MG tablet, Take 500 mg by mouth daily., Disp: , Rfl:    No orders of the defined types were placed in this encounter.    Medications Discontinued During This Encounter  Medication Reason   losartan (COZAAR) 25 MG tablet Discontinued by provider   triamterene-hydrochlorothiazide (DYAZIDE) 37.5-25 MG capsule Change in therapy   loperamide (IMODIUM) 2 MG capsule Completed Course   fluticasone (FLONASE) 50 MCG/ACT nasal spray Completed Course   PRESCRIPTION MEDICATION Completed Course   hydrochlorothiazide (MICROZIDE) 12.5 MG capsule Discontinued by provider   losartan (COZAAR) 25 MG tablet Discontinued by provider     ASSESSMENT AND PLAN: .      ICD-10-CM   1. Coronary artery disease of native artery of native heart with stable angina pectoris (HCC)  I25.118     2. Orthostatic hypotension  I95.1     3. Mixed hyperlipidemia  E78.2     Discontinued meds Hydrochlorothiazide 12.5 mg every other day  Assessment and Plan    Coronary Artery Disease with Re-stenosis suspected and ostial CX Coronary artery disease with suspected re-stenosis at the ostium of the circumflex coronary artery is causing intermittent chest pain. He uses nitroglycerin occasionally for relief. Intervention with a medication-coated balloon or further catheterization will depend on symptom severity and lifestyle impact.  Catheterization will be considered if symptoms significantly impact lifestyle. A medication-coated balloon is an alternative to stenting to prevent re-stenosis. Monitor symptoms and report if he worsens or becomes more frequent.  I will see him back in 3 months for follow-up of his symptoms as in summer months he tends to be more  active.  Orthostatic Hypotension   Dizziness due to orthostatic hypotension is exacerbated by hypotension, expected to worsen with summer heat. Hydrochlorothiazide, previously taken every other day, has been discontinued to manage blood pressure more effectively. He is advised to consume electrolyte solutions like Pedialyte instead of water. Gatorade should be avoided due to bloating, and homemade electrolyte solutions are recommended.  Dyslipidemia   Cholesterol levels are well-controlled with Zetia and Crestor. Continue Zetia and Crestor as prescribed.  Loop Recorder Management   A loop recorder was implanted due to syncope related to hypotension. The cause has been identified as orthostatic hypotension and managed, making the loop recorder unnecessary. He has opted for removal without reimplantation. Proceed with removal of the loop recorder.   Signed,  Yates Decamp, MD, [facility] 06/15/2023, 11:08 AM [facility] [address] [place], [place] [address] Phone: [phone]. Fax:  [phone]

## 2023-06-17 DIAGNOSIS — Z9861 Coronary angioplasty status: Secondary | ICD-10-CM | POA: Diagnosis not present

## 2023-06-17 DIAGNOSIS — I951 Orthostatic hypotension: Secondary | ICD-10-CM | POA: Diagnosis not present

## 2023-06-17 DIAGNOSIS — E2740 Unspecified adrenocortical insufficiency: Secondary | ICD-10-CM | POA: Diagnosis not present

## 2023-06-17 DIAGNOSIS — E871 Hypo-osmolality and hyponatremia: Secondary | ICD-10-CM | POA: Diagnosis not present

## 2023-06-17 DIAGNOSIS — E1129 Type 2 diabetes mellitus with other diabetic kidney complication: Secondary | ICD-10-CM | POA: Diagnosis not present

## 2023-06-22 DIAGNOSIS — I25118 Atherosclerotic heart disease of native coronary artery with other forms of angina pectoris: Secondary | ICD-10-CM | POA: Diagnosis not present

## 2023-06-22 DIAGNOSIS — R072 Precordial pain: Secondary | ICD-10-CM | POA: Diagnosis not present

## 2023-06-22 DIAGNOSIS — R55 Syncope and collapse: Secondary | ICD-10-CM

## 2023-07-02 ENCOUNTER — Encounter (INDEPENDENT_AMBULATORY_CARE_PROVIDER_SITE_OTHER): Admitting: Ophthalmology

## 2023-07-04 DIAGNOSIS — M25572 Pain in left ankle and joints of left foot: Secondary | ICD-10-CM | POA: Diagnosis not present

## 2023-07-09 ENCOUNTER — Encounter (INDEPENDENT_AMBULATORY_CARE_PROVIDER_SITE_OTHER): Admitting: Ophthalmology

## 2023-07-09 DIAGNOSIS — H43813 Vitreous degeneration, bilateral: Secondary | ICD-10-CM | POA: Diagnosis not present

## 2023-07-09 DIAGNOSIS — H34832 Tributary (branch) retinal vein occlusion, left eye, with macular edema: Secondary | ICD-10-CM | POA: Diagnosis not present

## 2023-07-09 DIAGNOSIS — H35033 Hypertensive retinopathy, bilateral: Secondary | ICD-10-CM | POA: Diagnosis not present

## 2023-07-09 DIAGNOSIS — I10 Essential (primary) hypertension: Secondary | ICD-10-CM | POA: Diagnosis not present

## 2023-07-15 DIAGNOSIS — S2231XA Fracture of one rib, right side, initial encounter for closed fracture: Secondary | ICD-10-CM | POA: Diagnosis not present

## 2023-07-31 ENCOUNTER — Encounter: Payer: Self-pay | Admitting: Cardiology

## 2023-07-31 ENCOUNTER — Ambulatory Visit: Attending: Cardiology | Admitting: Cardiology

## 2023-07-31 VITALS — BP 128/60 | HR 50 | Ht 67.0 in | Wt 186.0 lb

## 2023-07-31 DIAGNOSIS — Z9889 Other specified postprocedural states: Secondary | ICD-10-CM | POA: Diagnosis not present

## 2023-07-31 DIAGNOSIS — R55 Syncope and collapse: Secondary | ICD-10-CM

## 2023-07-31 DIAGNOSIS — I951 Orthostatic hypotension: Secondary | ICD-10-CM

## 2023-07-31 NOTE — Progress Notes (Signed)
 Electrophysiology Office Note:   Date:  07/31/2023  ID:  [name], DOB Aug 19, 1945, MRN [id number]  Primary Cardiologist: Knox Perl, MD Electrophysiologist: None      History of Present Illness:   [name] is a 78 y.o. male with h/o coronary artery s/p CABG in 1996, chronic stable angina, angioplasty to distal LM and ostial and proximal CX on 12/24/2021 and repeat Cutting Balloon angioplasty for restenosis on 11/27/2022, has a patent LIMA to LAD and free radial graft with the ostial stent to D1 and OM-1, mild aortic stenosis, mixed hyperlipidemia, GERD, chronic back pain, recurrent syncope and dizziness s/p loop recorder who is being seen today for loop recorder removal.   Discussed the use of AI scribe software for clinical note transcription with the patient, who gave verbal consent to proceed.  History of Present Illness He had a ILR placed due to episodes of syncope, dizziness, and lightheadedness.  He reports that the monitor is not functioning effectively and has become sore and prominent under the skin. He describes the sensation as 'sore' and notes that it has 'migrated its way back' under the skin, causing discomfort. His syncope has been worked up and attributed to orthostasis. He presents today for device removal.   Review of systems complete and found to be negative unless listed in HPI.   EP Information / Studies Reviewed:    EKG is ordered today. Personal review as below.  EKG Interpretation Date/Time:  Friday Jul 31 2023 16:02:09 EDT Ventricular Rate:  50 PR Interval:  162 QRS Duration:  126 QT Interval:  438 QTC Calculation: 399 R Axis:   -43  Text Interpretation: Sinus bradycardia Left axis deviation Left ventricular hypertrophy with QRS widening ( R in aVL , Cornell product ) When compared with ECG of 24-Mar-2023 15:48,rate slower. Confirmed by Ardeen Kohler [phone]) on 07/31/2023 8:42:09 PM   Zio Monitor 06/2023:  Patch Wear Time:  10 days and 10 hours  (2025-03-29T22:12:23-0400 to 2025-04-09T09:03:15-0400)   HR 43 - 158, average 56 bpm. 5 nonsustained SVT (longest 12 beats) No atrial fibrillation detected. Rare supraventricular ectopy. Rare ventricular ectopy. No sustained arrhythmias. Symptom trigger episodes correspond to sinus rhythm.  Nuclear Stress 05/2023:    The study is normal. The study is low risk.   No ST deviation was noted.   LV perfusion is normal. There is no evidence of ischemia. There is no evidence of infarction.   Left ventricular function is normal. Nuclear stress EF: 51%. The left ventricular ejection fraction is mildly decreased (45-54%). End diastolic cavity size is normal. End systolic cavity size is normal.   Physical Exam:   VS:  BP 128/60   Pulse (!) 50   Ht 5\' 7"  (1.702 m)   Wt 186 lb (84.4 kg)   SpO2 97%   BMI 29.13 kg/m    Wt Readings from Last 3 Encounters:  07/31/23 186 lb (84.4 kg)  06/15/23 186 lb 3.2 oz (84.5 kg)  06/02/23 187 lb (84.8 kg)     GEN: Well nourished, well developed in no acute distress NECK: No JVD CARDIAC: Bradycardic, regular rhythm RESPIRATORY:  Clear to auscultation without rales, wheezing or rhonchi  ABDOMEN: Soft, non-distended EXTREMITIES:  No edema; No deformity   ASSESSMENT AND PLAN:    #. S/p loop recorder: ILR no longer working. Also superficial and causing some discomfort. Syncope has been attributed to orthostasis. Presents today for removal. #. Orthostatic syncope - Discussed risks and benefits of loop recorder removal, including  but not limited to bleeding, infection, damage to nearby structures.  Pt verbalized understanding and agrees to proceed.  SURGEON:  Ardeen Kohler, MD     PREPROCEDURE DIAGNOSIS:  Loop recorder malfunction    POSTPROCEDURE DIAGNOSIS: Loop recorder malfunction     PROCEDURES:   1. Implantable loop recorder explant    INTRODUCTION:  [name] presents with a history of syncope and ILR which no longer functions.     DESCRIPTION OF PROCEDURE:  Informed written consent was obtained.   Time Out Completed with RN   The patients left chest was prepped and draped in the usual sterile fashion. The skin overlying the loop recorder was infiltrated with lidocaine  for local analgesia.  A 0.5-cm incision was made over the end of the loop recorder.  A combination of sharp and blunt dissection was used to free the end of the loop recorder, which was secured with hemostats and successfully extracted. Steri- Strips and a sterile dressing were then applied.  There were no early apparent complications.     CONCLUSIONS:   1. Successful explant of a implantable loop recorder.  2. No early apparent complications.   Signed, Ardeen Kohler, MD

## 2023-07-31 NOTE — Patient Instructions (Signed)
 Medication Instructions:  Your physician recommends that you continue on your current medications as directed. Please refer to the Current Medication list given to you today.  Labwork: None ordered.  Testing/Procedures: None ordered.  Follow-Up:  As needed with Dr. Jimmey Ralph   Implantable Loop Recorder Removal, Care After This sheet gives you information about how to care for yourself after your procedure. Your health care provider may also give you more specific instructions. If you have problems or questions, contact your health care provider. What can I expect after the procedure? After the procedure, it is common to have: Soreness or discomfort near the incision. Some swelling or bruising near the incision.  Follow these instructions at home: Incision care  Monitor your cardiac device site for redness, swelling, and drainage. Call the device clinic at [phone] if you experience these symptoms or fever/chills.  Keep the large square bandage on your site for 24 hours and then you may remove it yourself. Keep the steri-strips underneath in place.   You may shower after 72 hours / 3 days from your procedure with the steri-strips in place. They will usually fall off on their own, or may be removed after 10 days. Pat dry.   Avoid lotions, ointments, or perfumes over your incision until it is well-healed.  Please do not submerge in water until your site is completely healed.   If your wound site starts to bleed apply pressure.       If you have any questions/concerns please call the device clinic at [phone].  Activity  Return to your normal activities.  Contact a health care provider if: You have redness, swelling, or pain around your incision. You have a fever.

## 2023-08-06 ENCOUNTER — Encounter (INDEPENDENT_AMBULATORY_CARE_PROVIDER_SITE_OTHER): Admitting: Ophthalmology

## 2023-08-06 DIAGNOSIS — H34832 Tributary (branch) retinal vein occlusion, left eye, with macular edema: Secondary | ICD-10-CM | POA: Diagnosis not present

## 2023-08-06 DIAGNOSIS — H35033 Hypertensive retinopathy, bilateral: Secondary | ICD-10-CM

## 2023-08-06 DIAGNOSIS — H43813 Vitreous degeneration, bilateral: Secondary | ICD-10-CM

## 2023-08-06 DIAGNOSIS — I10 Essential (primary) hypertension: Secondary | ICD-10-CM | POA: Diagnosis not present

## 2023-09-03 ENCOUNTER — Encounter (INDEPENDENT_AMBULATORY_CARE_PROVIDER_SITE_OTHER): Admitting: Ophthalmology

## 2023-09-03 DIAGNOSIS — H34832 Tributary (branch) retinal vein occlusion, left eye, with macular edema: Secondary | ICD-10-CM | POA: Diagnosis not present

## 2023-09-03 DIAGNOSIS — I10 Essential (primary) hypertension: Secondary | ICD-10-CM | POA: Diagnosis not present

## 2023-09-03 DIAGNOSIS — H35033 Hypertensive retinopathy, bilateral: Secondary | ICD-10-CM

## 2023-09-03 DIAGNOSIS — H43813 Vitreous degeneration, bilateral: Secondary | ICD-10-CM | POA: Diagnosis not present

## 2023-09-08 DIAGNOSIS — L570 Actinic keratosis: Secondary | ICD-10-CM | POA: Diagnosis not present

## 2023-09-08 DIAGNOSIS — Z85828 Personal history of other malignant neoplasm of skin: Secondary | ICD-10-CM | POA: Diagnosis not present

## 2023-09-16 DIAGNOSIS — M25552 Pain in left hip: Secondary | ICD-10-CM | POA: Diagnosis not present

## 2023-09-16 DIAGNOSIS — M48061 Spinal stenosis, lumbar region without neurogenic claudication: Secondary | ICD-10-CM | POA: Diagnosis not present

## 2023-09-17 ENCOUNTER — Encounter: Payer: Self-pay | Admitting: Cardiology

## 2023-09-17 ENCOUNTER — Ambulatory Visit: Attending: Cardiology | Admitting: Cardiology

## 2023-09-17 VITALS — BP 132/58 | HR 58 | Ht 67.0 in | Wt 185.0 lb

## 2023-09-17 DIAGNOSIS — I951 Orthostatic hypotension: Secondary | ICD-10-CM

## 2023-09-17 DIAGNOSIS — I25118 Atherosclerotic heart disease of native coronary artery with other forms of angina pectoris: Secondary | ICD-10-CM

## 2023-09-17 DIAGNOSIS — E782 Mixed hyperlipidemia: Secondary | ICD-10-CM

## 2023-09-17 DIAGNOSIS — I10 Essential (primary) hypertension: Secondary | ICD-10-CM | POA: Diagnosis not present

## 2023-09-17 MED ORDER — HYDRALAZINE HCL 25 MG PO TABS: 25.0000 mg | ORAL_TABLET | Freq: Three times a day (TID) | ORAL | 1 refills | Status: DC | PRN

## 2023-09-17 NOTE — Progress Notes (Signed)
 Cardiology Office Note:  .   Date:  09/17/2023  ID:  [name], DOB Dec 04, 1945, MRN [id number] PCP: Vernadine Charlie ORN, MD  [facility] Providers Cardiologist:  Gordy Bergamo, MD   History of Present Illness: .   [name] is a 78 y.o. Caucasian male patient with coronary artery SP CABG in 1996, chronic stable angina, angioplasty to distal LM and ostial and proximal CX on 12/24/2021 and repeat Cutting Balloon angioplasty for restenosis on 11/27/2022, has a patent LIMA to LAD and free radial graft with the ostial stent to D1 and OM-1.  PMH significant for mild aortic stenosis, mixed hyperlipidemia, GERD, chronic back pain, recurrent syncope and dizziness, underwent loop recorder implantation on 04/09/2021 and explanted on 07/31/2023 as his syncopal episodes were felt to be due to orthostasis.  In the interim patient has developed severe orthostatic hypotension and all his antihypertensive medications were discontinued.  Adrenal insufficiency workup was negative.  Patient states that overall he is feeling well and he has not used any sublingual nitroglycerin , has not had any significant chest discomfort as well.  Discussed the use of AI scribe software for clinical note transcription with the patient, who gave verbal consent to proceed.  History of Present Illness [name] is a 78 year old male with hypertension and coronary artery disease who presents for cardiovascular follow-up.  He experiences occasional dizziness upon standing. His blood pressure fluctuates, with recent readings being relatively low. He takes hydralazine  as needed when his blood pressure exceeds 145 mmHg.  He experiences occasional chest pain described as 'a little pain' and 'stripping'. He is on metoprolol  50 mg once daily and ranolazine  1000 mg twice daily for angina management, which has improved his chest pain.  Labs   Lipoprotein (a)  Date/Time Value Ref Range Status  12/25/2021 02:14 AM 216.0  (H) <75.0 nmol/L Final    Comment:    (NOTE) **Results verified by repeat testing** Note:  Values greater than or equal to 75.0 nmol/L may       indicate an independent risk factor for CHD,       but must be evaluated with caution when applied       to non-Caucasian populations due to the       influence of genetic factors on Lp(a) across       ethnicities. Performed At: [facility] [address] [place], [place] [id number] Jennette Shorter MD [phone]     Lab Results  Component Value Date   NA 137 03/27/2023   K 3.7 03/27/2023   CO2 22 03/27/2023   GLUCOSE 153 (H) 03/27/2023   BUN 16 03/27/2023   CREATININE 1.15 03/27/2023   CALCIUM  8.7 (L) 03/27/2023   EGFR 51 (L) 02/03/2023   GFRNONAA >60 03/27/2023      Latest Ref Rng & Units 03/27/2023    3:27 AM 03/26/2023    3:12 AM 03/25/2023   12:31 PM  BMP  Glucose 70 - 99 mg/dL 846  882  872   BUN 8 - 23 mg/dL 16  19  28    Creatinine 0.61 - 1.24 mg/dL 8.84  9.03  8.72   Sodium 135 - 145 mmol/L 137  133  132   Potassium 3.5 - 5.1 mmol/L 3.7  3.7  3.8   Chloride 98 - 111 mmol/L 106  106  104   CO2 22 - 32 mmol/L 22  20  20    Calcium  8.9 - 10.3 mg/dL 8.7  8.2  8.1       Latest Ref Rng & Units 03/27/2023    3:27 AM 03/26/2023    3:12 AM 03/25/2023   12:31 PM  CBC  WBC 4.0 - 10.5 K/uL 4.4  3.5  3.8   Hemoglobin 13.0 - 17.0 g/dL 88.3  89.6  88.1   Hematocrit 39.0 - 52.0 % 34.3  31.0  35.7   Platelets 150 - 400 K/uL 140  147  166    No results found for: HGBA1C  Lab Results  Component Value Date   TSH 1.905 11/30/2022    PCP labs on Care Everywhere  Lipid profile 05/08/2021:  Total cholesterol 136, triglycerides 152, HDL 41, LDL 69.  ROS  Review of Systems  Cardiovascular:  Negative for chest pain, dyspnea on exertion and leg swelling.   Physical Exam:   VS:  BP (!) 132/58 (BP Location: Left Arm, Patient Position: Sitting, Cuff Size: Normal)   Pulse (!) 58   Ht 5' 7 (1.702 m)   Wt 185 lb (83.9 kg)    SpO2 95%   BMI 28.98 kg/m    Wt Readings from Last 3 Encounters:  09/17/23 185 lb (83.9 kg)  07/31/23 186 lb (84.4 kg)  06/15/23 186 lb 3.2 oz (84.5 kg)    Physical Exam Neck:     Vascular: No JVD.  Cardiovascular:     Rate and Rhythm: Normal rate and regular rhythm.     Pulses: Intact distal pulses.     Heart sounds: S1 normal and S2 normal. Murmur heard.     Early systolic murmur is present with a grade of 2/6 at the upper right sternal border.     No gallop.  Pulmonary:     Effort: Pulmonary effort is normal.     Breath sounds: Normal breath sounds.  Abdominal:     General: Bowel sounds are normal.     Palpations: Abdomen is soft.  Musculoskeletal:     Right lower leg: No edema.     Left lower leg: No edema.    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  Successful but complex and difficult PCI with [facility] cutting balloon angioplasty    EKG:         Medications ordered    Meds ordered this encounter  Medications   hydrALAZINE  (APRESOLINE ) 25 MG tablet    Sig: Take 1 tablet (25 mg total) by mouth 3 (three) times daily as needed (hypertension).    Dispense:  90 tablet    Refill:  1     ASSESSMENT AND PLAN: .      ICD-10-CM   1. Coronary artery disease of native artery of native heart with stable angina pectoris (HCC)  I25.118 Lipid Profile    2. Mixed hyperlipidemia  E78.2 Lipid Profile    3. Essential hypertension  I10 hydrALAZINE  (APRESOLINE ) 25 MG tablet    4. Orthostatic hypotension  I95.1       Assessment and Plan Assessment & Plan Angina pectoris Intermittent chest pain, currently well-managed with metoprolol  and ranolazine . Stress-induced pain has improved. Preference to avoid heart catheterization and drug-coated balloon intervention unless symptoms worsen. - Continue metoprolol  50 mg once a day - Continue ranolazine   1000 mg twice a day - Consider heart catheterization if symptoms worsen with the option of using DCB for recurrent in-stent restenosis if found.  Hypertension Blood pressure fluctuations with recent stability issues. Managed with metoprolol  and hydralazine  as needed to prevent complications. - Continue metoprolol  50 mg once a day - Use hydralazine  50 mg as needed when blood pressure exceeds 145 mmHg  Orthostatic hypotension Dizziness upon standing, managed effectively with support stockings. - Continue wearing support stockings - Balance between management of hypertension and orthostatic hypotension.  Hyperlipidemia Cholesterol levels not recently checked but previously well-controlled on ezetimibe  and rosuvastatin . Monitoring emphasized. - Order cholesterol panel today - Continue ezetimibe  10 mg once a day - Continue rosuvastatin  20 mg once a day Office visit in a year or sooner if problems.   Signed,  Gordy Bergamo, MD, [facility] 09/17/2023, 6:27 PM [facility] [address] [place], [place] [address] Phone: [phone]. Fax:  [phone]

## 2023-09-17 NOTE — Patient Instructions (Addendum)
 Medication Instructions:  Your physician recommends that you continue on your current medications as directed. Please refer to the Current Medication list given to you today.  *If you need a refill on your cardiac medications before your next appointment, please call your pharmacy*  Lab Work: Have lab work drawn at the lab on the first floor today--Lipids If you have labs (blood work) drawn today and your tests are completely normal, you will receive your results only by: MyChart Message (if you have MyChart) OR A paper copy in the mail If you have any lab test that is abnormal or we need to change your treatment, we will call you to review the results.  Testing/Procedures: none  Follow-Up: At [facility], you and your health needs are our priority.  As part of our continuing mission to provide you with exceptional heart care, our providers are all part of one team.  This team includes your primary Cardiologist (physician) and Advanced Practice Providers or APPs (Physician Assistants and Nurse Practitioners) who all work together to provide you with the care you need, when you need it.  Your next appointment:   12 month(s)  Provider:   Gordy Bergamo, MD    We recommend signing up for the patient portal called MyChart.  Sign up information is provided on this After Visit Summary.  MyChart is used to connect with patients for Virtual Visits (Telemedicine).  Patients are able to view lab/test results, encounter notes, upcoming appointments, etc.  Non-urgent messages can be sent to your provider as well.   To learn more about what you can do with MyChart, go to [email or web address].   Other Instructions

## 2023-09-19 ENCOUNTER — Ambulatory Visit
Admission: EM | Admit: 2023-09-19 | Discharge: 2023-09-19 | Disposition: A | Discharge status: Home / Self Care | Attending: Physician Assistant | Admitting: Physician Assistant

## 2023-09-19 ENCOUNTER — Encounter: Payer: Self-pay | Admitting: Emergency Medicine

## 2023-09-19 DIAGNOSIS — J209 Acute bronchitis, unspecified: Secondary | ICD-10-CM | POA: Diagnosis not present

## 2023-09-19 MED ORDER — AZITHROMYCIN 250 MG PO TABS: 250.0000 mg | ORAL_TABLET | Freq: Every day | ORAL | 0 refills | Status: AC

## 2023-09-19 MED ORDER — BENZONATATE 100 MG PO CAPS: 100.0000 mg | ORAL_CAPSULE | Freq: Three times a day (TID) | ORAL | 0 refills | Status: DC

## 2023-09-19 NOTE — ED Triage Notes (Signed)
 Pt presents c/o severe productive cough and chest congestion for 3 weeks. Pt denies any additional sxs. Pt says this cough wakes him up at night and is hard to control once it starts.

## 2023-09-19 NOTE — ED Provider Notes (Signed)
 [facility]    CSN: [id number] Arrival date & time: 09/19/23  1053      History   Chief Complaint Chief Complaint  Patient presents with   Cough   Chest Congestion     HPI [name] is a 78 y.o. male.   Patient presents with 3 weeks of cough and congestion.  He reports fever about 1 week ago but this has resolved.  Reports cough is keeping him up at night.  He reports some wheezing and used an albuterol  nebulizer that he was given here in the past with relief.  Today he is experiencing no wheezing or shortness of breath.  He is taking Coricidin with minimal relief.  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   Noted on EKG   Left axis deviation 05/2018   Noted on EKG   Lipoma 04/20/2018   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   Loop Biotronik Biomonitor III for syncope and collapse 04/09/2021 04/09/2021   LVH (left ventricular hypertrophy) 05/2018   Noted on EKG   Phimosis    Pneumonia    Syncope and collapse    Tuberculosis 1956   Wears glasses    Wheezing     Patient Active Problem List   Diagnosis Date Noted   Diarrhea 03/26/2023   Hypomagnesemia 03/25/2023   Adrenal insufficiency (HCC) 03/25/2023   Syncope 11/29/2022   Pre-syncope 11/28/2022   Elevated troponin 11/28/2022   CAD S/P percutaneous coronary angioplasty 11/28/2022   Coronary artery  disease of bypass graft of native heart with stable angina pectoris (HCC) 11/25/2022   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   Status post coronary artery stent placement    Post PTCA 12/24/2021   Hx of CABG    Syncope and collapse 04/09/2021   Loop Biotronik Biomonitor III for syncope and collapse 04/09/2021 04/09/2021   S/P lumbar laminectomy 07/13/2019   Chest pain 03/16/2016   Constipation 03/16/2016   HTN (hypertension)    Hyperlipidemia    Coronary artery disease of native artery of native heart with stable angina pectoris (HCC)    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    Past Surgical History:  Procedure Laterality Date   ANGIOPLASTY  2010   3 stents placed   BACK SURGERY     CARDIAC CATHETERIZATION     CATARACT EXTRACTION W/ INTRAOCULAR LENS  IMPLANT, BILATERAL  2012   bilateral   CIRCUMCISION N/A 08/30/2018   Procedure: CIRCUMCISION ADULT;  Surgeon: Ottelin, Mark, MD;  Location: [facility];  Service: Urology;  Laterality: N/A;   COLONOSCOPY  2013, 2008   CORONARY ANGIOPLASTY     CORONARY ARTERY BYPASS GRAFT  2006   x4   CORONARY ATHERECTOMY N/A 12/24/2021  Procedure: CORONARY ATHERECTOMY;  Surgeon: Ladona Heinz, MD;  Location: [facility] INVASIVE CV LAB;  Service: Cardiovascular;  Laterality: N/A;   CORONARY BALLOON ANGIOPLASTY N/A 11/25/2022   Procedure: CORONARY BALLOON ANGIOPLASTY;  Surgeon: Ladona Heinz, MD;  Location: [facility] INVASIVE CV LAB;  Service: Cardiovascular;  Laterality: N/A;   CORONARY STENT INTERVENTION N/A 12/24/2021   Procedure: CORONARY STENT INTERVENTION;  Surgeon: Ladona Heinz, MD;  Location: [facility] INVASIVE CV LAB;  Service: Cardiovascular;  Laterality: N/A;   ESOPHAGOGASTRODUODENOSCOPY  10/2011   EYE SURGERY     KNEE ARTHROSCOPY  2001   LEFT HEART CATH AND CORS/GRAFTS ANGIOGRAPHY N/A 12/24/2021   Procedure: LEFT HEART CATH AND CORS/GRAFTS ANGIOGRAPHY;  Surgeon: Ladona Heinz, MD;  Location: [facility] INVASIVE CV LAB;  Service:  Cardiovascular;  Laterality: N/A;   LEFT HEART CATH AND CORS/GRAFTS ANGIOGRAPHY N/A 11/25/2022   Procedure: LEFT HEART CATH AND CORS/GRAFTS ANGIOGRAPHY;  Surgeon: Ladona Heinz, MD;  Location: [facility] INVASIVE CV LAB;  Service: Cardiovascular;  Laterality: N/A;   LUMBAR DISC SURGERY  1998   LUMBAR LAMINECTOMY/DECOMPRESSION MICRODISCECTOMY N/A 07/13/2019   Procedure: Laminectomy and Foraminotomy - Lumbar Two-Lumbar Three - Lumbar Three-Lumbar Four;  Surgeon: Joshua Alm RAMAN, MD;  Location: [facility] OR;  Service: Neurosurgery;  Laterality: N/A;  Laminectomy and Foraminotomy - Lumbar Two-Lumbar Three - Lumbar Three-Lumbar Four   ROTATOR CUFF REPAIR Left 05/24/2020   TENDON RECONSTRUCTION Right 10/27/2013   Procedure: RIGHT OPEN TRICEPS REPAIR;  Surgeon: Evalene JONETTA Chancy, MD;  Location: [facility];  Service: Orthopedics;  Laterality: Right;   TOTAL HIP ARTHROPLASTY Left 08/05/2022   Procedure: TOTAL HIP ARTHROPLASTY;  Surgeon: Josefina Chew, MD;  Location: [facility];  Service: Orthopedics;  Laterality: Left;       Home Medications    Prior to Admission medications   Medication Sig Start Date End Date Taking? Authorizing Provider  azithromycin  (ZITHROMAX  Z-PAK) 250 MG tablet Take 1 tablet (250 mg total) by mouth daily for 5 days. 09/19/23 09/24/23 Yes Ward, Harlene PEDLAR, PA-C  benzonatate  (TESSALON ) 100 MG capsule Take 1 capsule (100 mg total) by mouth every 8 (eight) hours. 09/19/23  Yes Ward, Harlene PEDLAR, PA-C  acetaminophen  (TYLENOL ) 500 MG tablet Take 1,000 mg by mouth every 6 (six) hours as needed for moderate pain.    [provider]  aspirin  81 MG chewable tablet Chew 81 mg by mouth every evening.    [provider]  Calcium  Carbonate Antacid (TUMS ULTRA PO) Take 2 tablets by mouth daily as needed (acid reflux/indigestion.).    [provider]  Coenzyme Q10 (COQ10 PO) Take 300 mg by mouth 2 (two) times daily.    [provider]  Cyanocobalamin  (VITAMIN B-12) 5000  MCG TBDP Take 5,000 mcg by mouth every evening. 12/07/20   [provider]  ezetimibe  (ZETIA ) 10 MG tablet TAKE 1/2 TABLET BY MOUTH EVERY EVENING Patient taking differently: Take 5 mg by mouth daily. 08/21/21   Ladona Heinz, MD  ferrous sulfate  325 (65 FE) MG tablet Take 1 tablet (325 mg total) by mouth daily with breakfast. 12/03/22   Regalado, Belkys A, MD  hydrALAZINE  (APRESOLINE ) 25 MG tablet Take 1 tablet (25 mg total) by mouth 3 (three) times daily as needed (hypertension). 09/17/23   Ladona Heinz, MD  hydrocortisone  (CORTEF ) 10 MG tablet Take 1 tablet (10 mg total) by mouth in the morning. 12/02/22   Regalado, Belkys A, MD  hydrocortisone  (CORTEF ) 5 MG tablet Take 1 tablet (5 mg total) by mouth daily in the  afternoon. 12/02/22   Regalado, Belkys A, MD  ipratropium-albuterol  (DUONEB) 0.5-2.5 (3) MG/3ML SOLN Take 3 mLs by nebulization every 6 (six) hours as needed (wheezing or shortness of breath). 02/16/23   Arloa Suzen RAMAN, NP  metoprolol  succinate (TOPROL -XL) 50 MG 24 hr tablet Take 1 tablet (50 mg total) by mouth daily. Take with or immediately following a meal. 02/04/23 09/17/23  Camnitz, Soyla Lunger, MD  midodrine  (PROAMATINE ) 2.5 MG tablet Take 1 tablet (2.5 mg total) by mouth 3 (three) times daily with meals as needed (SBP less than 100). 12/02/22   Regalado, Belkys A, MD  Multiple Vitamin (MULTIVITAMIN) tablet Take 1 tablet by mouth in the morning. Seniors    [provider]  Multiple Vitamins-Minerals (ICAPS AREDS 2 PO) Take 1 capsule by mouth daily.    [provider]  nitroGLYCERIN  (NITROSTAT ) 0.4 MG SL tablet DISSOLVE 1 TABLET UNDER THE TONGUE AS NEEDED FOR CHEST PAIN EVERY 5 MINUTES UP TO 3 TIMES. IF NO RELIEF CALL 911. 04/28/23   Ladona Heinz, MD  OMEGA-3 FATTY ACIDS  PO Take 1,200 mg by mouth in the morning and at bedtime.    [provider]  pantoprazole  (PROTONIX ) 40 MG tablet Take 1 tablet (40 mg total) by mouth daily. 03/17/16   Elgergawy, Brayton RAMAN, MD   Polyethyl Glycol-Propyl Glycol (SYSTANE) 0.4-0.3 % GEL ophthalmic gel Place 1 application into both eyes at bedtime.    [provider]  Propylene Glycol (SYSTANE BALANCE) 0.6 % SOLN Place 1 drop into both eyes 3 (three) times daily as needed (dry/irritated eyes.).    [provider]  pyridoxine  (B-6) 100 MG tablet Take 100 mg by mouth every evening.    [provider]  ranolazine  (RANEXA ) 1000 MG SR tablet Take 1 tablet (1,000 mg total) by mouth daily. 03/06/23   Ladona Heinz, MD  rosuvastatin  (CRESTOR ) 20 MG tablet TAKE 1 TABLET BY MOUTH EVERY DAY AT BEDTIME 12/26/22   Ladona Heinz, MD  sennosides-docusate sodium  (SENOKOT-S) 8.6-50 MG tablet Take 1 tablet by mouth daily as needed for constipation. Patient not taking: Reported on 09/17/2023    [provider]  trimethoprim-polymyxin b (POLYTRIM) ophthalmic solution Place 1 drop into the left eye See admin instructions. Instill 1 drop in left eye four times daily for 2 days after monthly eye injection 02/27/23   [provider]  vitamin C  (ASCORBIC ACID ) 500 MG tablet Take 500 mg by mouth daily.    [provider]    Family History Family History  Problem Relation Age of Onset   Colon cancer Mother 32   Hypertension Mother    Colon cancer Father 61   Heart attack Brother    Heart failure Brother    Stomach cancer Neg Hx     Social History Social History   Tobacco Use   Smoking status: Never    Passive exposure: Never   Smokeless tobacco: Never  Vaping Use   Vaping status: Never Used  Substance Use Topics   Alcohol  use: No   Drug use: No     Allergies   Codeine, Guaifenesin & derivatives, Terbinafine and related, Iodinated contrast media, and Penicillins   Review of Systems Review of Systems  Constitutional:  Negative for chills and fever.  HENT:  Positive for congestion. Negative for ear pain and sore throat.   Eyes:  Negative for pain and visual disturbance.   Respiratory:  Positive for cough. Negative for shortness of breath.   Cardiovascular:  Negative for chest  pain and palpitations.  Gastrointestinal:  Negative for abdominal pain and vomiting.  Genitourinary:  Negative for dysuria and hematuria.  Musculoskeletal:  Negative for arthralgias and back pain.  Skin:  Negative for color change and rash.  Neurological:  Negative for seizures and syncope.  All other systems reviewed and are negative.    Physical Exam Triage Vital Signs ED Triage Vitals  Encounter Vitals Group     BP 09/19/23 1142 134/64     Girls Systolic BP Percentile --      Girls Diastolic BP Percentile --      Boys Systolic BP Percentile --      Boys Diastolic BP Percentile --      Pulse Rate 09/19/23 1142 (!) 57     Resp 09/19/23 1142 (!) 22     Temp 09/19/23 1142 98.1 F (36.7 C)     Temp Source 09/19/23 1142 Oral     SpO2 09/19/23 1142 98 %     Weight 09/19/23 1141 184 lb 15.5 oz (83.9 kg)     Height --      Head Circumference --      Peak Flow --      Pain Score 09/19/23 1140 8     Pain Loc --      Pain Education --      Exclude from Growth Chart --    No data found.  Updated Vital Signs BP 134/64 (BP Location: Right Arm)   Pulse (!) 57   Temp 98.1 F (36.7 C) (Oral)   Resp (!) 22   Wt 184 lb 15.5 oz (83.9 kg)   SpO2 98%   BMI 28.97 kg/m   Visual Acuity Right Eye Distance:   Left Eye Distance:   Bilateral Distance:    Right Eye Near:   Left Eye Near:    Bilateral Near:     Physical Exam Vitals and nursing note reviewed.  Constitutional:      General: He is not in acute distress.    Appearance: He is well-developed.  HENT:     Head: Normocephalic and atraumatic.  Eyes:     Conjunctiva/sclera: Conjunctivae normal.  Cardiovascular:     Rate and Rhythm: Normal rate and regular rhythm.     Heart sounds: No murmur heard. Pulmonary:     Effort: Pulmonary effort is normal. No respiratory distress.     Breath sounds: Normal breath sounds.   Abdominal:     Palpations: Abdomen is soft.     Tenderness: There is no abdominal tenderness.  Musculoskeletal:        General: No swelling.     Cervical back: Neck supple.  Skin:    General: Skin is warm and dry.     Capillary Refill: Capillary refill takes less than 2 seconds.  Neurological:     Mental Status: He is alert.  Psychiatric:        Mood and Affect: Mood normal.      UC Treatments / Results  Labs (all labs ordered are listed, but only abnormal results are displayed) Labs Reviewed - No data to display  EKG   Radiology No results found.  Procedures Procedures (including critical care time)  Medications Ordered in UC Medications - No data to display  Initial Impression / Assessment and Plan / UC Course  I have reviewed the triage vital signs and the nursing notes.  Pertinent labs & imaging results that were available during my care of the patient were reviewed by  me and considered in my medical decision making (see chart for details).     Antibiotic prescribed.  Lungs clear to auscultation on exam.  Vitals within normal limits.  Tessalon  prescribed as well.  Advised to continue with albuterol  nebulizer as needed.  Continue with Coricidin as needed.  Return precautions discussed. Final Clinical Impressions(s) / UC Diagnoses   Final diagnoses:  Acute bronchitis, unspecified organism     Discharge Instructions      Take antibiotic as prescribed. Can take Tessalon  as needed for cough. Recommend continuation of Coricidin. Can continue with albuterol  nebulizer as needed. If no improvement or symptoms become worse please return for evaluation.   ED Prescriptions     Medication Sig Dispense Auth. Provider   azithromycin  (ZITHROMAX  Z-PAK) 250 MG tablet Take 1 tablet (250 mg total) by mouth daily for 5 days. 6 tablet Ward, Calvina Liptak Z, PA-C   benzonatate  (TESSALON ) 100 MG capsule Take 1 capsule (100 mg total) by mouth every 8 (eight) hours. 21 capsule  Ward, Raybon Conard Z, PA-C      PDMP not reviewed this encounter.   Ward, Harlene PEDLAR, PA-C 09/19/23 1219

## 2023-09-19 NOTE — Discharge Instructions (Signed)
 Take antibiotic as prescribed. Can take Tessalon  as needed for cough. Recommend continuation of Coricidin. Can continue with albuterol  nebulizer as needed. If no improvement or symptoms become worse please return for evaluation.

## 2023-09-20 ENCOUNTER — Ambulatory Visit: Payer: Self-pay

## 2023-10-01 ENCOUNTER — Encounter (INDEPENDENT_AMBULATORY_CARE_PROVIDER_SITE_OTHER): Admitting: Ophthalmology

## 2023-10-14 DIAGNOSIS — M25552 Pain in left hip: Secondary | ICD-10-CM | POA: Diagnosis not present

## 2023-10-16 ENCOUNTER — Encounter (INDEPENDENT_AMBULATORY_CARE_PROVIDER_SITE_OTHER): Admitting: Ophthalmology

## 2023-10-16 DIAGNOSIS — H35033 Hypertensive retinopathy, bilateral: Secondary | ICD-10-CM | POA: Diagnosis not present

## 2023-10-16 DIAGNOSIS — H34832 Tributary (branch) retinal vein occlusion, left eye, with macular edema: Secondary | ICD-10-CM

## 2023-10-16 DIAGNOSIS — I10 Essential (primary) hypertension: Secondary | ICD-10-CM

## 2023-10-16 DIAGNOSIS — H43813 Vitreous degeneration, bilateral: Secondary | ICD-10-CM | POA: Diagnosis not present

## 2023-11-02 ENCOUNTER — Telehealth: Payer: Self-pay | Admitting: Cardiology

## 2023-11-02 DIAGNOSIS — I25118 Atherosclerotic heart disease of native coronary artery with other forms of angina pectoris: Secondary | ICD-10-CM

## 2023-11-02 MED ORDER — NITROGLYCERIN 0.4 MG SL SUBL: SUBLINGUAL_TABLET | SUBLINGUAL | 10 refills | Status: AC

## 2023-11-02 NOTE — Telephone Encounter (Signed)
 Pt's medication was sent to pt's pharmacy as requested. Confirmation received.

## 2023-11-02 NOTE — Telephone Encounter (Signed)
*  STAT* If patient is at the pharmacy, call can be transferred to refill team.   1. Which medications need to be refilled? (please list name of each medication and dose if known) nitroGLYCERIN  (NITROSTAT ) 0.4 MG SL tablet    2. Would you like to learn more about the convenience, safety, & potential cost savings by using the [facility]? No   3. Are you open to using the [facility] (Type [facility]. ). No   4. Which pharmacy/location (including street and city if local pharmacy) is medication to be sent to? [id number] - [place], [place] - [address]    5. Do they need a 30 day or 90 day supply? 1 bottle

## 2023-11-05 ENCOUNTER — Other Ambulatory Visit: Payer: Self-pay | Admitting: Cardiology

## 2023-11-05 DIAGNOSIS — I10 Essential (primary) hypertension: Secondary | ICD-10-CM

## 2023-11-13 DIAGNOSIS — E291 Testicular hypofunction: Secondary | ICD-10-CM | POA: Diagnosis not present

## 2023-11-13 DIAGNOSIS — E871 Hypo-osmolality and hyponatremia: Secondary | ICD-10-CM | POA: Diagnosis not present

## 2023-11-13 DIAGNOSIS — E2740 Unspecified adrenocortical insufficiency: Secondary | ICD-10-CM | POA: Diagnosis not present

## 2023-11-13 DIAGNOSIS — E1129 Type 2 diabetes mellitus with other diabetic kidney complication: Secondary | ICD-10-CM | POA: Diagnosis not present

## 2023-11-13 DIAGNOSIS — I129 Hypertensive chronic kidney disease with stage 1 through stage 4 chronic kidney disease, or unspecified chronic kidney disease: Secondary | ICD-10-CM | POA: Diagnosis not present

## 2023-11-13 DIAGNOSIS — N1831 Chronic kidney disease, stage 3a: Secondary | ICD-10-CM | POA: Diagnosis not present

## 2023-11-13 DIAGNOSIS — R7302 Impaired glucose tolerance (oral): Secondary | ICD-10-CM | POA: Diagnosis not present

## 2023-11-20 ENCOUNTER — Encounter (INDEPENDENT_AMBULATORY_CARE_PROVIDER_SITE_OTHER): Admitting: Ophthalmology

## 2023-11-20 DIAGNOSIS — I10 Essential (primary) hypertension: Secondary | ICD-10-CM

## 2023-11-20 DIAGNOSIS — H43813 Vitreous degeneration, bilateral: Secondary | ICD-10-CM

## 2023-11-20 DIAGNOSIS — H34832 Tributary (branch) retinal vein occlusion, left eye, with macular edema: Secondary | ICD-10-CM

## 2023-11-20 DIAGNOSIS — H35033 Hypertensive retinopathy, bilateral: Secondary | ICD-10-CM | POA: Diagnosis not present

## 2023-11-25 ENCOUNTER — Other Ambulatory Visit: Payer: Self-pay | Admitting: Cardiology

## 2023-12-16 DIAGNOSIS — I209 Angina pectoris, unspecified: Secondary | ICD-10-CM | POA: Diagnosis not present

## 2023-12-16 DIAGNOSIS — E291 Testicular hypofunction: Secondary | ICD-10-CM | POA: Diagnosis not present

## 2023-12-16 DIAGNOSIS — E7800 Pure hypercholesterolemia, unspecified: Secondary | ICD-10-CM | POA: Diagnosis not present

## 2023-12-16 DIAGNOSIS — N401 Enlarged prostate with lower urinary tract symptoms: Secondary | ICD-10-CM | POA: Diagnosis not present

## 2023-12-16 DIAGNOSIS — E663 Overweight: Secondary | ICD-10-CM | POA: Diagnosis not present

## 2023-12-16 DIAGNOSIS — N1831 Chronic kidney disease, stage 3a: Secondary | ICD-10-CM | POA: Diagnosis not present

## 2023-12-16 DIAGNOSIS — I129 Hypertensive chronic kidney disease with stage 1 through stage 4 chronic kidney disease, or unspecified chronic kidney disease: Secondary | ICD-10-CM | POA: Diagnosis not present

## 2023-12-16 DIAGNOSIS — H9190 Unspecified hearing loss, unspecified ear: Secondary | ICD-10-CM | POA: Diagnosis not present

## 2023-12-16 DIAGNOSIS — D692 Other nonthrombocytopenic purpura: Secondary | ICD-10-CM | POA: Diagnosis not present

## 2023-12-16 DIAGNOSIS — E2740 Unspecified adrenocortical insufficiency: Secondary | ICD-10-CM | POA: Diagnosis not present

## 2023-12-16 DIAGNOSIS — M5416 Radiculopathy, lumbar region: Secondary | ICD-10-CM | POA: Diagnosis not present

## 2023-12-16 DIAGNOSIS — M5116 Intervertebral disc disorders with radiculopathy, lumbar region: Secondary | ICD-10-CM | POA: Diagnosis not present

## 2023-12-18 ENCOUNTER — Encounter (INDEPENDENT_AMBULATORY_CARE_PROVIDER_SITE_OTHER): Admitting: Ophthalmology

## 2023-12-18 DIAGNOSIS — H43813 Vitreous degeneration, bilateral: Secondary | ICD-10-CM

## 2023-12-18 DIAGNOSIS — H35033 Hypertensive retinopathy, bilateral: Secondary | ICD-10-CM | POA: Diagnosis not present

## 2023-12-18 DIAGNOSIS — I10 Essential (primary) hypertension: Secondary | ICD-10-CM

## 2023-12-18 DIAGNOSIS — H34832 Tributary (branch) retinal vein occlusion, left eye, with macular edema: Secondary | ICD-10-CM

## 2023-12-22 ENCOUNTER — Other Ambulatory Visit: Payer: Self-pay | Admitting: Cardiology

## 2023-12-22 DIAGNOSIS — E782 Mixed hyperlipidemia: Secondary | ICD-10-CM

## 2024-01-10 ENCOUNTER — Other Ambulatory Visit: Payer: Self-pay | Admitting: Cardiology

## 2024-01-15 ENCOUNTER — Encounter (INDEPENDENT_AMBULATORY_CARE_PROVIDER_SITE_OTHER): Admitting: Ophthalmology

## 2024-01-15 DIAGNOSIS — I10 Essential (primary) hypertension: Secondary | ICD-10-CM

## 2024-01-15 DIAGNOSIS — H35033 Hypertensive retinopathy, bilateral: Secondary | ICD-10-CM

## 2024-01-15 DIAGNOSIS — H43813 Vitreous degeneration, bilateral: Secondary | ICD-10-CM

## 2024-01-15 DIAGNOSIS — H34832 Tributary (branch) retinal vein occlusion, left eye, with macular edema: Secondary | ICD-10-CM | POA: Diagnosis not present

## 2024-02-08 DIAGNOSIS — M79671 Pain in right foot: Secondary | ICD-10-CM | POA: Diagnosis not present

## 2024-02-08 DIAGNOSIS — M1711 Unilateral primary osteoarthritis, right knee: Secondary | ICD-10-CM | POA: Diagnosis not present

## 2024-02-08 DIAGNOSIS — M545 Low back pain, unspecified: Secondary | ICD-10-CM | POA: Diagnosis not present

## 2024-02-11 DIAGNOSIS — M7062 Trochanteric bursitis, left hip: Secondary | ICD-10-CM | POA: Diagnosis not present

## 2024-02-11 DIAGNOSIS — M47816 Spondylosis without myelopathy or radiculopathy, lumbar region: Secondary | ICD-10-CM | POA: Diagnosis not present

## 2024-02-12 ENCOUNTER — Encounter (INDEPENDENT_AMBULATORY_CARE_PROVIDER_SITE_OTHER): Admitting: Ophthalmology

## 2024-02-12 DIAGNOSIS — H43813 Vitreous degeneration, bilateral: Secondary | ICD-10-CM

## 2024-02-12 DIAGNOSIS — H35033 Hypertensive retinopathy, bilateral: Secondary | ICD-10-CM

## 2024-02-12 DIAGNOSIS — H34832 Tributary (branch) retinal vein occlusion, left eye, with macular edema: Secondary | ICD-10-CM | POA: Diagnosis not present

## 2024-02-12 DIAGNOSIS — I10 Essential (primary) hypertension: Secondary | ICD-10-CM

## 2024-02-24 ENCOUNTER — Telehealth: Payer: Self-pay | Admitting: Cardiology

## 2024-02-24 DIAGNOSIS — I10 Essential (primary) hypertension: Secondary | ICD-10-CM

## 2024-02-24 NOTE — Telephone Encounter (Signed)
 Patient reports BP has been up and down, going as low as 70's for SBP and as high as 185. He reports SBP is mostly elevated 150-180's. He takes hydralazine  PRN for SBP >140.  He has not needed to take the midodrine  lately. He states he does have headaches when BP is elevated. BP is elevated mostly at night around bedtime. He denies increased salt in diet.  Scheduled next available appt with APP for 03/08/24. Will forward to Dr. Ladona to review for further advisement.

## 2024-02-24 NOTE — Telephone Encounter (Signed)
 Pt c/o BP issue: STAT if pt c/o blurred vision, one-sided weakness or slurred speech.  STAT if BP is GREATER than 180/120 TODAY.  STAT if BP is LESS than 90/60 and SYMPTOMATIC TODAY  1. What is your BP concern? Elevated then gets low  2. Have you taken any BP medication today?  yes  3. What are your last 5 BP readings?   no 4. Are you having any other symptoms (ex. Dizziness, headache, blurred vision, passed out)? Dizziness and headache.   Pt requesting an appt.

## 2024-02-24 NOTE — Telephone Encounter (Signed)
 Patient has orthostatic hypotension and supine hypertension.  Best option is to take hydralazine  before he goes to bed and see how he does

## 2024-02-25 MED ORDER — HYDRALAZINE HCL 25 MG PO TABS: 25.0000 mg | ORAL_TABLET | Freq: Three times a day (TID) | ORAL | 1 refills | Status: DC

## 2024-02-25 NOTE — Telephone Encounter (Signed)
 Followed up with pt, given MD advisement. Pt reports he is taking Hydralazine  2-3 times per day.  States he takes it almost everyday d/t hypertension. Will forward back to MD for review/new advisement.

## 2024-02-25 NOTE — Telephone Encounter (Signed)
 Pt and wife informed of MD recommendation.  Updated Rx send to pharmacy. Pt agreeable to plan.

## 2024-02-25 NOTE — Telephone Encounter (Signed)
 Can change this to TID and PRN for BP > 150 mm Hg

## 2024-02-25 NOTE — Telephone Encounter (Signed)
 Left message to call back.

## 2024-02-25 NOTE — Telephone Encounter (Signed)
Wife is returning call. Please advise

## 2024-03-08 ENCOUNTER — Ambulatory Visit: Admitting: Cardiology

## 2024-03-09 ENCOUNTER — Other Ambulatory Visit: Payer: Self-pay | Admitting: Cardiology

## 2024-03-09 DIAGNOSIS — I25118 Atherosclerotic heart disease of native coronary artery with other forms of angina pectoris: Secondary | ICD-10-CM

## 2024-03-11 ENCOUNTER — Encounter (INDEPENDENT_AMBULATORY_CARE_PROVIDER_SITE_OTHER): Admitting: Ophthalmology

## 2024-03-11 DIAGNOSIS — H43813 Vitreous degeneration, bilateral: Secondary | ICD-10-CM | POA: Diagnosis not present

## 2024-03-11 DIAGNOSIS — H35033 Hypertensive retinopathy, bilateral: Secondary | ICD-10-CM | POA: Diagnosis not present

## 2024-03-11 DIAGNOSIS — H34832 Tributary (branch) retinal vein occlusion, left eye, with macular edema: Secondary | ICD-10-CM

## 2024-03-11 DIAGNOSIS — I10 Essential (primary) hypertension: Secondary | ICD-10-CM | POA: Diagnosis not present

## 2024-03-25 ENCOUNTER — Telehealth: Payer: Self-pay

## 2024-03-25 NOTE — Telephone Encounter (Signed)
"  ° °  Pre-operative Risk Assessment    Patient Name: [name]  DOB: September 07, 1945 MRN: [id number]   Date of last office visit: 09/17/2023, Dr. Gordy Bergamo, MD Date of next office visit: NONE   Request for Surgical Clearance    Procedure:  LT L5-S1 IL ESI  Date of Surgery:  Clearance TBD                                Surgeon: Dr. Darryle Running, MD Surgeon's Group or Practice Name: [facility]  Phone number: [phone] Fax number: [phone]   Type of Clearance Requested:   - Medical  - Pharmacy:  Hold Aspirin      Type of Anesthesia:  Not Indicated   Additional requests/questions:    SignedAsberry KANDICE Dunning   03/25/2024, 12:07 PM   "

## 2024-03-25 NOTE — Telephone Encounter (Signed)
" ° °  Name: [name]  DOB: May 22, 1945  MRN: [id number]  Primary Cardiologist: Gordy Bergamo, MD   Preoperative team, please contact this patient and set up a phone call appointment for further preoperative risk assessment. Please obtain consent and complete medication review. Thank you for your help.  I confirm that guidance regarding antiplatelet and oral anticoagulation therapy has been completed and, if necessary, noted below: - Aspirin  can be held.  I also confirmed the patient resides in the state of [place] . As per [facility] Medical Board telemedicine laws, the patient must reside in the state in which the provider is licensed.   Robi Mitter E Essa Wenk, PA-C 03/25/2024, 3:21 PM [facility]    "

## 2024-03-28 NOTE — Telephone Encounter (Signed)
 I was speaking with the pt about scheduling a tele proep appt when pt tells me that he feels he needs to be seen in the office.   Pt states he has been having SOB, lightheadedness when he bends over, BP fluctuating, especially at night per pt; one BP reading last week was 190/80. Pt states HR has been fine. In review if the schedules I could not get pt in with DR. Ganji, per pt request he would like to see Dr. Ladona. Pt has agreed to appt at this time 04/04/24 Pedro Bane, NP, unless we can get him in sooner with Dr. Ladona. I assured pt that I will reach out to the Dr. Godfrey nurse/CMA.   I will also update the preop APP as it was stated to schedule tele but pt said he feels he needs to be seen in the office.    I will also have a triage RN call the pt due to at least one BP reading Systolic 190 and lightheadedness.

## 2024-04-01 ENCOUNTER — Encounter (HOSPITAL_BASED_OUTPATIENT_CLINIC_OR_DEPARTMENT_OTHER): Payer: Self-pay | Admitting: Nurse Practitioner

## 2024-04-01 NOTE — Telephone Encounter (Signed)
 I s/w the pt and his wife and to see about getting a sooner appt as his sxms are needing to be assessed and should not wait until the 07/11/24 appt that he was scheduled. In my conversation I was going to schedule him with Rollo Louder, [facility] 04/12/24 @ 2:45. When  I called the pt back he and his wife tell me the triage nurse just scheduled him 04/08/24 with Dr. Ladona. I said that is perfect. They both thanked us  all for all of our hard work.

## 2024-04-01 NOTE — Telephone Encounter (Signed)
Patient is requesting a call back. Please advise

## 2024-04-01 NOTE — Telephone Encounter (Signed)
 Preop appt cancelled due to incoming inclement weather. Appt rescheduled for May.  Offered appt with Dr. Ganji for 04/08/24 at 11:20 AM for evaluation of symptoms and preop clearance.  Patient accepted appt time. No further needs voiced at this time.

## 2024-04-04 ENCOUNTER — Ambulatory Visit (HOSPITAL_BASED_OUTPATIENT_CLINIC_OR_DEPARTMENT_OTHER): Payer: Self-pay | Admitting: Nurse Practitioner

## 2024-04-07 NOTE — Progress Notes (Signed)
 " Cardiology Office Note:  .   Date:  04/08/2024  ID:  GRIFFON [name], DOB Sep 21, 1945, MRN [id number] PCP: Vernadine Charlie ORN, MD  [facility] Providers Cardiologist:  Gordy Bergamo, MD   History of Present Illness: .   [name] is a 79 y.o. Caucasian male patient with coronary artery SP CABG in 1996, chronic stable angina, angioplasty to distal LM and ostial and proximal CX on 12/24/2021 and repeat Cutting Balloon angioplasty for restenosis on 11/27/2022, has a patent LIMA to LAD and free radial graft with the ostial stent to D1 and OM-1.   PMH significant for mild aortic stenosis, mixed hyperlipidemia, markedly elevated Lp(a) at 216 on 12/25/2021, GERD, chronic back pain and spinal stenosis, recurrent syncope and dizziness due to orthostatic hypotension, underwent loop recorder implantation on 04/09/2021 and explanted on 07/31/2023 as his syncopal episodes were felt to be due to orthostasis.   All his antihypertensive medications were discontinued now on steroids due to idiopathic adrenal insufficiency .  He made an appointment to see me in view of gradually worsening dyspnea and marked fluctuation in his blood pressure.  He continues to have severe periodic episodes of dizziness and has fallen about 2-3 times since I last saw him 6 months ago.    Discussed the use of AI scribe software for clinical note transcription with the patient, who gave verbal consent to proceed.  History of Present Illness [name] is a 79 year old male who presents with weakness and balance issues. He was referred by Dr. Josefina for evaluation of his hip and back pain.  He reports a rapid decline in activity from being very active to needing a cane for ambulation because of weakness and balance problems. He has been in balance physical therapy for the past 2 to 3 weeks.  He has chronic hip and back pain and was told by a spine specialist that he has disc disease and stenosis. He becomes dizzy easily,  which worsens with blood pressure changes.  He has orthostatic hypotension, with blood pressure dropping from 160 sitting to 120 standing, accompanied by dizziness and shortness of breath. His blood pressure is hard to control, with high readings at night and low readings during the day.  He has recently gained weight due to decreased physical activity and is concerned this is worsening his joint and back pain and limiting mobility.  He takes metoprolol  succinate 50 mg once daily, hydrocortisone  10 mg and 5 mg for adrenal insufficiency, and hydralazine  as needed for blood pressure, averaging about two doses per day.  Cardiac Studies relevent.    Left Heart Catheterization 11/25/22:    Has a high-grade 95% in-stent restenosis at the previously placed 2.5 x 24 mm Synergy XD on 12/14/2021.  Successful but complex and difficult PCI with [facility] cutting balloon angioplasty     MYOCARDIAL PERFUSION IMAGING 06/02/2023  Normal perfusion without evidence of ischemia, EF 51%.  ECHOCARDIOGRAM COMPLETE 11/30/2022  Normal LV systolic function, EF 60 to 65% without wall motion abnormality.  Mild LVH. Mild aortic valve stenosis, aortic valve area 1.49 cm.  With a mean gradient of 11 mmHg.  EKG:   EKG Interpretation Date/Time:  Friday April 08 2024 10:56:37 EST Ventricular Rate:  56 PR Interval:  152 QRS Duration:  126 QT Interval:  420 QTC Calculation: 405 R Axis:   -37  Text Interpretation: EKG 04/08/2024: Sinus bradycardia at rate of 56 bpm, left intrafascicular block.  IVCD, LVH.  Compared to  07/31/2023, no change. Confirmed by Ladona Heinz [phone]) on 04/08/2024 11:52:12 AM  Labs   Lab Results  Component Value Date   NA 137 03/27/2023   K 3.7 03/27/2023   CO2 22 03/27/2023   GLUCOSE 153 (H) 03/27/2023   BUN 16 03/27/2023   CREATININE 1.15 03/27/2023   CALCIUM  8.7 (L) 03/27/2023   EGFR 51 (L) 02/03/2023   GFRNONAA >60 03/27/2023    Lab Results  Component Value Date   ALT 18  03/24/2023   AST 30 03/24/2023   ALKPHOS 30 (L) 03/24/2023   BILITOT 0.9 03/24/2023      Latest Ref Rng & Units 03/27/2023    3:27 AM 03/26/2023    3:12 AM 03/25/2023   12:31 PM  CBC  WBC 4.0 - 10.5 K/uL 4.4  3.5  3.8   Hemoglobin 13.0 - 17.0 g/dL 88.3  89.6  88.1   Hematocrit 39.0 - 52.0 % 34.3  31.0  35.7   Platelets 150 - 400 K/uL 140  147  166    No results found for: HGBA1C  Lab Results  Component Value Date   TSH 1.905 11/30/2022    Care everywhere/Faxed External Labs:  Labs 12/13/2023:  Plasma ACTH  reduced at 5.9 (7.2-63.3).  Confirmed by lab on 06/17/2023.  Labs 06/02/2023: Testosterone  moderately reduced at 222 (264-916.  Free T reduced at 1.2 (6.6-18.1.  Labs 03/27/2023:  Sodium 137, potassium 3.7, BUN 16, creatinine 1.15, eGFR 60 mL serum glucose 153.  Hb 11.6/HCT 34.3, platelets 140  ROS  Review of Systems  Cardiovascular:  Positive for dyspnea on exertion. Negative for chest pain, leg swelling and syncope.  Musculoskeletal:  Positive for arthritis, back pain and muscle weakness (bilateral legs).  Neurological:  Positive for dizziness.   Physical Exam:   VS:  BP (!) 174/68 (BP Location: Left Arm, Patient Position: Sitting, Cuff Size: Normal)   Pulse (!) 56   Ht 5' 7 (1.702 m)   Wt 191 lb (86.6 kg)   SpO2 99%   BMI 29.91 kg/m    Wt Readings from Last 3 Encounters:  04/08/24 191 lb (86.6 kg)  09/19/23 184 lb 15.5 oz (83.9 kg)  09/17/23 185 lb (83.9 kg)    BP Readings from Last 3 Encounters:  04/08/24 (!) 174/68  09/19/23 134/64  09/17/23 (!) 132/58   Physical Exam Neck:     Vascular: No carotid bruit or JVD.  Cardiovascular:     Rate and Rhythm: Normal rate and regular rhythm.     Pulses: Intact distal pulses.     Heart sounds: S1 normal and S2 normal. Murmur heard.     Early systolic murmur is present with a grade of 2/6 at the upper right sternal border.     No gallop.  Pulmonary:     Effort: Pulmonary effort is normal.     Breath sounds:  Normal breath sounds.  Abdominal:     General: Bowel sounds are normal.     Palpations: Abdomen is soft.  Musculoskeletal:     Right lower leg: No edema.     Left lower leg: No edema.     ASSESSMENT AND PLAN: .      ICD-10-CM   1. Orthostatic hypotension  I95.1     2. Supine hypertension  I10 diltiazem  (CARDIZEM  CD) 240 MG 24 hr capsule    hydrALAZINE  (APRESOLINE ) 25 MG tablet    DISCONTINUED: hydrALAZINE  (APRESOLINE ) 25 MG tablet    3. Adrenal insufficiency  E27.40  4. SOB (shortness of breath)  R06.02 EKG 12-Lead    5. Coronary artery disease of native artery of native heart with stable angina pectoris  I25.118 EKG 12-Lead     Assessment & Plan Supine hypertension and orthostatic hypotension Experiencing supine hypertension with elevated blood pressure when supine and orthostatic hypotension with significant blood pressure drop upon standing. Recent weight gain and decreased physical activity contribute to these issues. Current management includes metoprolol , which is being discontinued due to its interaction with adrenal insufficiency. Diltiazem  is introduced to manage supine hypertension. Hydralazine  is used as needed for blood pressure control. - Discontinued metoprolol  due to interaction with adrenal insufficiency. - Initiated diltiazem  240 mg at night to manage supine hypertension. - Instructed to take hydralazine  25 mg, one tablet in the morning and two tablets in the evening, with an additional tablet if blood pressure exceeds 150 mmHg. - Advised sleeping reclined to manage supine hypertension. - Educated on the importance of not gaining weight and avoiding snacks to prevent exacerbation of blood pressure issues.  Adrenal insufficiency Managed with hydrocortisone . Current regimen includes hydrocortisone  10 mg and 5 mg daily. Metoprolol  is discontinued due to its interaction with adrenal insufficiency. - Continue hydrocortisone  10 mg and 5 mg daily.  Coronary artery  disease with stable angina Coronary artery disease with stable angina. Current management includes ranolazine  and rosuvastatin . Shortness of breath is likely multifactorial, related to weight gain, decreased physical activity, and blood pressure issues. - Continue rosuvastatin  20 mg daily.  Obesity Recent weight gain due to decreased physical activity and increased caloric intake. Weight gain contributes to shortness of breath, joint pain, and blood pressure issues. - Advised on dietary modifications, including avoiding snacks, sugared iced tea, and sodas. - Encouraged consumption of raw vegetables with minimal dressing. - Emphasized the importance of weight loss to improve overall health and reduce cardiovascular risk.  Follow up: 3 months Orthostatic hypotension please check orthostatics   Signed,  Gordy Bergamo, MD, [facility] 04/08/2024, 12:37 PM [facility] [address] [place], [place] [address] Phone: [phone]. Fax:  [phone]  "

## 2024-04-08 ENCOUNTER — Encounter: Payer: Self-pay | Admitting: Cardiology

## 2024-04-08 ENCOUNTER — Other Ambulatory Visit (HOSPITAL_COMMUNITY): Payer: Self-pay

## 2024-04-08 ENCOUNTER — Encounter (INDEPENDENT_AMBULATORY_CARE_PROVIDER_SITE_OTHER): Admitting: Ophthalmology

## 2024-04-08 ENCOUNTER — Ambulatory Visit: Payer: Self-pay | Attending: Cardiology | Admitting: Cardiology

## 2024-04-08 VITALS — BP 174/68 | HR 56 | Ht 67.0 in | Wt 191.0 lb

## 2024-04-08 DIAGNOSIS — I10 Essential (primary) hypertension: Secondary | ICD-10-CM | POA: Diagnosis not present

## 2024-04-08 DIAGNOSIS — I25118 Atherosclerotic heart disease of native coronary artery with other forms of angina pectoris: Secondary | ICD-10-CM

## 2024-04-08 DIAGNOSIS — I951 Orthostatic hypotension: Secondary | ICD-10-CM

## 2024-04-08 DIAGNOSIS — E2740 Unspecified adrenocortical insufficiency: Secondary | ICD-10-CM

## 2024-04-08 DIAGNOSIS — R0602 Shortness of breath: Secondary | ICD-10-CM

## 2024-04-08 DIAGNOSIS — I5032 Chronic diastolic (congestive) heart failure: Secondary | ICD-10-CM

## 2024-04-08 MED ORDER — HYDRALAZINE HCL 25 MG PO TABS: 25.0000 mg | ORAL_TABLET | Freq: Three times a day (TID) | ORAL | Status: DC | PRN

## 2024-04-08 MED ORDER — HYDRALAZINE HCL 25 MG PO TABS: 25.0000 mg | ORAL_TABLET | ORAL | Status: AC

## 2024-04-08 MED ORDER — DILTIAZEM HCL ER COATED BEADS 240 MG PO CP24
240.0000 mg | ORAL_CAPSULE | Freq: Every evening | ORAL | 3 refills | Status: AC
  Filled 2024-04-08: qty 90, 90d supply, fill #0

## 2024-04-08 NOTE — Patient Instructions (Addendum)
 Medication Instructions:   diltiazem  (CARDIZEM  CD) 240 MG 24 hr capsule    Sig: Take 1 capsule (240 mg total) by mouth every evening.    Dispense:  90 capsule    Refill:  3          hydrALAZINE  (APRESOLINE ) 25 MG tablet    Sig: Take 1 tablet (25 mg total) by mouth as directed. One tab in morning and two tab in evening. If needed, can take 1 extra tablet if BP higher than 150    *If you need a refill on your cardiac medications before your next appointment, please call your pharmacy*   Follow-Up: At [facility], you and your health needs are our priority.  As part of our continuing mission to provide you with exceptional heart care, our providers are all part of one team.  This team includes your primary Cardiologist (physician) and Advanced Practice Providers or APPs (Physician Assistants and Nurse Practitioners) who all work together to provide you with the care you need, when you need it.  Your next appointment:   3 month(s)  Provider:   Gordy Bergamo, MD            We recommend signing up for the patient portal called MyChart.  Patients are able to view lab/test results, encounter notes, upcoming appointments, etc.  Non-urgent messages can be sent to your provider as well, go to [email or web address].

## 2024-04-15 ENCOUNTER — Encounter (INDEPENDENT_AMBULATORY_CARE_PROVIDER_SITE_OTHER): Admitting: Ophthalmology

## 2024-05-13 ENCOUNTER — Encounter (INDEPENDENT_AMBULATORY_CARE_PROVIDER_SITE_OTHER): Admitting: Ophthalmology

## 2024-07-07 ENCOUNTER — Ambulatory Visit: Admitting: Cardiology

## 2024-07-11 ENCOUNTER — Ambulatory Visit (HOSPITAL_BASED_OUTPATIENT_CLINIC_OR_DEPARTMENT_OTHER): Payer: Self-pay | Admitting: Nurse Practitioner
